# Patient Record
Sex: Female | Born: 1960
Health system: Southern US, Community
[De-identification: ages and names within clinical notes are randomized; demographics above are authoritative.]

## PROBLEM LIST (undated history)

## (undated) DIAGNOSIS — R008 Other abnormalities of heart beat: Secondary | ICD-10-CM

## (undated) DIAGNOSIS — J302 Other seasonal allergic rhinitis: Secondary | ICD-10-CM

## (undated) DIAGNOSIS — R232 Flushing: Secondary | ICD-10-CM

## (undated) DIAGNOSIS — L900 Lichen sclerosus et atrophicus: Secondary | ICD-10-CM

## (undated) DIAGNOSIS — Z8489 Family history of other specified conditions: Secondary | ICD-10-CM

## (undated) DIAGNOSIS — Z9109 Other allergy status, other than to drugs and biological substances: Secondary | ICD-10-CM

## (undated) DIAGNOSIS — R112 Nausea with vomiting, unspecified: Secondary | ICD-10-CM

## (undated) DIAGNOSIS — R7303 Prediabetes: Secondary | ICD-10-CM

## (undated) DIAGNOSIS — E663 Overweight: Secondary | ICD-10-CM

## (undated) DIAGNOSIS — Z8719 Personal history of other diseases of the digestive system: Secondary | ICD-10-CM

## (undated) DIAGNOSIS — D179 Benign lipomatous neoplasm, unspecified: Secondary | ICD-10-CM

## (undated) DIAGNOSIS — G43909 Migraine, unspecified, not intractable, without status migrainosus: Secondary | ICD-10-CM

## (undated) DIAGNOSIS — J349 Unspecified disorder of nose and nasal sinuses: Secondary | ICD-10-CM

## (undated) DIAGNOSIS — R51 Headache: Secondary | ICD-10-CM

## (undated) DIAGNOSIS — L921 Necrobiosis lipoidica, not elsewhere classified: Secondary | ICD-10-CM

## (undated) DIAGNOSIS — E559 Vitamin D deficiency, unspecified: Secondary | ICD-10-CM

## (undated) DIAGNOSIS — T148XXA Other injury of unspecified body region, initial encounter: Secondary | ICD-10-CM

## (undated) DIAGNOSIS — I509 Heart failure, unspecified: Secondary | ICD-10-CM

## (undated) DIAGNOSIS — R102 Pelvic and perineal pain: Secondary | ICD-10-CM

## (undated) DIAGNOSIS — I499 Cardiac arrhythmia, unspecified: Secondary | ICD-10-CM

## (undated) DIAGNOSIS — M2550 Pain in unspecified joint: Secondary | ICD-10-CM

## (undated) DIAGNOSIS — H9190 Unspecified hearing loss, unspecified ear: Secondary | ICD-10-CM

## (undated) DIAGNOSIS — Z9889 Other specified postprocedural states: Secondary | ICD-10-CM

## (undated) DIAGNOSIS — M6530 Trigger finger, unspecified finger: Secondary | ICD-10-CM

## (undated) DIAGNOSIS — N920 Excessive and frequent menstruation with regular cycle: Secondary | ICD-10-CM

## (undated) DIAGNOSIS — K760 Fatty (change of) liver, not elsewhere classified: Secondary | ICD-10-CM

## (undated) DIAGNOSIS — M1990 Unspecified osteoarthritis, unspecified site: Secondary | ICD-10-CM

## (undated) HISTORY — PX: HYSTEROSCOPY: SHX211

## (undated) HISTORY — DX: Fatty (change of) liver, not elsewhere classified: K76.0

## (undated) HISTORY — PX: FUNCTIONAL ENDOSCOPIC SINUS SURGERY: SUR616

## (undated) HISTORY — DX: Heart failure, unspecified: I50.9

## (undated) HISTORY — DX: Flushing: R23.2

## (undated) HISTORY — DX: Lichen sclerosus et atrophicus: L90.0

## (undated) HISTORY — DX: Unspecified disorder of nose and nasal sinuses: J34.9

## (undated) HISTORY — DX: Other abnormalities of heart beat: R00.8

## (undated) HISTORY — DX: Pain in unspecified joint: M25.50

## (undated) HISTORY — DX: Vitamin D deficiency, unspecified: E55.9

## (undated) HISTORY — DX: Benign lipomatous neoplasm, unspecified: D17.9

## (undated) HISTORY — PX: TRIGGER FINGER RELEASE: SHX641

## (undated) HISTORY — PX: OTHER SURGICAL HISTORY: SHX169

## (undated) HISTORY — DX: Prediabetes: R73.03

## (undated) HISTORY — DX: Necrobiosis lipoidica, not elsewhere classified: L92.1

## (undated) HISTORY — PX: LASIK: SHX215

## (undated) HISTORY — DX: Trigger finger, unspecified finger: M65.30

## (undated) HISTORY — DX: Unspecified hearing loss, unspecified ear: H91.90

## (undated) HISTORY — DX: Overweight: E66.3

## (undated) HISTORY — DX: Migraine, unspecified, not intractable, without status migrainosus: G43.909

## (undated) HISTORY — PX: CHOLECYSTECTOMY: SHX55

## (undated) HISTORY — PX: APPENDECTOMY: SHX54

## (undated) HISTORY — DX: Unspecified osteoarthritis, unspecified site: M19.90

---

## 1998-01-11 ENCOUNTER — Other Ambulatory Visit: Admission: RE | Admit: 1998-01-11 | Discharge: 1998-01-11 | Payer: Self-pay | Admitting: Obstetrics and Gynecology

## 1999-08-10 ENCOUNTER — Encounter: Payer: Self-pay | Admitting: Obstetrics and Gynecology

## 1999-08-10 ENCOUNTER — Ambulatory Visit (HOSPITAL_COMMUNITY): Admission: RE | Admit: 1999-08-10 | Discharge: 1999-08-10 | Payer: Self-pay | Admitting: Obstetrics and Gynecology

## 1999-12-28 ENCOUNTER — Other Ambulatory Visit: Admission: RE | Admit: 1999-12-28 | Discharge: 1999-12-28 | Payer: Self-pay | Admitting: Obstetrics and Gynecology

## 2000-12-05 ENCOUNTER — Encounter: Payer: Self-pay | Admitting: Obstetrics and Gynecology

## 2000-12-05 ENCOUNTER — Ambulatory Visit (HOSPITAL_COMMUNITY): Admission: RE | Admit: 2000-12-05 | Discharge: 2000-12-05 | Payer: Self-pay | Admitting: Obstetrics and Gynecology

## 2000-12-30 ENCOUNTER — Other Ambulatory Visit: Admission: RE | Admit: 2000-12-30 | Discharge: 2000-12-30 | Payer: Self-pay | Admitting: Obstetrics and Gynecology

## 2001-12-16 ENCOUNTER — Encounter: Payer: Self-pay | Admitting: Obstetrics and Gynecology

## 2001-12-16 ENCOUNTER — Ambulatory Visit (HOSPITAL_COMMUNITY): Admission: RE | Admit: 2001-12-16 | Discharge: 2001-12-16 | Payer: Self-pay | Admitting: Obstetrics and Gynecology

## 2001-12-30 ENCOUNTER — Other Ambulatory Visit: Admission: RE | Admit: 2001-12-30 | Discharge: 2001-12-30 | Payer: Self-pay | Admitting: Obstetrics and Gynecology

## 2002-12-21 ENCOUNTER — Ambulatory Visit (HOSPITAL_COMMUNITY): Admission: RE | Admit: 2002-12-21 | Discharge: 2002-12-21 | Payer: Self-pay | Admitting: Obstetrics and Gynecology

## 2003-01-01 ENCOUNTER — Other Ambulatory Visit: Admission: RE | Admit: 2003-01-01 | Discharge: 2003-01-01 | Payer: Self-pay | Admitting: Obstetrics and Gynecology

## 2003-12-22 ENCOUNTER — Ambulatory Visit (HOSPITAL_COMMUNITY): Admission: RE | Admit: 2003-12-22 | Discharge: 2003-12-22 | Payer: Self-pay | Admitting: Obstetrics and Gynecology

## 2004-01-03 ENCOUNTER — Other Ambulatory Visit: Admission: RE | Admit: 2004-01-03 | Discharge: 2004-01-03 | Payer: Self-pay | Admitting: Obstetrics and Gynecology

## 2004-12-25 ENCOUNTER — Ambulatory Visit (HOSPITAL_COMMUNITY): Admission: RE | Admit: 2004-12-25 | Discharge: 2004-12-25 | Payer: Self-pay | Admitting: Obstetrics and Gynecology

## 2005-01-03 ENCOUNTER — Other Ambulatory Visit: Admission: RE | Admit: 2005-01-03 | Discharge: 2005-01-03 | Payer: Self-pay | Admitting: Obstetrics and Gynecology

## 2005-12-31 ENCOUNTER — Ambulatory Visit (HOSPITAL_COMMUNITY): Admission: RE | Admit: 2005-12-31 | Discharge: 2005-12-31 | Payer: Self-pay | Admitting: Obstetrics and Gynecology

## 2006-01-16 ENCOUNTER — Other Ambulatory Visit: Admission: RE | Admit: 2006-01-16 | Discharge: 2006-01-16 | Payer: Self-pay | Admitting: Obstetrics & Gynecology

## 2006-02-07 ENCOUNTER — Encounter: Admission: RE | Admit: 2006-02-07 | Discharge: 2006-02-07 | Payer: Self-pay | Admitting: Orthopedic Surgery

## 2006-02-07 IMAGING — RF DG FLUORO GUIDE NDL PLC/BX
2 series · 2 of 2 positions shown · IV contrast (gadolinium)
Comparison: none

CLINICAL DATA: Right hip pain.
 FLUOROSCOPICALLY GUIDED NEEDLE PLACEMENT FOR MR ARTHROGRAPHY, RIGHT HIP:
 Following informed consent, sterile preparation of the groin area, and adequate local anesthesia using 1% Lidocaine, a 22 gauge spinal needle was placed into the right femoral neck under direct fluoroscopic guidance.  A [DATE] mixture of gadolinium (Magnevist) along with ADAMSE and saline were injected into the right hip joint.  Post procedure, the patient was comfortable.  
 The patient was escorted to the MR suite in a wheelchair for further imaging.

[Series 1: (hospital) · 1 of 1 slices shown]
[im 1/1]
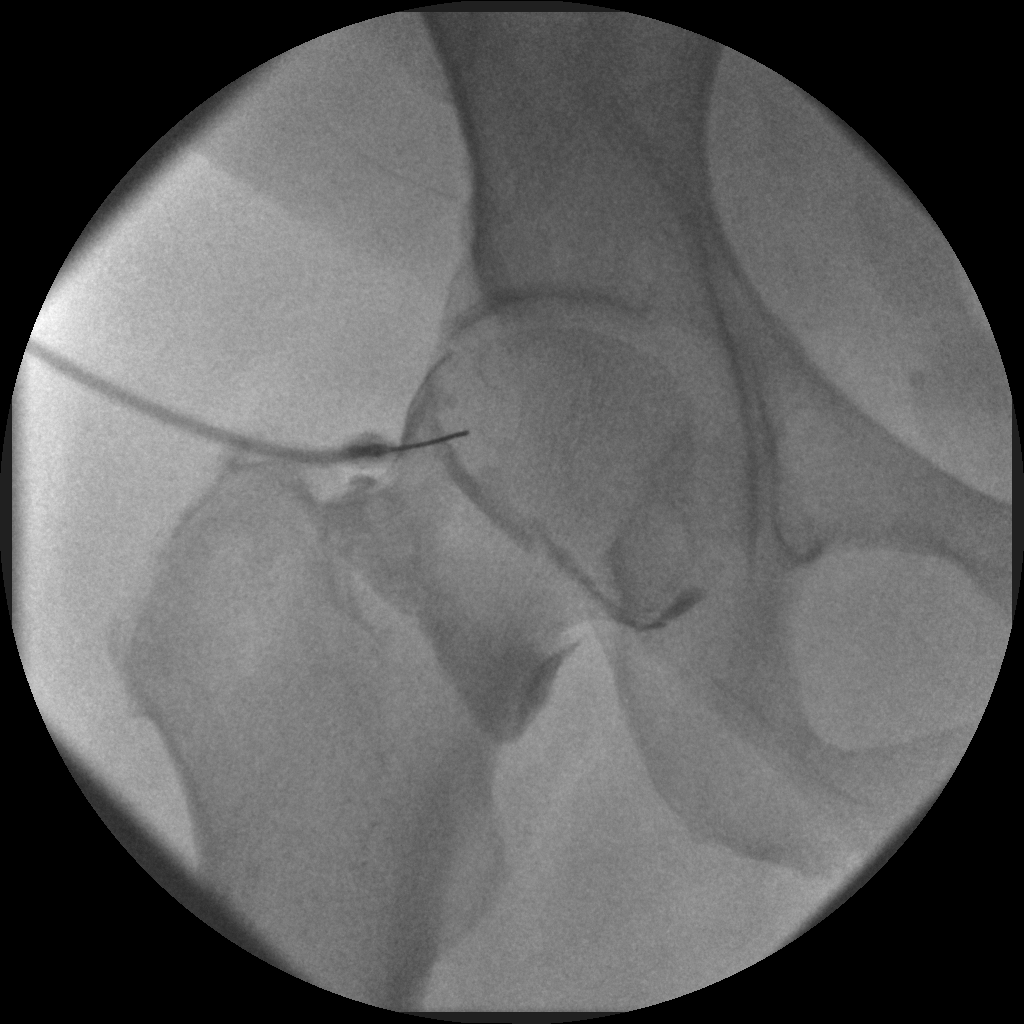

[Series 2: arthrogram · 1 of 1 slices shown]
[im 1/1]
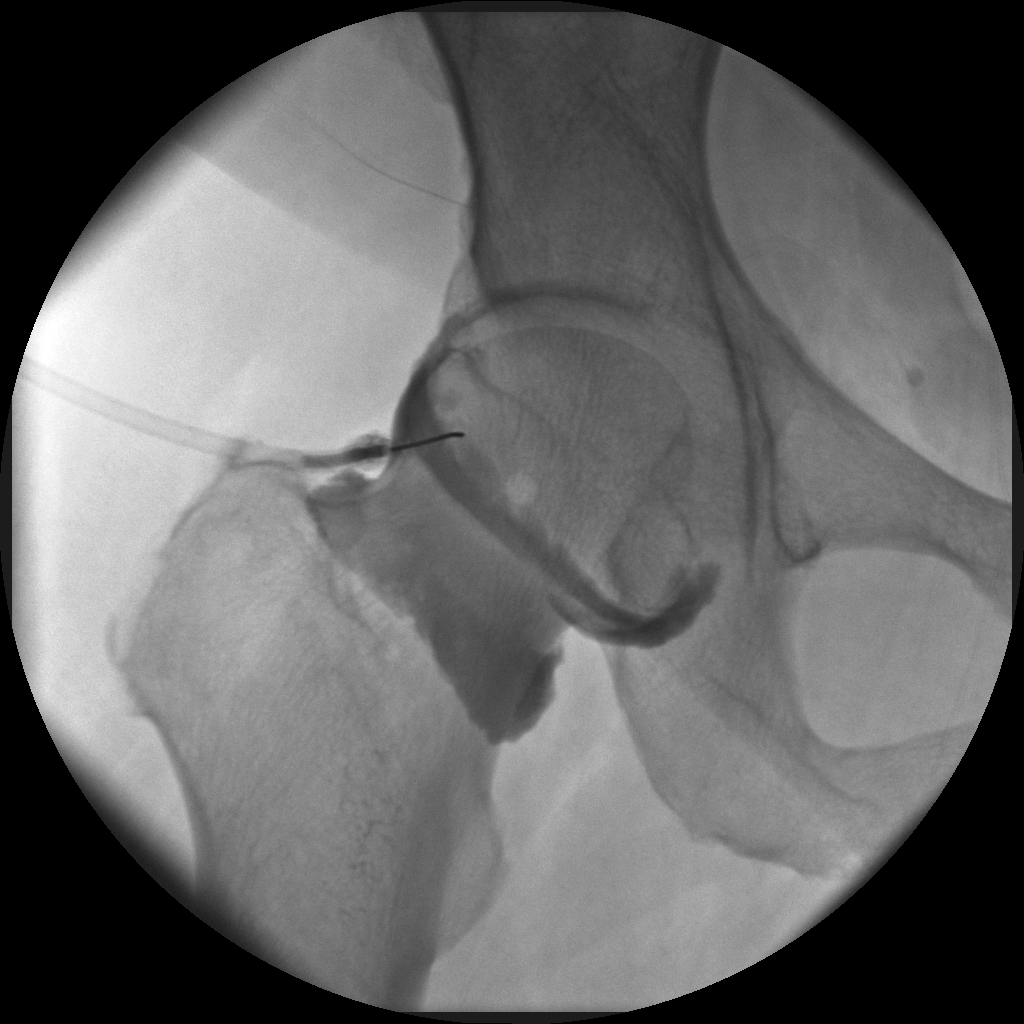

[2 of 2 positions shown; findings below may reference images not displayed]

IMPRESSION: Technically successful fluoroscopically guided needle placement for MR arthrography of the hip  with instillation of a [DATE] dilution of gadolinium.

## 2006-08-13 HISTORY — PX: DIAGNOSTIC LAPAROSCOPY: SUR761

## 2006-08-20 ENCOUNTER — Ambulatory Visit (HOSPITAL_BASED_OUTPATIENT_CLINIC_OR_DEPARTMENT_OTHER): Admission: RE | Admit: 2006-08-20 | Discharge: 2006-08-20 | Payer: Self-pay | Admitting: Obstetrics & Gynecology

## 2007-01-06 ENCOUNTER — Ambulatory Visit (HOSPITAL_COMMUNITY): Admission: RE | Admit: 2007-01-06 | Discharge: 2007-01-06 | Payer: Self-pay | Admitting: Obstetrics and Gynecology

## 2007-01-22 ENCOUNTER — Encounter: Admission: RE | Admit: 2007-01-22 | Discharge: 2007-01-22 | Payer: Self-pay | Admitting: Obstetrics and Gynecology

## 2007-05-08 ENCOUNTER — Other Ambulatory Visit: Admission: RE | Admit: 2007-05-08 | Discharge: 2007-05-08 | Payer: Self-pay | Admitting: Obstetrics and Gynecology

## 2007-07-14 ENCOUNTER — Encounter: Admission: RE | Admit: 2007-07-14 | Discharge: 2007-07-14 | Payer: Self-pay | Admitting: Obstetrics & Gynecology

## 2008-01-30 ENCOUNTER — Encounter: Admission: RE | Admit: 2008-01-30 | Discharge: 2008-01-30 | Payer: Self-pay | Admitting: Obstetrics & Gynecology

## 2008-05-13 ENCOUNTER — Other Ambulatory Visit: Admission: RE | Admit: 2008-05-13 | Discharge: 2008-05-13 | Payer: Self-pay | Admitting: Obstetrics & Gynecology

## 2009-01-31 ENCOUNTER — Encounter: Admission: RE | Admit: 2009-01-31 | Discharge: 2009-01-31 | Payer: Self-pay | Admitting: Obstetrics & Gynecology

## 2009-02-12 HISTORY — PX: DILATION AND CURETTAGE OF UTERUS: SHX78

## 2009-08-05 ENCOUNTER — Ambulatory Visit (HOSPITAL_COMMUNITY): Admission: RE | Admit: 2009-08-05 | Discharge: 2009-08-05 | Payer: Self-pay | Admitting: Obstetrics & Gynecology

## 2010-02-01 ENCOUNTER — Encounter
Admission: RE | Admit: 2010-02-01 | Discharge: 2010-02-01 | Payer: Self-pay | Source: Home / Self Care | Attending: Obstetrics & Gynecology | Admitting: Obstetrics & Gynecology

## 2010-02-15 ENCOUNTER — Encounter
Admission: RE | Admit: 2010-02-15 | Discharge: 2010-02-15 | Payer: Self-pay | Source: Home / Self Care | Attending: Obstetrics & Gynecology | Admitting: Obstetrics & Gynecology

## 2010-03-05 ENCOUNTER — Encounter: Payer: Self-pay | Admitting: Obstetrics and Gynecology

## 2010-03-15 ENCOUNTER — Encounter: Payer: Self-pay | Admitting: Obstetrics & Gynecology

## 2010-04-30 LAB — DIFFERENTIAL
Eosinophils Absolute: 0.2 10*3/uL (ref 0.0–0.7)
Eosinophils Relative: 2 % (ref 0–5)
Lymphocytes Relative: 23 % (ref 12–46)
Monocytes Absolute: 0.6 10*3/uL (ref 0.1–1.0)
Neutro Abs: 6.8 10*3/uL (ref 1.7–7.7)

## 2010-04-30 LAB — CBC
MCHC: 33.7 g/dL (ref 30.0–36.0)
MCV: 91.3 fL (ref 78.0–100.0)
Platelets: 259 10*3/uL (ref 150–400)
RDW: 13.8 % (ref 11.5–15.5)
WBC: 9.9 10*3/uL (ref 4.0–10.5)

## 2010-06-27 NOTE — Op Note (Signed)
NAME:  Savannah [name], Savannah [name]               ACCOUNT NO.:  [id number]   MEDICAL RECORD NO.:  [id number]          PATIENT TYPE:  AMB   LOCATION:  [facility]                         FACILITY:  [name]   PHYSICIAN:  M. Leda Quail, MD  DATE OF BIRTH:  [birth year]   DATE OF PROCEDURE:  08/20/2006  DATE OF DISCHARGE:                               OPERATIVE REPORT   PREOPERATIVE DIAGNOSIS:  60. 50 year old G-0 married white female with chronic right lower      quadrant pain/hip pain with negative orthopedic evaluation.  2. History of migraine with aura.  3. History of appendectomy.   POSTOPERATIVE DIAGNOSIS:  71. 50 year old G0 married white female with chronic right lower      quadrant pain/hip pain with negative orthopedic evaluation.  2. History of migraine with aura.  3. History of appendectomy.   PROCEDURE:  Diagnostic laparoscopy.   SURGEON:  M. Leda Quail, M.D.   ASSISTANT:  OR staff.   ANESTHESIA:  General endotracheal.   SPECIMENS:  None.   ESTIMATED BLOOD LOSS:  Minimal.   URINE OUTPUT:  50 mL drained with in and out catheterization at the  beginning of the procedure.   FLUIDS:  1000 mL of LR.   COMPLICATIONS:  None.   INDICATIONS:  Savannah [name] is a 50 year old G-0 married white female who  has had right lower quadrant and questionable hip pain for over a year.  She has undergone evaluation with Dr. Madelon Lips at [facility].  She has had x-rays which were negative and has been on anti-  inflammatories without improvement.  She then underwent a magnetic  resonance arthrogram of the right hip which was negative, as well.  She  and I initially talked about doing an ultrasound for further evaluation  and after the orthopedic evaluation did not really improve her pain, she  underwent ultrasound which showed an 8 x 4 x 5 cm uterus with a small  cervical fibroid and normal looking ovaries.  She came back in about  three months and had repeat sonogram with more difficulty  seeing the  cervical fibroid this time.  The endometrium was normal. Ovaries  appeared unchanged.  At that time, we talked about whether to proceed  with diagnostic laparoscopy.  As she really has no other avenues at this  point for evaluation, she has decided to proceed with this.   DESCRIPTION OF PROCEDURE:  The patient was taken to the operating room.  She was placed in supine position.  General endotracheal anesthesia was  administered by the anesthesia staff without difficulty.  SCDs were  placed on the lower extremities bilaterally.  Her legs were positioned  in the low lithotomy position in the [place] stirrups.  The abdomen,  perineum, inner thighs, and vagina were prepped in a normal sterile  fashion.  A red rubber Foley catheter was used to drain the bladder of  all urine.  A bivalve speculum was placed in the vagina and the anterior  lip of the cervix was grasped with a single tooth tenaculum.  A Hulka  clamp was passed through the cervical os and  attached to the cervix as a  means of manipulating the uterus during the procedure.  The tenaculum  was removed and the speculum was removed.  The patient is then draped in  a normal sterile fashion.  5 mL of 0.25% Marcaine without epinephrine  are instilled at the umbilicus.   A 10 mm skin incision is made with the knife and the subcutaneous fat  and tissue were dissected with a hemostat.  A long Veress needle was  obtained.  The abdomen is elevated and the Veress needle was aimed  toward the pelvis.  The patient does have quite a bit of subcutaneous  fatty tissue.  When what felt like was the peritoneum was popped  through, fluid was attached to the Veress needle, it was injected and  aspirated without any findings and then fluid did drip slowly into the  Veress needle.  The gas was attached to the Veress needle on low flow  but there were pressures of 12 to 15 mmHg.  The flow went in very  slowly.  This did not seem like I was in  the right place, therefore, the  Veress needle was removed.  An attempt was made to pass the Veress  needle again.  When I was at about the same location as before with the  Veress needle, which again felt like I had popped through the  peritoneum, the syringe was obtained.  Again, it was aspirated and, at  this point there was some fluid that was in there.  I felt it was  probably fluid that was in the subcu tissue, but I felt uncomfortable  proceeding with the Veress needle, at this point.   A 5 mm laparoscope with an OptiVu port was obtained.  The abdominal wall  was elevated by the operator's assistant and the OptiVu port, just like  the Veress needle, was aimed toward the pelvis below the level of the  sacral promontory.  With a twisting motion this passed through the  abdominal wall layer.  Once the preperitoneal fat and peritoneum were  passed through, intra-abdominal placement was confirmed.  The CO2 gas  was attached and initially on low flow to ensure that the insufflation  was occurring correctly.  Pressures were good and the CO2 gas flowed in  appropriately.  Therefore, it was turned to high flow.  Once about 2.5  liters of gas were in the abdomen, the patient was placed in some  Trendelenburg.  The pelvis was surveyed.   The uterus was elevated.  There was no endometriosis behind the uterus  or around the bladder.  The fallopian tubes could be well visualized.  The ovaries could not be visualized very well because they are sitting  behind the fallopian tubes.  What appeared to clearly be an appendix was  noted; however, the patient has had an appendectomy in the past.  The  liver edge using and stomach edge appeared normal.  There was no  evidence of endometriosis.  There was no bleeding anywhere in the  pelvis.  There was no fluid in the pelvis.  Because the ovaries could  not be well visualized, a decision was made to put a left lower quadrant  port.  The abdominal wall  layer was transilluminated to ensure there was  no blood vessels present.  3 mL of 0.25% Marcaine were instilled and a 5  mm skin incision was made.  Using a non-bladed trocar, again aiming  toward the pelvis, a  left lower quadrant port was placed with direct  visualization of the laparoscope.  The trocar was removed and a blunt  probe was obtained.  The ovaries were easily lifted up and the tissue  behind them could be well seen.  The ovaries were freely mobile and  there were no adhesions.  Photo documentation of both ovaries was  obtained.   At this point, there was no other aspect of the laparoscopy that needed  to be performed.  The ovaries, fallopian tubes, uterus, uterosacral  ligaments, bladder, liver edge, and stomach all appeared normal.  Photos  were obtained of all this.  At this point, the patient was placed back  in the supine position and relieved from the Trendelenburg positioning.  The left lower quadrant port was removed.  The laparoscope was used to  visualize this incision.  No bleeding was noted.  At this point, the  laparoscope was removed and the pneumoperitoneum was released.  Then,  the midline port was removed after the anesthesiologist gave several  breaths and the abdomen was pressed to try and get all the gas to escape  from it.   The incisions were cleansed.  The midline incision was closed at the  skin layer with a subcuticular stitch of 4-0 Vicryl and the incision in  the left lower quadrant was closed with Dermabond.  Dermabond was placed  on the midline incision to keep it air tight.  The Hulka clamp was  removed from the vagina.  No active bleeding was noted.  The skin was  cleansed with Betadine and she was positioned back in the supine  position and awakened from anesthesia out difficulty.  Sponge, lap,  needle, and instrument counts were correct x2.      Lum Keas, MD  Electronically Signed     MSM/MEDQ  D:  08/20/2006  T:   08/20/2006  Job:  [id number]

## 2010-10-11 ENCOUNTER — Encounter (HOSPITAL_COMMUNITY): Payer: Self-pay | Admitting: Obstetrics & Gynecology

## 2010-10-11 DIAGNOSIS — R102 Pelvic and perineal pain: Secondary | ICD-10-CM | POA: Insufficient documentation

## 2010-10-11 DIAGNOSIS — N920 Excessive and frequent menstruation with regular cycle: Secondary | ICD-10-CM | POA: Insufficient documentation

## 2010-10-25 DIAGNOSIS — T148XXA Other injury of unspecified body region, initial encounter: Secondary | ICD-10-CM

## 2010-10-25 HISTORY — DX: Other injury of unspecified body region, initial encounter: T14.8XXA

## 2010-10-31 NOTE — Patient Instructions (Addendum)
   Your procedure is scheduled on: Monday  Enter through the Main Entrance of [facility] at: 6am Pick up the phone at the desk and dial [phone]  Please call this number if you have any problems the morning of surgery: [phone]  Remember: Do not eat food after midnight  Do not drink clear liquids after:midnight Take these medicines the morning of surgery with a SIP OF WATER:pain meds if needed  Do not wear jewelry, make-up, or FINGER nail polish Do not wear lotions, powders, or perfumes.no deodorants Do not shave 48 hours prior to surgery. Do not bring valuables to the hospital. Leave suitcase in the car. After Surgery it may be brought to your room. For patients being admitted to the hospital, checkout time is 11:00am the day of discharge.  Patients discharged on the day of surgery will not be allowed to drive home.   Name and phone number of your driver:husband-Paul [phone]   Remember to use your hibiclens as instructed.

## 2010-11-02 ENCOUNTER — Encounter (HOSPITAL_COMMUNITY)
Admission: RE | Admit: 2010-11-02 | Discharge: 2010-11-02 | Disposition: A | Discharge status: Home / Self Care | Payer: BC Managed Care – PPO | Source: Ambulatory Visit | Attending: Obstetrics & Gynecology | Admitting: Obstetrics & Gynecology

## 2010-11-02 ENCOUNTER — Encounter (HOSPITAL_COMMUNITY): Payer: Self-pay

## 2010-11-02 HISTORY — DX: Other seasonal allergic rhinitis: J30.2

## 2010-11-02 HISTORY — DX: Headache: R51

## 2010-11-02 HISTORY — DX: Other allergy status, other than to drugs and biological substances: Z91.09

## 2010-11-02 HISTORY — DX: Unspecified osteoarthritis, unspecified site: M19.90

## 2010-11-02 HISTORY — DX: Other specified postprocedural states: Z98.890

## 2010-11-02 HISTORY — DX: Nausea with vomiting, unspecified: R11.2

## 2010-11-02 LAB — CBC
HCT: 41.1 % (ref 36.0–46.0)
Hemoglobin: 13.5 g/dL (ref 12.0–15.0)
MCH: 29.6 pg (ref 26.0–34.0)
RBC: 4.56 MIL/uL (ref 3.87–5.11)

## 2010-11-02 LAB — SURGICAL PCR SCREEN: MRSA, PCR: NEGATIVE

## 2010-11-05 ENCOUNTER — Encounter (HOSPITAL_COMMUNITY): Payer: Self-pay | Admitting: Anesthesiology

## 2010-11-05 MED ORDER — CEFAZOLIN SODIUM-DEXTROSE 2-3 GM-% IV SOLR
2.0000 g | INTRAVENOUS | Status: AC
  Administered 2010-11-06: 2 g via INTRAVENOUS
  Filled 2010-11-05: qty 50

## 2010-11-06 ENCOUNTER — Ambulatory Visit (HOSPITAL_COMMUNITY): Payer: BC Managed Care – PPO | Admitting: Anesthesiology

## 2010-11-06 ENCOUNTER — Encounter (HOSPITAL_COMMUNITY)
Admission: RE | Disposition: A | Discharge status: Home / Self Care | Payer: Self-pay | Source: Ambulatory Visit | Attending: Obstetrics & Gynecology

## 2010-11-06 ENCOUNTER — Ambulatory Visit (HOSPITAL_COMMUNITY)
Admission: RE | Admit: 2010-11-06 | Discharge: 2010-11-06 | Disposition: A | Discharge status: Home / Self Care | Payer: BC Managed Care – PPO | Source: Ambulatory Visit | Attending: Obstetrics & Gynecology | Admitting: Obstetrics & Gynecology

## 2010-11-06 ENCOUNTER — Encounter (HOSPITAL_COMMUNITY): Payer: Self-pay

## 2010-11-06 ENCOUNTER — Encounter (HOSPITAL_COMMUNITY): Payer: Self-pay | Admitting: Anesthesiology

## 2010-11-06 ENCOUNTER — Other Ambulatory Visit: Payer: Self-pay | Admitting: Obstetrics & Gynecology

## 2010-11-06 ENCOUNTER — Other Ambulatory Visit: Payer: Self-pay

## 2010-11-06 DIAGNOSIS — N8000 Endometriosis of the uterus, unspecified: Secondary | ICD-10-CM | POA: Insufficient documentation

## 2010-11-06 DIAGNOSIS — N8300 Follicular cyst of ovary, unspecified side: Secondary | ICD-10-CM | POA: Insufficient documentation

## 2010-11-06 DIAGNOSIS — N920 Excessive and frequent menstruation with regular cycle: Secondary | ICD-10-CM | POA: Insufficient documentation

## 2010-11-06 DIAGNOSIS — D252 Subserosal leiomyoma of uterus: Secondary | ICD-10-CM | POA: Insufficient documentation

## 2010-11-06 DIAGNOSIS — N946 Dysmenorrhea, unspecified: Secondary | ICD-10-CM | POA: Insufficient documentation

## 2010-11-06 DIAGNOSIS — Z01818 Encounter for other preprocedural examination: Secondary | ICD-10-CM | POA: Insufficient documentation

## 2010-11-06 DIAGNOSIS — Z01812 Encounter for preprocedural laboratory examination: Secondary | ICD-10-CM | POA: Insufficient documentation

## 2010-11-06 HISTORY — DX: Other injury of unspecified body region, initial encounter: T14.8XXA

## 2010-11-06 HISTORY — PX: SALPINGOOPHORECTOMY: SHX82

## 2010-11-06 HISTORY — DX: Pelvic and perineal pain: R10.2

## 2010-11-06 HISTORY — PX: ROBOTIC ASSISTED TOTAL HYSTERECTOMY: SHX6085

## 2010-11-06 HISTORY — DX: Excessive and frequent menstruation with regular cycle: N92.0

## 2010-11-06 LAB — COMPREHENSIVE METABOLIC PANEL
ALT: 18 U/L (ref 0–35)
AST: 20 U/L (ref 0–37)
CO2: 22 mEq/L (ref 19–32)
Chloride: 106 mEq/L (ref 96–112)
Creatinine, Ser: 0.77 mg/dL (ref 0.50–1.10)
GFR calc non Af Amer: >60 mL/min (ref >60)
Sodium: 136 mEq/L (ref 135–145)
Total Bilirubin: 0.5 mg/dL (ref 0.3–1.2)

## 2010-11-06 LAB — PREGNANCY, URINE: Preg Test, Ur: NEGATIVE

## 2010-11-06 LAB — HEMOGLOBIN AND HEMATOCRIT, BLOOD
HCT: 43.5 % (ref 36.0–46.0)
Hemoglobin: 14.6 g/dL (ref 12.0–15.0)

## 2010-11-06 SURGERY — ROBOTIC ASSISTED TOTAL HYSTERECTOMY
Anesthesia: General | Site: Uterus | Laterality: Right | Wound class: Clean Contaminated

## 2010-11-06 MED ORDER — GLYCOPYRROLATE 0.2 MG/ML IJ SOLN
INTRAMUSCULAR | Status: AC
  Filled 2010-11-06: qty 1

## 2010-11-06 MED ORDER — MENTHOL 3 MG MT LOZG: 1.0000 | LOZENGE | OROMUCOSAL | Status: DC | PRN

## 2010-11-06 MED ORDER — METOCLOPRAMIDE HCL 5 MG/ML IJ SOLN
INTRAMUSCULAR | Status: AC
  Administered 2010-11-06: 10 mg
  Filled 2010-11-06: qty 2

## 2010-11-06 MED ORDER — PROPOFOL 10 MG/ML IV EMUL
INTRAVENOUS | Status: AC
  Filled 2010-11-06: qty 20

## 2010-11-06 MED ORDER — ROPIVACAINE HCL 5 MG/ML IJ SOLN
INTRAMUSCULAR | Status: DC | PRN
  Administered 2010-11-06: 85 mL

## 2010-11-06 MED ORDER — LIDOCAINE HCL (CARDIAC) 20 MG/ML IV SOLN
INTRAVENOUS | Status: AC
  Filled 2010-11-06: qty 5

## 2010-11-06 MED ORDER — MIDAZOLAM HCL 5 MG/5ML IJ SOLN
INTRAMUSCULAR | Status: DC | PRN
  Administered 2010-11-06: 2 mg via INTRAVENOUS

## 2010-11-06 MED ORDER — PROMETHAZINE HCL 25 MG/ML IJ SOLN: 12.5000 mg | INTRAMUSCULAR | Status: DC | PRN

## 2010-11-06 MED ORDER — ROCURONIUM BROMIDE 50 MG/5ML IV SOLN
INTRAVENOUS | Status: AC
  Filled 2010-11-06: qty 1

## 2010-11-06 MED ORDER — OXYCODONE-ACETAMINOPHEN 5-325 MG PO TABS: 1.0000 | ORAL_TABLET | ORAL | Status: AC | PRN

## 2010-11-06 MED ORDER — EPHEDRINE SULFATE 50 MG/ML IJ SOLN
INTRAMUSCULAR | Status: DC | PRN
  Administered 2010-11-06: 10 mg via INTRAVENOUS
  Administered 2010-11-06 (×2): 5 mg via INTRAVENOUS

## 2010-11-06 MED ORDER — GLYCOPYRROLATE 0.2 MG/ML IJ SOLN
INTRAMUSCULAR | Status: DC | PRN
  Administered 2010-11-06: 1 mg via INTRAVENOUS

## 2010-11-06 MED ORDER — KETOROLAC TROMETHAMINE 30 MG/ML IJ SOLN
15.0000 mg | Freq: Once | INTRAMUSCULAR | Status: AC | PRN
  Administered 2010-11-06: 30 mg via INTRAVENOUS

## 2010-11-06 MED ORDER — LIDOCAINE HCL (CARDIAC) 20 MG/ML IV SOLN
INTRAVENOUS | Status: DC | PRN
  Administered 2010-11-06: 100 mg via INTRAVENOUS

## 2010-11-06 MED ORDER — FENTANYL CITRATE 0.05 MG/ML IJ SOLN: 25.0000 ug | INTRAMUSCULAR | Status: DC | PRN

## 2010-11-06 MED ORDER — KETOROLAC TROMETHAMINE 60 MG/2ML IM SOLN: INTRAMUSCULAR | Status: DC | PRN

## 2010-11-06 MED ORDER — MORPHINE SULFATE 4 MG/ML IJ SOLN: 1.0000 mg | INTRAMUSCULAR | Status: DC | PRN

## 2010-11-06 MED ORDER — ROCURONIUM BROMIDE 100 MG/10ML IV SOLN
INTRAVENOUS | Status: DC | PRN
  Administered 2010-11-06: 50 mg via INTRAVENOUS
  Administered 2010-11-06: 20 mg via INTRAVENOUS
  Administered 2010-11-06: 10 mg via INTRAVENOUS

## 2010-11-06 MED ORDER — ONDANSETRON HCL 4 MG/2ML IJ SOLN
INTRAMUSCULAR | Status: AC
  Filled 2010-11-06: qty 2

## 2010-11-06 MED ORDER — SCOPOLAMINE 1 MG/3DAYS TD PT72
1.0000 | MEDICATED_PATCH | Freq: Once | TRANSDERMAL | Status: DC
  Administered 2010-11-06: 1.5 mg via TRANSDERMAL

## 2010-11-06 MED ORDER — KETOROLAC TROMETHAMINE 30 MG/ML IJ SOLN
30.0000 mg | Freq: Four times a day (QID) | INTRAMUSCULAR | Status: DC
  Administered 2010-11-06: 30 mg via INTRAVENOUS
  Filled 2010-11-06: qty 1

## 2010-11-06 MED ORDER — DEXAMETHASONE SODIUM PHOSPHATE 4 MG/ML IJ SOLN
INTRAMUSCULAR | Status: DC | PRN
  Administered 2010-11-06: 10 mg via INTRAVENOUS

## 2010-11-06 MED ORDER — PANTOPRAZOLE SODIUM 40 MG IV SOLR
40.0000 mg | Freq: Every day | INTRAVENOUS | Status: DC
  Filled 2010-11-06: qty 40

## 2010-11-06 MED ORDER — NEOSTIGMINE METHYLSULFATE 1 MG/ML IJ SOLN
INTRAMUSCULAR | Status: AC
  Filled 2010-11-06: qty 10

## 2010-11-06 MED ORDER — ELETRIPTAN HYDROBROMIDE 40 MG PO TABS
40.0000 mg | ORAL_TABLET | Freq: Every day | ORAL | Status: DC | PRN
  Filled 2010-11-06: qty 1

## 2010-11-06 MED ORDER — LACTATED RINGERS IV SOLN: INTRAVENOUS | Status: DC | PRN

## 2010-11-06 MED ORDER — SIMETHICONE 80 MG PO CHEW: 80.0000 mg | CHEWABLE_TABLET | Freq: Four times a day (QID) | ORAL | Status: DC | PRN

## 2010-11-06 MED ORDER — DEXTROSE-NACL 5-0.45 % IV SOLN: INTRAVENOUS | Status: DC

## 2010-11-06 MED ORDER — FENTANYL CITRATE 0.05 MG/ML IJ SOLN
INTRAMUSCULAR | Status: AC
  Filled 2010-11-06: qty 5

## 2010-11-06 MED ORDER — TEMAZEPAM 15 MG PO CAPS: 15.0000 mg | ORAL_CAPSULE | Freq: Every evening | ORAL | Status: DC | PRN

## 2010-11-06 MED ORDER — LACTATED RINGERS IR SOLN
Status: DC | PRN
  Administered 2010-11-06: 3000 mL

## 2010-11-06 MED ORDER — KETOROLAC TROMETHAMINE 30 MG/ML IJ SOLN
INTRAMUSCULAR | Status: AC
  Filled 2010-11-06: qty 1

## 2010-11-06 MED ORDER — KETOROLAC TROMETHAMINE 30 MG/ML IJ SOLN: INTRAMUSCULAR | Status: DC | PRN

## 2010-11-06 MED ORDER — ACETAMINOPHEN 325 MG PO TABS: 650.0000 mg | ORAL_TABLET | ORAL | Status: DC | PRN

## 2010-11-06 MED ORDER — NEOSTIGMINE METHYLSULFATE 1 MG/ML IJ SOLN
INTRAMUSCULAR | Status: DC | PRN
  Administered 2010-11-06: 5 mg via INTRAMUSCULAR

## 2010-11-06 MED ORDER — FENTANYL CITRATE 0.05 MG/ML IJ SOLN
INTRAMUSCULAR | Status: DC | PRN
  Administered 2010-11-06: 150 ug via INTRAVENOUS
  Administered 2010-11-06 (×2): 50 ug via INTRAVENOUS
  Administered 2010-11-06: 100 ug via INTRAVENOUS
  Administered 2010-11-06: 50 ug via INTRAVENOUS
  Administered 2010-11-06: 100 ug via INTRAVENOUS

## 2010-11-06 MED ORDER — MIDAZOLAM HCL 2 MG/2ML IJ SOLN
INTRAMUSCULAR | Status: AC
  Filled 2010-11-06: qty 2

## 2010-11-06 MED ORDER — ACETAMINOPHEN 10 MG/ML IV SOLN
1000.0000 mg | Freq: Once | INTRAVENOUS | Status: DC | PRN
  Filled 2010-11-06: qty 100

## 2010-11-06 MED ORDER — CLOBETASOL PROPIONATE 0.05 % EX CREA: 1.0000 "application " | TOPICAL_CREAM | Freq: Two times a day (BID) | CUTANEOUS | Status: DC | PRN

## 2010-11-06 MED ORDER — LACTATED RINGERS IV SOLN
INTRAVENOUS | Status: DC
  Administered 2010-11-06 (×3): via INTRAVENOUS

## 2010-11-06 MED ORDER — PROPOFOL 10 MG/ML IV EMUL
INTRAVENOUS | Status: DC | PRN
  Administered 2010-11-06: 200 mg via INTRAVENOUS

## 2010-11-06 MED ORDER — OXYCODONE-ACETAMINOPHEN 5-325 MG PO TABS: 1.0000 | ORAL_TABLET | ORAL | Status: DC | PRN

## 2010-11-06 MED ORDER — ONDANSETRON HCL 4 MG/2ML IJ SOLN
INTRAMUSCULAR | Status: DC | PRN
  Administered 2010-11-06: 4 mg via INTRAVENOUS

## 2010-11-06 MED ORDER — SCOPOLAMINE 1 MG/3DAYS TD PT72
MEDICATED_PATCH | TRANSDERMAL | Status: AC
  Administered 2010-11-06: 1.5 mg via TRANSDERMAL
  Filled 2010-11-06: qty 1

## 2010-11-06 SURGICAL SUPPLY — 55 items
ADH SKN CLS APL DERMABOND .7 (GAUZE/BANDAGES/DRESSINGS) ×2
BARRIER ADHS 3X4 INTERCEED (GAUZE/BANDAGES/DRESSINGS) ×3 IMPLANT
BRR ADH 4X3 ABS CNTRL BYND (GAUZE/BANDAGES/DRESSINGS) ×2
CABLE HIGH FREQUENCY MONO STRZ (ELECTRODE) ×3 IMPLANT
CATH FOLEY 3WAY  5CC 16FR (CATHETERS) ×1
CATH FOLEY 3WAY 5CC 16FR (CATHETERS) ×2 IMPLANT
CLOTH BEACON ORANGE TIMEOUT ST (SAFETY) ×3 IMPLANT
CONT PATH 16OZ SNAP LID 3702 (MISCELLANEOUS) ×3 IMPLANT
COVER MAYO STAND STRL (DRAPES) ×3 IMPLANT
COVER TABLE BACK 60X90 (DRAPES) ×6 IMPLANT
COVER TIP SHEARS 8 DVNC (MISCELLANEOUS) ×2 IMPLANT
COVER TIP SHEARS 8MM DA VINCI (MISCELLANEOUS) ×1
DECANTER SPIKE VIAL GLASS SM (MISCELLANEOUS) ×3 IMPLANT
DERMABOND ADVANCED (GAUZE/BANDAGES/DRESSINGS) ×1
DERMABOND ADVANCED .7 DNX12 (GAUZE/BANDAGES/DRESSINGS) IMPLANT
DRAPE HUG U DISPOSABLE (DRAPE) ×3 IMPLANT
DRAPE LG THREE QUARTER DISP (DRAPES) ×6 IMPLANT
DRAPE MONITOR DA VINCI (DRAPE) ×3 IMPLANT
DRAPE WARM FLUID 44X44 (DRAPE) ×3 IMPLANT
ELECT REM PT RETURN 9FT ADLT (ELECTROSURGICAL) ×3
ELECTRODE REM PT RTRN 9FT ADLT (ELECTROSURGICAL) ×2 IMPLANT
EVACUATOR SMOKE 8.L (FILTER) ×3 IMPLANT
GLOVE BIOGEL PI IND STRL 7.0 (GLOVE) ×4 IMPLANT
GLOVE BIOGEL PI INDICATOR 7.0 (GLOVE) ×5
GLOVE ECLIPSE 6.5 STRL STRAW (GLOVE) ×11 IMPLANT
GOWN PREVENTION PLUS LG XLONG (DISPOSABLE) ×9 IMPLANT
GOWN STRL REIN XL XLG (GOWN DISPOSABLE) ×5 IMPLANT
KIT DISP ACCESSORY 4 ARM (KITS) ×3 IMPLANT
NDL INSUFFLATION 14GA 120MM (NEEDLE) ×2 IMPLANT
NEEDLE INSUFFLATION 14GA 120MM (NEEDLE) ×3 IMPLANT
NS IRRIG 1000ML POUR BTL (IV SOLUTION) ×9 IMPLANT
OCCLUDER COLPOPNEUMO (BALLOONS) IMPLANT
PACK LAVH (CUSTOM PROCEDURE TRAY) ×3 IMPLANT
PAD PREP 24X48 CUFFED NSTRL (MISCELLANEOUS) ×6 IMPLANT
PLUG CATH AND CAP STER (CATHETERS) ×3 IMPLANT
SET IRRIG TUBING LAPAROSCOPIC (IRRIGATION / IRRIGATOR) ×3 IMPLANT
SOLUTION ELECTROLUBE (MISCELLANEOUS) ×3 IMPLANT
SUT VIC AB 0 CT1 27 (SUTURE) ×15
SUT VIC AB 0 CT1 27XBRD ANBCTR (SUTURE) ×10 IMPLANT
SUT VICRYL 0 UR6 27IN ABS (SUTURE) ×4 IMPLANT
SUT VICRYL RAPIDE 4/0 PS 2 (SUTURE) ×6 IMPLANT
SYR 50ML LL SCALE MARK (SYRINGE) ×3 IMPLANT
TIP UTERINE 5.1X6CM LAV DISP (MISCELLANEOUS) IMPLANT
TIP UTERINE 6.7X10CM GRN DISP (MISCELLANEOUS) IMPLANT
TIP UTERINE 6.7X6CM WHT DISP (MISCELLANEOUS) IMPLANT
TIP UTERINE 6.7X8CM BLUE DISP (MISCELLANEOUS) IMPLANT
TOWEL OR 17X24 6PK STRL BLUE (TOWEL DISPOSABLE) ×6 IMPLANT
TRAY FOLEY BAG SILVER LF 14FR (CATHETERS) ×3 IMPLANT
TROCAR 12MM/512SD (ENDOMECHANICALS) IMPLANT
TROCAR DISP BLADELESS 8 DVNC (TROCAR) ×2 IMPLANT
TROCAR DISP BLADELESS 8MM (TROCAR) ×1
TROCAR XCEL 12X100 BLDLESS (ENDOMECHANICALS) ×3 IMPLANT
TROCAR Z-THREAD 12X150 (TROCAR) ×3 IMPLANT
TUBING FILTER THERMOFLATOR (ELECTROSURGICAL) ×3 IMPLANT
WARMER LAPAROSCOPE (MISCELLANEOUS) ×3 IMPLANT

## 2010-11-06 NOTE — Anesthesia Procedure Notes (Addendum)
Procedure Name: LMA Insertion Date/Time: 11/06/2010 7:44 AM Performed by: Karleen Dolphin Pre-anesthesia Checklist: Patient identified, Timeout performed, Emergency Drugs available, Suction available and Patient being monitored Patient Re-evaluated:Patient Re-evaluated prior to inductionOxygen Delivery Method: Circle System Utilized Preoxygenation: Pre-oxygenation with 100% oxygen Intubation Type: IV induction Ventilation: Mask ventilation without difficulty LMA: LMA inserted LMA Size: 4.0 Number of attempts: 1 Tube secured with: Tape Dental Injury: Teeth and Oropharynx as per pre-operative assessment    Procedure Name: Intubation Date/Time: 11/06/2010 7:47 AM Performed by: Isabella Bowens Pre-anesthesia Checklist: Patient identified, Patient being monitored, Emergency Drugs available, Timeout performed and Suction available Patient Re-evaluated:Patient Re-evaluated prior to inductionOxygen Delivery Method: Circle System Utilized Preoxygenation: Pre-oxygenation with 100% oxygen Intubation Type: IV induction Grade View: Grade III Tube type: Oral Tube size: 7.0 mm Number of attempts: 1 Airway Equipment and Method: stylet Placement Confirmation: positive ETCO2,  CO2 detector and breath sounds checked- equal and bilateral Secured at: 21 cm Tube secured with: Tape Dental Injury: Teeth and Oropharynx as per pre-operative assessment

## 2010-11-06 NOTE — Transfer of Care (Signed)
Immediate Anesthesia Transfer of Care Note  Patient: Savannah [name]  Procedure(s) Performed:  ROBOTIC ASSISTED TOTAL HYSTERECTOMY; SALPINGO OOPHERECTOMY  Patient Location: PACU  Anesthesia Type: General  Level of Consciousness: patient cooperative  Airway & Oxygen Therapy: Patient Spontanous Breathing and Patient connected to face mask oxygen  Post-op Assessment: Report given to PACU RN and Post -op Vital signs reviewed and stable  Post vital signs: Reviewed and stable  Complications: No apparent anesthesia complications

## 2010-11-06 NOTE — H&P (Signed)
Savannah [name] is an 50 y.o. female G0 with history of menorrhagia and dysmenorrhea.  She has been contemplating a hysterectomy for several years.  This year when seeing her at her annual exam, she reported being ready to proceed. She cannot take estrogen containing pills due to her migraines with aura and she has been unable to tolerate micronor due to intolerable side effects.  I attempted to perform and endometrial ablation a little over a year ago but could not dilate her cervix.  At that time, I felt I made a false track and could not proceed.  Due to scheduling, she could not proceed with hysterectomy but this year is ready.  PUS on 07/20/09 showed uterus 10.5 x 4.7 x 5.4 and she had a normal endometrial biopsy.  Risks and benefits have been discussed.  This is all documented in her office chart.    Pertinent Gynecological History: Menses: flow is excessive with use of several pads or tampons on heaviest days Bleeding: menorrhagi Contraception: condoms DES exposure: denies Blood transfusions: none Sexually transmitted diseases: no past history Previous GYN Procedures: attempted endometrial ablation  Last mammogram: normal Date: Jan 2012 Last pap: normal Date: July 2012 OB History: G0, P0   Menstrual History: Menarche age: 3 No LMP recorded.    Past Medical History  Diagnosis Date  . Menorrhagia   . Pelvic pain in female   . PONV (postoperative nausea and vomiting)     ponv, slow to awake  . Environmental allergies   . Seasonal allergies   . Headache     migraines-on meds for control, relpax, ketoprophen, vicoden, muscle relaxer  . Arthritis     bilateral knees, recent LCL sprain-on left knee  . Sprain 10/25/2010    left knee    Past Surgical History  Procedure Date  . Ganglion cyst removal   . Functional endoscopic sinus surgery   . Appendectomy   . Diagnostic laparoscopy   . Dilation and curettage of uterus 2011    attempted ablation  . Lasik     History reviewed.  No pertinent family history.  Social History:  reports that she has never smoked. She does not have any smokeless tobacco history on file. She reports that she does not drink alcohol or use illicit drugs.  Allergies:  Allergies  Allergen Reactions  . Dust Mite Extract Other (See Comments)    Environmental allergies.Marland KitchenMarland KitchenTakes allergy shots...Marland KitchenNKDA    Prescriptions prior to admission  Medication Sig Dispense Refill  . cetirizine (ZYRTEC) 10 MG tablet Take 10 mg by mouth daily as needed. For allergies       . clobetasol (TEMOVATE) 0.05 % cream Apply 1 application topically 2 (two) times daily as needed. For lichen planus       . eletriptan (RELPAX) 40 MG tablet One tablet by mouth as needed for migraine headache.  If the headache improves and then returns, dose may be repeated after 2 hours have elapsed since first dose (do not exceed 80 mg per day). may repeat in 2 hours if necessary       . HYDROcodone-acetaminophen (VICODIN) 5-500 MG per tablet Take 1 tablet by mouth every 4 (four) hours as needed. For pain       . ketoprofen (ORUDIS) 50 MG capsule Take 50 mg by mouth daily as needed. For migraines        . misoprostol (CYTOTEC) 100 MCG tablet Place 100 mcg vaginally daily. As directed by MD      .  traMADol (ULTRAM) 50 MG tablet Take 50 mg by mouth every 8 (eight) hours as needed. For pain       . chlorzoxazone (PARAFON) 500 MG tablet Take 500 mg by mouth daily as needed. For migraines       . meloxicam (MOBIC) 7.5 MG tablet Take 7.5 mg by mouth daily as needed. For Arthritis pain        Review of Systems  Constitutional: Positive for fever. Negative for chills.  Eyes: Negative for blurred vision.  Respiratory: Negative for cough and hemoptysis.   Cardiovascular: Negative for chest pain and palpitations.  Gastrointestinal: Negative for heartburn, nausea and vomiting.  Genitourinary: Negative for dysuria and urgency.  Musculoskeletal: Positive for myalgias.  Skin: Negative for rash.    Neurological: Negative for dizziness and headaches.  Endo/Heme/Allergies: Bruises/bleeds easily.  Psychiatric/Behavioral: Negative for depression.    Blood pressure 128/79, pulse 45, temperature 97.5 F (36.4 C), temperature source Oral, resp. rate 16, SpO2 96.00%. Physical Exam  Vitals reviewed. Constitutional: She is oriented to person, place, and time. She appears well-developed and well-nourished.  HENT:  Head: Normocephalic and atraumatic.  Eyes: Pupils are equal, round, and reactive to light.  Neck: Normal range of motion. Neck supple. No thyromegaly present.  Cardiovascular: Normal rate and regular rhythm.   Respiratory: Effort normal and breath sounds normal.  GI: Soft. Bowel sounds are normal. She exhibits no distension. There is no tenderness.  Genitourinary: Vagina normal and uterus normal.  Musculoskeletal: Normal range of motion.  Neurological: She is alert and oriented to person, place, and time.  Skin: Skin is warm and dry.    Results for orders placed during the hospital encounter of 11/06/10 (from the past 24 hour(s))  PREGNANCY, URINE     Status: Normal   Collection Time   11/06/10  6:18 AM      Component Value Range   Preg Test, Ur NEGATIVE      No results found.  Assessment/Plan: 50 yo G0 MWF with history of menorrhagia, failed endometrial ablation, inability to tolerate estrogen containing OCPs, progesterone only pills, and endometrial ablation here for definitive management with TLH/possible BSO.  Savannah [name],M SUZANNE 11/06/2010, 6:50 AM

## 2010-11-06 NOTE — Anesthesia Preprocedure Evaluation (Signed)
Anesthesia Evaluation  Name, MR# and DOB Patient awake  General Assessment Comment  Reviewed: Allergy & Precautions, H&P , NPO status , Patient's Chart, lab work & pertinent test results, reviewed documented beta blocker date and time   History of Anesthesia Complications (+) PONV  Airway Mallampati: II TM Distance: >3 FB Neck ROM: full  Mouth opening: Limited Mouth Opening  Dental  (+) Teeth Intact   Pulmonary  clear to auscultation  breath sounds clear to auscultation none    Cardiovascular Exercise Tolerance: Good regular Normal    Neuro/Psych   Headaches (migraines with menstrual cycle)   Negative Psych ROS  GI/Hepatic/Renal negative GI ROS  negative Liver ROS  negative Renal ROS        Endo/Other  Negative Endocrine ROS (+)      Abdominal   Musculoskeletal  (+) Arthritis - (bilateral knees),   Hematology negative hematology ROS (+)   Peds  Reproductive/Obstetrics (+) Pregnancy    Anesthesia Other Findings H/o PONV - usually responds to phenergan in PACU H/o prolonged sedation and resp depression - sensitive to pain meds - has never required reintubation            Anesthesia Physical Anesthesia Plan  ASA: II  Anesthesia Plan: Epidural   Post-op Pain Management:    Induction:   Airway Management Planned:   Additional Equipment:   Intra-op Plan:   Post-operative Plan:   Informed Consent: I have reviewed the patients History and Physical, chart, labs and discussed the procedure including the risks, benefits and alternatives for the proposed anesthesia with the patient or authorized representative who has indicated his/her understanding and acceptance.     Plan Discussed with: Surgeon and CRNA  Anesthesia Plan Comments:         Anesthesia Quick Evaluation

## 2010-11-06 NOTE — Progress Notes (Signed)
Day of Surgery Procedure(s): ROBOTIC ASSISTED TOTAL HYSTERECTOMY RIGHT SALPINGO OOPHERECTOMY  Subjective: Patient reports tolerating PO. Patient very comfortable.  No nausea.  Ambulating well.   Objective: I have reviewed patient's vital signs, intake and output and medications.  General: alert and cooperative Resp: clear to auscultation bilaterally Cardio: regular rate and rhythm, S1, S2 normal, no murmur, click, rub or gallop GI: soft, non-tender; bowel sounds normal; no masses,  no organomegaly and incision: clean, dry and intact Extremities: extremities normal, atraumatic, no cyanosis or edema Vaginal Bleeding: minimal  Assessment: s/p Procedure(s): ROBOTIC ASSISTED TOTAL HYSTERECTOMY SALPINGO OOPHERECTOMY: progressing well  Plan: Stat hemoglobin.  If stable, will plan D/C home.  LOS: 0 days    [name] Counterman,M SUZANNE 11/06/2010, 5:53 PM

## 2010-11-06 NOTE — Anesthesia Postprocedure Evaluation (Signed)
Anesthesia Post Note  Patient: Savannah [name]  Procedure(s) Performed:  ROBOTIC ASSISTED TOTAL HYSTERECTOMY; SALPINGO OOPHERECTOMY  Anesthesia type: General  Patient location: PACU  Post pain: Pain level controlled  Post assessment: Post-op Vital signs reviewed  Last Vitals:  Filed Vitals:   11/06/10 1115  BP: 111/58  Pulse: 89  Temp:   Resp: 18    Post vital signs: Reviewed  Level of consciousness: sedated  Complications: No apparent anesthesia complicationsfj

## 2010-11-06 NOTE — Op Note (Signed)
11/06/2010  10:08 AM  PATIENT:  Savannah [name]  50 y.o. female G0 with history of dysmenorrhea, and menorrhagia.  Inability to tolerate progesterone only pills due to side effects.  Failed endometrial ablation from 6/12.  Cannot take estrogen containing OCP's due to migraines with auras.  PRE-OPERATIVE DIAGNOSIS:  Dysmenorrhea & Menorrhagia  POST-OPERATIVE DIAGNOSIS:  Dysmenorrhea & Menorrhagia  PROCEDURE:  Procedure(s): ROBOTIC ASSISTED TOTAL HYSTERECTOMY RIGHT SALPINGO OOPHERECTOMY  SURGEON:  Mikyla Schachter,M SUZANNE  ASSISTANTS: ROMINE, CYNTHIA   ANESTHESIA:   general  ESTIMATED BLOOD LOSS: * No blood loss amount entered *  BLOOD ADMINISTERED:none   FLUIDS: 1400cc LR  UOP: 200cc clear urine  SPECIMEN:  Uterus, cervix, right tube and ovary  DISPOSITION OF SPECIMEN:  PATHOLOGY  FINDINGS: Normal pelvis, normal upper abdomen with normal liver edge and stomach edge  DESCRIPTION OF OPERATION: Patient taken to the operating room.  She was placed in the supine position.  Arms were tucked to the side while awake.  No discomfort noted.  General endotracheal anesthesia placed without difficulty.  Dr. Malen Gauze present for this portion.  Bean bag positioned with good fit around the patient's neck and shoulders.  Patient placed in Trendelenburg at this point and there was no movement of her on the table.  Trendelenburg relieved.  Time out performed.  Abdomen prepped with Clora-prep.  Inner thighs, perineum, and vagina prepped with Betadine x 3.    Legs lifted to the high lithotomy position.  Heavy weighted speculum placed in the vagina.  Deaver retractor used to visualize the cervix.  Anterior lip of cervix grasped with single toothed tenaculum.  Uterus sounded to 8cm.  RUMI uterine manipulator obtained.  A #8 disposable tip with obtained and attached to the RUMI uterine manipulator.  A vaginal occlusive device with placed and then a medium KOH ring.  The tip was passed through th endocervix into the  endometrial cavity.  4cc normal saline were used to inflate the bulb in the uterine cavity.  Tenaculum was removed.  Deaver and heavy weighted speculum was removed.  A foley catheter was placed to straight drain.  Legs lowered to low lithotomy position.  Attention was turned to the abdomen.  0.5% Ropivacaine (mixed 1:1 with normal saline) was used to anesthetize above the umbilicus.  Then with a #11 blade, a 10mm skin incision was made.  The subcutaneous fat and tissue was dissected.  A Veress needle was obtained.  The valve was open.  With elevation of the abdomen, the needle was passed into the abdomen.  Normal saline in a syringe was attached to the needle.  An aspiration was performed.  No blood or fluid was noted.  Saline was injected easily and another aspiration was performed.  No blood, fluid, or saline was noted.  Fluid dripped easily into the needle.  Then under low flows of C02 gas, pneumoperitoneum was achieved.  Once 3 L of gas was in the abdomen, the needle was removed.  Again with the abdomen elevated, a #12 disposable bladed trochar and port were placed directly into the abdomen.  The trochar was removed.  Correct placement was confirmed with the laparoscope.  Gas was attached and placed on high flow.  At this point 60cc of the Ropivicain mixture were placed in teh pelvis.  Then incision locations were marked on the skin--#1 was to be placed 10cm from the umbilicus to the right, #2 was 10cm to the left of the umbilicus, and #3 was marked for the RLQ (assistant's  port).  Skin was transilluminated to ensure no large vessels present.  Skin was anesthetized with the 0.5% Ropivacaine mixture and then incisions made with the #11 blade.  In the #1, and #2 ports, an 8mm non-disposable trochar and port were placed.  Then a #12 non-bladed disposable trochar and port was placed in the assistant's port.  All trochars were removed.  Patient was placed in Trendelenburg position with the legs at a 180 degree  angle to the body.  The Robot was side docked in standard fashion.  In #1, an endoscopic scissor with monopolar cautery attached was placed.  In #2, a PK [place] with bipolar cautery attached was placed.  Pelvis surveyed.  No abnormal findings noted except there was a 2-3 cm cyst on the right ovary.  The patient wanted on ovary removed so it was decided to take this one.  The ureter on the right and left were noted.  Using bipolar cauter, the right IP ligament was serially clamped, cauterized, and incised.  The sound indicator was used to ensure complete cautery of the pedicle.  Then the right round ligament was serially clamped, cauterized, and incised.  The anterior and posterior peritoneum was incised--posteriorly towards the uterosacral ligament and the anterior towards the midline just below the lower uterine segment.  The bladder flap was created without difficulty.  The right uterine artery was skeletonized.  At the level of the KOH ring, the right uterine artery was serially clamped, cauterized, and incised.    Attention was turned to the left and the same procedure was repeated except that the left ovary and tube were left and therefore the left utero-ovarian pedicle was serially clamped, cauterized, and incised.  Once the uterine artery on the left was completely incised (also at the level of the KOH ring), the uterus was devascularized.  The colpotomy was made with monopolar cautery, starting in the midline and working clock-wise around the cervix.  Once the uterus was free from the vaginal mucosa, it was delivered (with the right tube and ovary) through the vaginal opening.  The uterus was then used to maintain the pneumoperitoneum.  Next the vaginal mucosa was closed with figure of eight sutures of #0 Vicryl.  The anterior and posterior mucosa was incorporated into each stitch.  Once the cuff was closed, the pelvis was suctioned of all blood.  Then the pelvis was copiously irrigated with normal  saline.  No bleeding was noted.  All pedicles were hemostatic.  The ureters were again noted on each side, peristalsing.  Interceed was placed across the vaginal cuff.   The instruments were removed under direct visualization.  Then the robot was undocked and the patient taken out of Trendelenburg position.  All ports, except the umbilical port, were removed under direct visualization.  She was given several deep breaths to try and removed as much C02 before removing the umbilical port.    The umbilicus and right lower quadrant were closed at the fascial level with a figure of eight suture of #0 Vicryl.  Then a subcuticular stitch of #3.0 vicryl was used to close the skin.  The skin incisions were cleansed and Dermabond applied.  The specimen was removed from the vagina and a sponge stick was used to sweep the vagina.  Minimal blood was noted.  The foley catheter was removed.  Clear urine present.  Patient was placed back in supine position and she as awakened from anesthesia.  Sponge, lap, needle, and instrument counts were correct  x 2.  Patient tolerated the procedure well.  No complications present.  Patient taken to the recovery room in stable condition.  COUNTS:  YES  PLAN OF CARE: Transfer to PACU

## 2010-11-07 NOTE — Discharge Summary (Signed)
Physician Discharge Summary  Patient ID: Savannah [name] MRN: [id number] DOB/AGE: Aug 19, 1960 50 y.o.  Admit date: 11/06/2010 Discharge date: 11/06/2010  Admission Diagnoses:  Menorrhagia, Dysmenorrhea   Discharge Diagnoses:  Active Problems:  * No active hospital problems. *    Discharged Condition: good  Hospital Course: TLH/RSO, robotic assisted, performed.  Minimal blood loss.  Foley removed before patient left OR.  After appropriate time in PACU, patient transferred to the third floor.  Patient able to void without difficulty, advanced to regular diet, and able to ambulate without difficulty.  VSS/AF.  6hr post op Hb was 14.6.  Patient had essentially no post-op pain.  She was seen about 5:30pm day of surgery.  Exam was normal.  She had good bowel sounds.  She had minimal bleeding.  At this point, she was meeting all discharge criteria and she requested discharge.  I felt this was appropriate given her exam findings, vitals, post-op hemoglobin  Consults: none  Significant Diagnostic Studies: labs: post-op hb 14.6  Treatments: surgery: TLH/RSO, robotic assited  Discharge Exam: Blood pressure 129/62, pulse 91, temperature 97.7 F (36.5 C), temperature source Oral, resp. rate 16, SpO2 98.00%. see post op note  Disposition: Home or Self Care   Discharge Medication List as of 11/06/2010  7:27 PM    START taking these medications   Details  oxyCODONE-acetaminophen (PERCOCET) 5-325 MG per tablet Take 1-2 tablets by mouth every 4 (four) hours as needed for pain., Starting 11/06/2010, Until Thu 11/16/10, No Print      CONTINUE these medications which have NOT CHANGED   Details  cetirizine (ZYRTEC) 10 MG tablet Take 10 mg by mouth daily as needed. For allergies , Until Discontinued, Historical Med    clobetasol (TEMOVATE) 0.05 % cream Apply 1 application topically 2 (two) times daily as needed. For lichen planus , Until Discontinued, Historical Med    eletriptan (RELPAX) 40 MG tablet  One tablet by mouth as needed for migraine headache.  If the headache improves and then returns, dose may be repeated after 2 hours have elapsed since first dose (do not exceed 80 mg per day). may repeat in 2 hours if necessary , Until Discontinued, Hist orical Med    ketoprofen (ORUDIS) 50 MG capsule Take 50 mg by mouth daily as needed. For migraines  , Until Discontinued, Historical Med    traMADol (ULTRAM) 50 MG tablet Take 50 mg by mouth every 8 (eight) hours as needed. For pain , Until Discontinued, Historical Med    chlorzoxazone (PARAFON) 500 MG tablet Take 500 mg by mouth daily as needed. For migraines , Until Discontinued, Historical Med    meloxicam (MOBIC) 7.5 MG tablet Take 7.5 mg by mouth daily as needed. For Arthritis pain, Until Discontinued, Historical Med      STOP taking these medications     HYDROcodone-acetaminophen (VICODIN) 5-500 MG per tablet      misoprostol (CYTOTEC) 100 MCG tablet        Follow-up Information    Follow up with Ariq Khamis,M SUZANNE in 10 days. (My office will call [name] with her appointment)    Contact information:   [address], [address] [name] [address] [phone]          Signed: Lum Keas 11/07/2010, 11:39 AM

## 2010-11-13 ENCOUNTER — Encounter (HOSPITAL_COMMUNITY): Payer: Self-pay | Admitting: Obstetrics & Gynecology

## 2010-11-28 LAB — URINALYSIS, ROUTINE W REFLEX MICROSCOPIC
Glucose, UA: NEGATIVE
Specific Gravity, Urine: 1.014

## 2010-11-28 LAB — CBC
HCT: 38.7
Hemoglobin: 13.2
MCV: 87.8
WBC: 9.9

## 2010-11-28 LAB — BASIC METABOLIC PANEL
Chloride: 108
GFR calc non Af Amer: >60
Glucose, Bld: 104 — ABNORMAL HIGH
Potassium: 3.8
Sodium: 137

## 2010-11-28 LAB — URINE MICROSCOPIC-ADD ON: RBC / HPF: NONE SEEN

## 2011-01-18 ENCOUNTER — Other Ambulatory Visit: Payer: Self-pay | Admitting: Obstetrics & Gynecology

## 2011-01-18 DIAGNOSIS — Z1231 Encounter for screening mammogram for malignant neoplasm of breast: Secondary | ICD-10-CM

## 2011-02-19 ENCOUNTER — Ambulatory Visit
Admission: RE | Admit: 2011-02-19 | Discharge: 2011-02-19 | Disposition: A | Discharge status: Home / Self Care | Payer: BC Managed Care – PPO | Source: Ambulatory Visit | Attending: Obstetrics & Gynecology | Admitting: Obstetrics & Gynecology

## 2011-02-19 DIAGNOSIS — Z1231 Encounter for screening mammogram for malignant neoplasm of breast: Secondary | ICD-10-CM

## 2012-01-24 ENCOUNTER — Other Ambulatory Visit: Payer: Self-pay | Admitting: Obstetrics & Gynecology

## 2012-01-24 DIAGNOSIS — Z1231 Encounter for screening mammogram for malignant neoplasm of breast: Secondary | ICD-10-CM

## 2012-02-28 ENCOUNTER — Ambulatory Visit
Admission: RE | Admit: 2012-02-28 | Discharge: 2012-02-28 | Disposition: A | Discharge status: Home / Self Care | Payer: BC Managed Care – PPO | Source: Ambulatory Visit | Attending: Obstetrics & Gynecology | Admitting: Obstetrics & Gynecology

## 2012-02-28 DIAGNOSIS — Z1231 Encounter for screening mammogram for malignant neoplasm of breast: Secondary | ICD-10-CM

## 2012-09-23 ENCOUNTER — Other Ambulatory Visit (INDEPENDENT_AMBULATORY_CARE_PROVIDER_SITE_OTHER): Payer: BC Managed Care – PPO

## 2012-09-23 ENCOUNTER — Other Ambulatory Visit: Payer: Self-pay | Admitting: Obstetrics & Gynecology

## 2012-09-23 DIAGNOSIS — Z0000 Encounter for general adult medical examination without abnormal findings: Secondary | ICD-10-CM

## 2012-09-23 LAB — CBC
HCT: 42.4 % (ref 36.0–46.0)
Hemoglobin: 14.3 g/dL (ref 12.0–15.0)
RBC: 4.79 MIL/uL (ref 3.87–5.11)
WBC: 9.1 10*3/uL (ref 4.0–10.5)

## 2012-09-24 LAB — COMPREHENSIVE METABOLIC PANEL
AST: 24 U/L (ref 0–37)
Albumin: 4 g/dL (ref 3.5–5.2)
BUN: 14 mg/dL (ref 6–23)
CO2: 22 mEq/L (ref 19–32)
Calcium: 9.7 mg/dL (ref 8.4–10.5)
Chloride: 102 mEq/L (ref 96–112)
Glucose, Bld: 96 mg/dL (ref 70–99)
Potassium: 4.2 mEq/L (ref 3.5–5.3)

## 2012-09-24 LAB — LIPID PANEL
Cholesterol: 130 mg/dL (ref 0–200)
HDL: 47 mg/dL (ref >39)
Triglycerides: 91 mg/dL (ref <150)

## 2012-09-24 LAB — TSH: TSH: 4.538 u[IU]/mL — ABNORMAL HIGH (ref 0.350–4.500)

## 2012-09-25 ENCOUNTER — Ambulatory Visit (INDEPENDENT_AMBULATORY_CARE_PROVIDER_SITE_OTHER): Payer: BC Managed Care – PPO | Admitting: Obstetrics & Gynecology

## 2012-09-25 ENCOUNTER — Encounter: Payer: Self-pay | Admitting: Obstetrics & Gynecology

## 2012-09-25 VITALS — BP 150/82 | HR 60 | Resp 16 | Ht 65.0 in | Wt 263.4 lb

## 2012-09-25 DIAGNOSIS — E559 Vitamin D deficiency, unspecified: Secondary | ICD-10-CM

## 2012-09-25 DIAGNOSIS — Z01419 Encounter for gynecological examination (general) (routine) without abnormal findings: Secondary | ICD-10-CM

## 2012-09-25 DIAGNOSIS — Z0000 Encounter for general adult medical examination without abnormal findings: Secondary | ICD-10-CM

## 2012-09-25 DIAGNOSIS — E039 Hypothyroidism, unspecified: Secondary | ICD-10-CM

## 2012-09-25 MED ORDER — SERTRALINE HCL 50 MG PO TABS: 50.0000 mg | ORAL_TABLET | Freq: Every day | ORAL | Status: DC

## 2012-09-25 MED ORDER — VITAMIN D (ERGOCALCIFEROL) 1.25 MG (50000 UNIT) PO CAPS: 50000.0000 [IU] | ORAL_CAPSULE | ORAL | Status: DC

## 2012-09-25 NOTE — Patient Instructions (Signed)

## 2012-09-25 NOTE — Addendum Note (Signed)
Addended by: Clide Dales R on: 09/25/2012 02:42 PM   Modules accepted: Orders

## 2012-09-25 NOTE — Progress Notes (Signed)
52 y.o. G0P0000 MarriedCaucasianF here for annual exam.    Patient's last menstrual period was 10/12/2010.          Sexually active: yes  The current method of family planning is status post hysterectomy.    Exercising: no  not regularly Smoker:  no  Health Maintenance: Pap:  09/01/10 WNL History of abnormal Pap:  no MMG:  02/28/12 normal, 3D Colonoscopy:  6/11 repeat in 10 years BMD:   none TDaP:  2007  Screening Labs: here, Hb today: here this week, Urine today: negative   reports that she has never smoked. She has never used smokeless tobacco. She reports that she does not drink alcohol or use illicit drugs.  Past Medical History  Diagnosis Date  . Menorrhagia   . Pelvic pain in female   . PONV (postoperative nausea and vomiting)     ponv, slow to awake  . Environmental allergies   . Seasonal allergies   . Headache(784.0)     migraines-on meds for control, relpax, ketoprophen, vicoden, muscle relaxer  . Arthritis     bilateral knees, recent LCL sprain-on left knee  . Sprain 10/25/2010    left knee  . Lichen sclerosus     Past Surgical History  Procedure Laterality Date  . Ganglion cyst removal    . Functional endoscopic sinus surgery    . Appendectomy    . Diagnostic laparoscopy    . Dilation and curettage of uterus  2011    attempted ablation  . Lasik    . Salpingoophorectomy  11/06/2010    Procedure: SALPINGO OOPHERECTOMY;  Surgeon: Lum Keas;  Location: [facility];  Service: Gynecology;  Laterality: Right;  . Hysteroscopy      failed  . Robotic assisted total hysterectomy      TLH/RSO    Current Outpatient Prescriptions  Medication Sig Dispense Refill  . CALCIUM PO Take by mouth.      . cetirizine (ZYRTEC) 10 MG tablet Take 10 mg by mouth daily as needed. For allergies       . clobetasol (TEMOVATE) 0.05 % cream Apply 1 application topically 2 (two) times daily as needed. For lichen planus       . diphenhydrAMINE (BENADRYL) 25 MG tablet Take 25 mg by  mouth every 6 (six) hours as needed for itching.      . eletriptan (RELPAX) 40 MG tablet One tablet by mouth as needed for migraine headache.  If the headache improves and then returns, dose may be repeated after 2 hours have elapsed since first dose (do not exceed 80 mg per day). may repeat in 2 hours if necessary       . Hydrocodone-Acetaminophen (NORCO PO) Take by mouth as needed.      [name] ketoprofen (ORUDIS) 50 MG capsule Take 50 mg by mouth daily as needed. For migraines        . methocarbamol (ROBAXIN) 750 MG tablet Take 750 mg by mouth 3 (three) times daily. As needed      . naproxen sodium (ANAPROX) 220 MG tablet Take 220 mg by mouth daily.      . NON FORMULARY Allergy injections      . Omega-3 Fatty Acids (FISH OIL PO) Take by mouth.       No current facility-administered medications for this visit.    Family History  Problem Relation Age of Onset  . Breast cancer Maternal Aunt 70  . Breast cancer Paternal Aunt 88  . Heart disease Father   .  Skin cancer Mother     ROS:  Pertinent items are noted in HPI.  Otherwise, a comprehensive ROS was negative.  Exam:   BP 150/82  Pulse 60  Resp 16  Ht 5\' 5"  (1.651 m)  Wt 263 lb 6.4 oz (119.477 kg)  BMI 43.83 kg/m2  LMP 10/12/2010  Weight change +6 lb Height: 5\' 5"  (165.1 cm)  Ht Readings from Last 3 Encounters:  09/25/12 5\' 5"  (1.651 m)  11/02/10 5\' 5"  (1.651 m)    General appearance: alert, cooperative and appears stated age Head: Normocephalic, without obvious abnormality, atraumatic Neck: no adenopathy, supple, symmetrical, trachea midline and thyroid normal to inspection and palpation Lungs: clear to auscultation bilaterally Breasts: normal appearance, no masses or tenderness Heart: regular rate and rhythm Abdomen: soft, non-tender; bowel sounds normal; no masses,  no organomegaly Extremities: extremities normal, atraumatic, no cyanosis or edema Skin: Skin color, texture, turgor normal. No rashes or lesions Lymph nodes:  Cervical, supraclavicular, and axillary nodes normal. No abnormal inguinal nodes palpated Neurologic: Grossly normal   Pelvic: External genitalia:  no lesions              Urethra:  normal appearing urethra with no masses, tenderness or lesions              Bartholins and Skenes: normal                 Vagina: normal appearing vagina with normal color and discharge, no lesions              Cervix: absent              Pap taken: no Bimanual Exam:  Uterus:  uterus absent              Adnexa: no mass, fullness, tenderness               Rectovaginal: Confirms               Anus:  normal sphincter tone, no lesions  A:  Well Woman with normal exam S/p Robotic TLH/RSO LS&A Generalized heat intolerance.  P:   Mammogram yearly pap smear not indicated.  H/O TLH/RSO. Repeat TSH and Vit D level 8 weeks. Starting 50,000IU weekly until next follow-up Trial of Zoloft for increased "hot sensations".  Nausea discussed.  Serotonin syndrome discussed.  Pt will give me update in one month.  Rx to pharmacy. return annually or prn  An After Visit Summary was printed and given to the patient.

## 2012-11-20 ENCOUNTER — Other Ambulatory Visit: Payer: BC Managed Care – PPO

## 2012-11-20 DIAGNOSIS — E559 Vitamin D deficiency, unspecified: Secondary | ICD-10-CM

## 2012-11-20 DIAGNOSIS — E039 Hypothyroidism, unspecified: Secondary | ICD-10-CM

## 2012-11-21 LAB — THYROID PANEL WITH TSH
T3 Uptake: 41.8 % — ABNORMAL HIGH (ref 22.5–37.0)
T4, Total: 9.2 ug/dL (ref 5.0–12.5)

## 2012-11-21 LAB — VITAMIN D 25 HYDROXY (VIT D DEFICIENCY, FRACTURES): Vit D, 25-Hydroxy: 24 ng/mL — ABNORMAL LOW (ref 30–89)

## 2012-11-22 ENCOUNTER — Encounter: Payer: Self-pay | Admitting: Obstetrics & Gynecology

## 2012-11-22 ENCOUNTER — Other Ambulatory Visit: Payer: Self-pay | Admitting: Obstetrics & Gynecology

## 2012-11-22 DIAGNOSIS — E559 Vitamin D deficiency, unspecified: Secondary | ICD-10-CM

## 2012-11-22 MED ORDER — VITAMIN D (ERGOCALCIFEROL) 1.25 MG (50000 UNIT) PO CAPS: 50000.0000 [IU] | ORAL_CAPSULE | ORAL | Status: DC

## 2012-11-22 NOTE — Progress Notes (Signed)
rx to pharmacy for yr.

## 2012-12-18 ENCOUNTER — Other Ambulatory Visit: Payer: Self-pay

## 2013-01-05 ENCOUNTER — Telehealth: Payer: Self-pay | Admitting: *Deleted

## 2013-01-05 DIAGNOSIS — E559 Vitamin D deficiency, unspecified: Secondary | ICD-10-CM

## 2013-01-05 MED ORDER — VITAMIN D (ERGOCALCIFEROL) 1.25 MG (50000 UNIT) PO CAPS: 50000.0000 [IU] | ORAL_CAPSULE | ORAL | Status: DC

## 2013-01-05 NOTE — Telephone Encounter (Signed)
Fax From: [facility] for Vitamin D 50,000 (Pharmacy requesting 90 day supply)  Last Refilled: 11/22/12 #4/12 refills  Aex: 09/25/12  Vitamin D 50,000 #12/4 [facility]

## 2013-02-10 ENCOUNTER — Other Ambulatory Visit: Payer: Self-pay

## 2013-02-10 DIAGNOSIS — Z1231 Encounter for screening mammogram for malignant neoplasm of breast: Secondary | ICD-10-CM

## 2013-03-04 ENCOUNTER — Ambulatory Visit
Admission: RE | Admit: 2013-03-04 | Discharge: 2013-03-04 | Disposition: A | Discharge status: Home / Self Care | Payer: BC Managed Care – PPO | Source: Ambulatory Visit

## 2013-03-04 DIAGNOSIS — Z1231 Encounter for screening mammogram for malignant neoplasm of breast: Secondary | ICD-10-CM

## 2013-06-04 ENCOUNTER — Other Ambulatory Visit: Payer: Self-pay | Admitting: Dermatology

## 2013-07-17 ENCOUNTER — Ambulatory Visit (INDEPENDENT_AMBULATORY_CARE_PROVIDER_SITE_OTHER): Payer: BC Managed Care – PPO | Admitting: Obstetrics & Gynecology

## 2013-07-17 VITALS — BP 118/82 | HR 64 | Resp 16 | Ht 65.25 in | Wt 263.0 lb

## 2013-07-17 DIAGNOSIS — N951 Menopausal and female climacteric states: Secondary | ICD-10-CM

## 2013-07-17 MED ORDER — CLOBETASOL PROPIONATE 0.05 % EX CREA: 1.0000 "application " | TOPICAL_CREAM | Freq: Two times a day (BID) | CUTANEOUS | Status: DC | PRN

## 2013-07-17 MED ORDER — PAROXETINE HCL 10 MG PO TABS: 10.0000 mg | ORAL_TABLET | ORAL | Status: DC

## 2013-07-17 NOTE — Progress Notes (Signed)
Patient ID: Savannah [name], female   DOB: 1960/10/08, 53 y.o.   MRN: [id number]  53 y.o. Married Caucasian female G0P0000 here to discuss menopausal symptoms.  Having a lot of hot flashes.  Tried sertraline last year without success.  The higher the dose, the more depressed mood she had.  She felt like she would try being off of this.  In the winter, she states this was okay but now, with the hotter weather, she is having a lot more hot flashes and is much "sweatier".  She would really like to do something about this is possible.  Long hx of migraines.  Has been told by neurologist that he has concerns regarding HRT.  Will need to discuss with neurologist if we decide to proceed with that avenue.  I would clear this with him first.  Pt has been some ads for [place] and would like to know if this is an option.  D/w pt this is and SSRI just like Sertraline.  Rx is for 7.5mg  dosing where standard paxil dosing is 10mg .  Nausea the first seven days of use and seratonin syndrome discussed.  Pt will start slowly and work up.  She will call with any mood changes and knows to stop immediatly if that does occur.  Pt requests rx refill for Clobetasol as well today.  O: Healthy WD,WN female Affect:  normal  A:Vasomotor symptoms  P: Paxil 10mg  daily.  Pt can start with 1/2 tab if desires.  Know this dosage can be increased if helping. Clobetasol 0.05% ointment BID up to 7 days prn vulvar itching.  Rx to pharmacy.

## 2013-07-24 ENCOUNTER — Encounter: Payer: Self-pay | Admitting: Obstetrics & Gynecology

## 2013-09-17 ENCOUNTER — Other Ambulatory Visit: Payer: Self-pay | Admitting: Obstetrics & Gynecology

## 2013-09-17 MED ORDER — PAROXETINE HCL 10 MG PO TABS: 10.0000 mg | ORAL_TABLET | ORAL | Status: DC

## 2013-09-17 NOTE — Telephone Encounter (Signed)
Routed to [facility]

## 2013-09-17 NOTE — Telephone Encounter (Signed)
Last AEX: 09/25/12 Last refill:07/17/13 #30, 1 ref Current AEX:12/22/13  Please advise

## 2013-09-17 NOTE — Telephone Encounter (Signed)
[facility], [employer]  Patient request a refill of generic paxil. Patient is completely out of this prescription.

## 2013-12-22 ENCOUNTER — Ambulatory Visit (INDEPENDENT_AMBULATORY_CARE_PROVIDER_SITE_OTHER): Payer: BC Managed Care – PPO | Admitting: Obstetrics & Gynecology

## 2013-12-22 ENCOUNTER — Encounter: Payer: Self-pay | Admitting: Obstetrics & Gynecology

## 2013-12-22 VITALS — BP 128/64 | HR 84 | Resp 18 | Ht 65.0 in | Wt 260.0 lb

## 2013-12-22 DIAGNOSIS — E559 Vitamin D deficiency, unspecified: Secondary | ICD-10-CM

## 2013-12-22 DIAGNOSIS — L900 Lichen sclerosus et atrophicus: Secondary | ICD-10-CM

## 2013-12-22 DIAGNOSIS — Z0000 Encounter for general adult medical examination without abnormal findings: Secondary | ICD-10-CM

## 2013-12-22 DIAGNOSIS — Z01419 Encounter for gynecological examination (general) (routine) without abnormal findings: Secondary | ICD-10-CM

## 2013-12-22 LAB — POCT URINALYSIS DIPSTICK
PH UA: 5
Urobilinogen, UA: NEGATIVE

## 2013-12-22 LAB — HEMOGLOBIN, FINGERSTICK: Hemoglobin, fingerstick: 14.1 g/dL (ref 12.0–16.0)

## 2013-12-22 MED ORDER — PAROXETINE HCL 10 MG PO TABS: 10.0000 mg | ORAL_TABLET | ORAL | Status: DC

## 2013-12-22 MED ORDER — VITAMIN D (ERGOCALCIFEROL) 1.25 MG (50000 UNIT) PO CAPS: 50000.0000 [IU] | ORAL_CAPSULE | ORAL | Status: DC

## 2013-12-22 MED ORDER — CLOBETASOL PROPIONATE 0.05 % EX CREA: 1.0000 "application " | TOPICAL_CREAM | Freq: Two times a day (BID) | CUTANEOUS | Status: DC | PRN

## 2013-12-22 NOTE — Progress Notes (Signed)
53 y.o. G0P0000 MarriedCaucasianF here for annual exam.  No vaginal bleeding.  Doing well.  Headaches have been under good control.    Patient's last menstrual period was 10/14/2010.          Sexually active: Yes.    The current method of family planning is status post hysterectomy.    Exercising: No.  The patient does not participate in regular exercise at present. Smoker:  no  Health Maintenance: Pap:  09/01/10 NEG History of abnormal Pap:  no MMG:  03/05/13 Bi-rads 1, 3D Colonoscopy:  6/11- Normal  f/u in 10 years  BMD:   Never have done TDaP:  01/13/2006  Screening Labs: 2014, Hb today: 14.1, Urine today: [place] 1   reports that she has never smoked. She has never used smokeless tobacco. She reports that she does not drink alcohol or use illicit drugs.  Past Medical History  Diagnosis Date  . Menorrhagia   . Pelvic pain in female   . PONV (postoperative nausea and vomiting)     ponv, slow to awake  . Environmental allergies   . Seasonal allergies   . Headache(784.0)     migraines-on meds for control, relpax, ketoprophen, vicoden, muscle relaxer  . Arthritis     bilateral knees, recent LCL sprain-on left knee  . Sprain 10/25/2010    left knee  . Lichen sclerosus     Past Surgical History  Procedure Laterality Date  . Ganglion cyst removal    . Functional endoscopic sinus surgery    . Appendectomy    . Diagnostic laparoscopy    . Dilation and curettage of uterus  2011    attempted ablation  . Lasik    . Salpingoophorectomy  11/06/2010    Procedure: SALPINGO OOPHERECTOMY;  Surgeon: Felipa Emory;  Location: [facility];  Service: Gynecology;  Laterality: Right;  . Hysteroscopy      failed  . Robotic assisted total hysterectomy      TLH/RSO    Current Outpatient Prescriptions  Medication Sig Dispense Refill  . CALCIUM PO Take by mouth.    . cetirizine (ZYRTEC) 10 MG tablet Take 10 mg by mouth daily as needed. For allergies     . clobetasol cream (TEMOVATE) 4.17 %  Apply 1 application topically 2 (two) times daily as needed. For lichen planus 60 g 1  . diphenhydrAMINE (BENADRYL) 25 MG tablet Take 25 mg by mouth every 6 (six) hours as needed for itching.    . eletriptan (RELPAX) 40 MG tablet One tablet by mouth as needed for migraine headache.  If the headache improves and then returns, dose may be repeated after 2 hours have elapsed since first dose (do not exceed 80 mg per day). may repeat in 2 hours if necessary     . Hydrocodone-Acetaminophen (NORCO PO) Take by mouth as needed.    [name] ketoprofen (ORUDIS) 50 MG capsule Take 50 mg by mouth daily as needed. For migraines      . methocarbamol (ROBAXIN) 750 MG tablet Take 750 mg by mouth 3 (three) times daily. As needed    . NON FORMULARY Allergy injections    . Omega-3 Fatty Acids (FISH OIL PO) Take by mouth.    [name] PARoxetine (PAXIL) 10 MG tablet Take 1 tablet (10 mg total) by mouth every morning. 30 tablet 3  . Vitamin D, Ergocalciferol, (DRISDOL) 50000 UNITS CAPS capsule Take 1 capsule (50,000 Units total) by mouth every 7 (seven) days. 12 capsule 4   No  current facility-administered medications for this visit.    Family History  Problem Relation Age of Onset  . Breast cancer Maternal Aunt 70  . Breast cancer Paternal Aunt 23  . Heart disease Father   . Skin cancer Mother     ROS:  Pertinent items are noted in HPI.  Otherwise, a comprehensive ROS was negative.  Exam:   BP 128/64 mmHg  Pulse 84  Resp 18  Ht 5\' 5"  (1.651 m)  Wt 260 lb (117.935 kg)  BMI 43.27 kg/m2  LMP 10/14/2010  Weight change: -3#  Height: 5\' 5"  (165.1 cm)  Ht Readings from Last 3 Encounters:  12/22/13 5\' 5"  (1.651 m)  07/17/13 5' 5.25" (1.657 m)  09/25/12 5\' 5"  (1.651 m)    General appearance: alert, cooperative and appears stated age Head: Normocephalic, without obvious abnormality, atraumatic Neck: no adenopathy, supple, symmetrical, trachea midline and thyroid normal to inspection and palpation Lungs: clear to  auscultation bilaterally Breasts: normal appearance, no masses or tenderness Heart: regular rate and rhythm Abdomen: soft, non-tender; bowel sounds normal; no masses,  no organomegaly Extremities: extremities normal, atraumatic, no cyanosis or edema Skin: Skin color, texture, turgor normal. No rashes or lesions Lymph nodes: Cervical, supraclavicular, and axillary nodes normal. No abnormal inguinal nodes palpated Neurologic: Grossly normal   Pelvic: External genitalia:  no lesions              Urethra:  normal appearing urethra with no masses, tenderness or lesions              Bartholins and Skenes: normal                 Vagina: normal appearing vagina with normal color and discharge, no lesions              Cervix: absent              Pap taken: No. Bimanual Exam:  Uterus:  uterus absent              Adnexa: no mass, fullness, tenderness               Rectovaginal: Confirms               Anus:  normal sphincter tone, no lesions      A:  Well Woman with normal exam H/O Robotic TLH/RSO LS&A, much worse today on exam  P: Mammogram yearly pap smear not indicated. H/O TLH/RSO. Vit D level today and cont 50,000IU weekly.  Rx to pharmacy. Paxil 10mg  daily.  #90/4RF Restart clobetasol 0.05% ointment BID x 4 weeks then nightly until follow up.  Recheck 8 weeks   return annually or prn  An After Visit Summary was printed and given to the patient.

## 2013-12-23 DIAGNOSIS — L900 Lichen sclerosus et atrophicus: Secondary | ICD-10-CM | POA: Insufficient documentation

## 2013-12-23 LAB — VITAMIN D 25 HYDROXY (VIT D DEFICIENCY, FRACTURES): Vit D, 25-Hydroxy: 35 ng/mL (ref 30–89)

## 2014-01-29 ENCOUNTER — Other Ambulatory Visit: Payer: Self-pay

## 2014-01-29 DIAGNOSIS — Z1231 Encounter for screening mammogram for malignant neoplasm of breast: Secondary | ICD-10-CM

## 2014-02-12 DIAGNOSIS — H9190 Unspecified hearing loss, unspecified ear: Secondary | ICD-10-CM

## 2014-02-12 HISTORY — DX: Unspecified hearing loss, unspecified ear: H91.90

## 2014-02-17 ENCOUNTER — Encounter: Payer: Self-pay | Admitting: Obstetrics & Gynecology

## 2014-02-18 ENCOUNTER — Encounter: Payer: Self-pay | Admitting: Obstetrics & Gynecology

## 2014-02-18 ENCOUNTER — Ambulatory Visit (INDEPENDENT_AMBULATORY_CARE_PROVIDER_SITE_OTHER): Payer: BC Managed Care – PPO | Admitting: Obstetrics & Gynecology

## 2014-02-18 VITALS — BP 126/78 | HR 64 | Resp 12 | Wt 257.4 lb

## 2014-02-18 DIAGNOSIS — L900 Lichen sclerosus et atrophicus: Secondary | ICD-10-CM

## 2014-02-18 NOTE — Progress Notes (Signed)
Subjective:     Patient ID: Savannah [name], female   DOB: 1960-09-14, 55 y.o.   MRN: [id number]  HPI 54 yo G0 with LS&A here for follow up.  Pt reports itching is gone and went away so quickly with the steroid.  Using just nightly.  Has been using about 7 weeks.  No vaginal bleeding.  Review of Systems  All other systems reviewed and are negative.      Objective:   Physical Exam  Constitutional: She appears well-developed and well-nourished.  Genitourinary:     Skin: Skin is warm and dry.  Psychiatric: She has a normal mood and affect.       Assessment:     LS&A     Plan:     Continue to Clobetasol 0.05% ointment nightly for another 2-3 weeks, then decrease to 2-3 times weekly and continue for a full 12 weeks.  Pt will then use it as needed, up to BID x 7 days, with "flares".

## 2014-03-08 ENCOUNTER — Ambulatory Visit
Admission: RE | Admit: 2014-03-08 | Discharge: 2014-03-08 | Disposition: A | Discharge status: Home / Self Care | Payer: BC Managed Care – PPO | Source: Ambulatory Visit

## 2014-03-08 DIAGNOSIS — Z1231 Encounter for screening mammogram for malignant neoplasm of breast: Secondary | ICD-10-CM

## 2014-06-25 ENCOUNTER — Ambulatory Visit: Payer: Self-pay | Admitting: Surgery

## 2014-06-25 NOTE — H&P (Signed)
Savannah [name] 06/25/2014 9:26 AM Location: [facility] Patient #: [id number] DOB: [birth year] Married / Language: English / Race: White Female History of Present Illness Savannah Moores A. Denean Pavon Savannah [name]; 06/25/2014 9:51 AM) Patient words: lipoma on leg   Pt sent at the request of Dr Savannah [name] for mass on medial right thigh. It has been there many years. It is getting larger and putting pressure on her right knee. It causes mild discomfort. It burns . No redness or drainage.  The patient is a 54 year old female   Other Problems Savannah [name], Savannah [name]; 06/25/2014 9:26 AM) Back Pain General anesthesia - complications Migraine Headache Oophorectomy  Past Surgical History Savannah [name], Savannah [name]; 06/25/2014 9:26 AM) Appendectomy Hysterectomy (not due to cancer) - Partial Oral Surgery  Diagnostic Studies History Savannah [name], Savannah [name]; 06/25/2014 9:26 AM) Colonoscopy 1-5 years ago Mammogram within last year Pap Smear 1-5 years ago  Allergies Savannah [name], Savannah [name]; 06/25/2014 9:27 AM) No Known Drug Allergies 06/25/2014  Medication History Savannah [name], Savannah [name]; 06/25/2014 9:31 AM) Calcium (1500MG  Tablet, Oral) Active. Clobetasol Propionate (0.05% Cream, External) Active. Benadryl Allergy (25MG  Capsule, Oral) Active. Relpax (40MG  Tablet, Oral) Active. Flurbiprofen Active. Omega-3 Fish Oil (500MG  Capsule, Oral) Active. Vitamin D (50000UNIT Capsule, Oral) Active. Paxil (10MG  Tablet, Oral) Active. Medications Reconciled  Social History Savannah [name], Savannah [name]; 06/25/2014 9:26 AM) Alcohol use Remotely quit alcohol use. Caffeine use Carbonated beverages, Tea. No drug use Tobacco use Never smoker.  Family History Savannah [name], Savannah [name]; 06/25/2014 9:26 AM) Arthritis Mother. Heart Disease Father. Hypertension Father. Melanoma Mother.  Pregnancy / Birth History Savannah [name], Savannah [name]; 06/25/2014 9:26 AM) Age at menarche 82 years. Gravida 0     Review of Systems Savannah [name] Savannah [name]; 06/25/2014  9:26 AM) General Not Present- Appetite Loss, Chills, Fatigue, Fever, Night Sweats, Weight Gain and Weight Loss. Skin Not Present- Change in Wart/Mole, Dryness, Hives, Jaundice, New Lesions, Non-Healing Wounds, Rash and Ulcer. HEENT Present- Hearing Loss, Nose Bleed, Seasonal Allergies and Wears glasses/contact lenses. Not Present- Earache, Hoarseness, Oral Ulcers, Ringing in the Ears, Sinus Pain, Sore Throat, Visual Disturbances and Yellow Eyes. Respiratory Not Present- Bloody sputum, Chronic Cough, Difficulty Breathing, Snoring and Wheezing. Cardiovascular Present- Leg Cramps. Not Present- Chest Pain, Difficulty Breathing Lying Down, Palpitations, Rapid Heart Rate, Shortness of Breath and Swelling of Extremities. Gastrointestinal Not Present- Abdominal Pain, Bloating, Bloody Stool, Change in Bowel Habits, Chronic diarrhea, Constipation, Difficulty Swallowing, Excessive gas, Gets full quickly at meals, Hemorrhoids, Indigestion, Nausea, Rectal Pain and Vomiting. Female Genitourinary Not Present- Frequency, Nocturia, Painful Urination, Pelvic Pain and Urgency. Musculoskeletal Present- Back Pain, Joint Pain and Joint Stiffness. Not Present- Muscle Pain, Muscle Weakness and Swelling of Extremities. Neurological Present- Headaches, Numbness and Trouble walking. Not Present- Decreased Memory, Fainting, Seizures, Tingling, Tremor and Weakness. Psychiatric Not Present- Anxiety, Bipolar, Change in Sleep Pattern, Depression, Fearful and Frequent crying. Endocrine Present- Hot flashes. Not Present- Cold Intolerance, Excessive Hunger, Hair Changes, Heat Intolerance and New Diabetes. Hematology Present- Easy Bruising. Not Present- Excessive bleeding, Gland problems, HIV and Persistent Infections.  Vitals Savannah [name] Savannah [name]; 06/25/2014 9:31 AM) 06/25/2014 9:31 AM Weight: 258 lb Height: 65in Body Surface Area: 2.32 m Body Mass Index: 42.93 kg/m Temp.: 98.76F(Oral)  Pulse: 111 (Regular)  Resp.: 18  (Unlabored)  BP: 132/78 (Sitting, Left Arm, Standard)     Physical Exam (Savannah Blatz A. Abad Manard Savannah [name]; 06/25/2014 9:52 AM)  General Mental Status-Alert. General Appearance-Consistent with stated age. Hydration-Well hydrated. Voice-Normal.  Integumentary Note: 10 cm x 10 cm fatty mass mobile medial right  thigh soft    Head and Neck Head-normocephalic, atraumatic with no lesions or palpable masses. Trachea-midline. Thyroid Gland Characteristics - normal size and consistency.  Eye Eyeball - Bilateral-Extraocular movements intact. Sclera/Conjunctiva - Bilateral-No scleral icterus.  Chest and Lung Exam Chest and lung exam reveals -quiet, even and easy respiratory effort with no use of accessory muscles and on auscultation, normal breath sounds, no adventitious sounds and normal vocal resonance. Inspection Chest Wall - Normal. Back - normal.  Cardiovascular Cardiovascular examination reveals -normal heart sounds, regular rate and rhythm with no murmurs and normal pedal pulses bilaterally.  Neurologic Neurologic evaluation reveals -alert and oriented x 3 with no impairment of recent or remote memory. Mental Status-Normal.  Musculoskeletal Normal Exam - Left-Upper Extremity Strength Normal and Lower Extremity Strength Normal. Normal Exam - Right-Upper Extremity Strength Normal and Lower Extremity Strength Normal.    Assessment & Plan (Savannah Parzych A. 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 PT DESIRES EXCISION. Risk of surgery bleeding, infection, nerve injury with numbness/loss of function to area, vein injury or the need to remove theses structures. Wound issues seroma, loss of function and expected recovery course and care discussed. She understands this can be watched as well. She wishes to proceed.  Current Plans Pt Education - INSTRUCTIONS: discussed with patient and provided  information. The anatomy and the physiology was discussed. The pathophysiology and natural history of the disease was discussed. Options were discussed and recommendations were made. Technique, risks, benefits, & alternatives were discussed. Risks such as stroke, heart attack, bleeding, indection, death, and other risks discussed. Questions answered. The patient agrees to proceed.

## 2014-07-20 ENCOUNTER — Encounter (HOSPITAL_BASED_OUTPATIENT_CLINIC_OR_DEPARTMENT_OTHER): Payer: Self-pay | Admitting: *Deleted

## 2014-07-22 ENCOUNTER — Ambulatory Visit (HOSPITAL_BASED_OUTPATIENT_CLINIC_OR_DEPARTMENT_OTHER)
Admission: RE | Admit: 2014-07-22 | Discharge: 2014-07-22 | Disposition: A | Discharge status: Home / Self Care | Payer: BC Managed Care – PPO | Source: Ambulatory Visit | Attending: Surgery | Admitting: Surgery

## 2014-07-22 ENCOUNTER — Encounter (HOSPITAL_BASED_OUTPATIENT_CLINIC_OR_DEPARTMENT_OTHER): Payer: Self-pay | Admitting: Anesthesiology

## 2014-07-22 ENCOUNTER — Ambulatory Visit (HOSPITAL_BASED_OUTPATIENT_CLINIC_OR_DEPARTMENT_OTHER): Payer: BC Managed Care – PPO | Admitting: Anesthesiology

## 2014-07-22 ENCOUNTER — Encounter (HOSPITAL_BASED_OUTPATIENT_CLINIC_OR_DEPARTMENT_OTHER)
Admission: RE | Disposition: A | Discharge status: Home / Self Care | Payer: Self-pay | Source: Ambulatory Visit | Attending: Surgery

## 2014-07-22 DIAGNOSIS — D1723 Benign lipomatous neoplasm of skin and subcutaneous tissue of right leg: Secondary | ICD-10-CM | POA: Diagnosis not present

## 2014-07-22 DIAGNOSIS — G43909 Migraine, unspecified, not intractable, without status migrainosus: Secondary | ICD-10-CM | POA: Diagnosis not present

## 2014-07-22 DIAGNOSIS — Z6841 Body Mass Index (BMI) 40.0 and over, adult: Secondary | ICD-10-CM | POA: Diagnosis not present

## 2014-07-22 DIAGNOSIS — M1990 Unspecified osteoarthritis, unspecified site: Secondary | ICD-10-CM | POA: Insufficient documentation

## 2014-07-22 DIAGNOSIS — Z79899 Other long term (current) drug therapy: Secondary | ICD-10-CM | POA: Insufficient documentation

## 2014-07-22 DIAGNOSIS — Z791 Long term (current) use of non-steroidal anti-inflammatories (NSAID): Secondary | ICD-10-CM | POA: Diagnosis not present

## 2014-07-22 HISTORY — PX: LIPOMA EXCISION: SHX5283

## 2014-07-22 LAB — POCT HEMOGLOBIN-HEMACUE: Hemoglobin: 14.5 g/dL (ref 12.0–15.0)

## 2014-07-22 SURGERY — EXCISION LIPOMA
Anesthesia: General | Site: Leg Upper | Laterality: Right

## 2014-07-22 MED ORDER — OXYCODONE-ACETAMINOPHEN 5-325 MG PO TABS: 1.0000 | ORAL_TABLET | ORAL | Status: DC | PRN

## 2014-07-22 MED ORDER — OXYCODONE HCL 5 MG/5ML PO SOLN: 5.0000 mg | Freq: Once | ORAL | Status: DC | PRN

## 2014-07-22 MED ORDER — GLYCOPYRROLATE 0.2 MG/ML IJ SOLN: 0.2000 mg | Freq: Once | INTRAMUSCULAR | Status: DC | PRN

## 2014-07-22 MED ORDER — BUPIVACAINE-EPINEPHRINE 0.25% -1:200000 IJ SOLN
INTRAMUSCULAR | Status: DC | PRN
  Administered 2014-07-22: 16 mL

## 2014-07-22 MED ORDER — PROPOFOL 10 MG/ML IV BOLUS
INTRAVENOUS | Status: DC | PRN
Start: 2014-07-22 — End: 2014-07-23
  Administered 2014-07-22: 200 mg via INTRAVENOUS

## 2014-07-22 MED ORDER — LACTATED RINGERS IV SOLN
INTRAVENOUS | Status: DC
  Administered 2014-07-22: 15:00:00 via INTRAVENOUS

## 2014-07-22 MED ORDER — FENTANYL CITRATE (PF) 100 MCG/2ML IJ SOLN: 25.0000 ug | INTRAMUSCULAR | Status: DC | PRN

## 2014-07-22 MED ORDER — FENTANYL CITRATE (PF) 100 MCG/2ML IJ SOLN
INTRAMUSCULAR | Status: DC | PRN
  Administered 2014-07-22 (×2): 50 ug via INTRAVENOUS

## 2014-07-22 MED ORDER — 0.9 % SODIUM CHLORIDE (POUR BTL) OPTIME
TOPICAL | Status: DC | PRN
  Administered 2014-07-22: 120 mL

## 2014-07-22 MED ORDER — DEXAMETHASONE SODIUM PHOSPHATE 10 MG/ML IJ SOLN
INTRAMUSCULAR | Status: DC | PRN
Start: 2014-07-22 — End: 2014-07-23
  Administered 2014-07-22: 10 mg via INTRAVENOUS

## 2014-07-22 MED ORDER — SCOPOLAMINE 1 MG/3DAYS TD PT72
MEDICATED_PATCH | TRANSDERMAL | Status: DC | PRN
  Administered 2014-07-22: 1 via TRANSDERMAL

## 2014-07-22 MED ORDER — OXYCODONE HCL 5 MG PO TABS: 5.0000 mg | ORAL_TABLET | Freq: Once | ORAL | Status: DC | PRN

## 2014-07-22 MED ORDER — MIDAZOLAM HCL 5 MG/5ML IJ SOLN
INTRAMUSCULAR | Status: DC | PRN
  Administered 2014-07-22: 2 mg via INTRAVENOUS

## 2014-07-22 MED ORDER — ONDANSETRON HCL 4 MG/2ML IJ SOLN: 4.0000 mg | Freq: Four times a day (QID) | INTRAMUSCULAR | Status: DC | PRN

## 2014-07-22 MED ORDER — BUPIVACAINE-EPINEPHRINE (PF) 0.25% -1:200000 IJ SOLN
INTRAMUSCULAR | Status: AC
  Filled 2014-07-22: qty 30

## 2014-07-22 MED ORDER — MIDAZOLAM HCL 2 MG/2ML IJ SOLN: 1.0000 mg | INTRAMUSCULAR | Status: DC | PRN

## 2014-07-22 MED ORDER — PROPOFOL INFUSION 10 MG/ML OPTIME
INTRAVENOUS | Status: DC | PRN
  Administered 2014-07-22: 100 ug/kg/min via INTRAVENOUS

## 2014-07-22 MED ORDER — SCOPOLAMINE 1 MG/3DAYS TD PT72
MEDICATED_PATCH | TRANSDERMAL | Status: AC
  Filled 2014-07-22: qty 1

## 2014-07-22 MED ORDER — LIDOCAINE HCL (CARDIAC) 20 MG/ML IV SOLN
INTRAVENOUS | Status: DC | PRN
  Administered 2014-07-22: 50 mg via INTRAVENOUS

## 2014-07-22 MED ORDER — FENTANYL CITRATE (PF) 100 MCG/2ML IJ SOLN: 50.0000 ug | INTRAMUSCULAR | Status: DC | PRN

## 2014-07-22 MED ORDER — CEFAZOLIN SODIUM 10 G IJ SOLR
3.0000 g | INTRAMUSCULAR | Status: DC | PRN
  Administered 2014-07-22: 3 g via INTRAVENOUS

## 2014-07-22 MED ORDER — MIDAZOLAM HCL 2 MG/2ML IJ SOLN
INTRAMUSCULAR | Status: AC
  Filled 2014-07-22: qty 2

## 2014-07-22 MED ORDER — FENTANYL CITRATE (PF) 100 MCG/2ML IJ SOLN
INTRAMUSCULAR | Status: AC
  Filled 2014-07-22: qty 6

## 2014-07-22 SURGICAL SUPPLY — 46 items
APL SKNCLS STERI-STRIP NONHPOA (GAUZE/BANDAGES/DRESSINGS)
BANDAGE ELASTIC 4 VELCRO ST LF (GAUZE/BANDAGES/DRESSINGS) ×2 IMPLANT
BENZOIN TINCTURE PRP APPL 2/3 (GAUZE/BANDAGES/DRESSINGS) IMPLANT
BLADE SURG 10 STRL SS (BLADE) IMPLANT
BLADE SURG 15 STRL LF DISP TIS (BLADE) ×1 IMPLANT
BLADE SURG 15 STRL SS (BLADE) ×3
BNDG GAUZE ELAST 4 BULKY (GAUZE/BANDAGES/DRESSINGS) ×2 IMPLANT
CANISTER SUCT 1200ML W/VALVE (MISCELLANEOUS) IMPLANT
CHLORAPREP W/TINT 26ML (MISCELLANEOUS) ×3 IMPLANT
CLOSURE WOUND 1/2 X4 (GAUZE/BANDAGES/DRESSINGS)
COVER BACK TABLE 60X90IN (DRAPES) ×3 IMPLANT
COVER MAYO STAND STRL (DRAPES) ×3 IMPLANT
DECANTER SPIKE VIAL GLASS SM (MISCELLANEOUS) IMPLANT
DRAPE LAPAROTOMY 100X72 PEDS (DRAPES) ×3 IMPLANT
DRAPE UTILITY XL STRL (DRAPES) ×3 IMPLANT
ELECT COATED BLADE 2.86 ST (ELECTRODE) ×3 IMPLANT
ELECT REM PT RETURN 9FT ADLT (ELECTROSURGICAL) ×3
ELECTRODE REM PT RTRN 9FT ADLT (ELECTROSURGICAL) ×1 IMPLANT
GAUZE SPONGE 4X4 12PLY STRL (GAUZE/BANDAGES/DRESSINGS) ×2 IMPLANT
GLOVE BIO SURGEON STRL SZ7 (GLOVE) ×2 IMPLANT
GLOVE BIOGEL PI IND STRL 7.5 (GLOVE) IMPLANT
GLOVE BIOGEL PI IND STRL 8 (GLOVE) ×1 IMPLANT
GLOVE BIOGEL PI INDICATOR 7.5 (GLOVE) ×2
GLOVE BIOGEL PI INDICATOR 8 (GLOVE) ×2
GLOVE ECLIPSE 8.0 STRL XLNG CF (GLOVE) ×3 IMPLANT
GOWN STRL REUS W/ TWL LRG LVL3 (GOWN DISPOSABLE) ×2 IMPLANT
GOWN STRL REUS W/TWL LRG LVL3 (GOWN DISPOSABLE) ×6
LIQUID BAND (GAUZE/BANDAGES/DRESSINGS) ×2 IMPLANT
NDL HYPO 25X1 1.5 SAFETY (NEEDLE) ×1 IMPLANT
NEEDLE HYPO 25X1 1.5 SAFETY (NEEDLE) ×3 IMPLANT
NS IRRIG 1000ML POUR BTL (IV SOLUTION) ×2 IMPLANT
PACK BASIN DAY SURGERY FS (CUSTOM PROCEDURE TRAY) ×3 IMPLANT
PENCIL BUTTON HOLSTER BLD 10FT (ELECTRODE) ×3 IMPLANT
SLEEVE SCD COMPRESS KNEE MED (MISCELLANEOUS) ×3 IMPLANT
SPONGE LAP 4X18 X RAY DECT (DISPOSABLE) ×2 IMPLANT
STAPLER VISISTAT 35W (STAPLE) IMPLANT
STRIP CLOSURE SKIN 1/2X4 (GAUZE/BANDAGES/DRESSINGS) IMPLANT
SUT MON AB 4-0 PC3 18 (SUTURE) ×3 IMPLANT
SUT VICRYL 3-0 CR8 SH (SUTURE) ×2 IMPLANT
SUT VICRYL AB 3 0 TIES (SUTURE) IMPLANT
SYR CONTROL 10ML LL (SYRINGE) ×3 IMPLANT
TOWEL OR 17X24 6PK STRL BLUE (TOWEL DISPOSABLE) ×6 IMPLANT
TOWEL OR NON WOVEN STRL DISP B (DISPOSABLE) ×1 IMPLANT
TUBE CONNECTING 20'X1/4 (TUBING)
TUBE CONNECTING 20X1/4 (TUBING) IMPLANT
YANKAUER SUCT BULB TIP NO VENT (SUCTIONS) IMPLANT

## 2014-07-22 NOTE — H&P (View-Only) (Signed)
Savannah [name] 06/25/2014 9:26 AM Location: [facility] Patient #: [id number] DOB: [birth year] Married / Language: English / Race: White Female History of Present Illness [name] A. Aren Cherne MD; 06/25/2014 9:51 AM) Patient words: lipoma on leg   Pt sent at the request of Dr Delman Cheadle for mass on medial right thigh. It has been there many years. It is getting larger and putting pressure on her right knee. It causes mild discomfort. It burns . No redness or drainage.  The patient is a 54 year old female   Other Problems Elbert Ewings, [place]; 06/25/2014 9:26 AM) Back Pain General anesthesia - complications Migraine Headache Oophorectomy  Past Surgical History Elbert Ewings, CMA; 06/25/2014 9:26 AM) Appendectomy Hysterectomy (not due to cancer) - Partial Oral Surgery  Diagnostic Studies History Elbert Ewings, CMA; 06/25/2014 9:26 AM) Colonoscopy 1-5 years ago Mammogram within last year Pap Smear 1-5 years ago  Allergies Elbert Ewings, CMA; 06/25/2014 9:27 AM) No Known Drug Allergies 06/25/2014  Medication History Elbert Ewings, CMA; 06/25/2014 9:31 AM) Calcium (1500MG  Tablet, Oral) Active. Clobetasol Propionate (0.05% Cream, External) Active. Benadryl Allergy (25MG  Capsule, Oral) Active. Relpax (40MG  Tablet, Oral) Active. Flurbiprofen Active. Omega-3 Fish Oil (500MG  Capsule, Oral) Active. Vitamin D (50000UNIT Capsule, Oral) Active. Paxil (10MG  Tablet, Oral) Active. Medications Reconciled  Social History Elbert Ewings, [place]; 06/25/2014 9:26 AM) Alcohol use Remotely quit alcohol use. Caffeine use Carbonated beverages, Tea. No drug use Tobacco use Never smoker.  Family History Elbert Ewings, [place]; 06/25/2014 9:26 AM) Arthritis Mother. Heart Disease Father. Hypertension Father. Melanoma Mother.  Pregnancy / Birth History Elbert Ewings, CMA; 06/25/2014 9:26 AM) Age at menarche 60 years. Gravida 0     Review of Systems Elbert Ewings CMA; 06/25/2014  9:26 AM) General Not Present- Appetite Loss, Chills, Fatigue, Fever, Night Sweats, Weight Gain and Weight Loss. Skin Not Present- Change in Wart/Mole, Dryness, Hives, Jaundice, New Lesions, Non-Healing Wounds, Rash and Ulcer. HEENT Present- Hearing Loss, Nose Bleed, Seasonal Allergies and Wears glasses/contact lenses. Not Present- Earache, Hoarseness, Oral Ulcers, Ringing in the Ears, Sinus Pain, Sore Throat, Visual Disturbances and Yellow Eyes. Respiratory Not Present- Bloody sputum, Chronic Cough, Difficulty Breathing, Snoring and Wheezing. Cardiovascular Present- Leg Cramps. Not Present- Chest Pain, Difficulty Breathing Lying Down, Palpitations, Rapid Heart Rate, Shortness of Breath and Swelling of Extremities. Gastrointestinal Not Present- Abdominal Pain, Bloating, Bloody Stool, Change in Bowel Habits, Chronic diarrhea, Constipation, Difficulty Swallowing, Excessive gas, Gets full quickly at meals, Hemorrhoids, Indigestion, Nausea, Rectal Pain and Vomiting. Female Genitourinary Not Present- Frequency, Nocturia, Painful Urination, Pelvic Pain and Urgency. Musculoskeletal Present- Back Pain, Joint Pain and Joint Stiffness. Not Present- Muscle Pain, Muscle Weakness and Swelling of Extremities. Neurological Present- Headaches, Numbness and Trouble walking. Not Present- Decreased Memory, Fainting, Seizures, Tingling, Tremor and Weakness. Psychiatric Not Present- Anxiety, Bipolar, Change in Sleep Pattern, Depression, Fearful and Frequent crying. Endocrine Present- Hot flashes. Not Present- Cold Intolerance, Excessive Hunger, Hair Changes, Heat Intolerance and New Diabetes. Hematology Present- Easy Bruising. Not Present- Excessive bleeding, Gland problems, HIV and Persistent Infections.  Vitals Elbert Ewings CMA; 06/25/2014 9:31 AM) 06/25/2014 9:31 AM Weight: 258 lb Height: 65in Body Surface Area: 2.32 m Body Mass Index: 42.93 kg/m Temp.: 98.77F(Oral)  Pulse: 111 (Regular)  Resp.: 18  (Unlabored)  BP: 132/78 (Sitting, Left Arm, Standard)     Physical Exam (Kobee Medlen A. Kacee Sukhu MD; 06/25/2014 9:52 AM)  General Mental Status-Alert. General Appearance-Consistent with stated age. Hydration-Well hydrated. Voice-Normal.  Integumentary Note: 10 cm x 10 cm fatty mass mobile medial right  thigh soft    Head and Neck Head-normocephalic, atraumatic with no lesions or palpable masses. Trachea-midline. Thyroid Gland Characteristics - normal size and consistency.  Eye Eyeball - Bilateral-Extraocular movements intact. Sclera/Conjunctiva - Bilateral-No scleral icterus.  Chest and Lung Exam Chest and lung exam reveals -quiet, even and easy respiratory effort with no use of accessory muscles and on auscultation, normal breath sounds, no adventitious sounds and normal vocal resonance. Inspection Chest Wall - Normal. Back - normal.  Cardiovascular Cardiovascular examination reveals -normal heart sounds, regular rate and rhythm with no murmurs and normal pedal pulses bilaterally.  Neurologic Neurologic evaluation reveals -alert and oriented x 3 with no impairment of recent or remote memory. Mental Status-Normal.  Musculoskeletal Normal Exam - Left-Upper Extremity Strength Normal and Lower Extremity Strength Normal. Normal Exam - Right-Upper Extremity Strength Normal and Lower Extremity Strength Normal.    Assessment & Plan (Marcella Charlson A. 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 PT DESIRES EXCISION. Risk of surgery bleeding, infection, nerve injury with numbness/loss of function to area, vein injury or the need to remove theses structures. Wound issues seroma, loss of function and expected recovery course and care discussed. She understands this can be watched as well. She wishes to proceed.  Current Plans Pt Education - INSTRUCTIONS: discussed with patient and provided  information. The anatomy and the physiology was discussed. The pathophysiology and natural history of the disease was discussed. Options were discussed and recommendations were made. Technique, risks, benefits, & alternatives were discussed. Risks such as stroke, heart attack, bleeding, indection, death, and other risks discussed. Questions answered. The patient agrees to proceed.

## 2014-07-22 NOTE — Interval H&P Note (Signed)
History and Physical Interval Note:  07/22/2014 2:48 PM  Savannah [name]  has presented today for surgery, with the diagnosis of Right Thigh Lipoma  The various methods of treatment have been discussed with the patient and family. After consideration of risks, benefits and other options for treatment, the patient has consented to  Procedure(s): EXCISION RIGHT THIGH LIPOMA (Right) as a surgical intervention .  The patient's history has been reviewed, patient examined, no change in status, stable for surgery.  I have reviewed the patient's chart and labs.  Questions were answered to the patient's satisfaction.     Lama Narayanan A.

## 2014-07-22 NOTE — Anesthesia Preprocedure Evaluation (Signed)
Anesthesia Evaluation  Patient identified by MRN, date of birth, ID band Patient awake    Reviewed: Allergy & Precautions, NPO status , Patient's Chart, lab work & pertinent test results  History of Anesthesia Complications (+) PONV  Airway Mallampati: II   Neck ROM: full    Dental   Pulmonary neg pulmonary ROS,  breath sounds clear to auscultation        Cardiovascular negative cardio ROS  Rhythm:regular Rate:Normal     Neuro/Psych  Headaches,    GI/Hepatic   Endo/Other  Morbid obesity  Renal/GU      Musculoskeletal  (+) Arthritis -,   Abdominal   Peds  Hematology   Anesthesia Other Findings   Reproductive/Obstetrics                             Anesthesia Physical Anesthesia Plan  ASA: II  Anesthesia Plan: General   Post-op Pain Management:    Induction: Intravenous  Airway Management Planned: LMA  Additional Equipment:   Intra-op Plan:   Post-operative Plan:   Informed Consent: I have reviewed the patients History and Physical, chart, labs and discussed the procedure including the risks, benefits and alternatives for the proposed anesthesia with the patient or authorized representative who has indicated his/her understanding and acceptance.     Plan Discussed with: CRNA, Anesthesiologist and Surgeon  Anesthesia Plan Comments:         Anesthesia Quick Evaluation

## 2014-07-22 NOTE — Anesthesia Procedure Notes (Signed)
Procedure Name: LMA Insertion Date/Time: 07/22/2014 3:26 PM Performed by: Toula Moos L Pre-anesthesia Checklist: Patient identified, Emergency Drugs available, Suction available, Patient being monitored and Timeout performed Patient Re-evaluated:Patient Re-evaluated prior to inductionOxygen Delivery Method: Circle System Utilized Preoxygenation: Pre-oxygenation with 100% oxygen Intubation Type: IV induction Ventilation: Mask ventilation without difficulty LMA: LMA inserted LMA Size: 4.0 Number of attempts: 1 Airway Equipment and Method: Bite block Placement Confirmation: positive ETCO2 Tube secured with: Tape Dental Injury: Teeth and Oropharynx as per pre-operative assessment

## 2014-07-22 NOTE — Op Note (Signed)
Preoperative diagnosis: Right thigh lipoma subcutaneous 10 cm x 10 cm  Postoperative diagnosis: Same  Procedure: Excision of 10 cm subcutaneous right medial thigh lipoma  Surgeon: Erroll Luna MD   Anesthesia: LMA with 0.25% Sensorcaine with epinephrine  EBL: Minimal  Specimen: Right medial thigh lipoma  Indications for procedure:      the patient is a 54 year old female with a lipoma involving her medial right thigh just the level the. He desires excision due to increasing size discomfort.The procedure has been discussed with the patient.  Alternative therapies have been discussed with the patient.  Operative risks include bleeding,  Infection,  Organ injury,  Nerve injury,  Blood vessel injury,  DVT,  Pulmonary embolism,  Death,  And possible reoperation.  Medical management risks include worsening of present situation.  The success of the procedure is 50 -90 % at treating patients symptoms.  The patient understands and agrees to proceed.  Description of procedure: Patient is seen in the holding area. Right thigh is Elta [place] with the help of the patient. Patient taken back to the operating room placed upon the OR table. After sterile prep and drape and initiation of general anesthesia, a timeout was done. Incision made of the right medial thigh with a mass was located. The mass was fatty and loculated. It was removed in its entirety. Passed off the field. This is all the subcutaneous fat. Irrigation was used and wound closed with 3-0 Vicryl and 4-0 Monocryl. Liquid was applied. Dry dressing applied. Leg wrapped with Ace wrap. All final counts sponge, needle instruments found to be correct. Patient awoke, extubated taken recovery in satisfactory condition.

## 2014-07-22 NOTE — Discharge Instructions (Signed)
GENERAL SURGERY: POST OP INSTRUCTIONS ° °1. DIET: Follow a light bland diet the first 24 hours after arrival home, such as soup, liquids, crackers, etc.  Be sure to include lots of fluids daily.  Avoid fast food or heavy meals as your are more likely to get nauseated.   °2. Take your usually prescribed home medications unless otherwise directed. °3. PAIN CONTROL: °a. Pain is best controlled by a usual combination of three different methods TOGETHER: °i. Ice/Heat °ii. Over the counter pain medication °iii. Prescription pain medication °b. Most patients will experience some swelling and bruising around the incisions.  Ice packs or heating pads (30-60 minutes up to 6 times a day) will help. Use ice for the first few days to help decrease swelling and bruising, then switch to heat to help relax tight/sore spots and speed recovery.  Some people prefer to use ice alone, heat alone, alternating between ice & heat.  Experiment to what works for you.  Swelling and bruising can take several weeks to resolve.   °c. It is helpful to take an over-the-counter pain medication regularly for the first few weeks.  Choose one of the following that works best for you: °i. Naproxen (Aleve, etc)  Two 220mg tabs twice a day °ii. Ibuprofen (Advil, etc) Three 200mg tabs four times a day (every meal & bedtime) °iii. Acetaminophen (Tylenol, etc) 500-650mg four times a day (every meal & bedtime) °d. A  prescription for pain medication (such as oxycodone, hydrocodone, etc) should be given to you upon discharge.  Take your pain medication as prescribed.  °i. If you are having problems/concerns with the prescription medicine (does not control pain, nausea, vomiting, rash, itching, etc), please call us [phone] to see if we need to switch you to a different pain medicine that will work better for you and/or control your side effect better. °ii. If you need a refill on your pain medication, please contact your pharmacy.  They will contact our  office to request authorization. Prescriptions will not be filled after 5 pm or on week-ends. °4. Avoid getting constipated.  Between the surgery and the pain medications, it is common to experience some constipation.  Increasing fluid intake and taking a fiber supplement (such as Metamucil, Citrucel, FiberCon, MiraLax, etc) 1-2 times a day regularly will usually help prevent this problem from occurring.  A mild laxative (prune juice, Milk of Magnesia, MiraLax, etc) should be taken according to package directions if there are no bowel movements after 48 hours.   °5. Wash / shower every day.  You may shower over the dressings as they are waterproof.  Continue to shower over incision(s) after the dressing is off. °6. Remove your waterproof bandages 5 days after surgery.  You may leave the incision open to air.  You may have skin tapes (Steri Strips) covering the incision(s).  Leave them on until one week, then remove.  You may replace a dressing/Band-Aid to cover the incision for comfort if you wish.  ° ° ° ° °7. ACTIVITIES as tolerated:   °a. You may resume regular (light) daily activities beginning the next day--such as daily self-care, walking, climbing stairs--gradually increasing activities as tolerated.  If you can walk 30 minutes without difficulty, it is safe to try more intense activity such as jogging, treadmill, bicycling, low-impact aerobics, swimming, etc. °b. Save the most intensive and strenuous activity for last such as sit-ups, heavy lifting, contact sports, etc  Refrain from any heavy lifting or straining until you   are off narcotics for pain control.   °c. DO NOT PUSH THROUGH PAIN.  Let pain be your guide: If it hurts to do something, don't do it.  Pain is your body warning you to avoid that activity for another week until the pain goes down. °d. You may drive when you are no longer taking prescription pain medication, you can comfortably wear a seatbelt, and you can safely maneuver your car and  apply brakes. °e. You may have sexual intercourse when it is comfortable.  °8. FOLLOW UP in our office °a. Please call [facility] at [phone] to set up an appointment to see your surgeon in the office for a follow-up appointment approximately 2-3 weeks after your surgery. °b. Make sure that you call for this appointment the day you arrive home to insure a convenient appointment time. °9. IF YOU HAVE DISABILITY OR FAMILY LEAVE FORMS, BRING THEM TO THE OFFICE FOR PROCESSING.  DO NOT GIVE THEM TO YOUR DOCTOR. ° ° °WHEN TO CALL US [phone]: °1. Poor pain control °2. Reactions / problems with new medications (rash/itching, nausea, etc)  °3. Fever over 101.5 F (38.5 C) °4. Worsening swelling or bruising °5. Continued bleeding from incision. °6. Increased pain, redness, or drainage from the incision °7. Difficulty breathing / swallowing ° ° The clinic staff is available to answer your questions during regular business hours (8:30am-5pm).  Please don’t hesitate to call and ask to speak to one of our nurses for clinical concerns.  ° If you have a medical emergency, go to the nearest emergency room or call 911. ° A surgeon from [facility] is always on call at the hospitals ° ° °[facility] °[address], [address], [place], [place]  [address] ? °MAIN: [phone] ? TOLL FREE: [phone] ?  °FAX [phone] °[email or web address] ° °Post Anesthesia Home Care Instructions ° °Activity: °Get plenty of rest for the remainder of the day. A responsible adult should stay with you for 24 hours following the procedure.  °For the next 24 hours, DO NOT: °-Drive a car °-Operate machinery °-Drink alcoholic beverages °-Take any medication unless instructed by your physician °-Make any legal decisions or sign important papers. ° °Meals: °Start with liquid foods such as gelatin or soup. Progress to regular foods as tolerated. Avoid greasy, spicy, heavy foods. If nausea and/or  vomiting occur, drink only clear liquids until the nausea and/or vomiting subsides. Call your physician if vomiting continues. ° °Special Instructions/Symptoms: °Your throat may feel dry or sore from the anesthesia or the breathing tube placed in your throat during surgery. If this causes discomfort, gargle with warm salt water. The discomfort should disappear within 24 hours. ° °If you had a scopolamine patch placed behind your ear for the management of post- operative nausea and/or vomiting: ° °1. The medication in the patch is effective for 72 hours, after which it should be removed.  Wrap patch in a tissue and discard in the trash. Wash hands thoroughly with soap and water. °2. You may remove the patch earlier than 72 hours if you experience unpleasant side effects which may include dry mouth, dizziness or visual disturbances. °3. Avoid touching the patch. Wash your hands with soap and water after contact with the patch. °  ° °

## 2014-07-22 NOTE — Anesthesia Postprocedure Evaluation (Signed)
Anesthesia Post Note  Patient: Savannah [name]  Procedure(s) Performed: Procedure(s) (LRB): EXCISION RIGHT THIGH LIPOMA (Right)  Anesthesia type: General  Patient location: PACU  Post pain: Pain level controlled and Adequate analgesia  Post assessment: Post-op Vital signs reviewed, Patient's Cardiovascular Status Stable, Respiratory Function Stable, Patent Airway and Pain level controlled  Last Vitals:  Filed Vitals:   07/22/14 1700  BP:   Pulse: 87  Temp:   Resp: 14    Post vital signs: Reviewed and stable  Level of consciousness: awake, alert  and oriented  Complications: No apparent anesthesia complications

## 2014-07-23 ENCOUNTER — Encounter (HOSPITAL_BASED_OUTPATIENT_CLINIC_OR_DEPARTMENT_OTHER): Payer: Self-pay | Admitting: Surgery

## 2014-07-23 NOTE — Transfer of Care (Signed)
Immediate Anesthesia Transfer of Care Note  Patient: Savannah [name]  Procedure(s) Performed: Procedure(s): EXCISION RIGHT THIGH LIPOMA (Right)  Patient Location: PACU  Anesthesia Type:General  Level of Consciousness: awake  Airway & Oxygen Therapy: Patient Spontanous Breathing and Patient connected to face mask oxygen  Post-op Assessment: Report given to RN and Post -op Vital signs reviewed and stable  Post vital signs: Reviewed and stable  Last Vitals:  Filed Vitals:   07/22/14 1745  BP: 162/75  Pulse: 83  Temp: 36.2 C  Resp: 16    Complications: No apparent anesthesia complications

## 2015-03-04 ENCOUNTER — Other Ambulatory Visit: Payer: Self-pay | Admitting: Obstetrics & Gynecology

## 2015-03-04 NOTE — Telephone Encounter (Signed)
Medication refill request: Vitamin D3 Last AEX:  12-22-13 Next AEX: 04-15-15 Last MMG (if hormonal medication request): 03-09-14 WNL Refill authorized: please advise

## 2015-03-08 ENCOUNTER — Other Ambulatory Visit: Payer: Self-pay

## 2015-03-08 MED ORDER — PAROXETINE HCL 10 MG PO TABS: 10.0000 mg | ORAL_TABLET | ORAL | Status: DC

## 2015-03-08 NOTE — Telephone Encounter (Signed)
Medication refill request: Paxil  Last AEX: 12/22/2013 MSM Next AEX: 04/15/2015 MSM Last MMG (if hormonal medication request): 03/08/2014 BIRADS Category 1 Negative  Refill authorized: 12/22/13 #90 tabs 4 Refills  Today:  #90 tabs 4 Refills ? Please advise

## 2015-03-09 ENCOUNTER — Other Ambulatory Visit: Payer: Self-pay

## 2015-03-09 DIAGNOSIS — Z1231 Encounter for screening mammogram for malignant neoplasm of breast: Secondary | ICD-10-CM

## 2015-03-19 ENCOUNTER — Other Ambulatory Visit: Payer: Self-pay | Admitting: Obstetrics & Gynecology

## 2015-03-21 NOTE — Telephone Encounter (Signed)
RX for [facility] -sent in- patients mail in pharmacy changed   Was sent into [facility] 03-08-15

## 2015-03-28 ENCOUNTER — Ambulatory Visit
Admission: RE | Admit: 2015-03-28 | Discharge: 2015-03-28 | Disposition: A | Discharge status: Home / Self Care | Payer: BC Managed Care – PPO | Source: Ambulatory Visit

## 2015-03-28 DIAGNOSIS — Z1231 Encounter for screening mammogram for malignant neoplasm of breast: Secondary | ICD-10-CM

## 2015-04-15 ENCOUNTER — Ambulatory Visit (INDEPENDENT_AMBULATORY_CARE_PROVIDER_SITE_OTHER): Payer: BC Managed Care – PPO | Admitting: Obstetrics & Gynecology

## 2015-04-15 ENCOUNTER — Encounter: Payer: Self-pay | Admitting: Obstetrics & Gynecology

## 2015-04-15 VITALS — BP 120/84 | HR 56 | Resp 16 | Ht 64.75 in | Wt 274.0 lb

## 2015-04-15 DIAGNOSIS — R008 Other abnormalities of heart beat: Secondary | ICD-10-CM

## 2015-04-15 DIAGNOSIS — Z01419 Encounter for gynecological examination (general) (routine) without abnormal findings: Secondary | ICD-10-CM | POA: Diagnosis not present

## 2015-04-15 DIAGNOSIS — Z0000 Encounter for general adult medical examination without abnormal findings: Secondary | ICD-10-CM | POA: Diagnosis not present

## 2015-04-15 DIAGNOSIS — I498 Other specified cardiac arrhythmias: Secondary | ICD-10-CM

## 2015-04-15 DIAGNOSIS — Z205 Contact with and (suspected) exposure to viral hepatitis: Secondary | ICD-10-CM

## 2015-04-15 LAB — COMPREHENSIVE METABOLIC PANEL
ALK PHOS: 134 U/L — AB (ref 33–130)
ALT: 26 U/L (ref 6–29)
AST: 22 U/L (ref 10–35)
Albumin: 4 g/dL (ref 3.6–5.1)
BILIRUBIN TOTAL: 0.3 mg/dL (ref 0.2–1.2)
BUN: 15 mg/dL (ref 7–25)
CALCIUM: 9.4 mg/dL (ref 8.6–10.4)
CO2: 31 mmol/L (ref 20–31)
Chloride: 102 mmol/L (ref 98–110)
Creat: 0.85 mg/dL (ref 0.50–1.05)
Glucose, Bld: 89 mg/dL (ref 65–99)
Potassium: 4.5 mmol/L (ref 3.5–5.3)
Sodium: 140 mmol/L (ref 135–146)
TOTAL PROTEIN: 6.8 g/dL (ref 6.1–8.1)

## 2015-04-15 LAB — LIPID PANEL
CHOLESTEROL: 159 mg/dL (ref 125–200)
HDL: 39 mg/dL — ABNORMAL LOW (ref >=46)
LDL Cholesterol: 84 mg/dL (ref <130)
Total CHOL/HDL Ratio: 4.1 Ratio (ref <=5.0)
Triglycerides: 179 mg/dL — ABNORMAL HIGH (ref <150)
VLDL: 36 mg/dL — AB (ref <30)

## 2015-04-15 LAB — CBC
HCT: 41.8 % (ref 36.0–46.0)
Hemoglobin: 13.6 g/dL (ref 12.0–15.0)
MCH: 29.2 pg (ref 26.0–34.0)
MCHC: 32.5 g/dL (ref 30.0–36.0)
MCV: 89.7 fL (ref 78.0–100.0)
MPV: 11.9 fL (ref 8.6–12.4)
PLATELETS: 319 10*3/uL (ref 150–400)
RBC: 4.66 MIL/uL (ref 3.87–5.11)
RDW: 14 % (ref 11.5–15.5)
WBC: 9.6 10*3/uL (ref 4.0–10.5)

## 2015-04-15 LAB — POCT URINALYSIS DIPSTICK
BILIRUBIN UA: NEGATIVE
Blood, UA: NEGATIVE
Glucose, UA: NEGATIVE
Ketones, UA: NEGATIVE
Leukocytes, UA: NEGATIVE
NITRITE UA: NEGATIVE
PH UA: 5
PROTEIN UA: NEGATIVE
UROBILINOGEN UA: NEGATIVE

## 2015-04-15 MED ORDER — VITAMIN D (ERGOCALCIFEROL) 1.25 MG (50000 UNIT) PO CAPS: 50000.0000 [IU] | ORAL_CAPSULE | ORAL | Status: DC

## 2015-04-15 MED ORDER — PAROXETINE HCL 10 MG PO TABS: 10.0000 mg | ORAL_TABLET | Freq: Every morning | ORAL | Status: DC

## 2015-04-15 MED ORDER — CLOBETASOL PROPIONATE 0.05 % EX CREA: 1.0000 "application " | TOPICAL_CREAM | Freq: Two times a day (BID) | CUTANEOUS | Status: DC | PRN

## 2015-04-15 NOTE — Progress Notes (Signed)
55 y.o. G0P0000 MarriedCaucasianF here for annual exam.  In June, pt had lipoma removed.  In recover, trigeminy was seen.  Pt was told to mention this to her provider.  She's telling me this for the first time.  Denies palpitations or SOB.  Neurologist is also concerned about Relpax and trigeminy.  Will refer to cardiology.    Denies vaginal bleeding.  Having a lichen sclerosus flare.  Pt does not have a PCP.   Patient's last menstrual period was 10/14/2010.          Sexually active: Yes.    The current method of family planning is status post hysterectomy.    Exercising: No.  The patient does not participate in regular exercise at present. Smoker:  no  Health Maintenance: Pap: 09/01/10 Neg History of abnormal Pap:  no MMG:  03/29/15 BIRADS1:neg Colonoscopy:  07/2009 Normal - repeat 10 years  BMD:   Never TDaP:  01/2006  Screening Labs: today , Urine today: Negative   reports that she has never smoked. She has never used smokeless tobacco. She reports that she does not drink alcohol or use illicit drugs.  Past Medical History  Diagnosis Date  . Menorrhagia   . Pelvic pain in female   . PONV (postoperative nausea and vomiting)     ponv, slow to awake  . Environmental allergies   . Seasonal allergies   . Headache(784.0)     migraines-on meds for control, relpax, ketoprophen, vicoden, muscle relaxer  . Arthritis     bilateral knees, recent LCL sprain-on left knee  . Sprain 10/25/2010    left knee  . Lichen sclerosus   . Lipoma     Right Knee  . Hearing loss 2016    High frequency hearing - Bilateral   . Trigger finger, right     Middle finger and right wrist   . Trigeminal pulse     Past Surgical History  Procedure Laterality Date  . Ganglion cyst removal    . Functional endoscopic sinus surgery    . Appendectomy    . Diagnostic laparoscopy  08/2006  . Dilation and curettage of uterus  2011    attempted ablation  . Lasik    . Salpingoophorectomy  11/06/2010   Procedure: SALPINGO OOPHERECTOMY;  Surgeon: Felipa Emory;  Location: [facility];  Service: Gynecology;  Laterality: Right;  . Hysteroscopy      failed  . Robotic assisted total hysterectomy  11/06/10    TLH/RSO  . Lipoma excision Right 07/22/2014    Procedure: EXCISION RIGHT THIGH LIPOMA;  Surgeon: Erroll Luna, MD;  Location: [facility];  Service: General;  Laterality: Right;    Current Outpatient Prescriptions  Medication Sig Dispense Refill  . CALCIUM PO Take by mouth.    . clobetasol cream (TEMOVATE) AB-123456789 % Apply 1 application topically 2 (two) times daily as needed. 60 g 1  . diphenhydrAMINE (BENADRYL) 25 MG tablet Take 25 mg by mouth every 6 (six) hours as needed for itching.    . eletriptan (RELPAX) 40 MG tablet One tablet by mouth as needed for migraine headache.  If the headache improves and then returns, dose may be repeated after 2 hours have elapsed since first dose (do not exceed 80 mg per day). may repeat in 2 hours if necessary     . flurbiprofen (ANSAID) 100 MG tablet Take 100 mg by mouth as needed.    [name] glucosamine-chondroitin 500-400 MG tablet Take 1 tablet by mouth  3 (three) times daily.    . Hydrocodone-Acetaminophen (NORCO PO) Take by mouth as needed.    . methocarbamol (ROBAXIN) 750 MG tablet Take 750 mg by mouth 3 (three) times daily. As needed    . multivitamin-lutein (OCUVITE-LUTEIN) CAPS capsule Take 1 capsule by mouth daily.    . Omega-3 Fatty Acids (FISH OIL PO) Take by mouth.    [name] PARoxetine (PAXIL) 10 MG tablet TAKE 1 TABLET EVERY MORNING 90 tablet 3  . Vitamin D, Ergocalciferol, (DRISDOL) 50000 units CAPS capsule TAKE 1 CAPSULE EVERY 7 DAYS 12 capsule 1   No current facility-administered medications for this visit.    Family History  Problem Relation Age of Onset  . Breast cancer Maternal Aunt 70  . Breast cancer Paternal Aunt 54  . Heart disease Father   . Skin cancer Mother     ROS:  Pertinent items are noted in HPI.  Otherwise, a  comprehensive ROS was negative.  Exam:   BP 120/84 mmHg  Pulse 56  Resp 16  Ht 5' 4.75" (1.645 m)  Wt 274 lb (124.286 kg)  BMI 45.93 kg/m2  LMP 10/14/2010  Weight change: +14#  Height: 5' 4.75" (164.5 cm)  Ht Readings from Last 3 Encounters:  04/15/15 5' 4.75" (1.645 m)  07/22/14 5\' 5"  (1.651 m)  12/22/13 5\' 5"  (1.651 m)    General appearance: alert, cooperative and appears stated age Head: Normocephalic, without obvious abnormality, atraumatic Neck: no adenopathy, supple, symmetrical, trachea midline and thyroid normal to inspection and palpation Lungs: clear to auscultation bilaterally Breasts: normal appearance, no masses or tenderness Heart: regular rate and rhythm Abdomen: soft, non-tender; bowel sounds normal; no masses,  no organomegaly Extremities: extremities normal, atraumatic, no cyanosis or edema Skin: Skin color, texture, turgor normal. No rashes or lesions Lymph nodes: Cervical, supraclavicular, and axillary nodes normal. No abnormal inguinal nodes palpated Neurologic: Grossly normal   Pelvic: External genitalia:  Hypopigmentation on left side of perineal body              Urethra:  normal appearing urethra with no masses, tenderness or lesions              Bartholins and Skenes: normal                 Vagina: normal appearing vagina with normal color and discharge, no lesions              Cervix: absent              Pap taken: No. Bimanual Exam:  Uterus:  uterus absent              Adnexa: no mass, fullness, tenderness               Rectovaginal: Confirms               Anus:  normal sphincter tone, no lesions  Chaperone was present for exam.  A:  Well Woman with normal exam H/O Robotic TLH/RSO LS&A, mild on exam today Hot flashes, on paxil with improvement Trigeminy noted on PACU after lipoma remove 6/16  P: Mammogram yearly pap smear not indicated. H/O TLH/RSO. Vit D level today and cont 50,000IU weekly. Rx to pharmacy. Restart clobetasol  0.05% ointment BID x 4 weeks then nightly until follow up. Recheck 8 weeks  CMP, CBC, TSH, Vit D, Lipids Hep C antibody today Referral to cardiology Pt will use clobetasol 0.05 % ointment BID x 14 days.  She needs  to call if symptoms still persist after that time return annually or prn

## 2015-04-16 LAB — VITAMIN D 25 HYDROXY (VIT D DEFICIENCY, FRACTURES): VIT D 25 HYDROXY: 42 ng/mL (ref 30–100)

## 2015-04-16 LAB — HEPATITIS C ANTIBODY: HCV Ab: NEGATIVE

## 2015-04-16 LAB — TSH: TSH: 3.41 m[IU]/L

## 2015-04-19 ENCOUNTER — Telehealth: Payer: Self-pay | Admitting: *Deleted

## 2015-04-19 NOTE — Telephone Encounter (Signed)
Notes Recorded by Elroy Channel, CMA on 04/19/2015 at 9:21 AM LM for pt to call back. Notes Recorded by Megan Salon, MD on 04/18/2015 at 8:39 AM Please inform pt her Vit D is normal. Continue prescription Vit D. CBC, TSH, and Hep C testing was negative.   Her cholesterol is ok but triglycerides are mildly elevated. Watching sugar intake will help this. No treatment needed at this time.  Lastly, her CMP was normal except her Alk phos is mildly elevated. I would like her to follow up with GI. Who did she see for colonoscopy? Thanks.

## 2015-04-21 NOTE — Telephone Encounter (Addendum)
Called patient and was given Dr. Lorie Apley information to call and make her appt. Pt verbalized understanding.   Labs faxed to Dr. Lorie Apley office.

## 2015-04-21 NOTE — Telephone Encounter (Signed)
Patient returning your call.

## 2015-04-21 NOTE — Telephone Encounter (Signed)
Pt can just call for appt as she is already established.  If she had any issues with appt, please ask her to call us.

## 2015-04-21 NOTE — Telephone Encounter (Signed)
Called patient. Notified of results. Per paper chart she had her colonoscopy done with Dr. Collene Mares

## 2015-05-10 ENCOUNTER — Encounter: Payer: Self-pay | Admitting: Obstetrics & Gynecology

## 2015-05-10 MED ORDER — CLOBETASOL PROPIONATE 0.05 % EX CREA: 1.0000 "application " | TOPICAL_CREAM | Freq: Two times a day (BID) | CUTANEOUS | Status: DC | PRN

## 2015-05-10 MED ORDER — PAROXETINE HCL 10 MG PO TABS: 10.0000 mg | ORAL_TABLET | Freq: Every morning | ORAL | Status: DC

## 2015-05-10 MED ORDER — VITAMIN D (ERGOCALCIFEROL) 1.25 MG (50000 UNIT) PO CAPS: 50000.0000 [IU] | ORAL_CAPSULE | ORAL | Status: DC

## 2015-06-06 NOTE — Progress Notes (Signed)
Patient ID: Savannah [name], female   DOB: [birth year], 55 y.o.   MRN: [id number]     Cardiology Office Note   Date:  06/07/2015   ID:  Savannah [name], DOB 03/25/1960, MRN [id number]  PCP:   Melinda Crutch, MD  Cardiologist:   Jenkins Rouge, MD   Chief Complaint  Patient presents with  . Establish Care      History of Present Illness: Savannah [name] is a 55 y.o. female who presents for evaluation of Trigeminy.  Had a lipoma removed in June 16  and noted in recovery to have asymptomatic arrhythmia  On Relpax for migraines  Does use it about twice / month No history of CAD, chest pain, vascular disease or PVD.  She is overweight  Activity currently limited by right Knee injury. I reviewed her recent MRI with her and she has a medial meniscus tear and will likely need arthroscopic surgery. Denies palpitations syncope no high risk Family history of syncope. Dad did have CAD with CABG in his 8's      Past Medical History  Diagnosis Date  . Menorrhagia   . Pelvic pain in female   . PONV (postoperative nausea and vomiting)     ponv, slow to awake  . Environmental allergies   . Seasonal allergies   . Headache(784.0)     migraines-on meds for control, relpax, ketoprophen, vicoden, muscle relaxer  . Arthritis     bilateral knees, recent LCL sprain-on left knee  . Sprain 10/25/2010    left knee  . Lichen sclerosus   . Lipoma     Right Knee  . Hearing loss 2016    High frequency hearing - Bilateral   . Trigger finger, right     Middle finger and right wrist   . Trigeminal pulse     Past Surgical History  Procedure Laterality Date  . Ganglion cyst removal    . Functional endoscopic sinus surgery    . Appendectomy    . Diagnostic laparoscopy  08/2006  . Dilation and curettage of uterus  2011    attempted ablation  . Lasik    . Salpingoophorectomy  11/06/2010    Procedure: SALPINGO OOPHERECTOMY;  Surgeon: Felipa Emory;  Location: [facility];  Service: Gynecology;  Laterality: Right;    . Hysteroscopy      failed  . Robotic assisted total hysterectomy  11/06/10    TLH/RSO  . Lipoma excision Right 07/22/2014    Procedure: EXCISION RIGHT THIGH LIPOMA;  Surgeon: Erroll Luna, MD;  Location: [facility];  Service: General;  Laterality: Right;     Current Outpatient Prescriptions  Medication Sig Dispense Refill  . CALCIUM PO Take 1 tablet by mouth daily.     . clobetasol cream (TEMOVATE) [id number] % Apply 1 application topically as directed.    . diphenhydrAMINE (BENADRYL) 25 MG tablet Take 25 mg by mouth every 6 (six) hours as needed for itching.    . eletriptan (RELPAX) 40 MG tablet Take 40 mg by mouth every 2 (two) hours as needed for migraine or headache.     . flurbiprofen (ANSAID) 100 MG tablet Take 100 mg by mouth as directed.     [name] glucosamine-chondroitin 500-400 MG tablet Take 1 tablet by mouth 3 (three) times daily.    [name] HYDROcodone-acetaminophen (NORCO/VICODIN) 5-325 MG tablet Take 1 tablet by mouth as directed.  0  . methocarbamol (ROBAXIN) 750 MG tablet Take 750 mg by mouth as directed.     [name]  multivitamin-lutein (OCUVITE-LUTEIN) CAPS capsule Take 1 capsule by mouth daily.    . Omega-3 Fatty Acids (FISH OIL PO) Take 1 tablet by mouth daily.     [name] PARoxetine (PAXIL) 10 MG tablet Take 1 tablet (10 mg total) by mouth every morning. 90 tablet 4  . Vitamin D, Ergocalciferol, (DRISDOL) 50000 units CAPS capsule Take 1 capsule (50,000 Units total) by mouth every 7 (seven) days. 12 capsule 4   No current facility-administered medications for this visit.    Allergies:   Dust mite extract    Social History:  The patient  reports that she has never smoked. She has never used smokeless tobacco. She reports that she does not drink alcohol or use illicit drugs.   Family History:  The patient's family history includes Breast cancer (age of onset: 53) in her maternal aunt and paternal aunt; Heart disease in her father; Skin cancer in her mother.    ROS:  Please  see the history of present illness.   Otherwise, review of systems are positive for none.   All other systems are reviewed and negative.    PHYSICAL EXAM: VS:  BP 132/78 mmHg  Pulse 103  Ht 5\' 5"  (1.651 m)  Wt 124.013 kg (273 lb 6.4 oz)  BMI 45.50 kg/m2  SpO2 97%  LMP 10/14/2010 , BMI Body mass index is 45.5 kg/(m^2). Affect appropriate Overweight white female  HEENT: normal Neck supple with no adenopathy JVP normal no bruits no thyromegaly Lungs clear with no wheezing and good diaphragmatic motion Heart:  S1/S2 no murmur, no rub, gallop or click PMI normal Abdomen: benighn, BS positve, no tenderness, no AAA no bruit.  No HSM or HJR Distal pulses intact with no bruits No edema Neuro non-focal Skin warm and dry RLE knee swelling with limited mobility     EKG:  11/06/10  SR rate 88 bigeminy QT 402/486   06/07/15 NSR rate 103 LAE QT 338     Recent Labs: 04/15/2015: ALT 26; BUN 15; Creat 0.85; Hemoglobin 13.6; Platelets 319; Potassium 4.5; Sodium 140; TSH 3.41    Lipid Panel    Component Value Date/Time   CHOL 159 04/15/2015 1512   TRIG 179* 04/15/2015 1512   HDL 39* 04/15/2015 1512   CHOLHDL 4.1 04/15/2015 1512   VLDL 36* 04/15/2015 1512   LDLCALC 84 04/15/2015 1512      Wt Readings from Last 3 Encounters:  06/07/15 124.013 kg (273 lb 6.4 oz)  04/15/15 124.286 kg (274 lb)  07/22/14 117.028 kg (258 lb)      Other studies Reviewed: Additional studies/ records that were reviewed today include: MRI, OR notes lipoma surgery and telemetry strips .    ASSESSMENT AND PLAN:  1.  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  Asymptomatic Evaluate EF with myovue 2) Migraines with frequent relpax use.  Will order calcium score and lexiscan myovue to r/o CAD No carotid bruit or exam signs of PVD.  Not concerned about PVCls as risk of reelpax as a vasoconstrictor is vascular disease not arrhythmia 3. Knee:  F/u ortho risk for DVT with obesity will likely need arthroscopic surgery  had f/u with [place]   Current medicines are reviewed at length with the patient today.  The patient does not have concerns regarding medicines.  The following changes have been made:  no change  Labs/ tests ordered today include: Calcium Score and Lexiscan myovue   No orders of the defined types were placed in this encounter.  Disposition:   FU with me PRN     Signed, Jenkins Rouge, MD  06/07/2015 4:34 PM    [facility] [place], [place], [place]  [address] Phone: [phone]; Fax: [phone]

## 2015-06-07 ENCOUNTER — Encounter: Payer: Self-pay | Admitting: Cardiovascular Disease

## 2015-06-07 ENCOUNTER — Ambulatory Visit (INDEPENDENT_AMBULATORY_CARE_PROVIDER_SITE_OTHER): Payer: BC Managed Care – PPO | Admitting: Cardiovascular Disease

## 2015-06-07 VITALS — BP 132/78 | HR 103 | Ht 65.0 in | Wt 273.4 lb

## 2015-06-07 DIAGNOSIS — Z7189 Other specified counseling: Secondary | ICD-10-CM

## 2015-06-07 DIAGNOSIS — R9431 Abnormal electrocardiogram [ECG] [EKG]: Secondary | ICD-10-CM

## 2015-06-07 DIAGNOSIS — I493 Ventricular premature depolarization: Secondary | ICD-10-CM | POA: Diagnosis not present

## 2015-06-07 DIAGNOSIS — R0600 Dyspnea, unspecified: Secondary | ICD-10-CM

## 2015-06-07 DIAGNOSIS — Z7689 Persons encountering health services in other specified circumstances: Secondary | ICD-10-CM

## 2015-06-07 NOTE — Patient Instructions (Addendum)
Medication Instructions:  Your physician recommends that you continue on your current medications as directed. Please refer to the Current Medication list given to you today.  Labwork: NONE  Testing/Procedures: Your physician has requested that you have a lexiscan myoview. For further information please visit [email or web address]. Please follow instruction sheet, as given.  Cardiac CT scanning for Calcium Score, (CAT scanning), is a noninvasive, special x-ray that produces cross-sectional images of the body using x-rays and a computer. CT scans help physicians diagnose and treat medical conditions. For some CT exams, a contrast material is used to enhance visibility in the area of the body being studied. CT scans provide greater clarity and reveal more details than regular x-ray exams.  Follow-Up: Your physician wants you to follow-up as needed with Dr. Johnsie Cancel.  If you need a refill on your cardiac medications before your next appointment, please call your pharmacy.

## 2015-06-16 ENCOUNTER — Telehealth (HOSPITAL_COMMUNITY): Payer: Self-pay | Admitting: *Deleted

## 2015-06-16 NOTE — Telephone Encounter (Signed)
Left message on voicemail in reference to upcoming appointment scheduled for 06/22/15. Phone number given for a call back so details instructions can be given. Hubbard Robinson, RN

## 2015-06-17 ENCOUNTER — Telehealth (HOSPITAL_COMMUNITY): Payer: Self-pay | Admitting: Radiology

## 2015-06-17 NOTE — Telephone Encounter (Signed)
Patient given detailed instructions per Myocardial Perfusion Study Information Sheet for the test on 06/22/2015 at 12:30. Patient notified to arrive 15 minutes early and that it is imperative to arrive on time for appointment to keep from having the test rescheduled.  If you need to cancel or reschedule your appointment, please call the office within 24 hours of your appointment. Failure to do so may result in a cancellation of your appointment, and a $50 no show fee. Patient verbalized understanding.EHK

## 2015-06-20 ENCOUNTER — Telehealth (HOSPITAL_COMMUNITY): Payer: Self-pay | Admitting: *Deleted

## 2015-06-20 NOTE — Telephone Encounter (Signed)
Left message on voicemail in reference to upcoming appointment scheduled for 06/22/15. Phone number given for a call back so details instructions can be given. Janille Draughon, Ranae Palms

## 2015-06-21 ENCOUNTER — Telehealth (HOSPITAL_COMMUNITY): Payer: Self-pay | Admitting: Radiology

## 2015-06-22 ENCOUNTER — Ambulatory Visit (INDEPENDENT_AMBULATORY_CARE_PROVIDER_SITE_OTHER)
Admission: RE | Admit: 2015-06-22 | Discharge: 2015-06-22 | Disposition: A | Discharge status: Home / Self Care | Payer: Self-pay | Source: Ambulatory Visit | Attending: Cardiovascular Disease | Admitting: Cardiovascular Disease

## 2015-06-22 ENCOUNTER — Ambulatory Visit (HOSPITAL_COMMUNITY): Payer: BC Managed Care – PPO | Attending: Internal Medicine

## 2015-06-22 DIAGNOSIS — R9431 Abnormal electrocardiogram [ECG] [EKG]: Secondary | ICD-10-CM | POA: Diagnosis not present

## 2015-06-22 DIAGNOSIS — Z7689 Persons encountering health services in other specified circumstances: Secondary | ICD-10-CM

## 2015-06-22 DIAGNOSIS — I493 Ventricular premature depolarization: Secondary | ICD-10-CM | POA: Diagnosis not present

## 2015-06-22 DIAGNOSIS — R0600 Dyspnea, unspecified: Secondary | ICD-10-CM

## 2015-06-22 DIAGNOSIS — Z8249 Family history of ischemic heart disease and other diseases of the circulatory system: Secondary | ICD-10-CM | POA: Insufficient documentation

## 2015-06-22 DIAGNOSIS — Z7189 Other specified counseling: Secondary | ICD-10-CM | POA: Diagnosis not present

## 2015-06-22 DIAGNOSIS — R9439 Abnormal result of other cardiovascular function study: Secondary | ICD-10-CM | POA: Insufficient documentation

## 2015-06-22 MED ORDER — TECHNETIUM TC 99M SESTAMIBI GENERIC - CARDIOLITE
31.6000 | Freq: Once | INTRAVENOUS | Status: AC | PRN
  Administered 2015-06-22: 32 via INTRAVENOUS

## 2015-06-22 MED ORDER — REGADENOSON 0.4 MG/5ML IV SOLN
0.4000 mg | Freq: Once | INTRAVENOUS | Status: AC
  Administered 2015-06-22: 0.4 mg via INTRAVENOUS

## 2015-06-23 ENCOUNTER — Ambulatory Visit (HOSPITAL_COMMUNITY): Payer: BC Managed Care – PPO | Attending: Internal Medicine

## 2015-06-23 LAB — MYOCARDIAL PERFUSION IMAGING
CHL CUP NUCLEAR SDS: 6
LV sys vol: 56 mL
LVDIAVOL: 98 mL (ref 46–106)
NUC STRESS TID: 0.82
Peak HR: 109 {beats}/min
RATE: 0.35
Rest HR: 78 {beats}/min
SRS: 5
SSS: 11

## 2015-06-23 MED ORDER — TECHNETIUM TC 99M SESTAMIBI GENERIC - CARDIOLITE
32.6000 | Freq: Once | INTRAVENOUS | Status: AC | PRN
  Administered 2015-06-23: 33 via INTRAVENOUS

## 2015-06-27 ENCOUNTER — Telehealth: Payer: Self-pay | Admitting: Cardiovascular Disease

## 2015-06-27 DIAGNOSIS — R9439 Abnormal result of other cardiovascular function study: Secondary | ICD-10-CM

## 2015-06-27 NOTE — Telephone Encounter (Signed)
Pt returned call to [facility]

## 2015-06-27 NOTE — Telephone Encounter (Signed)
Notes Recorded by Josue Hector, MD on 06/24/2015 at 10:39 AM EF is down needs f/u echo to assess and f/u with me next available       Called patient about her stress test results. Made appointment with Dr. Johnsie Cancel on 07/15/15. Will put in order for echocardiogram before office visit or morning of visit.

## 2015-07-12 ENCOUNTER — Encounter: Payer: Self-pay | Admitting: Cardiovascular Disease

## 2015-07-13 ENCOUNTER — Other Ambulatory Visit: Payer: Self-pay

## 2015-07-13 ENCOUNTER — Ambulatory Visit (HOSPITAL_COMMUNITY): Payer: BC Managed Care – PPO | Attending: Cardiovascular Disease

## 2015-07-13 ENCOUNTER — Telehealth: Payer: Self-pay

## 2015-07-13 DIAGNOSIS — E669 Obesity, unspecified: Secondary | ICD-10-CM | POA: Insufficient documentation

## 2015-07-13 DIAGNOSIS — Z6841 Body Mass Index (BMI) 40.0 and over, adult: Secondary | ICD-10-CM | POA: Diagnosis not present

## 2015-07-13 DIAGNOSIS — R9439 Abnormal result of other cardiovascular function study: Secondary | ICD-10-CM | POA: Diagnosis not present

## 2015-07-13 MED ORDER — CARVEDILOL 3.125 MG PO TABS: 3.1250 mg | ORAL_TABLET | Freq: Two times a day (BID) | ORAL | Status: DC

## 2015-07-13 MED ORDER — LOSARTAN POTASSIUM 25 MG PO TABS: 25.0000 mg | ORAL_TABLET | Freq: Every day | ORAL | Status: DC

## 2015-07-13 NOTE — Telephone Encounter (Signed)
Called patient with echo results. Sent prescriptions to local pharmacy. Patient verbalized understanding and will see Dr. Johnsie Cancel on Friday.

## 2015-07-13 NOTE — Progress Notes (Signed)
Patient ID: Savannah [name], female   DOB: 11/07/1960, 55 y.o.   MRN: [id number]     Cardiology Office Note   Date:  07/15/2015   ID:  Savannah [name], DOB Sep 11, 1960, MRN [id number]  PCP:   Melinda Crutch, MD  Cardiologist:   Jenkins Rouge, MD   Chief Complaint  Patient presents with  . PVC    no sx      History of Present Illness: Savannah [name] is a 55 y.o. female who presents for evaluation of Trigeminy.  Had a lipoma removed in June 16  and noted in recovery to have asymptomatic arrhythmia  On Relpax for migraines  Does use it about twice / month No history of CAD, chest pain, vascular disease or PVD.  She is overweight  Activity currently limited by right Knee injury. I reviewed her recent MRI with her and she has a medial meniscus tear and will likely need arthroscopic surgery. Denies palpitations syncope no high risk Family history of syncope. Dad did have CAD with CABG in his 85's    Myovue done 06/23/15   The left ventricular ejection fraction is moderately decreased (30-44%).  Nuclear stress EF: 43%.  There was no ST segment deviation noted during stress.  This is an intermediate risk study.  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 Menorrhagia   . Pelvic pain in female   . PONV (postoperative nausea and vomiting)     ponv, slow to awake  . Environmental allergies   . Seasonal allergies   . Headache(784.0)     migraines-on meds for control, relpax, ketoprophen, vicoden, muscle relaxer  . Arthritis     bilateral knees, recent LCL sprain-on left knee  . Sprain 10/25/2010    left knee  . Lichen sclerosus   . Lipoma     Right Knee  . Hearing loss 2016    High frequency hearing - Bilateral   . Trigger finger, right     Middle finger and right wrist     . Trigeminal pulse     Past Surgical History  Procedure Laterality Date  . Ganglion cyst removal    . Functional endoscopic sinus surgery    . Appendectomy    . Diagnostic laparoscopy  08/2006  . Dilation and curettage of uterus  2011    attempted ablation  . Lasik    . Salpingoophorectomy  11/06/2010    Procedure: SALPINGO OOPHERECTOMY;  Surgeon: Felipa Emory;  Location: [facility];  Service: Gynecology;  Laterality: Right;  . Hysteroscopy      failed  . Robotic assisted total hysterectomy  11/06/10    TLH/RSO  . Lipoma excision Right 07/22/2014    Procedure: EXCISION RIGHT THIGH LIPOMA;  Surgeon: Erroll Luna, MD;  Location: [facility];  Service: General;  Laterality: Right;     Current Outpatient Prescriptions  Medication Sig Dispense Refill  . CALCIUM PO Take 1 tablet by mouth daily.     . carvedilol (COREG) 3.125 MG tablet Take 1 tablet (3.125 mg total) by mouth 2 (two) times daily. 180 tablet 3  . clobetasol cream (TEMOVATE) AB-123456789 % Apply 1 application topically as directed.    . diphenhydrAMINE (BENADRYL) 25 MG tablet Take 25 mg by  mouth every 6 (six) hours as needed for itching.    . eletriptan (RELPAX) 40 MG tablet Take 40 mg by mouth every 2 (two) hours as needed for migraine or headache.     . flurbiprofen (ANSAID) 100 MG tablet Take 100 mg by mouth as directed.     [name] glucosamine-chondroitin 500-400 MG tablet Take 1 tablet by mouth 3 (three) times daily.    [name] HYDROcodone-acetaminophen (NORCO/VICODIN) 5-325 MG tablet Take 1 tablet by mouth as directed.  0  . losartan (COZAAR) 25 MG tablet Take 1 tablet (25 mg total) by mouth daily. 60 tablet 11  . methocarbamol (ROBAXIN) 750 MG tablet Take 750 mg by mouth as directed.     . multivitamin-lutein (OCUVITE-LUTEIN) CAPS capsule Take 1 capsule by mouth daily.    . Omega-3 Fatty Acids (FISH OIL PO) Take 1 tablet by mouth daily.     [name] PARoxetine (PAXIL) 10 MG tablet Take 1 tablet (10 mg total) by mouth every  morning. 90 tablet 4  . Vitamin D, Ergocalciferol, (DRISDOL) 50000 units CAPS capsule Take 1 capsule (50,000 Units total) by mouth every 7 (seven) days. 12 capsule 4   No current facility-administered medications for this visit.    Allergies:   Dust mite extract    Social History:  The patient  reports that she has never smoked. She has never used smokeless tobacco. She reports that she does not drink alcohol or use illicit drugs.   Family History:  The patient's family history includes Breast cancer (age of onset: 23) in her maternal aunt and paternal aunt; Heart disease in her father; Skin cancer in her mother.    ROS:  Please see the history of present illness.   Otherwise, review of systems are positive for none.   All other systems are reviewed and negative.    PHYSICAL EXAM: VS:  BP 110/76 mmHg  Pulse 90  Ht 5\' 5"  (1.651 m)  Wt 123.56 kg (272 lb 6.4 oz)  BMI 45.33 kg/m2  SpO2 95%  LMP 10/14/2010 , BMI Body mass index is 45.33 kg/(m^2). Affect appropriate Overweight white female  HEENT: normal Neck supple with no adenopathy JVP normal no bruits no thyromegaly Lungs clear with no wheezing and good diaphragmatic motion Heart:  S1/S2 no murmur, no rub, gallop or click PMI normal Abdomen: benighn, BS positve, no tenderness, no AAA no bruit.  No HSM or HJR Distal pulses intact with no bruits No edema Neuro non-focal Skin warm and dry RLE knee swelling with limited mobility     EKG:  11/06/10  SR rate 88 bigeminy QT 402/486   06/07/15 NSR rate 103 LAE QT 338     Recent Labs: 04/15/2015: ALT 26; BUN 15; Creat 0.85; Hemoglobin 13.6; Platelets 319; Potassium 4.5; Sodium 140; TSH 3.41    Lipid Panel    Component Value Date/Time   CHOL 159 04/15/2015 1512   TRIG 179* 04/15/2015 1512   HDL 39* 04/15/2015 1512   CHOLHDL 4.1 04/15/2015 1512   VLDL 36* 04/15/2015 1512   LDLCALC 84 04/15/2015 1512      Wt Readings from Last 3 Encounters:  07/15/15 123.56 kg (272 lb 6.4  oz)  06/22/15 123.832 kg (273 lb)  06/07/15 124.013 kg (273 lb 6.4 oz)      Other studies Reviewed: Additional studies/ records that were reviewed today include: MRI, OR notes lipoma surgery and telemetry strips .    ASSESSMENT AND PLAN:  1.  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  DCM related titrate coreg next visit to 6.25 bid hopefully Low EF:  Started ARB and beta blocker  F/u MRI in 3 months on medical RX Myovue suggests non ischemic DCM 2) Migraines with frequent relpax use.  Will order calcium score and lexiscan myovue to r/o CAD No carotid bruit or exam signs of PVD.  Not concerned about PVCls as risk of reelpax as a vasoconstrictor is vascular disease not arrhythmia 3. Knee:  F/u ortho risk for DVT with obesity ok to proceed with arthroscopic surgery next week    Current medicines are reviewed at length with the patient today.  The patient does not have concerns regarding medicines.  The following changes have been made:  no change  Labs/ tests ordered today include: none  No orders of the defined types were placed in this encounter.     Disposition:   FU with me next available to titrate beta blocker     Signed, Jenkins Rouge, MD  07/15/2015 9:39 AM    [facility] [place], [place], [place]  [address] Phone: [phone]; Fax: [phone]

## 2015-07-13 NOTE — Telephone Encounter (Signed)
-----   Message from Josue Hector, MD sent at 07/13/2015  2:16 PM EDT ----- EF mildly decreased start cozaar 25 and coreg 3.125 bid f/u with me next available

## 2015-07-15 ENCOUNTER — Encounter: Payer: Self-pay | Admitting: Cardiovascular Disease

## 2015-07-15 ENCOUNTER — Ambulatory Visit (INDEPENDENT_AMBULATORY_CARE_PROVIDER_SITE_OTHER): Payer: BC Managed Care – PPO | Admitting: Cardiovascular Disease

## 2015-07-15 VITALS — BP 110/76 | HR 90 | Ht 65.0 in | Wt 272.4 lb

## 2015-07-15 DIAGNOSIS — I493 Ventricular premature depolarization: Secondary | ICD-10-CM | POA: Diagnosis not present

## 2015-07-15 NOTE — Patient Instructions (Addendum)
Medication Instructions:  Your physician recommends that you continue on your current medications as directed. Please refer to the Current Medication list given to you today.  Labwork: NONE  Testing/Procedures: NONE  Follow-Up: Your physician wants you to follow-up in: next available with Dr. Nishan.    If you need a refill on your cardiac medications before your next appointment, please call your pharmacy.    

## 2015-08-12 NOTE — Progress Notes (Signed)
Patient ID: Savannah [name], female   DOB: [birth year], 55 y.o.   MRN: [id number]     Cardiology Office Note   Date:  08/15/2015   ID:  Savannah [name], DOB 1960-12-03, MRN [id number]  PCP:   Melinda Crutch, MD  Cardiologist:   Jenkins Rouge, MD   Chief Complaint  Patient presents with  . PVC    having diahrrea, wondering if new meds caused this, per pt      History of Present Illness: Savannah [name] is a 55 y.o. female who presents for evaluation of Trigeminy.  Had a lipoma removed in June 16  and noted in recovery to have asymptomatic arrhythmia  On Relpax for migraines  Does use it about twice / month No history of CAD, chest pain, vascular disease or PVD.  She is overweight  Activity currently limited by right Knee injury.  MRI - medial meniscus tear cleared for arthroscopic surgery last visit This was done with good result no longer needing to walk with cane   Denies palpitations syncope no high risk Family history of syncope. Dad did have CAD with CABG in his 41's    Myovue done 06/23/15   The left ventricular ejection fraction is moderately decreased (30-44%).  Nuclear stress EF: 43%.  There was no ST segment deviation noted during stress.  This is an intermediate risk study.  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 Menorrhagia   . Pelvic pain in female   . PONV (postoperative nausea and vomiting)     ponv, slow to awake  . Environmental allergies   . Seasonal allergies   . Headache(784.0)     migraines-on meds for control, relpax, ketoprophen, vicoden, muscle relaxer  . Arthritis     bilateral knees, recent LCL sprain-on left knee  . Sprain 10/25/2010    left knee  . Lichen sclerosus   . Lipoma     Right Knee  .  Hearing loss 2016    High frequency hearing - Bilateral   . Trigger finger, right     Middle finger and right wrist   . Trigeminal pulse     Past Surgical History  Procedure Laterality Date  . Ganglion cyst removal    . Functional endoscopic sinus surgery    . Appendectomy    . Diagnostic laparoscopy  08/2006  . Dilation and curettage of uterus  2011    attempted ablation  . Lasik    . Salpingoophorectomy  11/06/2010    Procedure: SALPINGO OOPHERECTOMY;  Surgeon: Felipa Emory;  Location: [facility];  Service: Gynecology;  Laterality: Right;  . Hysteroscopy      failed  . Robotic assisted total hysterectomy  11/06/10    TLH/RSO  . Lipoma excision Right 07/22/2014    Procedure: EXCISION RIGHT THIGH LIPOMA;  Surgeon: Erroll Luna, MD;  Location: [facility];  Service: General;  Laterality: Right;     Current Outpatient Prescriptions  Medication Sig Dispense Refill  . CALCIUM PO Take 1 tablet by mouth daily.     . carvedilol (COREG) 3.125 MG tablet Take 1 tablet (3.125 mg total) by mouth 2 (two) times daily. 180 tablet 3  .  clobetasol cream (TEMOVATE) AB-123456789 % Apply 1 application topically as directed.    . diphenhydrAMINE (BENADRYL) 25 MG tablet Take 25 mg by mouth every 6 (six) hours as needed for itching.    . eletriptan (RELPAX) 40 MG tablet Take 40 mg by mouth every 2 (two) hours as needed for migraine or headache.     . flurbiprofen (ANSAID) 100 MG tablet Take 100 mg by mouth as directed.     [name] glucosamine-chondroitin 500-400 MG tablet Take 1 tablet by mouth 3 (three) times daily.    [name] HYDROcodone-acetaminophen (NORCO/VICODIN) 5-325 MG tablet Take 1 tablet by mouth as directed.  0  . methocarbamol (ROBAXIN) 750 MG tablet Take 750 mg by mouth as directed.     . multivitamin-lutein (OCUVITE-LUTEIN) CAPS capsule Take 1 capsule by mouth daily.    . Omega-3 Fatty Acids (FISH OIL PO) Take 1 tablet by mouth daily.     [name] PARoxetine (PAXIL) 10 MG tablet Take 1 tablet (10 mg  total) by mouth every morning. 90 tablet 4  . Vitamin D, Ergocalciferol, (DRISDOL) 50000 units CAPS capsule Take 1 capsule (50,000 Units total) by mouth every 7 (seven) days. 12 capsule 4  . lisinopril (PRINIVIL,ZESTRIL) 10 MG tablet Take 1 tablet (10 mg total) by mouth daily. 90 tablet 3   No current facility-administered medications for this visit.    Allergies:   Dust mite extract    Social History:  The patient  reports that she has never smoked. She has never used smokeless tobacco. She reports that she does not drink alcohol or use illicit drugs.   Family History:  The patient's family history includes Breast cancer (age of onset: 52) in her maternal aunt and paternal aunt; Heart disease in her father; Skin cancer in her mother.    ROS:  Please see the history of present illness.   Otherwise, review of systems are positive for none.   All other systems are reviewed and negative.    PHYSICAL EXAM: VS:  BP 118/90 mmHg  Pulse 96  Ht 5\' 5"  (1.651 m)  Wt 272 lb (123.378 kg)  BMI 45.26 kg/m2  SpO2 95%  LMP 10/14/2010 , BMI Body mass index is 45.26 kg/(m^2). Affect appropriate Overweight white female  HEENT: normal Neck supple with no adenopathy JVP normal no bruits no thyromegaly Lungs clear with no wheezing and good diaphragmatic motion Heart:  S1/S2 no murmur, no rub, gallop or click PMI normal Abdomen: benighn, BS positve, no tenderness, no AAA no bruit.  No HSM or HJR Distal pulses intact with no bruits No edema Neuro non-focal Skin warm and dry RLE knee swelling with limited mobility     EKG:  11/06/10  SR rate 88 bigeminy QT 402/486   06/07/15 NSR rate 103 LAE QT 338     Recent Labs: 04/15/2015: ALT 26; BUN 15; Creat 0.85; Hemoglobin 13.6; Platelets 319; Potassium 4.5; Sodium 140; TSH 3.41    Lipid Panel    Component Value Date/Time   CHOL 159 04/15/2015 1512   TRIG 179* 04/15/2015 1512   HDL 39* 04/15/2015 1512   CHOLHDL 4.1 04/15/2015 1512   VLDL 36*  04/15/2015 1512   LDLCALC 84 04/15/2015 1512      Wt Readings from Last 3 Encounters:  08/15/15 272 lb (123.378 kg)  07/15/15 272 lb 6.4 oz (123.56 kg)  06/22/15 273 lb (123.832 kg)      Other studies Reviewed: Additional studies/ records that were reviewed today include: MRI, OR notes  lipoma surgery and telemetry strips .    ASSESSMENT AND PLAN:  1.  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  DCM   Myovue suggests non ischemic DCM Continue beta blocker and ARB f/U MRI in May 2018 Change cozaar to lisinopril See if insomnia gets better if not will need to change coreg  2) Migraines relpax use probably ok with non ischemic myovue primarily a vasoconstrictor   3. Knee:  Good result with surgery f/u ortho    Current medicines are reviewed at length with the patient today.  The patient does not have concerns regarding medicines.  The following changes have been made: d/c cozaar start lisinopril   Labs/ tests ordered today include: none  No orders of the defined types were placed in this encounter.     Disposition:   FU with me next available     Signed, Jenkins Rouge, MD  08/15/2015 10:18 AM    [facility], [place], [place]  [address] Phone: [phone]; Fax: [phone]

## 2015-08-15 ENCOUNTER — Ambulatory Visit (INDEPENDENT_AMBULATORY_CARE_PROVIDER_SITE_OTHER): Payer: BC Managed Care – PPO | Admitting: Cardiovascular Disease

## 2015-08-15 ENCOUNTER — Encounter: Payer: Self-pay | Admitting: Cardiovascular Disease

## 2015-08-15 VITALS — BP 118/90 | HR 96 | Ht 65.0 in | Wt 272.0 lb

## 2015-08-15 DIAGNOSIS — I493 Ventricular premature depolarization: Secondary | ICD-10-CM | POA: Diagnosis not present

## 2015-08-15 MED ORDER — LISINOPRIL 10 MG PO TABS: 10.0000 mg | ORAL_TABLET | Freq: Every day | ORAL | Status: DC

## 2015-08-15 NOTE — Patient Instructions (Addendum)
Medication Instructions:  Your physician has recommended you make the following change in your medication:  1-STOP Cozaar  2-START lisinopril 10 mg by mouth daily  Labwork: NONE  Testing/Procedures: NONE  Follow-Up: Your physician wants you to follow-up next available with Dr. Johnsie Cancel.     If you need a refill on your cardiac medications before your next appointment, please call your pharmacy.

## 2015-08-22 ENCOUNTER — Other Ambulatory Visit: Payer: Self-pay | Admitting: Cardiovascular Disease

## 2015-08-24 ENCOUNTER — Other Ambulatory Visit: Payer: Self-pay

## 2015-08-24 MED ORDER — LISINOPRIL 10 MG PO TABS: 10.0000 mg | ORAL_TABLET | Freq: Every day | ORAL | Status: DC

## 2015-09-16 NOTE — Progress Notes (Signed)
Patient ID: Savannah [name], female   DOB: 1961/01/05, 55 y.o.   MRN: [id number]     Cardiology Office Note   Date:  09/19/2015   ID:  Savannah [name], DOB Mar 29, 1960, MRN [id number]  PCP:   Melinda Crutch, MD  Cardiologist:   Jenkins Rouge, MD   Chief Complaint  Patient presents with  . pvc    some tiredness & soreness, has had shingles since last ov, per pt  . Shortness of Breath      History of Present Illness: Savannah [name] is a 55 y.o. female who presents for evaluation of Trigeminy.  Had a lipoma removed in June 16  and noted in recovery to have asymptomatic arrhythmia  On Relpax for migraines  Does use it about twice / month No history of CAD, chest pain, vascular disease or PVD.  She is overweight  Activity currently limited by right Knee injury.  MRI - medial meniscus tear cleared for arthroscopic surgery last visit This was done with good result no longer needing to walk with cane   Denies palpitations syncope no high risk Family history of syncope. Dad did have CAD with CABG in his 21's    Myovue done 06/23/15   The left ventricular ejection fraction is moderately decreased (30-44%).  Nuclear stress EF: 43%.  There was no ST segment deviation noted during stress.  This is an intermediate risk study.  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 Arthritis    bilateral knees, recent LCL sprain-on left knee  . Environmental allergies   . Headache(784.0)    migraines-on meds for control, relpax, ketoprophen, vicoden, muscle relaxer  . Hearing loss 2016   High frequency  hearing - Bilateral   . Lichen sclerosus   . Lipoma    Right Knee  . Menorrhagia   . Pelvic pain in female   . PONV (postoperative nausea and vomiting)    ponv, slow to awake  . Seasonal allergies   . Sprain 10/25/2010   left knee  . Trigeminal pulse   . Trigger finger, right    Middle finger and right wrist     Past Surgical History:  Procedure Laterality Date  . APPENDECTOMY    . DIAGNOSTIC LAPAROSCOPY  08/2006  . DILATION AND CURETTAGE OF UTERUS  2011   attempted ablation  . FUNCTIONAL ENDOSCOPIC SINUS SURGERY    . ganglion cyst removal    . HYSTEROSCOPY     failed  . LASIK    . LIPOMA EXCISION Right 07/22/2014   Procedure: EXCISION RIGHT THIGH LIPOMA;  Surgeon: Erroll Luna, MD;  Location: [facility];  Service: General;  Laterality: Right;  . ROBOTIC ASSISTED TOTAL HYSTERECTOMY  11/06/10   TLH/RSO  . SALPINGOOPHORECTOMY  11/06/2010   Procedure: SALPINGO OOPHERECTOMY;  Surgeon: Felipa Emory;  Location: [facility];  Service: Gynecology;  Laterality: Right;     Current Outpatient Prescriptions  Medication Sig Dispense Refill  . CALCIUM PO Take 1 tablet by mouth daily.     [name]  carvedilol (COREG) 3.125 MG tablet Take 1 tablet (3.125 mg total) by mouth 2 (two) times daily. 180 tablet 3  . clobetasol cream (TEMOVATE) AB-123456789 % Apply 1 application topically as directed.    . diphenhydrAMINE (BENADRYL) 25 MG tablet Take 25 mg by mouth every 6 (six) hours as needed for itching.    . eletriptan (RELPAX) 40 MG tablet Take 40 mg by mouth every 2 (two) hours as needed for migraine or headache.     . flurbiprofen (ANSAID) 100 MG tablet Take 100 mg by mouth as directed.     [name] glucosamine-chondroitin 500-400 MG tablet Take 1 tablet by mouth 3 (three) times daily.    [name] HYDROcodone-acetaminophen (NORCO/VICODIN) 5-325 MG tablet Take 1 tablet by mouth as directed.  0  . lisinopril (PRINIVIL,ZESTRIL) 10 MG tablet Take 1 tablet (10 mg total) by mouth daily. 30 tablet 0  .  methocarbamol (ROBAXIN) 750 MG tablet Take 750 mg by mouth as directed.     . multivitamin-lutein (OCUVITE-LUTEIN) CAPS capsule Take 1 capsule by mouth daily.    . Omega-3 Fatty Acids (FISH OIL PO) Take 1 tablet by mouth daily.     [name] PARoxetine (PAXIL) 10 MG tablet Take 1 tablet (10 mg total) by mouth every morning. 90 tablet 4  . Vitamin D, Ergocalciferol, (DRISDOL) 50000 units CAPS capsule Take 1 capsule (50,000 Units total) by mouth every 7 (seven) days. 12 capsule 4   No current facility-administered medications for this visit.     Allergies:   Dust mite extract    Social History:  The patient  reports that she has never smoked. She has never used smokeless tobacco. She reports that she does not drink alcohol or use drugs.   Family History:  The patient's family history includes Breast cancer (age of onset: 39) in her maternal aunt and paternal aunt; Heart disease in her father; Skin cancer in her mother.    ROS:  Please see the history of present illness.   Otherwise, review of systems are positive for none.   All other systems are reviewed and negative.    PHYSICAL EXAM: VS:  BP 128/73 (BP Location: Right Arm, Patient Position: Sitting, Cuff Size: Large)   Pulse 71   Ht 5\' 5"  (1.651 m)   Wt 274 lb 12.8 oz (124.6 kg)   LMP 10/14/2010   SpO2 95%   BMI 45.73 kg/m  , BMI Body mass index is 45.73 kg/m. Affect appropriate Overweight white female  HEENT: normal Neck supple with no adenopathy JVP normal no bruits no thyromegaly Lungs clear with no wheezing and good diaphragmatic motion Heart:  S1/S2 no murmur, no rub, gallop or click PMI normal Abdomen: benighn, BS positve, no tenderness, no AAA no bruit.  No HSM or HJR Distal pulses intact with no bruits No edema Neuro non-focal Skin warm and dry RLE knee swelling with limited mobility     EKG:  11/06/10  SR rate 88 bigeminy QT 402/486   06/07/15 NSR rate 103 LAE QT 338     Recent Labs: 04/15/2015: ALT 26; BUN 15; Creat  0.85; Hemoglobin 13.6; Platelets 319; Potassium 4.5; Sodium 140; TSH 3.41    Lipid Panel    Component Value Date/Time   CHOL 159 04/15/2015 1512   TRIG 179 (H) 04/15/2015 1512   HDL 39 (L) 04/15/2015 1512   CHOLHDL 4.1 04/15/2015 1512   VLDL 36 (H) 04/15/2015 1512   LDLCALC 84 04/15/2015 1512      Wt Readings  from Last 3 Encounters:  09/19/15 274 lb 12.8 oz (124.6 kg)  08/15/15 272 lb (123.4 kg)  07/15/15 272 lb 6.4 oz (123.6 kg)      Other studies Reviewed: Additional studies/ records that were reviewed today include: MRI, OR notes lipoma surgery and telemetry strips .    ASSESSMENT AND PLAN:  1.  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  DCM   Myovue suggests non ischemic DCM Continue beta blocker and ARB f/U echo 6 months  Continue ACE/Beta Blocker   2) Migraines relpax use probably ok with non ischemic myovue primarily a vasoconstrictor   3. Knee:  Good result with surgery f/u ortho   4. Shingles resolved consider vaccine in future    Current medicines are reviewed at length with the patient today.  The patient does not have concerns regarding medicines.  The following changes have been made: d/c cozaar start lisinopril   Labs/ tests ordered today include: none  No orders of the defined types were placed in this encounter.    Disposition:   FU with me 6 months with echo     Signed, Jenkins Rouge, MD  09/19/2015 8:59 AM    [facility] [place], [place], [place]  [address] Phone: [phone]; Fax: [phone]

## 2015-09-19 ENCOUNTER — Encounter: Payer: Self-pay | Admitting: Cardiovascular Disease

## 2015-09-19 ENCOUNTER — Ambulatory Visit (INDEPENDENT_AMBULATORY_CARE_PROVIDER_SITE_OTHER): Payer: BC Managed Care – PPO | Admitting: Cardiovascular Disease

## 2015-09-19 VITALS — BP 128/73 | HR 71 | Ht 65.0 in | Wt 274.8 lb

## 2015-09-19 DIAGNOSIS — I493 Ventricular premature depolarization: Secondary | ICD-10-CM

## 2015-09-19 DIAGNOSIS — R0600 Dyspnea, unspecified: Secondary | ICD-10-CM | POA: Diagnosis not present

## 2015-09-19 DIAGNOSIS — I429 Cardiomyopathy, unspecified: Secondary | ICD-10-CM

## 2015-09-19 MED ORDER — LISINOPRIL 10 MG PO TABS: 10.0000 mg | ORAL_TABLET | Freq: Every day | ORAL | 3 refills | Status: DC

## 2015-09-19 NOTE — Patient Instructions (Signed)
Medication Instructions:  Your physician recommends that you continue on your current medications as directed. Please refer to the Current Medication list given to you today.   Labwork: None ordered  Testing/Procedures: Your physician has requested that you have an echocardiogram. Echocardiography is a painless test that uses sound waves to create images of your heart. It provides your doctor with information about the size and shape of your heart and how well your heart's chambers and valves are working. This procedure takes approximately one hour. There are no restrictions for this procedure.    Follow-Up: Your physician wants you to follow-up in: 6 months with Dr.Smith You will receive a reminder letter in the mail two months in advance. If you don't receive a letter, please call our office to schedule the follow-up appointment.   Any Other Special Instructions Will Be Listed Below (If Applicable).     If you need a refill on your cardiac medications before your next appointment, please call your pharmacy.   

## 2015-11-17 ENCOUNTER — Ambulatory Visit (INDEPENDENT_AMBULATORY_CARE_PROVIDER_SITE_OTHER): Payer: BC Managed Care – PPO | Admitting: Orthopaedic Surgery

## 2015-11-17 DIAGNOSIS — M1711 Unilateral primary osteoarthritis, right knee: Secondary | ICD-10-CM | POA: Diagnosis not present

## 2015-12-26 ENCOUNTER — Encounter (INDEPENDENT_AMBULATORY_CARE_PROVIDER_SITE_OTHER): Payer: Self-pay | Admitting: Orthopaedic Surgery

## 2015-12-26 ENCOUNTER — Ambulatory Visit (INDEPENDENT_AMBULATORY_CARE_PROVIDER_SITE_OTHER): Payer: BC Managed Care – PPO | Admitting: Orthopaedic Surgery

## 2015-12-26 DIAGNOSIS — M25561 Pain in right knee: Secondary | ICD-10-CM

## 2015-12-26 MED ORDER — LIDOCAINE HCL 1 % IJ SOLN
2.0000 mL | INTRAMUSCULAR | Status: AC | PRN
  Administered 2015-12-26: 2 mL

## 2015-12-26 MED ORDER — BUPIVACAINE HCL 0.5 % IJ SOLN
2.0000 mL | INTRAMUSCULAR | Status: AC | PRN
  Administered 2015-12-26: 2 mL via INTRA_ARTICULAR

## 2015-12-26 MED ORDER — METHYLPREDNISOLONE ACETATE 40 MG/ML IJ SUSP
40.0000 mg | INTRAMUSCULAR | Status: AC | PRN
  Administered 2015-12-26: 40 mg via INTRA_ARTICULAR

## 2015-12-26 NOTE — Progress Notes (Signed)
Office Visit Note   Patient: Savannah [name]           Date of Birth: 19-Jan-1961           MRN: [id number] Visit Date: 12/26/2015              Requested by: Lawerance Cruel, MD [place], [place] [address] PCP: Melinda Crutch, MD   Assessment & Plan: Visit Diagnoses: No diagnosis found.  Plan: Injection into the right has tendon and bursa was performed today under sterile conditions patient tolerated this well. Follow-up as needed.  Follow-Up Instructions: Return if symptoms worsen or fail to improve.   Orders:  No orders of the defined types were placed in this encounter.  No orders of the defined types were placed in this encounter.     Procedures: Large Joint Inj Date/Time: 12/26/2015 7:57 PM Performed by: Leandrew Koyanagi Authorized by: Leandrew Koyanagi   Consent Given by:  Patient Timeout: prior to procedure the correct patient, procedure, and site was verified   Indications:  Pain Location:  Knee Site:  R knee Prep: patient was prepped and draped in usual sterile fashion   Needle Size:  22 G Approach:  Medial Ultrasound Guidance: No   Fluoroscopic Guidance: No   Arthrogram: No   Medications:  2 mL lidocaine 1 %; 2 mL bupivacaine 0.5 %; 40 mg methylPREDNISolone acetate 40 MG/ML     Clinical Data: No additional findings.   Subjective: Chief Complaint  Patient presents with  . Right Knee - Pain    SYNVISC INJ GIVEN 11/17/15    Patient for right knee pain she states that the Synvisc injection has helped but now she still has some pain over the medial aspect of the knee that is tender to palpation feels like there is a lump in the knee. She can bend it just fine without any pain. The pain is mainly from palpation.    Review of Systems  Constitutional: Negative.   HENT: Negative.   Eyes: Negative.   Respiratory: Negative.   Cardiovascular: Negative.   Endocrine: Negative.   Musculoskeletal: Negative.   Neurological: Negative.     Hematological: Negative.   Psychiatric/Behavioral: Negative.   All other systems reviewed and are negative.    Objective: Vital Signs: LMP 10/14/2010   Physical Exam  Right Knee Exam   Comments:  Range of motion is without pain. She is very tender over the has bursa and tendons.  Seals intact. Medial joint line is nontender.      Specialty Comments:  No specialty comments available.  Imaging: No results found.   PMFS History: Patient Active Problem List   Diagnosis Date Noted  . Lichen sclerosus A999333   Past Medical History:  Diagnosis Date  . Arthritis    bilateral knees, recent LCL sprain-on left knee  . Environmental allergies   . Headache(784.0)    migraines-on meds for control, relpax, ketoprophen, vicoden, muscle relaxer  . Hearing loss 2016   High frequency hearing - Bilateral   . Lichen sclerosus   . Lipoma    Right Knee  . Menorrhagia   . Pelvic pain in female   . PONV (postoperative nausea and vomiting)    ponv, slow to awake  . Seasonal allergies   . Sprain 10/25/2010   left knee  . Trigeminal pulse   . Trigger finger, right    Middle finger and right wrist     Family History  Problem Relation Age of Onset  . Skin cancer Mother   . Heart disease Father   . Breast cancer Maternal Aunt 70  . Breast cancer Paternal Aunt 24    Past Surgical History:  Procedure Laterality Date  . APPENDECTOMY    . DIAGNOSTIC LAPAROSCOPY  08/2006  . DILATION AND CURETTAGE OF UTERUS  2011   attempted ablation  . FUNCTIONAL ENDOSCOPIC SINUS SURGERY    . ganglion cyst removal    . HYSTEROSCOPY     failed  . LASIK    . LIPOMA EXCISION Right 07/22/2014   Procedure: EXCISION RIGHT THIGH LIPOMA;  Surgeon: Erroll Luna, MD;  Location: [facility];  Service: General;  Laterality: Right;  . ROBOTIC ASSISTED TOTAL HYSTERECTOMY  11/06/10   TLH/RSO  . SALPINGOOPHORECTOMY  11/06/2010   Procedure: SALPINGO OOPHERECTOMY;  Surgeon: Felipa Emory;   Location: [facility];  Service: Gynecology;  Laterality: Right;   Social History   Occupational History  . Not on file.   Social History Main Topics  . Smoking status: Never Smoker  . Smokeless tobacco: Never Used  . Alcohol use No  . Drug use: No  . Sexual activity: Yes    Birth control/ protection: Surgical     Comment: TLH/RSO

## 2016-01-18 NOTE — Telephone Encounter (Signed)
Encounter opened in error

## 2016-01-30 ENCOUNTER — Encounter (INDEPENDENT_AMBULATORY_CARE_PROVIDER_SITE_OTHER): Payer: Self-pay | Admitting: Orthopaedic Surgery

## 2016-01-30 ENCOUNTER — Ambulatory Visit (INDEPENDENT_AMBULATORY_CARE_PROVIDER_SITE_OTHER): Payer: BC Managed Care – PPO | Admitting: Orthopaedic Surgery

## 2016-01-30 DIAGNOSIS — M76821 Posterior tibial tendinitis, right leg: Secondary | ICD-10-CM

## 2016-01-30 MED ORDER — DICLOFENAC SODIUM 1 % TD GEL: 2.0000 g | Freq: Four times a day (QID) | TRANSDERMAL | 5 refills | Status: DC

## 2016-01-30 NOTE — Progress Notes (Signed)
Office Visit Note   Patient: Savannah [name]           Date of Birth: [birth year]           MRN: [id number] Visit Date: 01/30/2016              Requested by: Lawerance Cruel, MD [place], [place] [address] PCP: Melinda Crutch, MD   Assessment & Plan: Visit Diagnoses:  1. Posterior tibial tendinitis, right leg     Plan: Impression is posterior tibial tendinitis. Voltaren gel prescribed recommend over-the-counter Advil. Patient is not symptomatic enough to warrant a Cam Walker and this may make her knee feel worse also. Ice also. Follow-up as needed.  Follow-Up Instructions: No Follow-up on file.   Orders:  No orders of the defined types were placed in this encounter.  Meds ordered this encounter  Medications  . diclofenac sodium (VOLTAREN) 1 % GEL    Sig: Apply 2 g topically 4 (four) times daily.    Dispense:  1 Tube    Refill:  5      Procedures: No procedures performed   Clinical Data: No additional findings.   Subjective: Chief Complaint  Patient presents with  . Right Knee - Pain  . Right Leg - Pain    Patient is a 55 year old female who comes in for follow-up of right knee pain and new problem of right heel pain. The injection in her pes tendon helped tremendously she states she is about 98% better. She mainly complains of a new complaint of right posterior medial ankle pain up the medial aspect of the calf. This is worse with weightbearing. Feels stiff. Denies any constitutional symptoms.    Review of Systems  Constitutional: Negative.   HENT: Negative.   Eyes: Negative.   Respiratory: Negative.   Cardiovascular: Negative.   Endocrine: Negative.   Musculoskeletal: Negative.   Neurological: Negative.   Hematological: Negative.   Psychiatric/Behavioral: Negative.   All other systems reviewed and are negative.    Objective: Vital Signs: LMP 10/14/2010   Physical Exam  Constitutional: She is oriented to person, place, and time. She  appears well-developed and well-nourished.  Pulmonary/Chest: Effort normal.  Neurological: She is alert and oriented to person, place, and time.  Skin: Skin is warm. Capillary refill takes less than 2 seconds.  Psychiatric: She has a normal mood and affect. Her behavior is normal. Judgment and thought content normal.  Nursing note and vitals reviewed.   Ortho Exam Exam of the right ankle shows tenderness along the posterior tibial tendon proximally up into the calf region. The calf and Achilles are nontender. There is no crepitus or swelling. No signs of infection. She has normal strength and sensation. Specialty Comments:  No specialty comments available.  Imaging: No results found.   PMFS History: Patient Active Problem List   Diagnosis Date Noted  . Posterior tibial tendinitis, right leg 01/30/2016  . Lichen sclerosus [id number]   Past Medical History:  Diagnosis Date  . Arthritis    bilateral knees, recent LCL sprain-on left knee  . Environmental allergies   . Headache(784.0)    migraines-on meds for control, relpax, ketoprophen, vicoden, muscle relaxer  . Hearing loss 2016   High frequency hearing - Bilateral   . Lichen sclerosus   . Lipoma    Right Knee  . Menorrhagia   . Pelvic pain in female   . PONV (postoperative nausea and vomiting)    ponv, slow to awake  .  Seasonal allergies   . Sprain 10/25/2010   left knee  . Trigeminal pulse   . Trigger finger, right    Middle finger and right wrist     Family History  Problem Relation Age of Onset  . Skin cancer Mother   . Heart disease Father   . Breast cancer Maternal Aunt 70  . Breast cancer Paternal Aunt 76    Past Surgical History:  Procedure Laterality Date  . APPENDECTOMY    . DIAGNOSTIC LAPAROSCOPY  08/2006  . DILATION AND CURETTAGE OF UTERUS  2011   attempted ablation  . FUNCTIONAL ENDOSCOPIC SINUS SURGERY    . ganglion cyst removal    . HYSTEROSCOPY     failed  . LASIK    . LIPOMA EXCISION Right  07/22/2014   Procedure: EXCISION RIGHT THIGH LIPOMA;  Surgeon: Erroll Luna, MD;  Location: [facility];  Service: General;  Laterality: Right;  . ROBOTIC ASSISTED TOTAL HYSTERECTOMY  11/06/10   TLH/RSO  . SALPINGOOPHORECTOMY  11/06/2010   Procedure: SALPINGO OOPHERECTOMY;  Surgeon: Felipa Emory;  Location: [facility];  Service: Gynecology;  Laterality: Right;   Social History   Occupational History  . Not on file.   Social History Main Topics  . Smoking status: Never Smoker  . Smokeless tobacco: Never Used  . Alcohol use No  . Drug use: No  . Sexual activity: Yes    Birth control/ protection: Surgical     Comment: TLH/RSO

## 2016-02-13 HISTORY — PX: KNEE ARTHROPLASTY: SHX992

## 2016-02-22 ENCOUNTER — Other Ambulatory Visit: Payer: Self-pay | Admitting: Obstetrics & Gynecology

## 2016-02-22 DIAGNOSIS — Z1231 Encounter for screening mammogram for malignant neoplasm of breast: Secondary | ICD-10-CM

## 2016-03-20 ENCOUNTER — Other Ambulatory Visit (HOSPITAL_COMMUNITY): Payer: BC Managed Care – PPO

## 2016-03-29 ENCOUNTER — Ambulatory Visit
Admission: RE | Admit: 2016-03-29 | Discharge: 2016-03-29 | Disposition: A | Discharge status: Home / Self Care | Payer: BC Managed Care – PPO | Source: Ambulatory Visit | Attending: Obstetrics & Gynecology | Admitting: Obstetrics & Gynecology

## 2016-03-29 DIAGNOSIS — Z1231 Encounter for screening mammogram for malignant neoplasm of breast: Secondary | ICD-10-CM

## 2016-04-22 ENCOUNTER — Other Ambulatory Visit: Payer: Self-pay | Admitting: Cardiovascular Disease

## 2016-04-22 ENCOUNTER — Other Ambulatory Visit: Payer: Self-pay | Admitting: Obstetrics & Gynecology

## 2016-04-23 NOTE — Telephone Encounter (Signed)
Medication refill request: Vitamin D Last AEX:  04/15/15 SM Next AEX: 08/03/16 SM Last MMG (if hormonal medication request): 03/29/16 BIRADS1, Density B, TBC Refill authorized: 05/10/15 #12 4R. Please advise. Thank you.

## 2016-04-23 NOTE — Progress Notes (Signed)
Patient ID: Savannah [name], female   DOB: [birth year], 56 y.o.   MRN: [id number]     Cardiology Office Note   Date:  05/01/2016   ID:  Savannah [name], DOB [birth year], MRN [id number]  PCP:  Melinda Crutch, MD  Cardiologist:   Jenkins Rouge, MD   No chief complaint on file.     History of Present Illness: Savannah [name] is a 56 y.o. female who presents for evaluation of Trigeminy.  Had a lipoma removed in June 16  and noted in recovery to have asymptomatic arrhythmia  On Relpax for migraines  Does use it about twice / month No history of CAD, chest pain, vascular disease or PVD.  She is overweight  Activity currently limited by right Knee injury.  MRI - medial meniscus tear cleared for arthroscopic surgery last visit This was done with good result no longer needing to walk with cane   Denies palpitations syncope no high risk Family history of syncope. Dad did have CAD with CABG in his 64's    Myovue done 06/23/15   The left ventricular ejection fraction is moderately decreased (30-44%).  Nuclear stress EF: 43%.  There was no ST segment deviation noted during stress.  This is an intermediate risk study.  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 Arthritis    bilateral knees, recent LCL sprain-on left knee  . Environmental allergies   . Headache(784.0)    migraines-on meds for control, relpax, ketoprophen, vicoden, muscle relaxer  . Hearing loss 2016   High frequency hearing - Bilateral   . Lichen sclerosus   .  Lipoma    Right Knee  . Menorrhagia   . Pelvic pain in female   . PONV (postoperative nausea and vomiting)    ponv, slow to awake  . Seasonal allergies   . Sprain 10/25/2010   left knee  . Trigeminal pulse   . Trigger finger, right    Middle finger and right wrist     Past Surgical History:  Procedure Laterality Date  . APPENDECTOMY    . DIAGNOSTIC LAPAROSCOPY  08/2006  . DILATION AND CURETTAGE OF UTERUS  2011   attempted ablation  . FUNCTIONAL ENDOSCOPIC SINUS SURGERY    . ganglion cyst removal    . HYSTEROSCOPY     failed  . LASIK    . LIPOMA EXCISION Right 07/22/2014   Procedure: EXCISION RIGHT THIGH LIPOMA;  Surgeon: Erroll Luna, MD;  Location: [facility];  Service: General;  Laterality: Right;  . ROBOTIC ASSISTED TOTAL HYSTERECTOMY  11/06/10   TLH/RSO  . SALPINGOOPHORECTOMY  11/06/2010   Procedure: SALPINGO OOPHERECTOMY;  Surgeon: Felipa Emory;  Location: [facility];  Service: Gynecology;  Laterality: Right;     Current Outpatient Prescriptions  Medication Sig Dispense Refill  . CALCIUM PO Take 1 tablet by mouth daily.     . carvedilol (COREG) 3.125 MG tablet Take 1 tablet (3.125 mg total)  by mouth 2 (two) times daily. 180 tablet 3  . clobetasol cream (TEMOVATE) 2.54 % Apply 1 application topically as directed.    . diclofenac sodium (VOLTAREN) 1 % GEL Apply 2 g topically 4 (four) times daily. 1 Tube 5  . diphenhydrAMINE (BENADRYL) 25 MG tablet Take 25 mg by mouth every 6 (six) hours as needed for itching.    . eletriptan (RELPAX) 40 MG tablet Take 40 mg by mouth every 2 (two) hours as needed for migraine or headache.     . flurbiprofen (ANSAID) 100 MG tablet Take 100 mg by mouth as directed.     [name] glucosamine-chondroitin 500-400 MG tablet Take 1 tablet by mouth 3 (three) times daily.    [name] HYDROcodone-acetaminophen (NORCO/VICODIN) 5-325 MG tablet Take 1 tablet by mouth as directed.  0  . lisinopril (PRINIVIL,ZESTRIL) 10 MG tablet Take 1 tablet (10 mg  total) by mouth daily. 90 tablet 0  . methocarbamol (ROBAXIN) 750 MG tablet Take 750 mg by mouth as directed.     . multivitamin-lutein (OCUVITE-LUTEIN) CAPS capsule Take 1 capsule by mouth daily.    . Omega-3 Fatty Acids (FISH OIL PO) Take 1 tablet by mouth daily.     [name] PARoxetine (PAXIL) 10 MG tablet Take 1 tablet (10 mg total) by mouth every morning. 90 tablet 4  . Vitamin D, Ergocalciferol, (DRISDOL) 50000 units CAPS capsule TAKE 1 CAPSULE EVERY 7 DAYS 12 capsule 4   No current facility-administered medications for this visit.     Allergies:   Dust mite extract    Social History:  The patient  reports that she has never smoked. She has never used smokeless tobacco. She reports that she does not drink alcohol or use drugs.   Family History:  The patient's family history includes Breast cancer (age of onset: 42) in her maternal aunt and paternal aunt; Heart disease in her father; Skin cancer in her mother.    ROS:  Please see the history of present illness.   Otherwise, review of systems are positive for none.   All other systems are reviewed and negative.    PHYSICAL EXAM: VS:  BP 132/72   Pulse 78   Ht 5\' 5"  (1.651 m)   Wt 270 lb (122.5 kg)   LMP 10/14/2010   SpO2 97%   BMI 44.93 kg/m  , BMI Body mass index is 44.93 kg/m. Affect appropriate Overweight white female  HEENT: normal Neck supple with no adenopathy JVP normal no bruits no thyromegaly Lungs clear with no wheezing and good diaphragmatic motion Heart:  S1/S2 no murmur, no rub, gallop or click PMI normal Abdomen: benighn, BS positve, no tenderness, no AAA no bruit.  No HSM or HJR Distal pulses intact with no bruits No edema Neuro non-focal Skin warm and dry RLE knee swelling with limited mobility     EKG:  11/06/10  SR rate 88 bigeminy QT 402/486   06/07/15 NSR rate 103 LAE QT 338     Recent Labs: No results found for requested labs within last 8760 hours.    Lipid Panel    Component Value Date/Time     CHOL 159 04/15/2015 1512   TRIG 179 (H) 04/15/2015 1512   HDL 39 (L) 04/15/2015 1512   CHOLHDL 4.1 04/15/2015 1512   VLDL 36 (H) 04/15/2015 1512   LDLCALC 84 04/15/2015 1512      Wt Readings from Last 3 Encounters:  05/01/16 270 lb (122.5 kg)  09/19/15 274 lb 12.8 oz (  124.6 kg)  08/15/15 272 lb (123.4 kg)      Other studies Reviewed: Additional studies/ records that were reviewed today include: MRI, OR notes lipoma surgery and telemetry strips .    ASSESSMENT AND PLAN:  1.  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  DCM   Myovue suggests non ischemic DCM Continue beta blocker and ACE ? Cozaar caused insomnia Echo today EF stable 40-45%   2) Migraines relpax use probably ok with non ischemic myovue primarily a vasoconstrictor   3. Knee:  Good result with surgery f/u ortho   4. Shingles resolved consider vaccine in future       Signed, Jenkins Rouge, MD  05/01/2016 2:23 PM    [facility] [address], [place], [place]  [address] Phone: [phone]; Fax: [phone]

## 2016-04-24 ENCOUNTER — Encounter: Payer: Self-pay | Admitting: Cardiovascular Disease

## 2016-05-01 ENCOUNTER — Ambulatory Visit (INDEPENDENT_AMBULATORY_CARE_PROVIDER_SITE_OTHER): Payer: BC Managed Care – PPO | Admitting: Cardiovascular Disease

## 2016-05-01 ENCOUNTER — Other Ambulatory Visit: Payer: Self-pay

## 2016-05-01 ENCOUNTER — Ambulatory Visit (HOSPITAL_COMMUNITY): Payer: BC Managed Care – PPO | Attending: Cardiology

## 2016-05-01 VITALS — BP 132/72 | HR 78 | Ht 65.0 in | Wt 270.0 lb

## 2016-05-01 DIAGNOSIS — I5022 Chronic systolic (congestive) heart failure: Secondary | ICD-10-CM | POA: Diagnosis not present

## 2016-05-01 DIAGNOSIS — I081 Rheumatic disorders of both mitral and tricuspid valves: Secondary | ICD-10-CM | POA: Diagnosis not present

## 2016-05-01 DIAGNOSIS — I429 Cardiomyopathy, unspecified: Secondary | ICD-10-CM

## 2016-05-01 LAB — ECHOCARDIOGRAM COMPLETE
AOASC: 36 cm
CHL CUP DOP CALC LVOT VTI: 15.8 cm
E decel time: 225 msec
EERAT: 9.15
FS: 6 % — AB (ref 28–44)
IVS/LV PW RATIO, ED: 0.91
LA vol A4C: 31 ml
LA vol index: 14.6 mL/m2
LA vol: 33 mL
LADIAMINDEX: 1.55 cm/m2
LASIZE: 35 mm
LEFT ATRIUM END SYS DIAM: 35 mm
LV E/e' medial: 9.15
LVEEAVG: 9.15
LVELAT: 6.58 cm/s
LVOT area: 3.46 cm2
LVOT diameter: 21 mm
LVOT peak grad rest: 3 mmHg
LVOTPV: 83.5 cm/s
LVOTSV: 55 mL
MV Dec: 225
MVPKAVEL: 105 m/s
MVPKEVEL: 60.2 m/s
PW: 10.4 mm — AB (ref 0.6–1.1)
TDI e' lateral: 6.58
TDI e' medial: 5.26

## 2016-05-01 NOTE — Patient Instructions (Signed)

## 2016-05-24 ENCOUNTER — Encounter (INDEPENDENT_AMBULATORY_CARE_PROVIDER_SITE_OTHER): Payer: Self-pay | Admitting: Orthopaedic Surgery

## 2016-05-24 ENCOUNTER — Ambulatory Visit (INDEPENDENT_AMBULATORY_CARE_PROVIDER_SITE_OTHER): Payer: BC Managed Care – PPO | Admitting: Orthopaedic Surgery

## 2016-05-24 DIAGNOSIS — G8929 Other chronic pain: Secondary | ICD-10-CM | POA: Insufficient documentation

## 2016-05-24 DIAGNOSIS — M25561 Pain in right knee: Secondary | ICD-10-CM

## 2016-05-24 DIAGNOSIS — M1711 Unilateral primary osteoarthritis, right knee: Secondary | ICD-10-CM | POA: Diagnosis not present

## 2016-05-24 DIAGNOSIS — M7051 Other bursitis of knee, right knee: Secondary | ICD-10-CM

## 2016-05-24 MED ORDER — METHYLPREDNISOLONE ACETATE 40 MG/ML IJ SUSP
40.0000 mg | INTRAMUSCULAR | Status: AC | PRN
  Administered 2016-05-24: 40 mg via INTRA_ARTICULAR

## 2016-05-24 MED ORDER — LIDOCAINE HCL 1 % IJ SOLN
2.0000 mL | INTRAMUSCULAR | Status: AC | PRN
  Administered 2016-05-24: 2 mL

## 2016-05-24 MED ORDER — DICLOFENAC SODIUM 1 % TD GEL: 2.0000 g | Freq: Four times a day (QID) | TRANSDERMAL | 5 refills | Status: DC

## 2016-05-24 MED ORDER — BUPIVACAINE HCL 0.5 % IJ SOLN
2.0000 mL | INTRAMUSCULAR | Status: AC | PRN
  Administered 2016-05-24: 2 mL via INTRA_ARTICULAR

## 2016-05-24 NOTE — Progress Notes (Signed)
Office Visit Note   Patient: Savannah [name]           Date of Birth: 1960/03/29           MRN: [id number] Visit Date: 05/24/2016              Requested by: Lawerance Cruel, MD [place],  [id number] PCP: Melinda Crutch, MD   Assessment & Plan: Visit Diagnoses:  1. Unilateral primary osteoarthritis, right knee   2. Pes anserinus bursitis of right knee     Plan: pes anserine bursa injection was provided today. Patient tolerated this well. Voltaren gel prescribed for her right knee osteoarthritis. Follow-up with me as needed.  Follow-Up Instructions: Return if symptoms worsen or fail to improve.   Orders:  No orders of the defined types were placed in this encounter.  Meds ordered this encounter  Medications  . diclofenac sodium (VOLTAREN) 1 % GEL    Sig: Apply 2 g topically 4 (four) times daily.    Dispense:  1 Tube    Refill:  5      Procedures: Large Joint Inj Date/Time: 05/24/2016 6:05 PM Performed by: Leandrew Koyanagi Authorized by: Leandrew Koyanagi   Consent Given by:  Patient Timeout: prior to procedure the correct patient, procedure, and site was verified   Indications:  Pain Location:  Knee Site:  R knee Prep: patient was prepped and draped in usual sterile fashion   Needle Size:  22 G Approach:  Medial Ultrasound Guidance: No   Fluoroscopic Guidance: No   Arthrogram: No   Medications:  2 mL lidocaine 1 %; 2 mL bupivacaine 0.5 %; 40 mg methylPREDNISolone acetate 40 MG/ML     Clinical Data: No additional findings.   Subjective: Chief Complaint  Patient presents with  . Right Knee - Pain    Savannah [name] comes back today for another injection of her has bursitis. She had 5 months of relief from the previous injection and is requesting another one. She is also having issues with her chronic knee osteoarthritis which she has tried multiple oral NSAIDs for. She is requesting Voltaren gel for the knee osteoarthritis.    Review of  Systems   Objective: Vital Signs: LMP 10/14/2010   Physical Exam  Ortho Exam Right knee exam shows tenderness at the pes anserine bursa. Specialty Comments:  No specialty comments available.  Imaging: No results found.   PMFS History: Patient Active Problem List   Diagnosis Date Noted  . Chronic pain of right knee 05/24/2016  . Unilateral primary osteoarthritis, right knee 05/24/2016  . Posterior tibial tendinitis, right leg 01/30/2016  . Lichen sclerosus 19/37/9024   Past Medical History:  Diagnosis Date  . Arthritis    bilateral knees, recent LCL sprain-on left knee  . Environmental allergies   . Headache(784.0)    migraines-on meds for control, relpax, ketoprophen, vicoden, muscle relaxer  . Hearing loss 2016   High frequency hearing - Bilateral   . Lichen sclerosus   . Lipoma    Right Knee  . Menorrhagia   . Pelvic pain in female   . PONV (postoperative nausea and vomiting)    ponv, slow to awake  . Seasonal allergies   . Sprain 10/25/2010   left knee  . Trigeminal [name]   . Trigger finger, right    Middle finger and right wrist     Family History  Problem Relation Age of Onset  . Skin cancer Mother   .  Heart disease Father   . Breast cancer Maternal Aunt 70  . Breast cancer Paternal Aunt 96    Past Surgical History:  Procedure Laterality Date  . APPENDECTOMY    . DIAGNOSTIC LAPAROSCOPY  08/2006  . DILATION AND CURETTAGE OF UTERUS  2011   attempted ablation  . FUNCTIONAL ENDOSCOPIC SINUS SURGERY    . ganglion cyst removal    . HYSTEROSCOPY     failed  . LASIK    . LIPOMA EXCISION Right 07/22/2014   Procedure: EXCISION RIGHT THIGH LIPOMA;  Surgeon: Erroll Luna, MD;  Location: [facility];  Service: General;  Laterality: Right;  . ROBOTIC ASSISTED TOTAL HYSTERECTOMY  11/06/10   TLH/RSO  . SALPINGOOPHORECTOMY  11/06/2010   Procedure: SALPINGO OOPHERECTOMY;  Surgeon: Felipa Emory;  Location: [facility];  Service: Gynecology;   Laterality: Right;   Social History   Occupational History  . Not on file.   Social History Main Topics  . Smoking status: Never Smoker  . Smokeless tobacco: Never Used  . Alcohol use No  . Drug use: No  . Sexual activity: Yes    Birth control/ protection: Surgical     Comment: TLH/RSO

## 2016-06-06 ENCOUNTER — Telehealth (INDEPENDENT_AMBULATORY_CARE_PROVIDER_SITE_OTHER): Payer: Self-pay | Admitting: Orthopaedic Surgery

## 2016-06-06 NOTE — Telephone Encounter (Signed)
Called pt back to let her know we have received Fax today regarding Voltaren gel being denied. She states ins needs more info and something needs to be filled out. I am not sure what is needed. I advised her to give me a number so I can call to see what is needed.  [facility]  [phone]  "Voltaren Gel "  I will call them to see what is needed.

## 2016-06-06 NOTE — Telephone Encounter (Signed)
Pt called about her RX for the Voltaren Gel, she stated her insurance wouldn't approve and that they sent [place] over something for Dr. Erlinda Hong to fill out about this...  Please advise pt.  [phone]

## 2016-06-06 NOTE — Telephone Encounter (Signed)
Rx number [id number]

## 2016-06-07 NOTE — Telephone Encounter (Signed)
April 26. 2021 WT#[id number]

## 2016-06-07 NOTE — Telephone Encounter (Signed)
Re did PA over the phone.  It was approved.

## 2016-07-18 ENCOUNTER — Other Ambulatory Visit: Payer: Self-pay | Admitting: Cardiovascular Disease

## 2016-07-18 ENCOUNTER — Other Ambulatory Visit: Payer: Self-pay | Admitting: Obstetrics & Gynecology

## 2016-07-18 NOTE — Telephone Encounter (Signed)
Medication refill request: Paxil & clobetasol cream  Last AEX:  04-15-15  Next AEX: 08-03-16  Last MMG (if hormonal medication request): 03-30-16 WNL  Refill authorized: please advise  Sending to JJ since SM is out of the office

## 2016-07-22 ENCOUNTER — Other Ambulatory Visit: Payer: Self-pay | Admitting: Cardiovascular Disease

## 2016-08-03 ENCOUNTER — Encounter: Payer: Self-pay | Admitting: Obstetrics & Gynecology

## 2016-08-03 ENCOUNTER — Ambulatory Visit (INDEPENDENT_AMBULATORY_CARE_PROVIDER_SITE_OTHER): Payer: BC Managed Care – PPO | Admitting: Obstetrics & Gynecology

## 2016-08-03 VITALS — BP 122/90 | HR 72 | Resp 16 | Ht 64.75 in | Wt 269.0 lb

## 2016-08-03 DIAGNOSIS — Z23 Encounter for immunization: Secondary | ICD-10-CM

## 2016-08-03 DIAGNOSIS — Z0000 Encounter for general adult medical examination without abnormal findings: Secondary | ICD-10-CM | POA: Diagnosis not present

## 2016-08-03 DIAGNOSIS — B029 Zoster without complications: Secondary | ICD-10-CM

## 2016-08-03 DIAGNOSIS — Z01419 Encounter for gynecological examination (general) (routine) without abnormal findings: Secondary | ICD-10-CM

## 2016-08-03 DIAGNOSIS — K580 Irritable bowel syndrome with diarrhea: Secondary | ICD-10-CM | POA: Diagnosis not present

## 2016-08-03 MED ORDER — HYOSCYAMINE SULFATE 0.125 MG PO TABS: 0.1250 mg | ORAL_TABLET | ORAL | 1 refills | Status: DC | PRN

## 2016-08-03 MED ORDER — GABAPENTIN 600 MG PO TABS: ORAL_TABLET | ORAL | 0 refills | Status: DC

## 2016-08-03 MED ORDER — PAROXETINE HCL 10 MG PO TABS: 10.0000 mg | ORAL_TABLET | Freq: Every morning | ORAL | 4 refills | Status: DC

## 2016-08-03 MED ORDER — CYCLOBENZAPRINE HCL 10 MG PO TABS
10.0000 mg | ORAL_TABLET | Freq: Three times a day (TID) | ORAL | 0 refills | Status: DC | PRN
Start: 2016-08-03 — End: 2016-09-25

## 2016-08-03 NOTE — Progress Notes (Signed)
56 y.o. G0P0000 MarriedCaucasianF here for annual exam.  Saw Dr. Johnsie Cancel last year and again this year.  EF is reduced so has been diagnosed with chronic systolic congestive heart failure.    Patient's last menstrual period was 10/14/2010.          Sexually active: Yes.    The current method of family planning is status post hysterectomy.    Exercising: No.  The patient does not participate in regular exercise at present. Smoker:  no  Health Maintenance: Pap:  09/01/10 Neg  History of abnormal Pap:  no MMG:  03/30/16 BIRADS1:neg  Colonoscopy:  07/2009 Normal. F/u 10 years  BMD:   never TDaP:  01/2006 Pneumonia vaccine(s):  No Zostavax:   No Hep C testing: 04/15/15 Neg  Screening Labs: Will discuss    reports that she has never smoked. She has never used smokeless tobacco. She reports that she does not drink alcohol or use drugs.  Past Medical History:  Diagnosis Date  . Arthritis    bilateral knees, recent LCL sprain-on left knee  . Environmental allergies   . Headache(784.0)    migraines-on meds for control, relpax, ketoprophen, vicoden, muscle relaxer  . Hearing loss 2016   High frequency hearing - Bilateral   . Lichen sclerosus   . Lipoma    Right Knee  . Menorrhagia   . Pelvic pain in female   . PONV (postoperative nausea and vomiting)    ponv, slow to awake  . Seasonal allergies   . Sprain 10/25/2010   left knee  . Trigeminal pulse   . Trigger finger, right    Middle finger and right wrist     Past Surgical History:  Procedure Laterality Date  . APPENDECTOMY    . DIAGNOSTIC LAPAROSCOPY  08/2006  . DILATION AND CURETTAGE OF UTERUS  2011   attempted ablation  . FUNCTIONAL ENDOSCOPIC SINUS SURGERY    . ganglion cyst removal    . HYSTEROSCOPY     failed  . LASIK    . LIPOMA EXCISION Right 07/22/2014   Procedure: EXCISION RIGHT THIGH LIPOMA;  Surgeon: Erroll Luna, MD;  Location: [facility];  Service: General;  Laterality: Right;  . ROBOTIC ASSISTED  TOTAL HYSTERECTOMY  11/06/10   TLH/RSO  . SALPINGOOPHORECTOMY  11/06/2010   Procedure: SALPINGO OOPHERECTOMY;  Surgeon: Felipa Emory;  Location: [facility];  Service: Gynecology;  Laterality: Right;    Current Outpatient Prescriptions  Medication Sig Dispense Refill  . CALCIUM PO Take 1 tablet by mouth daily.     . carvedilol (COREG) 3.125 MG tablet take 1 tablet by mouth twice a day 180 tablet 2  . clobetasol cream (TEMOVATE) 0.05 % Apply a pea sized amount topically BID for 1-2 weeks as needed, not for daily long term use. 60 g 0  . diclofenac sodium (VOLTAREN) 1 % GEL Apply 2 g topically 4 (four) times daily. 1 Tube 5  . eletriptan (RELPAX) 40 MG tablet Take 40 mg by mouth every 2 (two) hours as needed for migraine or headache.     . flurbiprofen (ANSAID) 100 MG tablet Take 100 mg by mouth as directed.     [name] glucosamine-chondroitin 500-400 MG tablet Take 1 tablet by mouth 3 (three) times daily.    [name] HYDROcodone-acetaminophen (NORCO/VICODIN) 5-325 MG tablet Take 1 tablet by mouth as directed.  0  . lisinopril (PRINIVIL,ZESTRIL) 10 MG tablet Take 1 tablet (10 mg total) by mouth daily. 90 tablet 2  .  methocarbamol (ROBAXIN) 750 MG tablet Take 750 mg by mouth as directed.     . multivitamin-lutein (OCUVITE-LUTEIN) CAPS capsule Take 1 capsule by mouth daily.    . Omega-3 Fatty Acids (FISH OIL PO) Take 1 tablet by mouth daily.     [name] PARoxetine (PAXIL) 10 MG tablet Take 1 tablet (10 mg total) by mouth every morning. 90 tablet 4  . Vitamin D, Ergocalciferol, (DRISDOL) 50000 units CAPS capsule TAKE 1 CAPSULE EVERY 7 DAYS 12 capsule 4   No current facility-administered medications for this visit.     Family History  Problem Relation Age of Onset  . Skin cancer Mother   . Heart disease Father   . Breast cancer Maternal Aunt 70  . Breast cancer Paternal Aunt 54    ROS:  Pertinent items are noted in HPI.  Otherwise, a comprehensive ROS was negative.  Exam:   BP 122/90 (BP Location: Left Arm,  Patient Position: Sitting, Cuff Size: Large)   Pulse 72   Resp 16   Ht 5' 4.75" (1.645 m)   Wt 269 lb (122 kg)   LMP 10/14/2010   BMI 45.11 kg/m   Weight change: -6# Height: 5' 4.75" (164.5 cm)  Ht Readings from Last 3 Encounters:  08/03/16 5' 4.75" (1.645 m)  05/01/16 5\' 5"  (1.651 m)  09/19/15 5\' 5"  (1.651 m)    General appearance: alert, cooperative and appears stated age Head: Normocephalic, without obvious abnormality, atraumatic Neck: no adenopathy, supple, symmetrical, trachea midline and thyroid normal to inspection and palpation Lungs: clear to auscultation bilaterally Breasts: normal appearance, no masses or tenderness Heart: regular rate and rhythm Abdomen: soft, non-tender; bowel sounds normal; no masses,  no organomegaly Extremities: extremities normal, atraumatic, no cyanosis or edema Skin: Skin color, texture, turgor normal. No rashes or lesions Lymph nodes: Cervical, supraclavicular, and axillary nodes normal. No abnormal inguinal nodes palpated Neurologic: Grossly normal   Pelvic: External genitalia:  no lesions              Urethra:  normal appearing urethra with no masses, tenderness or lesions              Bartholins and Skenes: normal                 Vagina: normal appearing vagina with normal color and discharge, no lesions              Cervix: absent              Pap taken: No. Bimanual Exam:  Uterus:  uterus absent              Adnexa: no mass, fullness, tenderness               Rectovaginal: Confirms               Anus:  normal sphincter tone, no lesions  Chaperone was present for exam.  A:  Well Woman with normal exam H/O robotic TLH/RSO New diagnosis of chronic systolic congestive heart failure--followed by Dr. Johnsie Cancel yearly LS&A, minimal changes today H/O trigeminy noted in PACU after lipoma removal 6/16 Shingles, 2017, still with some neuropathic pain/sensations IBS  P:   Mammogram guidelines reviewed Pap not indicated Gabapentin 300mg  on  day 1, then bid on day 2, then TID on day 3.  Rx to pharmacy.  Pt will give update on Monday. Clobetasol rx not needed.  Pt will call if needs refill. CMP, Vit D obtained today Vit D  50K weekly.  #12/4RF  Levsin 0.125mg  every 4-6 hours prn.  #30/1RF Stop Robaxin and trial of flexeril 10mg  tid prn tiven.  #30/0RF Paxil 10mg  daily.  #90/4RF. AEX 1 year or follow up prn  ~10 minutes spent with patient spent in face to face discussion of shingles neuropathy treatment and shingles vaccination.

## 2016-08-04 LAB — COMPREHENSIVE METABOLIC PANEL
ALK PHOS: 137 IU/L — AB (ref 39–117)
ALT: 25 IU/L (ref 0–32)
AST: 25 IU/L (ref 0–40)
Albumin/Globulin Ratio: 1.4 (ref 1.2–2.2)
Albumin: 4 g/dL (ref 3.5–5.5)
BUN / CREAT RATIO: 15 (ref 9–23)
BUN: 15 mg/dL (ref 6–24)
Bilirubin Total: 0.3 mg/dL (ref 0.0–1.2)
CHLORIDE: 102 mmol/L (ref 96–106)
CO2: 23 mmol/L (ref 20–29)
Calcium: 9.7 mg/dL (ref 8.7–10.2)
Creatinine, Ser: 1 mg/dL (ref 0.57–1.00)
GFR calc Af Amer: 72 mL/min/{1.73_m2} (ref >59)
GFR calc non Af Amer: 63 mL/min/{1.73_m2} (ref >59)
GLUCOSE: 96 mg/dL (ref 65–99)
Globulin, Total: 2.8 g/dL (ref 1.5–4.5)
Potassium: 4.3 mmol/L (ref 3.5–5.2)
Sodium: 139 mmol/L (ref 134–144)
Total Protein: 6.8 g/dL (ref 6.0–8.5)

## 2016-08-04 LAB — VITAMIN D 25 HYDROXY (VIT D DEFICIENCY, FRACTURES): VIT D 25 HYDROXY: 35.9 ng/mL (ref 30.0–100.0)

## 2016-08-06 ENCOUNTER — Telehealth: Payer: Self-pay | Admitting: Obstetrics & Gynecology

## 2016-08-06 NOTE — Telephone Encounter (Signed)
1. Patient called to let Dr. Sabra Heck know the Gabapentin is too much at three times a day. "It's giving me a headache," she said. 2. Remembered she needs to add fluorouracil to her current medications.

## 2016-08-06 NOTE — Telephone Encounter (Signed)
I saw her on 08/03/16.  The instructions for using gabapentin were to start with 100mg  nightly x 7 nights, then 200mg  nightly x 7 nights, then 300mg  nightly (if needed).  She needs to stop taking them and make sure all symptoms resolve.  Then she should start with the 100mg  dosage and work up slowly.

## 2016-08-06 NOTE — Telephone Encounter (Signed)
Medication list updated.  Spoke with patient, advised to stop gabapentin, make sure symptoms resolve. Advised patient RN would f/u with additional instructions. Patient verbalizes understanding and is agreeable.   Dr. Sabra Heck, can you clarify the dosing for gabapentin? OV notes and RX state gabapentin 1/2 (300mg ) tab on day 1. 1/2 tab BID on day 2. 1/2 tab TID on day 3.

## 2016-08-06 NOTE — Telephone Encounter (Signed)
Spoke with patient. Patient states she is calling with update for Dr. Sabra Heck from starting gabapentin. Patient reports day 3 of taking 300 mg tid caused headache, feeling tired and sleepy. Patient denies aura or visula changes with headache. Patient states she has only taken twice today, headache not resolved. Advised patient would update Dr. Sabra Heck and return call with recommendations.  Patient states she needs to update medication list to include Fluorouracil BID to face for 3 weeks, started 07/31/16.   Dr. Sabra Heck -please advise on gabapentin?

## 2016-08-06 NOTE — Telephone Encounter (Signed)
When I originally did the order for her it looked like the 100mg  dosage wasn't covered for her so I did the 300mg .  So, she should take 1/2 tab nightly for 7 nights.  If she doesn't have any side effects she can increase to a whole tablet nightly.  Thank you for the reminder about the different dosage for her.  Thanks.

## 2016-08-07 NOTE — Telephone Encounter (Signed)
Left message to call Bianca Vester at [phone].  

## 2016-08-07 NOTE — Telephone Encounter (Signed)
Spoke with patient, advised of recommendations as seen below per Dr. Sabra Heck once symptoms have resolved. Reports HA still lingering, but not as bad.  Patient verbalizes understanding and is agreeable.   Routing to provider for final review. Patient is agreeable to disposition. Will close encounter.

## 2016-08-21 ENCOUNTER — Other Ambulatory Visit: Payer: Self-pay | Admitting: Obstetrics & Gynecology

## 2016-08-21 DIAGNOSIS — R748 Abnormal levels of other serum enzymes: Secondary | ICD-10-CM

## 2016-08-28 ENCOUNTER — Other Ambulatory Visit: Payer: Self-pay | Admitting: Gastroenterology

## 2016-08-28 DIAGNOSIS — R7989 Other specified abnormal findings of blood chemistry: Secondary | ICD-10-CM

## 2016-08-28 DIAGNOSIS — R945 Abnormal results of liver function studies: Principal | ICD-10-CM

## 2016-08-28 NOTE — Progress Notes (Signed)
Savannah Matte MD 

## 2016-09-05 ENCOUNTER — Ambulatory Visit
Admission: RE | Admit: 2016-09-05 | Discharge: 2016-09-05 | Disposition: A | Discharge status: Home / Self Care | Payer: BC Managed Care – PPO | Source: Ambulatory Visit | Attending: Gastroenterology | Admitting: Gastroenterology

## 2016-09-05 DIAGNOSIS — R945 Abnormal results of liver function studies: Principal | ICD-10-CM

## 2016-09-05 DIAGNOSIS — R7989 Other specified abnormal findings of blood chemistry: Secondary | ICD-10-CM

## 2016-09-07 ENCOUNTER — Telehealth: Payer: Self-pay | Admitting: Obstetrics & Gynecology

## 2016-09-07 NOTE — Telephone Encounter (Signed)
Spoke with patient. Reports was prescribed phentermine several years ago, requesting RX. Patient states she recently f/u with Dr. Collene Mares for elevated liver enzymes, was advised r/t fatty liver, weight loss recommended. Asking if medication will be an option.   Recommended OV for further evaluation and discussion with Dr. Sabra Heck, patient scheduled for 09/25/16 at 3pm. Patient verbalizes understanding and is agreeable.  Routing to provider for final review. Patient is agreeable to disposition. Will close encounter.

## 2016-09-07 NOTE — Telephone Encounter (Signed)
Patient called requesting a prescription for "a weight loss medication that Dr. Sabra Heck prescribed" for her before. She did not remember the name of the medication. Pharmacy on file confirmed, if needed.

## 2016-09-25 ENCOUNTER — Encounter: Payer: Self-pay | Admitting: Obstetrics & Gynecology

## 2016-09-25 ENCOUNTER — Ambulatory Visit (INDEPENDENT_AMBULATORY_CARE_PROVIDER_SITE_OTHER): Payer: BC Managed Care – PPO | Admitting: Obstetrics & Gynecology

## 2016-09-25 VITALS — BP 134/78 | HR 86 | Resp 18 | Ht 64.25 in | Wt 268.5 lb

## 2016-09-25 DIAGNOSIS — R931 Abnormal findings on diagnostic imaging of heart and coronary circulation: Secondary | ICD-10-CM | POA: Diagnosis not present

## 2016-09-25 DIAGNOSIS — Z6841 Body Mass Index (BMI) 40.0 and over, adult: Secondary | ICD-10-CM

## 2016-09-25 DIAGNOSIS — G43909 Migraine, unspecified, not intractable, without status migrainosus: Secondary | ICD-10-CM | POA: Diagnosis not present

## 2016-09-25 MED ORDER — CYCLOBENZAPRINE HCL 10 MG PO TABS: 10.0000 mg | ORAL_TABLET | Freq: Three times a day (TID) | ORAL | 1 refills | Status: DC | PRN

## 2016-09-25 NOTE — Progress Notes (Signed)
GYNECOLOGY  VISIT   HPI: 56 y.o. G46P0000 Married Caucasian female here for discussion of possible treatment with medication for weight loss.  BMI is 45 and weight is 268.  She is not interested in surgical weight loss.  Pt has started having increased issues with knee and hip pain.  She does have hx of chronic headaches/migraines as well as recent issues with decreased LV function and EF of 43%.    Reviewed medication options that I have used with pt's--phentermine, qsymia and contrave.  Feel all of these have unacceptable side effects given her medical history.  Feel she may benefit from bariatric referral as long as does not involve discussion of surgical weight loss.    GYNECOLOGIC HISTORY: Patient's last menstrual period was 10/14/2010. Contraception: hysterectomy Menopausal hormone therapy: none  Patient Active Problem List   Diagnosis Date Noted  . Shingles 08/03/2016  . Chronic pain of right knee 05/24/2016  . Unilateral primary osteoarthritis, right knee 05/24/2016  . Posterior tibial tendinitis, right leg 01/30/2016  . Lichen sclerosus 20/94/7096    Past Medical History:  Diagnosis Date  . Arthritis    bilateral knees, recent LCL sprain-on left knee  . Environmental allergies   . Headache(784.0)    migraines-on meds for control, relpax, ketoprophen, vicoden, muscle relaxer  . Hearing loss 2016   High frequency hearing - Bilateral   . Lichen sclerosus   . Lipoma    Right Knee  . Menorrhagia   . Pelvic pain in female   . PONV (postoperative nausea and vomiting)    ponv, slow to awake  . Seasonal allergies   . Sprain 10/25/2010   left knee  . Trigeminal pulse   . Trigger finger, right    Middle finger and right wrist     Past Surgical History:  Procedure Laterality Date  . APPENDECTOMY    . DIAGNOSTIC LAPAROSCOPY  08/2006  . DILATION AND CURETTAGE OF UTERUS  2011   attempted ablation  . FUNCTIONAL ENDOSCOPIC SINUS SURGERY    . ganglion cyst removal    .  HYSTEROSCOPY     failed  . LASIK    . LIPOMA EXCISION Right 07/22/2014   Procedure: EXCISION RIGHT THIGH LIPOMA;  Surgeon: Erroll Luna, MD;  Location: [facility];  Service: General;  Laterality: Right;  . ROBOTIC ASSISTED TOTAL HYSTERECTOMY  11/06/10   TLH/RSO  . SALPINGOOPHORECTOMY  11/06/2010   Procedure: SALPINGO OOPHERECTOMY;  Surgeon: Felipa Emory;  Location: [facility];  Service: Gynecology;  Laterality: Right;    MEDS:   Current Outpatient Prescriptions on File Prior to Visit  Medication Sig Dispense Refill  . CALCIUM PO Take 1 tablet by mouth daily.     . carvedilol (COREG) 3.125 MG tablet take 1 tablet by mouth twice a day 180 tablet 2  . clobetasol cream (TEMOVATE) 0.05 % Apply a pea sized amount topically BID for 1-2 weeks as needed, not for daily long term use. 60 g 0  . diclofenac sodium (VOLTAREN) 1 % GEL Apply 2 g topically 4 (four) times daily. 1 Tube 5  . eletriptan (RELPAX) 40 MG tablet Take 40 mg by mouth every 2 (two) hours as needed for migraine or headache.     . flurbiprofen (ANSAID) 100 MG tablet Take 100 mg by mouth as directed.     . gabapentin (NEURONTIN) 600 MG tablet 1/2 (300mg ) tab on day 1.  1/2 tab BID on day 2.  1/2 tab TID on day 3.  60 tablet 0  . glucosamine-chondroitin 500-400 MG tablet Take 1 tablet by mouth 3 (three) times daily.    [name] HYDROcodone-acetaminophen (NORCO/VICODIN) 5-325 MG tablet Take 1 tablet by mouth as directed.  0  . hyoscyamine (LEVSIN, ANASPAZ) 0.125 MG tablet Take 1 tablet (0.125 mg total) by mouth every 4 (four) hours as needed. 30 tablet 1  . lisinopril (PRINIVIL,ZESTRIL) 10 MG tablet Take 1 tablet (10 mg total) by mouth daily. 90 tablet 2  . methocarbamol (ROBAXIN) 750 MG tablet Take 750 mg by mouth as directed.     . multivitamin-lutein (OCUVITE-LUTEIN) CAPS capsule Take 1 capsule by mouth daily.    . Omega-3 Fatty Acids (FISH OIL PO) Take 1 tablet by mouth daily.     [name] PARoxetine (PAXIL) 10 MG tablet Take 1 tablet  (10 mg total) by mouth every morning. 90 tablet 4  . Vitamin D, Ergocalciferol, (DRISDOL) 50000 units CAPS capsule TAKE 1 CAPSULE EVERY 7 DAYS 12 capsule 4   No current facility-administered medications on file prior to visit.      ALLERGIES: Dust mite extract  Family History  Problem Relation Age of Onset  . Skin cancer Mother   . Heart disease Father   . Breast cancer Maternal Aunt 70  . Breast cancer Paternal Aunt 73    SH:  Married, non smoker  Review of Systems  Constitutional: Negative.   Respiratory: Negative.   Cardiovascular: Negative.   Psychiatric/Behavioral: Negative.     PHYSICAL EXAMINATION:    BP 134/78 (BP Location: Right Arm, Patient Position: Sitting, Cuff Size: Large)   Pulse 86   Resp 18   Ht 5' 4.25" (1.632 m)   Wt 268 lb 8 oz (121.8 kg)   LMP 10/14/2010   BMI 45.73 kg/m     General appearance: alert, cooperative and appears stated age No additional physical exam performed  Assessment: BMI 45, need for weight loss  Plan: Will consider referral to bariatrics for options that are not surgical in nature.

## 2016-10-01 DIAGNOSIS — R931 Abnormal findings on diagnostic imaging of heart and coronary circulation: Secondary | ICD-10-CM | POA: Insufficient documentation

## 2016-10-01 DIAGNOSIS — G43909 Migraine, unspecified, not intractable, without status migrainosus: Secondary | ICD-10-CM | POA: Insufficient documentation

## 2016-10-01 DIAGNOSIS — Z6841 Body Mass Index (BMI) 40.0 and over, adult: Secondary | ICD-10-CM | POA: Insufficient documentation

## 2016-10-08 ENCOUNTER — Other Ambulatory Visit: Payer: Self-pay | Admitting: Obstetrics and Gynecology

## 2016-10-08 NOTE — Telephone Encounter (Signed)
Patient needing #90 for mail in pharmacy -sent in remaining refills to [facility] -eh

## 2016-10-19 ENCOUNTER — Telehealth: Payer: Self-pay | Admitting: Obstetrics & Gynecology

## 2016-10-19 MED ORDER — PAROXETINE HCL 10 MG PO TABS: 10.0000 mg | ORAL_TABLET | Freq: Every morning | ORAL | 2 refills | Status: DC

## 2016-10-19 NOTE — Telephone Encounter (Signed)
[facility] calling to check the status of request for peroxetine tabs. Reference number for call back is [id number]. [phone]

## 2016-10-19 NOTE — Telephone Encounter (Signed)
Refill request was received on 10/08/2016. Rx was sent as Paroxetine 10 mg take 1 tablet every morning #90 2RF, but transmission to [facility] failed. Rx resent at this time and confirmed received by pharmacy.  Medication Detail    Disp Refills Start End   PARoxetine (PAXIL) 10 MG tablet 90 tablet 2 10/08/2016    Sig: TAKE 1 TABLET EVERY MORNING   Sent to pharmacy as: PARoxetine (PAXIL) 10 MG tablet   E-Prescribing Status: Transmission to pharmacy failed (10/08/2016 4:49 PM EDT)    Routing to provider for final review. Patient agreeable to disposition. Will close encounter.

## 2017-01-15 ENCOUNTER — Other Ambulatory Visit: Payer: Self-pay | Admitting: Obstetrics and Gynecology

## 2017-01-15 NOTE — Telephone Encounter (Signed)
Medication refill request: Clobetasol Cream  Last AEX:  08-03-16  Next AEX: 10-18-17  Last MMG (if hormonal medication request): 03-30-16 WNL  Refill authorized: please advise

## 2017-04-01 ENCOUNTER — Telehealth: Payer: Self-pay | Admitting: Obstetrics & Gynecology

## 2017-04-01 ENCOUNTER — Other Ambulatory Visit: Payer: Self-pay | Admitting: Obstetrics & Gynecology

## 2017-04-01 ENCOUNTER — Encounter: Payer: Self-pay | Admitting: Obstetrics & Gynecology

## 2017-04-01 DIAGNOSIS — Z1231 Encounter for screening mammogram for malignant neoplasm of breast: Secondary | ICD-10-CM

## 2017-04-01 NOTE — Telephone Encounter (Signed)
Dr.Miller, okay to place referral?

## 2017-04-01 NOTE — Telephone Encounter (Signed)
Patient sent the following correspondence through [facility]. Routing to triage to assist patient with request.  ----- Message from [place], Generic sent at 04/01/2017 8:54 AM EST -----    Dr. Sabra Heck,   I need a referral to a neurologist for my migraine treatment.  I was a patient of Dr. Michel Santee but he retired.  I was thinking about [facility] but they require a referral.  Can you refer me somewhere? It doesn't have to be at [facility]... just somewhere you think would be good for me?    Thanks,   Savannah [name]

## 2017-04-02 ENCOUNTER — Other Ambulatory Visit: Payer: Self-pay | Admitting: Obstetrics & Gynecology

## 2017-04-02 DIAGNOSIS — G43909 Migraine, unspecified, not intractable, without status migrainosus: Secondary | ICD-10-CM

## 2017-04-02 NOTE — Telephone Encounter (Signed)
Referral for [facility] placed for pt.

## 2017-04-02 NOTE — Progress Notes (Signed)
Referral placed to [facility] for referral for migraines.

## 2017-04-17 ENCOUNTER — Ambulatory Visit
Admission: RE | Admit: 2017-04-17 | Discharge: 2017-04-17 | Disposition: A | Discharge status: Home / Self Care | Payer: BC Managed Care – PPO | Source: Ambulatory Visit | Attending: Obstetrics & Gynecology | Admitting: Obstetrics & Gynecology

## 2017-04-17 DIAGNOSIS — Z1231 Encounter for screening mammogram for malignant neoplasm of breast: Secondary | ICD-10-CM

## 2017-05-05 ENCOUNTER — Other Ambulatory Visit: Payer: Self-pay | Admitting: Cardiovascular Disease

## 2017-06-04 ENCOUNTER — Other Ambulatory Visit: Payer: Self-pay

## 2017-06-04 ENCOUNTER — Ambulatory Visit: Payer: BC Managed Care – PPO | Admitting: Neurology

## 2017-06-04 ENCOUNTER — Encounter: Payer: Self-pay | Admitting: Neurology

## 2017-06-04 VITALS — BP 100/78 | HR 105 | Ht 65.0 in | Wt 268.0 lb

## 2017-06-04 DIAGNOSIS — G43909 Migraine, unspecified, not intractable, without status migrainosus: Secondary | ICD-10-CM | POA: Diagnosis not present

## 2017-06-04 MED ORDER — TOPIRAMATE 25 MG PO TABS: ORAL_TABLET | ORAL | 3 refills | Status: DC

## 2017-06-04 NOTE — Patient Instructions (Signed)
We will start Topamax for the headache.   Topamax (topiramate) is a seizure medication that has an FDA approval for seizures and for migraine headache. Potential side effects of this medication include weight loss, cognitive slowing, tingling in the fingers and toes, and carbonated drinks will taste bad. If any significant side effects are noted on this drug, please contact our office.  

## 2017-06-04 NOTE — Progress Notes (Signed)
Reason for visit: Migraine headache  Referring physician: Dr. Charise Killian [name] is a 57 y.o. female  History of present illness:  Savannah [name] is a 57 year old right-handed white female with a history of migraine headaches since age 55 or 56.  The patient has generally been fairly well controlled with her headaches having 2 or 3 headaches a month.  The patient was followed by Dr. Melton Alar prior to his retirement.  The patient indicates that over the last 2 months or so the headaches have significantly increased in frequency and severity.  The patient has gone to have about 16 headache days a month, 6-8 of these headaches are severe.  The patient indicates that most of her headaches are on the left side of the head, but they may be generalized in nature associated with some photophobia without much phonophobia.  She may have occasional nausea.  Flashing lights and increased stress may bring on headaches.  The patient has been taking Ansaid and Relpax for the headache.  The headaches may last up to 3 days of the time.  She is not on a daily medication for headache currently.  In the past, she was on beta-blockers for migraine which did help, but she currently is on Coreg.  The patient does report chronic neck stiffness.  The headaches themselves are associated with a pressure sensation.  She indicates that her mother and 1 brother also have migraine headaches.  The patient is sent to this office for an evaluation.  The patient drinks anywhere from 3-5 diet Cokes daily.  She does not drink other caffeinated drinks.  Past Medical History:  Diagnosis Date  . Arthritis    bilateral knees, recent LCL sprain-on left knee  . Environmental allergies   . Headache(784.0)    migraines-on meds for control, relpax, ketoprophen, vicoden, muscle relaxer  . Hearing loss 2016   High frequency hearing - Bilateral   . Lichen sclerosus   . Lipoma    Right Knee  . Menorrhagia   . Pelvic pain in female   .  PONV (postoperative nausea and vomiting)    ponv, slow to awake  . Seasonal allergies   . Sprain 10/25/2010   left knee  . Trigeminal pulse   . Trigger finger, right    Middle finger and right wrist     Past Surgical History:  Procedure Laterality Date  . APPENDECTOMY    . DIAGNOSTIC LAPAROSCOPY  08/2006  . DILATION AND CURETTAGE OF UTERUS  2011   attempted ablation  . FUNCTIONAL ENDOSCOPIC SINUS SURGERY    . ganglion cyst removal    . HYSTEROSCOPY     failed  . LASIK    . LIPOMA EXCISION Right 07/22/2014   Procedure: EXCISION RIGHT THIGH LIPOMA;  Surgeon: Erroll Luna, MD;  Location: [facility];  Service: General;  Laterality: Right;  . ROBOTIC ASSISTED TOTAL HYSTERECTOMY  11/06/10   TLH/RSO  . SALPINGOOPHORECTOMY  11/06/2010   Procedure: SALPINGO OOPHERECTOMY;  Surgeon: Felipa Emory;  Location: [facility];  Service: Gynecology;  Laterality: Right;    Family History  Problem Relation Age of Onset  . Skin cancer Mother   . Migraines Mother   . Heart disease Father   . Breast cancer Maternal Aunt 70  . Breast cancer Paternal Aunt 84  . Migraines Brother     Social history:  reports that she has never smoked. She has never used smokeless tobacco. She reports that she does  not drink alcohol or use drugs.  Medications:  Prior to Admission medications   Medication Sig Start Date End Date Taking? Authorizing Provider  CALCIUM PO Take 1 tablet by mouth daily.    Yes [provider]  carvedilol (COREG) 3.125 MG tablet Take 1 tablet (3.125 mg total) by mouth 2 (two) times daily. Please make overdue yearly appt with Dr. Johnsie Cancel. 1st attempt 05/07/17  Yes Josue Hector, MD  clobetasol cream (TEMOVATE) 0.05 % APPLY A PEA SIZED AMOUNT   TOPICALLY TWO TIMES A DAY  FOR 1-2 WEEKS -NOT FOR    DAILY LONG TERM USE 01/16/17  Yes Megan Salon, MD  cyclobenzaprine (FLEXERIL) 10 MG tablet Take 1 tablet (10 mg total) by mouth 3 (three) times daily as needed for muscle  spasms. 09/25/16  Yes Megan Salon, MD  diclofenac sodium (VOLTAREN) 1 % GEL Apply 2 g topically 4 (four) times daily. 05/24/16  Yes Leandrew Koyanagi, MD  eletriptan (RELPAX) 40 MG tablet Take 40 mg by mouth every 2 (two) hours as needed for migraine or headache.    Yes [provider]  flurbiprofen (ANSAID) 100 MG tablet Take 100 mg by mouth as directed.    Yes [provider]  gabapentin (NEURONTIN) 600 MG tablet 1/2 (300mg ) tab on day 1.  1/2 tab BID on day 2.  1/2 tab TID on day 3. 08/03/16  Yes Megan Salon, MD  glucosamine-chondroitin 500-400 MG tablet Take 1 tablet by mouth 3 (three) times daily.   Yes [provider]  HYDROcodone-acetaminophen (NORCO/VICODIN) 5-325 MG tablet Take 1 tablet by mouth as directed. 05/31/15  Yes [provider]  hyoscyamine (LEVSIN, ANASPAZ) 0.125 MG tablet Take 1 tablet (0.125 mg total) by mouth every 4 (four) hours as needed. 08/03/16  Yes Megan Salon, MD  lisinopril (PRINIVIL,ZESTRIL) 10 MG tablet Take 1 tablet (10 mg total) by mouth daily. 07/18/16  Yes Josue Hector, MD  methocarbamol (ROBAXIN) 750 MG tablet Take 750 mg by mouth as directed.    Yes [provider]  multivitamin-lutein (OCUVITE-LUTEIN) CAPS capsule Take 1 capsule by mouth daily.   Yes [provider]  Omega-3 Fatty Acids (FISH OIL PO) Take 1 tablet by mouth daily.    Yes [provider]  PARoxetine (PAXIL) 10 MG tablet Take 1 tablet (10 mg total) by mouth every morning. 10/19/16  Yes Megan Salon, MD  Vitamin D, Ergocalciferol, (DRISDOL) 50000 units CAPS capsule TAKE 1 CAPSULE EVERY 7 DAYS 04/25/16  Yes Megan Salon, MD  topiramate (TOPAMAX) 25 MG tablet Take one tablet at night for one week, then take 2 tablets at night for one week, then take 3 tablets at night. 06/04/17   Kathrynn Ducking, MD      Allergies  Allergen Reactions  . Dust Mite Extract Other (See Comments)    Environmental allergies    ROS:  Out of a  complete 14 system review of symptoms, the patient complains only of the following symptoms, and all other reviewed systems are negative.  Joint pain Allergies Headache  Blood pressure 100/78, pulse (!) 105, height 5\' 5"  (1.651 m), weight 268 lb (121.6 kg), last menstrual period 10/14/2010.  Physical Exam  General: The patient is alert and cooperative at the time of the examination.  The patient is markedly obese.  Eyes: Pupils are equal, round, and reactive to light. Discs are flat bilaterally.  Neck: The neck is supple, no carotid bruits are noted.  Respiratory: The respiratory examination is clear.  Cardiovascular: The cardiovascular examination reveals a regular rate and rhythm, no obvious murmurs or rubs are noted.   Neuromuscular: Range move the cervical spine is full.  Crepitus is seen in the temporomandibular joints bilaterally.  Skin: Extremities are without significant edema.  Neurologic Exam  Mental status: The patient is alert and oriented x 3 at the time of the examination. The patient has apparent normal recent and remote memory, with an apparently normal attention span and concentration ability.  Cranial nerves: Facial symmetry is present. There is good sensation of the face to pinprick and soft touch bilaterally. The strength of the facial muscles and the muscles to head turning and shoulder shrug are normal bilaterally. Speech is well enunciated, no aphasia or dysarthria is noted. Extraocular movements are full. Visual fields are full. The tongue is midline, and the patient has symmetric elevation of the soft palate. No obvious hearing deficits are noted.  Motor: The motor testing reveals 5 over 5 strength of all 4 extremities. Good symmetric motor tone is noted throughout.  Sensory: Sensory testing is intact to pinprick, soft touch, vibration sensation, and position sense on all 4 extremities. No evidence of extinction is noted.  Coordination: Cerebellar testing  reveals good finger-nose-finger and heel-to-shin bilaterally.  Gait and station: Gait is normal. Tandem gait is normal. Romberg is negative. No drift is seen.  Reflexes: Deep tendon reflexes are symmetric and normal bilaterally, with exception that the left ankle jerk reflex is depressed as compared to the right.  Toes are downgoing bilaterally.   Assessment/Plan:  1.  Migraine headache  The patient is having a significant increase in her headache frequency.  She is now having 16 headache days a month.  At times she may have visual aura with the headache.  The patient is taking Ansaid and Relpax for the headache.  Topamax will be added to the regimen.  She will follow-up in 3 months.  She will call for any dose adjustments of the medication.  She will need to cut back on the number of caffeinated drinks that she takes in daily.  Savannah Alexanders MD 06/04/2017 2:13 PM  [facility] [address] [place], [place] [address]  Phone [phone] Fax [phone]

## 2017-06-12 ENCOUNTER — Encounter: Payer: Self-pay | Admitting: Neurology

## 2017-06-13 ENCOUNTER — Other Ambulatory Visit: Payer: Self-pay | Admitting: Neurology

## 2017-06-13 MED ORDER — ELETRIPTAN HYDROBROMIDE 40 MG PO TABS: 40.0000 mg | ORAL_TABLET | Freq: Two times a day (BID) | ORAL | 5 refills | Status: DC | PRN

## 2017-06-23 ENCOUNTER — Other Ambulatory Visit: Payer: Self-pay | Admitting: Cardiovascular Disease

## 2017-06-24 ENCOUNTER — Encounter: Payer: Self-pay | Admitting: Neurology

## 2017-06-25 ENCOUNTER — Other Ambulatory Visit: Payer: Self-pay | Admitting: Neurology

## 2017-06-25 MED ORDER — VENLAFAXINE HCL ER 37.5 MG PO CP24: ORAL_CAPSULE | ORAL | 3 refills | Status: DC

## 2017-06-30 ENCOUNTER — Encounter: Payer: Self-pay | Admitting: Obstetrics & Gynecology

## 2017-06-30 MED ORDER — PAROXETINE HCL 10 MG PO TABS: 10.0000 mg | ORAL_TABLET | Freq: Every morning | ORAL | 1 refills | Status: DC

## 2017-08-30 NOTE — Progress Notes (Signed)
Patient ID: Savannah [name], female   DOB: 10-15-1960, 57 y.o.   MRN: [id number]     Cardiology Office Note   Date:  09/06/2017   ID:  Savannah [name], DOB Mar 30, 1960, MRN [id number]  PCP:  Lawerance Cruel, MD  Cardiologist:   Jenkins Rouge, MD   No chief complaint on file.     History of Present Illness:  57 y.o. history of PVC's. Asymptomatic. No history of CAD.  Uses Relpax for migraines. Had arthroscopuc knee surgery in 2694 with no complications Also had shingles along her ribs.   Myovue done 06/23/15   The left ventricular ejection fraction is moderately decreased (30-44%).  Nuclear stress EF: 43%.  There was no ST segment deviation noted during stress.  This is an intermediate risk study.  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 and is very stressed Husband sick with appendectomy and sepsis Mother passed last year   Past Medical History:  Diagnosis Date  . Arthritis    bilateral knees, recent LCL sprain-on left knee  . Environmental allergies   . Headache(784.0)    migraines-on meds for control, relpax, ketoprophen, vicoden, muscle relaxer  . Hearing loss 2016   High frequency hearing - Bilateral   . Lichen sclerosus   . Lipoma    Right Knee  . Menorrhagia   . Pelvic pain in female   . PONV (postoperative nausea and vomiting)    ponv, slow to awake  . Seasonal allergies   . Sprain 10/25/2010   left knee  . Trigeminal pulse   . Trigger finger, right    Middle finger and right wrist     Past Surgical History:  Procedure Laterality Date  . APPENDECTOMY    . DIAGNOSTIC LAPAROSCOPY   08/2006  . DILATION AND CURETTAGE OF UTERUS  2011   attempted ablation  . FUNCTIONAL ENDOSCOPIC SINUS SURGERY    . ganglion cyst removal    . HYSTEROSCOPY     failed  . LASIK    . LIPOMA EXCISION Right 07/22/2014   Procedure: EXCISION RIGHT THIGH LIPOMA;  Surgeon: Erroll Luna, MD;  Location: [facility];  Service: General;  Laterality: Right;  . ROBOTIC ASSISTED TOTAL HYSTERECTOMY  11/06/10   TLH/RSO  . SALPINGOOPHORECTOMY  11/06/2010   Procedure: SALPINGO OOPHERECTOMY;  Surgeon: Felipa Emory;  Location: [facility];  Service: Gynecology;  Laterality: Right;     Current Outpatient Medications  Medication Sig Dispense Refill  . acetaminophen (TYLENOL) 500 MG tablet Take 500 mg by mouth as directed.    [name] CALCIUM PO Take 1 tablet by mouth daily.     . carvedilol (COREG) 3.125 MG tablet Take 1 tablet (3.125 mg total) by mouth 2 (two) times daily. Patient needs to keep 08/2017 appointment for further refills 180 tablet 0  . clobetasol cream (TEMOVATE) 0.05 % APPLY A PEA SIZED AMOUNT   TOPICALLY TWO TIMES A DAY  FOR 1-2 WEEKS -NOT FOR    DAILY LONG TERM USE 60 g 0  . cyclobenzaprine (FLEXERIL) 10 MG tablet Take  1 tablet (10 mg total) by mouth 3 (three) times daily as needed for muscle spasms. 30 tablet 1  . eletriptan (RELPAX) 40 MG tablet Take 1 tablet (40 mg total) by mouth 2 (two) times daily as needed for migraine or headache. 10 tablet 5  . glucosamine-chondroitin 500-400 MG tablet Take 1 tablet by mouth 3 (three) times daily.    [name] HYDROcodone-acetaminophen (NORCO/VICODIN) 5-325 MG tablet Take 1 tablet by mouth as directed.  0  . hyoscyamine (LEVSIN, ANASPAZ) 0.125 MG tablet Take 1 tablet (0.125 mg total) by mouth every 4 (four) hours as needed. 30 tablet 1  . lisinopril (PRINIVIL,ZESTRIL) 10 MG tablet Take 1 tablet (10 mg total) by mouth daily. 90 tablet 2  . methocarbamol (ROBAXIN) 750 MG tablet Take 750 mg by mouth as directed.     . multivitamin-lutein (OCUVITE-LUTEIN) CAPS  capsule Take 1 capsule by mouth daily.    . Omega-3 Fatty Acids (FISH OIL PO) Take 1 tablet by mouth daily.     [name] PARoxetine (PAXIL) 10 MG tablet Take 1 tablet (10 mg total) by mouth every morning. 90 tablet 1  . venlafaxine XR (EFFEXOR XR) 37.5 MG 24 hr capsule 1 tablet daily for 2 weeks then take 2 tablets daily 60 capsule 3  . Vitamin D, Ergocalciferol, (DRISDOL) 50000 units CAPS capsule TAKE 1 CAPSULE EVERY 7 DAYS 12 capsule 4   No current facility-administered medications for this visit.     Allergies:   Dust mite extract    Social History:  The patient  reports that she has never smoked. She has never used smokeless tobacco. She reports that she does not drink alcohol or use drugs.   Family History:  The patient's family history includes Breast cancer (age of onset: 76) in her maternal aunt and paternal aunt; Heart disease in her father; Migraines in her brother and mother; Skin cancer in her mother.    ROS:  Please see the history of present illness.   Otherwise, review of systems are positive for none.   All other systems are reviewed and negative.    PHYSICAL EXAM: VS:  BP 112/78   Pulse (!) 58   Ht 5\' 5"  (1.651 m)   Wt 274 lb (124.3 kg)   LMP 10/14/2010   SpO2 96%   BMI 45.60 kg/m  , BMI Body mass index is 45.6 kg/m. Affect appropriate Overweight white female  HEENT: normal Neck supple with no adenopathy JVP normal no bruits no thyromegaly Lungs clear with no wheezing and good diaphragmatic motion Heart:  S1/S2 no murmur, no rub, gallop or click PMI normal Abdomen: benighn, BS positve, no tenderness, no AAA no bruit.  No HSM or HJR Distal pulses intact with no bruits Previous arthroscopic right knee surgery  Neuro non-focal Skin warm and dry No muscular weakness   EKG:  11/06/10  SR rate 88 bigeminy QT 402/486   06/07/15 NSR rate 103 LAE QT 338   09/06/17 SR rate 83 PVC LVH no acute changes   Recent Labs: No results found for requested labs within last 8760  hours.    Lipid Panel    Component Value Date/Time   CHOL 159 04/15/2015 1512   TRIG 179 (H) 04/15/2015 1512   HDL 39 (L) 04/15/2015 1512   CHOLHDL 4.1 04/15/2015 1512   VLDL 36 (H) 04/15/2015 1512   LDLCALC 84 04/15/2015 1512      Wt Readings from Last 3 Encounters:  09/06/17 274 lb (124.3 kg)  06/04/17  268 lb (121.6 kg)  09/25/16 268 lb 8 oz (121.8 kg)      Other studies Reviewed: Additional studies/ records that were reviewed today include: MRI, OR notes lipoma surgery and telemetry strips .    ASSESSMENT AND PLAN:  1.  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  DCM   Myovue suggests non ischemic DCM Continue beta blocker and ACE ? Cozaar caused insomnia Echo 05/01/16  EF stable 40-45% will update TTE Did discuss changing to Entresto if EF worse   2) Migraines relpax use probably ok with non ischemic myovue primarily a vasoconstrictor   3. Knee:  Good result with surgery f/u ortho   4. Shingles resolved consider vaccine in future    Signed, Jenkins Rouge, MD  09/06/2017 10:35 AM    [address], [place], [place]  [address] Phone: [phone]; Fax: [phone]

## 2017-09-06 ENCOUNTER — Ambulatory Visit: Payer: BC Managed Care – PPO | Admitting: Cardiovascular Disease

## 2017-09-06 ENCOUNTER — Encounter: Payer: Self-pay | Admitting: Cardiovascular Disease

## 2017-09-06 VITALS — BP 112/78 | HR 58 | Ht 65.0 in | Wt 274.0 lb

## 2017-09-06 DIAGNOSIS — I420 Dilated cardiomyopathy: Secondary | ICD-10-CM

## 2017-09-06 DIAGNOSIS — I5022 Chronic systolic (congestive) heart failure: Secondary | ICD-10-CM

## 2017-09-06 DIAGNOSIS — I493 Ventricular premature depolarization: Secondary | ICD-10-CM

## 2017-09-06 NOTE — Patient Instructions (Addendum)

## 2017-09-09 NOTE — Progress Notes (Signed)
[facility]  PATIENT: Savannah [name] DOB: Jun 29, 1960   REASON FOR VISIT: Follow-up for migraines HISTORY FROM: Patient    HISTORY OF PRESENT ILLNESS:UPDATE 7/30/2019CM Savannah [name] returns for follow-up with history of migraine headaches for years.  When last seen she was placed on Topamax but had side effects to the medication and then placed on Effexor which has been helpful but she never increased to the 75 mg dose.  She claims her  her migraines are due to stress.  She has not kept a record of her headaches and was encouraged to do so.  She takes Relpax acutely with relief .  She has not cut back on her caffeine.  She returns for reevaluation 06/04/17 KWMs. Savannah [name] with a history of migraine headaches since age 72 or 49.  The patient has generally been fairly well controlled with her headaches having 2 or 3 headaches a month.  The patient was followed by Dr. Melton Alar prior to his retirement.  The patient indicates that over the last 2 months or so the headaches have significantly increased in frequency and severity.  The patient has gone to have about 16 headache days a month, 6-8 of these headaches are severe.  The patient indicates that most of her headaches are on the left side of the head, but they may be generalized in nature associated with some photophobia without much phonophobia.  She may have occasional nausea.  Flashing lights and increased stress may bring on headaches.  The patient has been taking Ansaid and Relpax for the headache.  The headaches may last up to 3 days of the time.  She is not on a daily medication for headache currently.  In the past, she was on beta-blockers for migraine which did help, but she currently is on Coreg.  The patient does report chronic neck stiffness.  The headaches themselves are associated with a pressure sensation.  She indicates that her mother and 1 brother also have  migraine headaches.  The patient is sent to this office for an evaluation.  The patient drinks anywhere from 3-5 diet Cokes daily.  She does not drink other caffeinated drinks   REVIEW OF SYSTEMS: Full 14 system review of systems performed and notable only for those listed, all others are neg:  Constitutional: neg  Cardiovascular: neg Ear/Nose/Throat: neg  Skin: neg Eyes: neg Respiratory: neg Gastroitestinal: neg  Hematology/Lymphatic: neg  Endocrine: neg Musculoskeletal:neg Allergy/Immunology: neg Neurological: Headache  Psychiatric: neg Sleep : neg   ALLERGIES: Allergies  Allergen Reactions  . Dust Mite Extract Other (See Comments)    Environmental allergies    HOME MEDICATIONS: Outpatient Medications Prior to Visit  Medication Sig Dispense Refill  . acetaminophen (TYLENOL) 500 MG tablet Take 500 mg by mouth as directed.    [name] CALCIUM PO Take 1 tablet by mouth daily.     . carvedilol (COREG) 3.125 MG tablet Take 1 tablet (3.125 mg total) by mouth 2 (two) times daily. Patient needs to keep 08/2017 appointment for further refills 180 tablet 0  . clobetasol cream (TEMOVATE) 0.05 % APPLY A PEA SIZED AMOUNT   TOPICALLY TWO TIMES A DAY  FOR 1-2 WEEKS -NOT FOR    DAILY LONG TERM USE 60 g 0  . cyclobenzaprine (FLEXERIL) 10 MG tablet Take 1 tablet (10 mg total) by mouth 3 (three) times daily as needed for muscle spasms. 30 tablet 1  . eletriptan (RELPAX) 40 MG tablet  Take 1 tablet (40 mg total) by mouth 2 (two) times daily as needed for migraine or headache. 10 tablet 5  . glucosamine-chondroitin 500-400 MG tablet Take 1 tablet by mouth 3 (three) times daily.    [name] HYDROcodone-acetaminophen (NORCO/VICODIN) 5-325 MG tablet Take 1 tablet by mouth as directed.  0  . hyoscyamine (LEVSIN, ANASPAZ) 0.125 MG tablet Take 1 tablet (0.125 mg total) by mouth every 4 (four) hours as needed. 30 tablet 1  . lisinopril (PRINIVIL,ZESTRIL) 10 MG tablet Take 1 tablet (10 mg total) by mouth daily. 90  tablet 2  . multivitamin-lutein (OCUVITE-LUTEIN) CAPS capsule Take 1 capsule by mouth daily.    . Omega-3 Fatty Acids (FISH OIL PO) Take 1 tablet by mouth daily.     [name] PARoxetine (PAXIL) 10 MG tablet Take 1 tablet (10 mg total) by mouth every morning. 90 tablet 1  . venlafaxine XR (EFFEXOR XR) 37.5 MG 24 hr capsule 1 tablet daily for 2 weeks then take 2 tablets daily 60 capsule 3  . Vitamin D, Ergocalciferol, (DRISDOL) 50000 units CAPS capsule TAKE 1 CAPSULE EVERY 7 DAYS 12 capsule 4  . methocarbamol (ROBAXIN) 750 MG tablet Take 750 mg by mouth as directed.      No facility-administered medications prior to visit.     PAST MEDICAL HISTORY: Past Medical History:  Diagnosis Date  . Arthritis    bilateral knees, recent LCL sprain-on left knee  . Environmental allergies   . Headache(784.0)    migraines-on meds for control, relpax, ketoprophen, vicoden, muscle relaxer  . Hearing loss 2016   High frequency hearing - Bilateral   . Lichen sclerosus   . Lipoma    Right Knee  . Menorrhagia   . Pelvic pain in [name]   . PONV (postoperative nausea and vomiting)    ponv, slow to awake  . Seasonal allergies   . Sprain 10/25/2010   left knee  . Trigeminal pulse   . Trigger finger, right    Middle finger and right wrist     PAST SURGICAL HISTORY: Past Surgical History:  Procedure Laterality Date  . APPENDECTOMY    . DIAGNOSTIC LAPAROSCOPY  08/2006  . DILATION AND CURETTAGE OF UTERUS  2011   attempted ablation  . FUNCTIONAL ENDOSCOPIC SINUS SURGERY    . ganglion cyst removal    . HYSTEROSCOPY     failed  . LASIK    . LIPOMA EXCISION Right 07/22/2014   Procedure: EXCISION RIGHT THIGH LIPOMA;  Surgeon: Erroll Luna, MD;  Location: [place];  Service: General;  Laterality: Right;  . ROBOTIC ASSISTED TOTAL HYSTERECTOMY  11/06/10   TLH/RSO  . SALPINGOOPHORECTOMY  11/06/2010   Procedure: SALPINGO OOPHERECTOMY;  Surgeon: Felipa Emory;  Location: [facility];  Service:  Gynecology;  Laterality: Right;    FAMILY HISTORY: Family History  Problem Relation Age of Onset  . Skin cancer Mother   . Migraines Mother   . Heart disease Father   . Breast cancer Maternal Aunt 70  . Breast cancer Paternal Aunt 41  . Migraines Brother     SOCIAL HISTORY: Social History   Socioeconomic History  . Marital status: Married    Spouse name: Not on file  . Number of children: 0  . Years of education: 68  . Highest education level: Not on file  Occupational History  . Occupation: [employer]  Social Needs  . Financial resource strain: Not on file  . Food insecurity:    Worry: Not  on file    Inability: Not on file  . Transportation needs:    Medical: Not on file    Non-medical: Not on file  Tobacco Use  . Smoking status: Never Smoker  . Smokeless tobacco: Never Used  Substance and Sexual Activity  . Alcohol use: No    Alcohol/week: 0.0 oz  . Drug use: No  . Sexual activity: Yes    Birth control/protection: Surgical    Comment: TLH/RSO  Lifestyle  . Physical activity:    Days per week: Not on file    Minutes per session: Not on file  . Stress: Not on file  Relationships  . Social connections:    Talks on phone: Not on file    Gets together: Not on file    Attends religious service: Not on file    Active member of club or organization: Not on file    Attends meetings of clubs or organizations: Not on file    Relationship status: Not on file  . Intimate partner violence:    Fear of current or ex partner: Not on file    Emotionally abused: Not on file    Physically abused: Not on file    Forced sexual activity: Not on file  Other Topics Concern  . Not on file  Social History Narrative   Lives w/ husband, Eddie Dibbles   Caffeine use: Diet coke girl   Right handed      PHYSICAL EXAM  Vitals:   09/10/17 1400  BP: 134/81  Pulse: (!) 44  Weight: 273 lb 12.8 oz (124.2 kg)  Height: 5\' 5"  (1.651 m)   Body mass index is 45.56 kg/m.  Generalized: Well  developed, obese [name] in no acute distress  Head: normocephalic and atraumatic,. Oropharynx benign  Neck: Supple,  Musculoskeletal: No deformity   Neurological examination   Mentation: Alert oriented to time, place, history taking. Attention span and concentration appropriate. Recent and remote memory intact.  Follows all commands speech and language fluent.   Cranial nerve II-XII: Pupils were equal round reactive to light extraocular movements were full, visual field were full on confrontational test. Facial sensation and strength were normal. hearing was intact to finger rubbing bilaterally. Uvula tongue midline. head turning and shoulder shrug were normal and symmetric.Tongue protrusion into cheek strength was normal. Motor: normal bulk and tone, full strength in the BUE, BLE, fine finger movements normal, no pronator drift. No focal weakness Sensory: normal and symmetric to light touch,  Coordination: finger-nose-finger, heel-to-shin bilaterally, no dysmetria Reflexes: Symmetric upper and lower plantar responses were flexor bilaterally. Gait and Station: Rising up from seated position without assistance, normal stance,  moderate stride, good arm swing, smooth turning, able to perform tiptoe, and heel walking without difficulty. Tandem gait is steady  DIAGNOSTIC DATA (LABS, IMAGING, TESTING) - I reviewed patient records, labs, notes, testing and imaging myself where available.  Lab Results  Component Value Date   WBC 9.6 04/15/2015   HGB 13.6 04/15/2015   HCT 41.8 04/15/2015   MCV 89.7 04/15/2015   PLT 319 04/15/2015      Component Value Date/Time   NA 139 08/03/2016 1509   K 4.3 08/03/2016 1509   CL 102 08/03/2016 1509   CO2 23 08/03/2016 1509   GLUCOSE 96 08/03/2016 1509   GLUCOSE 89 04/15/2015 1512   BUN 15 08/03/2016 1509   CREATININE 1.00 08/03/2016 1509   CREATININE 0.85 04/15/2015 1512   CALCIUM 9.7 08/03/2016 1509   PROT 6.8 08/03/2016  1509   ALBUMIN 4.0  08/03/2016 1509   AST 25 08/03/2016 1509   ALT 25 08/03/2016 1509   ALKPHOS 137 (H) 08/03/2016 1509   BILITOT 0.3 08/03/2016 1509   GFRNONAA 63 08/03/2016 1509   GFRAA 72 08/03/2016 1509   Lab Results  Component Value Date   CHOL 159 04/15/2015   HDL 39 (L) 04/15/2015   LDLCALC 84 04/15/2015   TRIG 179 (H) 04/15/2015   CHOLHDL 4.1 04/15/2015    Lab Results  Component Value Date   TSH 3.41 04/15/2015      ASSESSMENT AND PLAN  57 y.o. year old [name] here to follow-up for migraine headaches.  She was placed on Topamax when last seen by Dr. Jannifer Franklin and then switched to Effexor due to side effects.  Her headaches had improved on Effexor however she has not increased to 75 mg dose     PLAN: Increase Effexor 75 mg daily Continue Relpax acutely Migraine tracker app to record headaches Given information on migraine triggers Given information on stress release techniques Cut back on caffeinated products Follow-up in 3 months Dennie Bible, [facility], [facility], [facility] [address], [place], [place] [address] [phone]

## 2017-09-10 ENCOUNTER — Encounter: Payer: Self-pay | Admitting: Cardiovascular Disease

## 2017-09-10 ENCOUNTER — Encounter: Payer: Self-pay | Admitting: Nurse Practitioner

## 2017-09-10 ENCOUNTER — Ambulatory Visit (HOSPITAL_COMMUNITY): Payer: BC Managed Care – PPO | Attending: Cardiology

## 2017-09-10 ENCOUNTER — Ambulatory Visit: Payer: BC Managed Care – PPO | Admitting: Nurse Practitioner

## 2017-09-10 ENCOUNTER — Other Ambulatory Visit: Payer: Self-pay

## 2017-09-10 VITALS — BP 134/81 | HR 44 | Ht 65.0 in | Wt 273.8 lb

## 2017-09-10 DIAGNOSIS — G43909 Migraine, unspecified, not intractable, without status migrainosus: Secondary | ICD-10-CM

## 2017-09-10 DIAGNOSIS — I420 Dilated cardiomyopathy: Secondary | ICD-10-CM | POA: Diagnosis not present

## 2017-09-10 MED ORDER — VENLAFAXINE HCL ER 75 MG PO CP24: 75.0000 mg | ORAL_CAPSULE | Freq: Every day | ORAL | 1 refills | Status: DC

## 2017-09-10 NOTE — Patient Instructions (Signed)
Increase Effexor 75 mg daily Continue Relpax acutely Migraine tracker app to record headaches Given information on migraine triggers Given information on stress release techniques Cut back on caffeinated products Follow-up in 3 months

## 2017-09-11 ENCOUNTER — Encounter: Payer: Self-pay | Admitting: Cardiovascular Disease

## 2017-09-11 ENCOUNTER — Other Ambulatory Visit: Payer: Self-pay | Admitting: Cardiovascular Disease

## 2017-09-11 MED ORDER — LISINOPRIL 10 MG PO TABS: 10.0000 mg | ORAL_TABLET | Freq: Every day | ORAL | 3 refills | Status: DC

## 2017-09-11 MED ORDER — CARVEDILOL 3.125 MG PO TABS: 3.1250 mg | ORAL_TABLET | Freq: Two times a day (BID) | ORAL | 3 refills | Status: DC

## 2017-09-11 NOTE — Telephone Encounter (Signed)
Pt's medications were sent to pt's pharmacy as requested. Confirmation received.  

## 2017-09-19 ENCOUNTER — Other Ambulatory Visit: Payer: Self-pay | Admitting: Neurology

## 2017-09-19 ENCOUNTER — Other Ambulatory Visit: Payer: Self-pay | Admitting: Cardiovascular Disease

## 2017-09-19 NOTE — Telephone Encounter (Signed)
Order Providers   Prescribing Provider Encounter Provider  Josue Hector, MD Josue Hector, MD  Outpatient Medication Detail    Disp Refills Start End   carvedilol (COREG) 3.125 MG tablet 180 tablet 3 09/11/2017    Sig - Route: Take 1 tablet (3.125 mg total) by mouth 2 (two) times daily. - Oral   Sent to pharmacy as: carvedilol (COREG) 3.125 MG tablet   Notes to Pharmacy: **Patient requests 90 days supply**   E-Prescribing Status: Receipt confirmed by pharmacy (09/11/2017 1:37 PM EDT)   Pharmacy   [facility] [place], [place]

## 2017-10-18 ENCOUNTER — Encounter: Payer: Self-pay | Admitting: Obstetrics & Gynecology

## 2017-10-18 ENCOUNTER — Ambulatory Visit: Payer: BC Managed Care – PPO | Admitting: Obstetrics & Gynecology

## 2017-10-18 VITALS — BP 118/66 | HR 72 | Resp 16 | Ht 64.5 in | Wt 275.0 lb

## 2017-10-18 DIAGNOSIS — Z01419 Encounter for gynecological examination (general) (routine) without abnormal findings: Secondary | ICD-10-CM | POA: Diagnosis not present

## 2017-10-18 DIAGNOSIS — Z0000 Encounter for general adult medical examination without abnormal findings: Secondary | ICD-10-CM | POA: Diagnosis not present

## 2017-10-18 MED ORDER — CYCLOBENZAPRINE HCL 10 MG PO TABS: 10.0000 mg | ORAL_TABLET | Freq: Three times a day (TID) | ORAL | 1 refills | Status: DC | PRN

## 2017-10-18 MED ORDER — FLUCONAZOLE 150 MG PO TABS: 150.0000 mg | ORAL_TABLET | Freq: Once | ORAL | 0 refills | Status: AC

## 2017-10-18 MED ORDER — PAROXETINE HCL 10 MG PO TABS: 10.0000 mg | ORAL_TABLET | Freq: Every morning | ORAL | 1 refills | Status: DC

## 2017-10-18 NOTE — Progress Notes (Signed)
57 y.o. G0P0000 MarriedCaucasianF here for annual exam.  Has been on antibiotics for two weeks (amoxicillin) due to sinusitis.  Reports she is better just not well, yet.  Waiting to see if she ends up with a yeast infections.  Denies vaginal bleeding.  Off and on for about 3 months, she will have a sharp stabbing pain that goes into the center of her breast.  Pain lasts about 20 minutes and then is gone.  This sharp, shooting pain is maybe every week or so.  Hasn't been able to figure out any causes--occurs with sitting or driving or activity.  Has not woken her from her sleep.  Does drink caffeine but this hasn't changed.  Drinks 4-5, 16oz caffeinated beverages a day.  PCP:  Dr. Harrington Challenger. Patient's last menstrual period was 10/14/2010.          Sexually active: No.  The current method of family planning is status post hysterectomy.    Exercising: No.  none Smoker:  no  Health Maintenance: Pap:  09/01/10 Neg  History of abnormal Pap:  no MMG:  04/17/17 BIRADS1:Neg  Colonoscopy:  2011 normal. F/u 10 years  BMD:   never TDaP:  2018 Pneumonia vaccine(s):  none Shingrix:   Had shingles in the past.  Declines having the vaccine.   Hep C testing: 04/15/15 Neg  Screening Labs: , Hb today: obtained today   reports that she has never smoked. She has never used smokeless tobacco. She reports that she does not drink alcohol or use drugs.  Past Medical History:  Diagnosis Date  . Arthritis    bilateral knees, recent LCL sprain-on left knee  . Environmental allergies   . Headache(784.0)    migraines-on meds for control, relpax, ketoprophen, vicoden, muscle relaxer  . Hearing loss 2016   High frequency hearing - Bilateral   . Lichen sclerosus   . Lipoma    Right Knee  . Menorrhagia   . Pelvic pain in female   . PONV (postoperative nausea and vomiting)    ponv, slow to awake  . Seasonal allergies   . Sprain 10/25/2010   left knee  . Trigeminal pulse   . Trigger finger, right    Middle finger  and right wrist     Past Surgical History:  Procedure Laterality Date  . APPENDECTOMY    . DIAGNOSTIC LAPAROSCOPY  08/2006  . DILATION AND CURETTAGE OF UTERUS  2011   attempted ablation  . FUNCTIONAL ENDOSCOPIC SINUS SURGERY    . ganglion cyst removal    . HYSTEROSCOPY     failed  . LASIK    . LIPOMA EXCISION Right 07/22/2014   Procedure: EXCISION RIGHT THIGH LIPOMA;  Surgeon: Erroll Luna, MD;  Location: [place];  Service: General;  Laterality: Right;  . ROBOTIC ASSISTED TOTAL HYSTERECTOMY  11/06/10   TLH/RSO  . SALPINGOOPHORECTOMY  11/06/2010   Procedure: SALPINGO OOPHERECTOMY;  Surgeon: Felipa Emory;  Location: [facility];  Service: Gynecology;  Laterality: Right;    Current Outpatient Medications  Medication Sig Dispense Refill  . acetaminophen (TYLENOL) 500 MG tablet Take 500 mg by mouth as directed.    [name] amoxicillin (AMOXIL) 875 MG tablet TK 1 T PO Q 12 H FOR 5 DAYS  0  . CALCIUM PO Take 1 tablet by mouth daily.     . carvedilol (COREG) 3.125 MG tablet Take 1 tablet (3.125 mg total) by mouth 2 (two) times daily. 180 tablet 3  . clobetasol cream (  TEMOVATE) 0.05 % APPLY A PEA SIZED AMOUNT   TOPICALLY TWO TIMES A DAY  FOR 1-2 WEEKS -NOT FOR    DAILY LONG TERM USE 60 g 0  . cyclobenzaprine (FLEXERIL) 10 MG tablet Take 1 tablet (10 mg total) by mouth 3 (three) times daily as needed for muscle spasms. 30 tablet 1  . eletriptan (RELPAX) 40 MG tablet Take 1 tablet (40 mg total) by mouth 2 (two) times daily as needed for migraine or headache. 10 tablet 5  . glucosamine-chondroitin 500-400 MG tablet Take 1 tablet by mouth 3 (three) times daily.    [name] HYDROcodone-acetaminophen (NORCO/VICODIN) 5-325 MG tablet Take 1 tablet by mouth as directed.  0  . hyoscyamine (LEVSIN, ANASPAZ) 0.125 MG tablet Take 1 tablet (0.125 mg total) by mouth every 4 (four) hours as needed. 30 tablet 1  . lisinopril (PRINIVIL,ZESTRIL) 10 MG tablet Take 1 tablet (10 mg total) by mouth daily. 90  tablet 3  . methocarbamol (ROBAXIN) 750 MG tablet Take 750 mg by mouth as directed.     . multivitamin-lutein (OCUVITE-LUTEIN) CAPS capsule Take 1 capsule by mouth daily.    . Omega-3 Fatty Acids (FISH OIL PO) Take 1 tablet by mouth daily.     [name] PARoxetine (PAXIL) 10 MG tablet Take 1 tablet (10 mg total) by mouth every morning. 90 tablet 1  . venlafaxine XR (EFFEXOR-XR) 75 MG 24 hr capsule Take 1 capsule (75 mg total) by mouth daily with breakfast. 90 capsule 1   No current facility-administered medications for this visit.     Family History  Problem Relation Age of Onset  . Skin cancer Mother   . Migraines Mother   . Heart disease Father   . Breast cancer Maternal Aunt 70  . Breast cancer Paternal Aunt 27  . Migraines Brother     Review of Systems  HENT: Positive for hearing loss.   Genitourinary:       Loss of urine when sneezing   All other systems reviewed and are negative.   Exam:   Vitals:   10/18/17 1349  BP: 118/66  Pulse: 72  Resp: 16    General appearance: alert, cooperative and appears stated age Head: Normocephalic, without obvious abnormality, atraumatic Neck: no adenopathy, supple, symmetrical, trachea midline and thyroid normal to inspection and palpation Lungs: clear to auscultation bilaterally Breasts: normal appearance, no masses or tenderness Heart: regular rate and rhythm Abdomen: soft, non-tender; bowel sounds normal; no masses,  no organomegaly Extremities: extremities normal, atraumatic, no cyanosis or edema Skin: Skin color, texture, turgor normal. No rashes or lesions Lymph nodes: Cervical, supraclavicular, and axillary nodes normal. No abnormal inguinal nodes palpated Neurologic: Grossly normal   Pelvic: External genitalia:  no lesions              Urethra:  normal appearing urethra with no masses, tenderness or lesions              Bartholins and Skenes: normal                 Vagina: normal appearing vagina with normal color and  discharge, no lesions              Cervix: absent              Pap taken: No. Bimanual Exam:  Uterus:  uterus absent              Adnexa: no mass, fullness, tenderness  Rectovaginal: Confirms               Anus:  normal sphincter tone, no lesions  Chaperone was present for exam.  A:  Well Woman with normal exam H/O robotic TLH/RSO H/o chronic systolic congestive heart failure--followed by Dr. Johnsie Cancel H/o LS&A H/O trigeminy noted in PACU after lipoma removal 6/16 Obesity Singles 2017.  Declines Shingrix vaccination Right breast pain, normal exam.  Offered diagnostic imaging.  Pt desires to wait. Recent antibiotic use, risks for yeast vaginitis  P:   Mammogram guidelines reviewed.  Doing 3D. pap smear not indicated Rx for Paxil 10mg  daily.  #90/4RF. Flexeril 10mg  tid prn.  #30/1RF TSH, Vit D, CBC, CMP, Lipids Return annually or prn

## 2017-10-19 LAB — COMPREHENSIVE METABOLIC PANEL
A/G RATIO: 1.4 (ref 1.2–2.2)
ALT: 30 IU/L (ref 0–32)
AST: 29 IU/L (ref 0–40)
Albumin: 4.2 g/dL (ref 3.5–5.5)
Alkaline Phosphatase: 147 IU/L — ABNORMAL HIGH (ref 39–117)
BUN/Creatinine Ratio: 14 (ref 9–23)
BUN: 14 mg/dL (ref 6–24)
Bilirubin Total: 0.3 mg/dL (ref 0.0–1.2)
CHLORIDE: 101 mmol/L (ref 96–106)
CO2: 20 mmol/L (ref 20–29)
Calcium: 10.5 mg/dL — ABNORMAL HIGH (ref 8.7–10.2)
Creatinine, Ser: 0.98 mg/dL (ref 0.57–1.00)
GFR calc non Af Amer: 64 mL/min/{1.73_m2} (ref >59)
GFR, EST AFRICAN AMERICAN: 74 mL/min/{1.73_m2} (ref >59)
Globulin, Total: 2.9 g/dL (ref 1.5–4.5)
Glucose: 93 mg/dL (ref 65–99)
Potassium: 4.5 mmol/L (ref 3.5–5.2)
SODIUM: 137 mmol/L (ref 134–144)
TOTAL PROTEIN: 7.1 g/dL (ref 6.0–8.5)

## 2017-10-19 LAB — CBC

## 2017-10-19 LAB — LIPID PANEL
Chol/HDL Ratio: 4.2 ratio (ref 0.0–4.4)
Cholesterol, Total: 190 mg/dL (ref 100–199)
HDL: 45 mg/dL (ref >39)
LDL Calculated: 115 mg/dL — ABNORMAL HIGH (ref 0–99)
Triglycerides: 148 mg/dL (ref 0–149)
VLDL Cholesterol Cal: 30 mg/dL (ref 5–40)

## 2017-10-19 LAB — VITAMIN D 25 HYDROXY (VIT D DEFICIENCY, FRACTURES): Vit D, 25-Hydroxy: 32.2 ng/mL (ref 30.0–100.0)

## 2017-10-19 LAB — TSH: TSH: 3.67 u[IU]/mL (ref 0.450–4.500)

## 2017-12-16 NOTE — Progress Notes (Signed)
[facility]  PATIENT: Savannah [name] DOB: 11-06-1960   REASON FOR VISIT: Follow-up for migraines HISTORY FROM: Patient    HISTORY OF PRESENT ILLNESS:UPDATE 11/5/2019CM Savannah [name], 57 year old female returns for follow-up with a history of migraines for many years.  She has failed Topamax in the past.  When last seen her Effexor was increased to 75 mg daily.  This is been beneficial for her since her migraines are due to stress.  Relpax works acutely.  She returns for reevaluation    UPDATE 7/30/2019CM Savannah [name], 57 year old female returns for follow-up with history of migraine headaches for years.  When last seen she was placed on Topamax but had side effects to the medication and then placed on Effexor which has been helpful but she never increased to the 75 mg dose.  She claims her  her migraines are due to stress.  She has not kept a record of her headaches and was encouraged to do so.  She takes Relpax acutely with relief .  She has not cut back on her caffeine.  She returns for reevaluation 06/04/17 Savannah [name] is a 57 year old right-handed white female with a history of migraine headaches since age 22 or 35.  The patient has generally been fairly well controlled with her headaches having 2 or 3 headaches a month.  The patient was followed by Dr. Melton Alar prior to his retirement.  The patient indicates that over the last 2 months or so the headaches have significantly increased in frequency and severity.  The patient has gone to have about 16 headache days a month, 6-8 of these headaches are severe.  The patient indicates that most of her headaches are on the left side of the head, but they may be generalized in nature associated with some photophobia without much phonophobia.  She may have occasional nausea.  Flashing lights and increased stress may bring on headaches.  The patient has been taking Ansaid and Relpax for the headache.  The headaches may last up to 3 days of the  time.  She is not on a daily medication for headache currently.  In the past, she was on beta-blockers for migraine which did help, but she currently is on Coreg.  The patient does report chronic neck stiffness.  The headaches themselves are associated with a pressure sensation.  She indicates that her mother and 1 brother also have migraine headaches.  The patient is sent to this office for an evaluation.  The patient drinks anywhere from 3-5 diet Cokes daily.  She does not drink other caffeinated drinks   REVIEW OF SYSTEMS: Full 14 system review of systems performed and notable only for those listed, all others are neg:  Constitutional: neg  Cardiovascular: neg Ear/Nose/Throat: Hearing loss Skin: neg Eyes: neg Respiratory: neg Gastroitestinal: neg  Hematology/Lymphatic: Easy bruising Endocrine: neg Musculoskeletal:neg Allergy/Immunology: neg Neurological: Headache  Psychiatric: neg Sleep : neg   ALLERGIES: Allergies  Allergen Reactions  . Dust Mite Extract Other (See Comments)    Environmental allergies    HOME MEDICATIONS: Outpatient Medications Prior to Visit  Medication Sig Dispense Refill  . acetaminophen (TYLENOL) 500 MG tablet Take 500 mg by mouth as directed.    [name] CALCIUM PO Take 1 tablet by mouth daily.     . carvedilol (COREG) 3.125 MG tablet Take 1 tablet (3.125 mg total) by mouth 2 (two) times daily. 180 tablet 3  . clobetasol cream (TEMOVATE) 0.05 % APPLY A PEA SIZED AMOUNT   TOPICALLY TWO  TIMES A DAY  FOR 1-2 WEEKS -NOT FOR    DAILY LONG TERM USE 60 g 0  . cyclobenzaprine (FLEXERIL) 10 MG tablet Take 1 tablet (10 mg total) by mouth 3 (three) times daily as needed for muscle spasms. 30 tablet 1  . eletriptan (RELPAX) 40 MG tablet Take 1 tablet (40 mg total) by mouth 2 (two) times daily as needed for migraine or headache. 10 tablet 5  . glucosamine-chondroitin 500-400 MG tablet Take 1 tablet by mouth 3 (three) times daily.    [name] HYDROcodone-acetaminophen  (NORCO/VICODIN) 5-325 MG tablet Take 1 tablet by mouth as directed.  0  . hyoscyamine (LEVSIN, ANASPAZ) 0.125 MG tablet Take 1 tablet (0.125 mg total) by mouth every 4 (four) hours as needed. 30 tablet 1  . lisinopril (PRINIVIL,ZESTRIL) 10 MG tablet Take 1 tablet (10 mg total) by mouth daily. 90 tablet 3  . multivitamin-lutein (OCUVITE-LUTEIN) CAPS capsule Take 1 capsule by mouth daily.    . Omega-3 Fatty Acids (FISH OIL PO) Take 1 tablet by mouth daily.     [name] PARoxetine (PAXIL) 10 MG tablet Take 1 tablet (10 mg total) by mouth every morning. 90 tablet 1  . venlafaxine XR (EFFEXOR-XR) 75 MG 24 hr capsule Take 1 capsule (75 mg total) by mouth daily with breakfast. 90 capsule 1  . methocarbamol (ROBAXIN) 750 MG tablet Take 750 mg by mouth as directed.     [name] amoxicillin (AMOXIL) 875 MG tablet TK 1 T PO Q 12 H FOR 5 DAYS  0   No facility-administered medications prior to visit.     PAST MEDICAL HISTORY: Past Medical History:  Diagnosis Date  . Arthritis    bilateral knees, recent LCL sprain-on left knee  . Environmental allergies   . Headache(784.0)    migraines-on meds for control, relpax, ketoprophen, vicoden, muscle relaxer  . Hearing loss 2016   High frequency hearing - Bilateral   . Lichen sclerosus   . Lipoma    Right Knee  . Menorrhagia   . Pelvic pain in female   . PONV (postoperative nausea and vomiting)    ponv, slow to awake  . Seasonal allergies   . Sprain 10/25/2010   left knee  . Trigeminal pulse   . Trigger finger, right    Middle finger and right wrist     PAST SURGICAL HISTORY: Past Surgical History:  Procedure Laterality Date  . APPENDECTOMY    . DIAGNOSTIC LAPAROSCOPY  08/2006  . DILATION AND CURETTAGE OF UTERUS  2011   attempted ablation  . FUNCTIONAL ENDOSCOPIC SINUS SURGERY    . ganglion cyst removal    . HYSTEROSCOPY     failed  . LASIK    . LIPOMA EXCISION Right 07/22/2014   Procedure: EXCISION RIGHT THIGH LIPOMA;  Surgeon: Erroll Luna, MD;   Location: [facility];  Service: General;  Laterality: Right;  . ROBOTIC ASSISTED TOTAL HYSTERECTOMY  11/06/10   TLH/RSO  . SALPINGOOPHORECTOMY  11/06/2010   Procedure: SALPINGO OOPHERECTOMY;  Surgeon: Felipa Emory;  Location: [facility];  Service: Gynecology;  Laterality: Right;    FAMILY HISTORY: Family History  Problem Relation Age of Onset  . Skin cancer Mother   . Migraines Mother   . Heart disease Father   . Breast cancer Maternal Aunt 70  . Breast cancer Paternal Aunt 101  . Migraines Brother     SOCIAL HISTORY: Social History   Socioeconomic History  . Marital status: Married  Spouse name: Not on file  . Number of children: 0  . Years of education: 55  . Highest education level: Not on file  Occupational History  . Occupation: [employer]  Social Needs  . Financial resource strain: Not on file  . Food insecurity:    Worry: Not on file    Inability: Not on file  . Transportation needs:    Medical: Not on file    Non-medical: Not on file  Tobacco Use  . Smoking status: Never Smoker  . Smokeless tobacco: Never Used  Substance and Sexual Activity  . Alcohol use: No    Alcohol/week: 0.0 standard drinks  . Drug use: No  . Sexual activity: Yes    Birth control/protection: Surgical    Comment: TLH/RSO  Lifestyle  . Physical activity:    Days per week: Not on file    Minutes per session: Not on file  . Stress: Not on file  Relationships  . Social connections:    Talks on phone: Not on file    Gets together: Not on file    Attends religious service: Not on file    Active member of club or organization: Not on file    Attends meetings of clubs or organizations: Not on file    Relationship status: Not on file  . Intimate partner violence:    Fear of current or ex partner: Not on file    Emotionally abused: Not on file    Physically abused: Not on file    Forced sexual activity: Not on file  Other Topics Concern  . Not on file  Social History  Narrative   Lives w/ husband, Eddie Dibbles   Caffeine use: Diet coke girl   Right handed      PHYSICAL EXAM  Vitals:   12/17/17 1457  BP: (!) 145/86  Pulse: (!) 48  Height: 5' 4.5" (1.638 m)   Body mass index is 46.47 kg/m.  Generalized: Well developed, obese female in no acute distress  Head: normocephalic and atraumatic,. Oropharynx benign  Neck: Supple,  Musculoskeletal: No deformity   Neurological examination   Mentation: Alert oriented to time, place, history taking. Attention span and concentration appropriate. Recent and remote memory intact.  Follows all commands speech and language fluent.   Cranial nerve II-XII: Pupils were equal round reactive to light extraocular movements were full, visual field were full on confrontational test. Facial sensation and strength were normal. hearing was intact to finger rubbing bilaterally. Uvula tongue midline. head turning and shoulder shrug were normal and symmetric.Tongue protrusion into cheek strength was normal. Motor: normal bulk and tone, full strength in the BUE, BLE, fine finger movements normal, no pronator drift. No focal weakness Sensory: normal and symmetric to light touch,  Coordination: finger-nose-finger, heel-to-shin bilaterally, no dysmetria Reflexes: Symmetric upper and lower plantar responses were flexor bilaterally. Gait and Station: Rising up from seated position without assistance, normal stance,  moderate stride, good arm swing, smooth turning, able to perform tiptoe, and heel walking without difficulty. Tandem gait is steady  DIAGNOSTIC DATA (LABS, IMAGING, TESTING) - I reviewed patient records, labs, notes, testing and imaging myself where available.  Lab Results  Component Value Date   WBC CANCELED 10/18/2017   HGB 13.6 04/15/2015   HCT 41.8 04/15/2015   MCV 89.7 04/15/2015   PLT 319 04/15/2015      Component Value Date/Time   NA 137 10/18/2017 1449   K 4.5 10/18/2017 1449   CL 101 10/18/2017 1449  CO2  20 10/18/2017 1449   GLUCOSE 93 10/18/2017 1449   GLUCOSE 89 04/15/2015 1512   BUN 14 10/18/2017 1449   CREATININE 0.98 10/18/2017 1449   CREATININE 0.85 04/15/2015 1512   CALCIUM 10.5 (H) 10/18/2017 1449   PROT 7.1 10/18/2017 1449   ALBUMIN 4.2 10/18/2017 1449   AST 29 10/18/2017 1449   ALT 30 10/18/2017 1449   ALKPHOS 147 (H) 10/18/2017 1449   BILITOT 0.3 10/18/2017 1449   GFRNONAA 64 10/18/2017 1449   GFRAA 74 10/18/2017 1449   Lab Results  Component Value Date   CHOL 190 10/18/2017   HDL 45 10/18/2017   LDLCALC 115 (H) 10/18/2017   TRIG 148 10/18/2017   CHOLHDL 4.2 10/18/2017    Lab Results  Component Value Date   TSH 3.670 10/18/2017      ASSESSMENT AND PLAN  57 y.o. year old female here to follow-up for migraine headaches.  She was placed on Topamax and had side effects then switched to Effexor. Her headaches had improved on Effexor when increased at last visit to 75 mg dose.  She uses Relpax acutely with relief.     PLAN:Continue  Effexor 75 mg daily Continue Relpax acutely Follow-up in 8 months Dennie Bible, [facility], [facility], APRN  [facility] [address], [place], [place] [address] [phone]

## 2017-12-17 ENCOUNTER — Encounter: Payer: Self-pay | Admitting: Nurse Practitioner

## 2017-12-17 ENCOUNTER — Ambulatory Visit: Payer: BC Managed Care – PPO | Admitting: Nurse Practitioner

## 2017-12-17 VITALS — BP 145/86 | HR 48 | Ht 64.5 in

## 2017-12-17 DIAGNOSIS — G43909 Migraine, unspecified, not intractable, without status migrainosus: Secondary | ICD-10-CM | POA: Diagnosis not present

## 2017-12-17 MED ORDER — ELETRIPTAN HYDROBROMIDE 40 MG PO TABS: 40.0000 mg | ORAL_TABLET | Freq: Two times a day (BID) | ORAL | 5 refills | Status: DC | PRN

## 2017-12-17 NOTE — Patient Instructions (Signed)
Continue  Effexor 75 mg daily Continue Relpax acutely Follow-up in 8 months

## 2017-12-18 ENCOUNTER — Ambulatory Visit (INDEPENDENT_AMBULATORY_CARE_PROVIDER_SITE_OTHER): Payer: Self-pay

## 2017-12-18 ENCOUNTER — Ambulatory Visit (INDEPENDENT_AMBULATORY_CARE_PROVIDER_SITE_OTHER): Payer: BC Managed Care – PPO | Admitting: Orthopaedic Surgery

## 2017-12-18 DIAGNOSIS — Z6841 Body Mass Index (BMI) 40.0 and over, adult: Secondary | ICD-10-CM

## 2017-12-18 DIAGNOSIS — M1712 Unilateral primary osteoarthritis, left knee: Secondary | ICD-10-CM

## 2017-12-18 DIAGNOSIS — M65331 Trigger finger, right middle finger: Secondary | ICD-10-CM

## 2017-12-18 MED ORDER — BUPIVACAINE HCL 0.5 % IJ SOLN
0.3300 mL | INTRAMUSCULAR | Status: AC | PRN
  Administered 2017-12-18: .33 mL

## 2017-12-18 MED ORDER — LIDOCAINE HCL 1 % IJ SOLN
0.3000 mL | INTRAMUSCULAR | Status: AC | PRN
  Administered 2017-12-18: .3 mL

## 2017-12-18 MED ORDER — BUPIVACAINE HCL 0.5 % IJ SOLN
2.0000 mL | INTRAMUSCULAR | Status: AC | PRN
  Administered 2017-12-18: 2 mL via INTRA_ARTICULAR

## 2017-12-18 MED ORDER — METHYLPREDNISOLONE ACETATE 40 MG/ML IJ SUSP
13.3300 mg | INTRAMUSCULAR | Status: AC | PRN
  Administered 2017-12-18: 13.33 mg

## 2017-12-18 MED ORDER — METHYLPREDNISOLONE ACETATE 40 MG/ML IJ SUSP
40.0000 mg | INTRAMUSCULAR | Status: AC | PRN
  Administered 2017-12-18: 40 mg via INTRA_ARTICULAR

## 2017-12-18 MED ORDER — LIDOCAINE HCL 1 % IJ SOLN
2.0000 mL | INTRAMUSCULAR | Status: AC | PRN
  Administered 2017-12-18: 2 mL

## 2017-12-18 NOTE — Progress Notes (Signed)
Office Visit Note   Patient: Savannah [name]           Date of Birth: 02-06-1961           MRN: [id number] Visit Date: 12/18/2017              Requested by: Lawerance Cruel, MD [place], [place] [address] PCP: Lawerance Cruel, MD   Assessment & Plan: Visit Diagnoses:  1. Primary osteoarthritis of left knee   2. Trigger finger, right middle finger   3. Body mass index 45.0-49.9, adult ([place])   4. Morbid obesity ([place])     Plan: Impression is end-stage left knee degenerative joint disease and early right middle trigger finger.  After full discussion of treatment options patient elected to have a cortisone injection for the left knee and right middle finger.  This was performed today without complication.  Patient will follow-up as needed.  Follow-Up Instructions: Return if symptoms worsen or fail to improve.   Orders:  Orders Placed This Encounter  Procedures  . XR KNEE 3 VIEW LEFT   No orders of the defined types were placed in this encounter.     Procedures: Large Joint Inj: L knee on 12/18/2017 6:08 PM Details: 22 G needle Medications: 2 mL bupivacaine 0.5 %; 2 mL lidocaine 1 %; 40 mg methylPREDNISolone acetate 40 MG/ML Outcome: tolerated well, no immediate complications Patient was prepped and draped in the usual sterile fashion.   Hand/UE Inj: R long A1 for trigger finger on 12/18/2017 6:08 PM Indications: pain Details: 25 G needle Medications: 0.3 mL lidocaine 1 %; 0.33 mL bupivacaine 0.5 %; 13.33 mg methylPREDNISolone acetate 40 MG/ML Outcome: tolerated well, no immediate complications Consent was given by the patient. Patient was prepped and draped in the usual sterile fashion.       Clinical Data: No additional findings.   Subjective: Chief Complaint  Patient presents with  . Left Knee - Pain    Savannah [name] is a 57 year old female who comes in today for follow-up of her left knee pain and right middle trigger finger.  I have seen her in  the past for right knee pain but her left knee has progressively gotten much worse over the last month.  She denies any injuries.  She does endorse constant cracking and popping and pain with prolonged sitting and activity.  The pain is mainly on the medial side.  Does not endorse any obvious mechanical symptoms.  She is also complaining of occasional triggering to her right middle finger.  Denies any injuries.  She works as an Optometrist for [employer].  She endorses worsening pain in the middle finger with increased use of the hand.  Denies any numbness and tingling.   Review of Systems  Constitutional: Negative.   HENT: Negative.   Eyes: Negative.   Respiratory: Negative.   Cardiovascular: Negative.   Endocrine: Negative.   Musculoskeletal: Negative.   Neurological: Negative.   Hematological: Negative.   Psychiatric/Behavioral: Negative.   All other systems reviewed and are negative.    Objective: Vital Signs: LMP 10/14/2010   Physical Exam  Constitutional: She is oriented to person, place, and time. She appears well-developed and well-nourished.  Pulmonary/Chest: Effort normal.  Neurological: She is alert and oriented to person, place, and time.  Skin: Skin is warm. Capillary refill takes less than 2 seconds.  Psychiatric: She has a normal mood and affect. Her behavior is normal. Judgment and thought content normal.  Nursing note  and vitals reviewed.   Ortho Exam Left knee exam shows no effusion.  Collaterals and cruciates are stable.  Normal range of motion. Right middle finger shows mild discomfort with palpation of the A1 pulley.  There is no significant triggering.  Full joint range of motion. Specialty Comments:  No specialty comments available.  Imaging: Xr Knee 3 View Left  Result Date: 12/18/2017 Advanced DJD with bone on bone medial compartment.    PMFS History: Patient Active Problem List   Diagnosis Date Noted  . Migraines 10/01/2016  . Decreased cardiac  ejection fraction 10/01/2016  . BMI 45.0-49.9, adult ([place]) 10/01/2016  . Shingles 08/03/2016  . Chronic pain of right knee 05/24/2016  . Unilateral primary osteoarthritis, right knee 05/24/2016  . Posterior tibial tendinitis, right leg 01/30/2016  . Lichen sclerosus 93/90/3009   Past Medical History:  Diagnosis Date  . Arthritis    bilateral knees, recent LCL sprain-on left knee  . Environmental allergies   . Headache(784.0)    migraines-on meds for control, relpax, ketoprophen, vicoden, muscle relaxer  . Hearing loss 2016   High frequency hearing - Bilateral   . Lichen sclerosus   . Lipoma    Right Knee  . Menorrhagia   . Pelvic pain in female   . PONV (postoperative nausea and vomiting)    ponv, slow to awake  . Seasonal allergies   . Sprain 10/25/2010   left knee  . Trigeminal [name]   . Trigger finger, right    Middle finger and right wrist     Family History  Problem Relation Age of Onset  . Skin cancer Mother   . Migraines Mother   . Heart disease Father   . Breast cancer Maternal Aunt 70  . Breast cancer Paternal Aunt 37  . Migraines Brother     Past Surgical History:  Procedure Laterality Date  . APPENDECTOMY    . DIAGNOSTIC LAPAROSCOPY  08/2006  . DILATION AND CURETTAGE OF UTERUS  2011   attempted ablation  . FUNCTIONAL ENDOSCOPIC SINUS SURGERY    . ganglion cyst removal    . HYSTEROSCOPY     failed  . LASIK    . LIPOMA EXCISION Right 07/22/2014   Procedure: EXCISION RIGHT THIGH LIPOMA;  Surgeon: Erroll Luna, MD;  Location: [facility];  Service: General;  Laterality: Right;  . ROBOTIC ASSISTED TOTAL HYSTERECTOMY  11/06/10   TLH/RSO  . SALPINGOOPHORECTOMY  11/06/2010   Procedure: SALPINGO OOPHERECTOMY;  Surgeon: Felipa Emory;  Location: [facility];  Service: Gynecology;  Laterality: Right;   Social History   Occupational History  . Occupation: [employer]  Tobacco Use  . Smoking status: Never Smoker  . Smokeless tobacco: Never Used    Substance and Sexual Activity  . Alcohol use: No    Alcohol/week: 0.0 standard drinks  . Drug use: No  . Sexual activity: Yes    Birth control/protection: Surgical    Comment: TLH/RSO

## 2017-12-20 ENCOUNTER — Other Ambulatory Visit: Payer: Self-pay | Admitting: Gastroenterology

## 2017-12-20 DIAGNOSIS — R7989 Other specified abnormal findings of blood chemistry: Secondary | ICD-10-CM

## 2017-12-20 DIAGNOSIS — R945 Abnormal results of liver function studies: Principal | ICD-10-CM

## 2017-12-22 ENCOUNTER — Encounter: Payer: Self-pay | Admitting: Obstetrics & Gynecology

## 2017-12-23 ENCOUNTER — Telehealth: Payer: Self-pay | Admitting: Obstetrics & Gynecology

## 2017-12-23 DIAGNOSIS — Z6841 Body Mass Index (BMI) 40.0 and over, adult: Secondary | ICD-10-CM

## 2017-12-23 NOTE — Telephone Encounter (Signed)
Message left to return call to [place] at [phone].   Referral pended to Dr. Leafy Ro.  Mychart message left to return my call.

## 2017-12-23 NOTE — Telephone Encounter (Signed)
Patient sent the following correspondence through [place]. Routing to triage to assist patient with request.  Dr Sabra Heck, I was wondering if I should try some kind of weight loss medication to help me lose weight.     Thoughts?    Thanks, [employer]

## 2017-12-23 NOTE — Telephone Encounter (Signed)
Patient returned call. Message from Dr. Jarvis Newcomer She accepts referral to Dr. Leafy Ro.  Referral placed and advised she will be contacted with appointment.   cc [employer]   Encounter closed.

## 2017-12-23 NOTE — Telephone Encounter (Signed)
Routing to Dr. Sabra Heck to review prior to offering office visit.   Patient has cardiac hx and BMI of 46.5.

## 2017-12-23 NOTE — Telephone Encounter (Signed)
She has chronic systolic heart failure.  I do not feel comfortable prescribe anything for her for weight loss.  However, if she would like to be seen by Dr. Dennard Nip, I would be happy to refer her.  She is a non-surgical bariatric provider.  I do think weight loss is a good idea.

## 2017-12-30 ENCOUNTER — Ambulatory Visit
Admission: RE | Admit: 2017-12-30 | Discharge: 2017-12-30 | Disposition: A | Discharge status: Home / Self Care | Payer: BC Managed Care – PPO | Source: Ambulatory Visit | Attending: Gastroenterology | Admitting: Gastroenterology

## 2018-01-28 ENCOUNTER — Encounter (INDEPENDENT_AMBULATORY_CARE_PROVIDER_SITE_OTHER): Payer: BC Managed Care – PPO

## 2018-01-28 ENCOUNTER — Telehealth: Payer: Self-pay | Admitting: *Deleted

## 2018-01-28 NOTE — Telephone Encounter (Signed)
Called Dr. Lorie Apley office. Left message on Cindy's voicemail to call back. Need Office notes for patient

## 2018-01-28 NOTE — Telephone Encounter (Signed)
-----  Message from Megan Salon, MD sent at 01/27/2018  4:24 PM EST ----- Pt was to follow up with DR. Mann regarding eleveated alk phos and calcium.  She did have an ultrasound ordered by Dr. Collene Mares in November.  Can you call and see if can get office records?  Thanks.

## 2018-01-30 NOTE — Telephone Encounter (Signed)
OV notes in Dr. Ammie Ferrier folder.

## 2018-01-30 NOTE — Telephone Encounter (Signed)
Called Dr. Lorie Apley office. Spoke to [place]. She states patient was last seen 12/19/17. She will fax office notes.   Cindy transferred me to Butch Penny, [employer] Left message to call back. We need the plan for patient after [place] 12/30/17.

## 2018-02-24 ENCOUNTER — Ambulatory Visit (INDEPENDENT_AMBULATORY_CARE_PROVIDER_SITE_OTHER): Payer: BC Managed Care – PPO | Admitting: Bariatrics

## 2018-02-24 ENCOUNTER — Encounter (INDEPENDENT_AMBULATORY_CARE_PROVIDER_SITE_OTHER): Payer: Self-pay | Admitting: Bariatrics

## 2018-02-24 VITALS — BP 138/88 | HR 83 | Temp 97.6°F | Ht 65.0 in | Wt 273.0 lb

## 2018-02-24 DIAGNOSIS — Z9189 Other specified personal risk factors, not elsewhere classified: Secondary | ICD-10-CM

## 2018-02-24 DIAGNOSIS — E559 Vitamin D deficiency, unspecified: Secondary | ICD-10-CM

## 2018-02-24 DIAGNOSIS — R0602 Shortness of breath: Secondary | ICD-10-CM

## 2018-02-24 DIAGNOSIS — R931 Abnormal findings on diagnostic imaging of heart and coronary circulation: Secondary | ICD-10-CM | POA: Diagnosis not present

## 2018-02-24 DIAGNOSIS — M25561 Pain in right knee: Secondary | ICD-10-CM

## 2018-02-24 DIAGNOSIS — R5383 Other fatigue: Secondary | ICD-10-CM

## 2018-02-24 DIAGNOSIS — Z1331 Encounter for screening for depression: Secondary | ICD-10-CM | POA: Diagnosis not present

## 2018-02-24 DIAGNOSIS — Z6841 Body Mass Index (BMI) 40.0 and over, adult: Secondary | ICD-10-CM

## 2018-02-24 DIAGNOSIS — G8929 Other chronic pain: Secondary | ICD-10-CM

## 2018-02-24 DIAGNOSIS — Z0289 Encounter for other administrative examinations: Secondary | ICD-10-CM

## 2018-02-24 DIAGNOSIS — R7309 Other abnormal glucose: Secondary | ICD-10-CM

## 2018-02-25 LAB — COMPREHENSIVE METABOLIC PANEL
ALT: 30 IU/L (ref 0–32)
AST: 24 IU/L (ref 0–40)
Albumin/Globulin Ratio: 1.4 (ref 1.2–2.2)
Albumin: 4 g/dL (ref 3.5–5.5)
Alkaline Phosphatase: 145 IU/L — ABNORMAL HIGH (ref 39–117)
BUN/Creatinine Ratio: 13 (ref 9–23)
BUN: 10 mg/dL (ref 6–24)
Bilirubin Total: 0.3 mg/dL (ref 0.0–1.2)
CALCIUM: 9.4 mg/dL (ref 8.7–10.2)
CO2: 20 mmol/L (ref 20–29)
Chloride: 105 mmol/L (ref 96–106)
Creatinine, Ser: 0.78 mg/dL (ref 0.57–1.00)
GFR calc Af Amer: 97 mL/min/{1.73_m2} (ref >59)
GFR, EST NON AFRICAN AMERICAN: 84 mL/min/{1.73_m2} (ref >59)
Globulin, Total: 2.9 g/dL (ref 1.5–4.5)
Glucose: 67 mg/dL (ref 65–99)
Potassium: 4.6 mmol/L (ref 3.5–5.2)
Sodium: 141 mmol/L (ref 134–144)
Total Protein: 6.9 g/dL (ref 6.0–8.5)

## 2018-02-25 LAB — INSULIN, RANDOM: INSULIN: 14.1 u[IU]/mL (ref 2.6–24.9)

## 2018-02-25 LAB — HEMOGLOBIN A1C
ESTIMATED AVERAGE GLUCOSE: 120 mg/dL
Hgb A1c MFr Bld: 5.8 % — ABNORMAL HIGH (ref 4.8–5.6)

## 2018-02-25 LAB — VITAMIN D 25 HYDROXY (VIT D DEFICIENCY, FRACTURES): Vit D, 25-Hydroxy: 27.9 ng/mL — ABNORMAL LOW (ref 30.0–100.0)

## 2018-02-25 NOTE — Progress Notes (Signed)
Office: [phone]  /  Fax: [phone]   Dear Dr. Sabra Heck,   Thank you for referring Savannah [name] to our clinic. The following note includes my evaluation and treatment recommendations.  HPI:   Chief Complaint: OBESITY    Savannah [name] has been referred by Savannah Salon, MD for consultation regarding her obesity and obesity related comorbidities.    Savannah [name] (MR# [id number]) is a 58 y.o. female who presents on 02/25/2018 for obesity evaluation and treatment. Current BMI is Body mass index is 45.43 kg/m.[name] Savannah [name] has been struggling with her weight for many years and has been unsuccessful in either losing weight, maintaining weight loss, or reaching her healthy weight goal.     Savannah [name] attended our information session and states she is currently in the action stage of change and ready to dedicate time achieving and maintaining a healthier weight. Savannah [name] is interested in becoming our patient and working on intensive lifestyle modifications including (but not limited to) diet, exercise and weight loss.    Savannah [name] states her family eats meals together she thinks her family will eat healthier with her her desired weight loss is 128 lbs she started gaining weight after college her heaviest weight ever was 275 lbs. she considers herself to be a "picky eater" she skips breakfast 1 to 2 times a week for time and  she is frequently drinking liquids with calories   Savannah [name] Savannah [name] feels her energy is lower than it should be. This has worsened with weight gain and has not worsened recently. Savannah [name] admits to daytime somnolence and she admits to waking up still tired. Patient is at risk for obstructive sleep apnea. Patent has a history of symptoms of daytime Savannah [name], morning Savannah [name] and morning headache. Patient generally gets 6 hours of sleep per night, and states they generally have restless sleep. Snoring is present. Apneic episodes are not present. Savannah [name] has low ejection  fraction. Epworth Sleepiness Score is 6  Dyspnea on exertion Savannah [name] notes increasing shortness of breath with certain exercises and seems to be worsening over time with weight gain. She notes getting out of breath sooner with activity than she used to. This has not gotten worse recently. Savannah [name] denies orthopnea.  Chronic Right Knee Pain Savannah [name] has chronic right knee pain and a history of osteoarthritis, which limits her activities.  Decreased Cardiac Ejection Fraction (dilated cardiomyopathy) Savannah [name] had echocardiogram July 2019 and her estimated ejection fraction was in the range of 45 to 50%. She is currently taking Coreg and Lisinopril.  Elevated Glucose Savannah [name] has a history of some elevated blood glucose readings without a diagnosis of diabetes. She denies polyphagia.  Vitamin D deficiency Savannah [name] has a diagnosis of vitamin D deficiency. She is not currently taking vit D and denies nausea, vomiting or muscle weakness.  At risk for osteopenia and osteoporosis Savannah [name] is at higher risk of osteopenia and osteoporosis due to vitamin D deficiency.   Depression Screen Savannah [name]'s Food and Mood (modified PHQ-9) score was  Depression screen PHQ 2/9 02/24/2018  Decreased Interest 1  Down, Depressed, Hopeless 0  PHQ - 2 Score 1  Altered sleeping 2  Tired, decreased energy 3  Change in appetite 1  Feeling bad or failure about yourself  0  Trouble concentrating 0  Moving slowly or fidgety/restless 0  Suicidal thoughts 0  PHQ-9 Score 7  Difficult doing work/chores Not difficult at all    ASSESSMENT AND PLAN:  Other Savannah [name] - Plan: EKG 12-Lead, Comprehensive metabolic panel  Shortness of breath on exertion  Chronic pain of right knee  Decreased cardiac ejection fraction  Elevated glucose - Plan: Hemoglobin A1c, Insulin, random  Vitamin D deficiency - Plan: VITAMIN D 25 Hydroxy (Vit-D Deficiency, Fractures)  At risk for osteoporosis  Screening for depression  Class 3  severe obesity with serious comorbidity and body mass index (BMI) of 45.0 to 49.9 in adult, unspecified obesity type ([place])  PLAN:  Savannah [name] Savannah [name] was informed that her Savannah [name] may be related to obesity, depression or many other causes. Labs will be ordered, and in the meanwhile Savannah [name] has agreed to work on diet, exercise and weight loss to help with Savannah [name]. Proper sleep hygiene was discussed including the need for 7-8 hours of quality sleep each night. A sleep study was not ordered based on symptoms and Epworth score.  Dyspnea on exertion Savannah [name]'s shortness of breath appears to be obesity related and exercise induced. She has agreed to work on weight loss and gradually increase exercise to treat her exercise induced shortness of breath. If Savannah [name] follows our instructions and loses weight without improvement of her shortness of breath, we will plan to refer to pulmonology. We will monitor this condition regularly. Savannah [name] agrees to this plan.  Chronic Right Knee Pain Savannah [name] will consider knee replacement, when her BMI is at least below 40. Savannah [name] will follow up with our clinic in 2 weeks.  Decreased Cardiac Ejection Fraction (dilated cardiomyopathy) Savannah [name] will continue her medications as prescribed and will see her cardiologist once yearly at this time. Savannah [name] agrees to follow up with our clinic in 2 weeks.  Elevated Glucose Fasting labs ( Hgb A1c, insulin level) will be obtained and results with be discussed with Savannah [name] in 2 weeks at her follow up visit. In the meanwhile Savannah [name] was started on a lower simple carbohydrate diet and will work on weight loss efforts.  Vitamin D Deficiency Savannah [name] was informed that low vitamin D levels contributes to Savannah [name] and are associated with obesity, breast, and colon cancer. We will check vitamin D level today and she will follow up for routine testing of vitamin D, at least 2-3 times per year.   At risk for osteopenia and osteoporosis Savannah [name]  was given extended  (15 minutes) osteoporosis prevention counseling today. Savannah [name] is at risk for osteopenia and osteoporosis due to her vitamin D deficiency. She was encouraged to take her vitamin D and follow her higher calcium diet and increase strengthening exercise to help strengthen her bones and decrease her risk of osteopenia and osteoporosis.  Depression Screen Savannah [name] had a mildly positive depression screening. Depression is commonly associated with obesity and often results in emotional eating behaviors. We will monitor this closely and work on CBT to help improve the non-hunger eating patterns. Referral to Psychology may be required if no improvement is seen as she continues in our clinic.  Obesity Savannah [name] is currently in the action stage of change and her goal is to continue with weight loss efforts. I recommend Savannah [name] begin the structured treatment plan as follows:  She has agreed to follow the Category 3 plan Savannah [name] has been instructed to eventually work up to a goal of 150 minutes of combined cardio and strengthening exercise per week for weight loss and overall health benefits. We discussed the following Behavioral Modification Strategies today: increase H2O intake, no skipping meals, keeping healthy foods in the home, increasing lean protein intake, decreasing simple carbohydrates, increasing vegetables and work on meal planning and intentional eating   She  was informed of the importance of frequent follow up visits to maximize her success with intensive lifestyle modifications for her multiple health conditions. She was informed we would discuss her lab results at her next visit unless there is a critical issue that needs to be addressed sooner. Savannah [name] agreed to keep her next visit at the agreed upon time to discuss these results.  ALLERGIES: Allergies  Allergen Reactions  . Dust Mite Extract Other (See Comments)    Environmental allergies    MEDICATIONS: Current  Outpatient Medications on File Prior to Visit  Medication Sig Dispense Refill  . calcium carbonate (OSCAL) 1500 (600 Ca) MG TABS tablet Take by mouth 2 (two) times daily with a meal.    . carvedilol (COREG CR) 10 MG 24 hr capsule Take 10 mg by mouth daily.    . cyclobenzaprine (FLEXERIL) 10 MG tablet Take 1 tablet (10 mg total) by mouth 3 (three) times daily as needed for muscle spasms. 30 tablet 1  . diclofenac sodium (VOLTAREN) 1 % GEL Apply topically 4 (four) times daily.    [name] eletriptan (RELPAX) 40 MG tablet Take 1 tablet (40 mg total) by mouth 2 (two) times daily as needed for migraine or headache. 10 tablet 5  . lisinopril (PRINIVIL,ZESTRIL) 10 MG tablet Take 1 tablet (10 mg total) by mouth daily. 90 tablet 3  . multivitamin-lutein (OCUVITE-LUTEIN) CAPS capsule Take 1 capsule by mouth daily.    . Omega-3 Fatty Acids (FISH OIL PO) Take 1 tablet by mouth daily.     [name] PARoxetine (PAXIL) 10 MG tablet Take 1 tablet (10 mg total) by mouth every morning. 90 tablet 1  . Potassium 99 MG TABS Take by mouth.    . venlafaxine XR (EFFEXOR-XR) 75 MG 24 hr capsule Take 1 capsule (75 mg total) by mouth daily with breakfast. 90 capsule 1  . acetaminophen (TYLENOL) 500 MG tablet Take 500 mg by mouth as directed.    [name] CALCIUM PO Take 1 tablet by mouth daily.     . carvedilol (COREG) 3.125 MG tablet Take 1 tablet (3.125 mg total) by mouth 2 (two) times daily. (Patient not taking: Reported on 02/24/2018) 180 tablet 3  . clobetasol cream (TEMOVATE) 0.05 % APPLY A PEA SIZED AMOUNT   TOPICALLY TWO TIMES A DAY  FOR 1-2 WEEKS -NOT FOR    DAILY LONG TERM USE 60 g 0  . glucosamine-chondroitin 500-400 MG tablet Take 1 tablet by mouth 3 (three) times daily.    [name] HYDROcodone-acetaminophen (NORCO/VICODIN) 5-325 MG tablet Take 1 tablet by mouth as directed.  0  . hyoscyamine (LEVSIN, ANASPAZ) 0.125 MG tablet Take 1 tablet (0.125 mg total) by mouth every 4 (four) hours as needed. (Patient not taking: Reported on 02/24/2018) 30  tablet 1  . methocarbamol (ROBAXIN) 750 MG tablet Take 750 mg by mouth as directed.      No current facility-administered medications on file prior to visit.     PAST MEDICAL HISTORY: Past Medical History:  Diagnosis Date  . Arthritis    bilateral knees, recent LCL sprain-on left knee  . CHF (congestive heart failure) ([place])   . Environmental allergies   . Fatty liver   . Headache(784.0)    migraines-on meds for control, relpax, ketoprophen, vicoden, muscle relaxer  . Hearing loss 2016   High frequency hearing - Bilateral   . Hot flashes   . Joint pain   . Lichen sclerosus   . Lipoma    Right Knee  .  Menorrhagia   . Migraines   . Necrobiosis lipoidica   . Osteoarthritis   . Overweight   . Pelvic pain in female   . PONV (postoperative nausea and vomiting)    ponv, slow to awake  . Seasonal allergies   . Sinus problem   . Sprain 10/25/2010   left knee  . Trigeminal pulse   . Trigger finger, right    Middle finger and right wrist   . Vitamin D deficiency     PAST SURGICAL HISTORY: Past Surgical History:  Procedure Laterality Date  . APPENDECTOMY    . DIAGNOSTIC LAPAROSCOPY  08/2006  . DILATION AND CURETTAGE OF UTERUS  2011   attempted ablation  . FUNCTIONAL ENDOSCOPIC SINUS SURGERY    . ganglion cyst removal    . HYSTEROSCOPY     failed  . LASIK    . LIPOMA EXCISION Right 07/22/2014   Procedure: EXCISION RIGHT THIGH LIPOMA;  Surgeon: Erroll Luna, MD;  Location: [facility];  Service: General;  Laterality: Right;  . ROBOTIC ASSISTED TOTAL HYSTERECTOMY  11/06/10   [facility]O  . SALPINGOOPHORECTOMY  11/06/2010   Procedure: SALPINGO OOPHERECTOMY;  Surgeon: Felipa Emory;  Location: [facility];  Service: Gynecology;  Laterality: Right;    SOCIAL HISTORY: Social History   Tobacco Use  . Smoking status: Never Smoker  . Smokeless tobacco: Never Used  Substance Use Topics  . Alcohol use: No    Alcohol/week: 0.0 standard drinks  . Drug use: No     FAMILY HISTORY: Family History  Problem Relation Age of Onset  . Skin cancer Mother   . Migraines Mother   . High blood pressure Mother   . Heart disease Father   . High blood pressure Father   . High Cholesterol Father   . Breast cancer Maternal Aunt 70  . Breast cancer Paternal Aunt 85  . Migraines Brother     ROS: Review of Systems  Constitutional: Positive for malaise/Savannah [name].  HENT: Positive for congestion (nasal stuffiness), hearing loss, nosebleeds and sinus pain.   Eyes:       + Wear Glasses or Contacts  Respiratory: Positive for shortness of breath (with activity).   Cardiovascular: Positive for palpitations. Negative for orthopnea.  Gastrointestinal: Negative for nausea and vomiting.  Musculoskeletal:       + Muscle or Joint Pain + Right Knee Pain  Skin:       + Dryness  Neurological: Positive for headaches.  Endo/Heme/Allergies: Bruises/bleeds easily (bruising).       Negative for polyphagia  Psychiatric/Behavioral: The patient has insomnia.     PHYSICAL EXAM: Blood pressure 138/88, pulse 83, temperature 97.6 F (36.4 C), temperature source Oral, height 5\' 5"  (1.651 m), weight 273 lb (123.8 kg), last menstrual period 10/14/2010, SpO2 97 %. Body mass index is 45.43 kg/m. Physical Exam Vitals signs reviewed.  Constitutional:      Appearance: Normal appearance. She is well-developed. She is obese.  HENT:     Head: Normocephalic and atraumatic.     Nose: Nose normal.     Mouth/Throat:     Comments: Mallampati = 4 Eyes:     General: No scleral icterus.    Extraocular Movements: Extraocular movements intact.  Neck:     Musculoskeletal: Normal range of motion and neck supple.     Thyroid: No thyromegaly.  Cardiovascular:     Rate and Rhythm: Normal rate and regular rhythm.  Pulmonary:     Effort: Pulmonary effort is normal.  No respiratory distress.  Abdominal:     Palpations: Abdomen is soft.     Tenderness: There is no abdominal tenderness.   Musculoskeletal: Normal range of motion.     Comments: Range of Motion normal in all 4 extremities  Skin:    General: Skin is warm and dry.  Neurological:     Mental Status: She is alert and oriented to person, place, and time.     Coordination: Coordination normal.  Psychiatric:        Mood and Affect: Mood normal.        Behavior: Behavior normal.     RECENT LABS AND TESTS: BMET    Component Value Date/Time   NA 141 02/24/2018 1019   K 4.6 02/24/2018 1019   CL 105 02/24/2018 1019   CO2 20 02/24/2018 1019   GLUCOSE 67 02/24/2018 1019   GLUCOSE 89 04/15/2015 1512   BUN 10 02/24/2018 1019   CREATININE 0.78 02/24/2018 1019   CREATININE 0.85 04/15/2015 1512   CALCIUM 9.4 02/24/2018 1019   GFRNONAA 84 02/24/2018 1019   GFRAA 97 02/24/2018 1019   Lab Results  Component Value Date   HGBA1C 5.8 (H) 02/24/2018   Lab Results  Component Value Date   INSULIN WILL FOLLOW 02/24/2018   CBC    Component Value Date/Time   WBC CANCELED 10/18/2017 1449   WBC 9.6 04/15/2015 1512   RBC 4.66 04/15/2015 1512   HGB 13.6 04/15/2015 1512   HGB 14.1 12/22/2013 1605   HCT 41.8 04/15/2015 1512   PLT 319 04/15/2015 1512   MCV 89.7 04/15/2015 1512   MCH 29.2 04/15/2015 1512   MCHC 32.5 04/15/2015 1512   RDW 14.0 04/15/2015 1512   LYMPHSABS 2.3 08/02/2009 1442   MONOABS 0.6 08/02/2009 1442   EOSABS 0.2 08/02/2009 1442   BASOSABS 0.0 08/02/2009 1442   Iron/TIBC/Ferritin/ %Sat No results found for: IRON, TIBC, FERRITIN, IRONPCTSAT Lipid Panel     Component Value Date/Time   CHOL 190 10/18/2017 1449   TRIG 148 10/18/2017 1449   HDL 45 10/18/2017 1449   CHOLHDL 4.2 10/18/2017 1449   CHOLHDL 4.1 04/15/2015 1512   VLDL 36 (H) 04/15/2015 1512   LDLCALC 115 (H) 10/18/2017 1449   Hepatic Function Panel     Component Value Date/Time   PROT 6.9 02/24/2018 1019   ALBUMIN 4.0 02/24/2018 1019   AST 24 02/24/2018 1019   ALT 30 02/24/2018 1019   ALKPHOS 145 (H) 02/24/2018 1019    BILITOT 0.3 02/24/2018 1019      Component Value Date/Time   TSH 3.670 10/18/2017 1449   TSH 3.41 04/15/2015 1512   TSH 3.064 11/20/2012 1544    ECG  shows NSR with a rate of 80 BPM INDIRECT CALORIMETER done today shows a VO2 of 296 and a REE of 2059.  Her calculated basal metabolic rate is 1610 thus her basal metabolic rate is better than expected.       OBESITY BEHAVIORAL INTERVENTION VISIT  Today's visit was # 1   Starting weight: 273 lbs Starting date: 02/24/2018 Today's weight : 273 lbs Today's date: 02/24/2018 Total lbs lost to date: 0   ASK: We discussed the diagnosis of obesity with Savannah [name] today and Savannah [name] agreed to give [place] permission to discuss obesity behavioral modification therapy today.  ASSESS: Kylene has the diagnosis of obesity and her BMI today is 45.43 Baeleigh is in the action stage of change   ADVISE: Annastacia was educated on the multiple  health risks of obesity as well as the benefit of weight loss to improve her health. She was advised of the need for long term treatment and the importance of lifestyle modifications to improve her current health and to decrease her risk of future health problems.  AGREE: Multiple dietary modification options and treatment options were discussed and  Rilei agreed to follow the recommendations documented in the above note.  ARRANGE: Keyonta was educated on the importance of frequent visits to treat obesity as outlined per CMS and USPSTF guidelines and agreed to schedule her next follow up appointment today.  Corey Skains, am acting as Location manager for [employer]. Owens Shark, DO  I have reviewed the above documentation for accuracy and completeness, and I agree with the above. -Jearld Lesch, DO

## 2018-02-26 DIAGNOSIS — Z6841 Body Mass Index (BMI) 40.0 and over, adult: Secondary | ICD-10-CM

## 2018-02-26 DIAGNOSIS — E669 Obesity, unspecified: Secondary | ICD-10-CM | POA: Insufficient documentation

## 2018-02-28 ENCOUNTER — Encounter (INDEPENDENT_AMBULATORY_CARE_PROVIDER_SITE_OTHER): Payer: Self-pay | Admitting: Bariatrics

## 2018-03-10 ENCOUNTER — Ambulatory Visit (INDEPENDENT_AMBULATORY_CARE_PROVIDER_SITE_OTHER): Payer: BC Managed Care – PPO | Admitting: Bariatrics

## 2018-03-10 ENCOUNTER — Encounter (INDEPENDENT_AMBULATORY_CARE_PROVIDER_SITE_OTHER): Payer: Self-pay | Admitting: Bariatrics

## 2018-03-10 VITALS — BP 129/77 | HR 60 | Temp 98.0°F | Ht 65.0 in | Wt 264.0 lb

## 2018-03-10 DIAGNOSIS — E559 Vitamin D deficiency, unspecified: Secondary | ICD-10-CM

## 2018-03-10 DIAGNOSIS — Z9189 Other specified personal risk factors, not elsewhere classified: Secondary | ICD-10-CM | POA: Diagnosis not present

## 2018-03-10 DIAGNOSIS — R7303 Prediabetes: Secondary | ICD-10-CM

## 2018-03-10 DIAGNOSIS — Z6841 Body Mass Index (BMI) 40.0 and over, adult: Secondary | ICD-10-CM

## 2018-03-10 MED ORDER — VITAMIN D (ERGOCALCIFEROL) 1.25 MG (50000 UNIT) PO CAPS: 50000.0000 [IU] | ORAL_CAPSULE | ORAL | 0 refills | Status: DC

## 2018-03-11 ENCOUNTER — Encounter (INDEPENDENT_AMBULATORY_CARE_PROVIDER_SITE_OTHER): Payer: Self-pay | Admitting: Bariatrics

## 2018-03-11 DIAGNOSIS — R7303 Prediabetes: Secondary | ICD-10-CM | POA: Insufficient documentation

## 2018-03-11 NOTE — Progress Notes (Signed)
Office: [phone]  /  Fax: [phone]   HPI:   Chief Complaint: OBESITY Savannah [name] is here to discuss her progress with her obesity treatment plan. She is on the Category 3 plan and is following her eating plan approximately 80 % of the time. She states she is exercising 0 minutes 0 times per week. Savannah [name] is doing well overall. She does not like the "greek yogurt". She felt like she was eating constantly. She did not feel hunger, but had occasional cravings.  Her weight is 264 lb (119.7 kg) today and has had a weight loss of 9 pounds over a period of 2 weeks since her last visit. She has lost 9 lbs since starting treatment with [place].  Vitamin D deficiency Savannah [name] has a diagnosis of vitamin D deficiency. She is currently taking vit D and her last level was 27.9. She denies nausea, vomiting, or muscle weakness.  Pre-Diabetes Savannah [name] has a diagnosis of pre-diabetes based on her elevated Hgb A1c and was informed this puts her at greater risk of developing diabetes. Her last A1c was 5.8 and Insulin was 14.1 on 02/24/18. She is not taking metformin currently and continues to work on diet and exercise to decrease risk of diabetes. She denies polyphagia.  At risk for diabetes Savannah [name] is at higher than average risk for developing diabetes due to her pre-diabetes and obesity. She currently denies polyuria or polydipsia.  ASSESSMENT AND PLAN:  Vitamin D deficiency - Plan: Vitamin D, Ergocalciferol, (DRISDOL) 1.25 MG (50000 UT) CAPS capsule  Prediabetes  At risk for diabetes mellitus  Class 3 severe obesity with serious comorbidity and body mass index (BMI) of 40.0 to 44.9 in adult, unspecified obesity type (Savannah [name])  PLAN:  Vitamin D Deficiency Savannah [name] was informed that low vitamin D levels contributes to fatigue and are associated with obesity, breast, and colon cancer. She agrees to continue to take prescription Vit D @50 ,000 IU every week #4 with no refills and will follow up for routine  testing of vitamin D, at least 2-3 times per year. She was informed of the risk of over-replacement of vitamin D and agrees to not increase her dose unless she discusses this with [place] first. Savannah [name] agreed to follow up in 2 weeks.  Pre-Diabetes Savannah [name] will continue to work on weight loss, exercise, and decreasing simple carbohydrates in her diet to help decrease the risk of diabetes.  She was informed that eating too many simple carbohydrates or too many calories at one sitting increases the likelihood of GI side effects. We discussed pre-diabetes. Savannah [name] agreed to follow up with [place] as directed to monitor her progress.  Diabetes risk counseling Savannah [name] was given extended (15 minutes) diabetes prevention counseling today. She is 58 y.o. female and has risk factors for diabetes including pre-diabetes and obesity. We discussed intensive lifestyle modifications today with an emphasis on weight loss as well as increasing exercise and decreasing simple carbohydrates in her diet.  Obesity Savannah [name] is currently in the action stage of change. As such, her goal is to continue with weight loss efforts. She has agreed to follow the Category 3 plan and will do meal planning and increase his water intake. Savannah [name] has been instructed to work up to a goal of 150 minutes of combined cardio and strengthening exercise per week for weight loss and overall health benefits. We discussed the following Behavioral Modification Strategies today: increasing lean protein intake, decreasing simple carbohydrates, increasing vegetables, increase H2O intake, keeping healthy foods in the home, and work  on meal planning and easy cooking plans.  Savannah [name] has agreed to follow up with our clinic in 2 weeks. She was informed of the importance of frequent follow up visits to maximize her success with intensive lifestyle modifications for her multiple health conditions.  ALLERGIES: Allergies  Allergen Reactions  . Dust Mite Extract  Other (See Comments)    Environmental allergies    MEDICATIONS: Current Outpatient Medications on File Prior to Visit  Medication Sig Dispense Refill  . acetaminophen (TYLENOL) 500 MG tablet Take 500 mg by mouth as directed.    . calcium carbonate (OSCAL) 1500 (600 Ca) MG TABS tablet Take by mouth 2 (two) times daily with a meal.    . CALCIUM PO Take 1 tablet by mouth daily.     . carvedilol (COREG CR) 10 MG 24 hr capsule Take 10 mg by mouth daily.    . carvedilol (COREG) 3.125 MG tablet Take 1 tablet (3.125 mg total) by mouth 2 (two) times daily. 180 tablet 3  . clobetasol cream (TEMOVATE) 0.05 % APPLY A PEA SIZED AMOUNT   TOPICALLY TWO TIMES A DAY  FOR 1-2 WEEKS -NOT FOR    DAILY LONG TERM USE 60 g 0  . cyclobenzaprine (FLEXERIL) 10 MG tablet Take 1 tablet (10 mg total) by mouth 3 (three) times daily as needed for muscle spasms. 30 tablet 1  . diclofenac sodium (VOLTAREN) 1 % GEL Apply topically 4 (four) times daily.    [name] eletriptan (RELPAX) 40 MG tablet Take 1 tablet (40 mg total) by mouth 2 (two) times daily as needed for migraine or headache. 10 tablet 5  . glucosamine-chondroitin 500-400 MG tablet Take 1 tablet by mouth 3 (three) times daily.    [name] HYDROcodone-acetaminophen (NORCO/VICODIN) 5-325 MG tablet Take 1 tablet by mouth as directed.  0  . hyoscyamine (LEVSIN, ANASPAZ) 0.125 MG tablet Take 1 tablet (0.125 mg total) by mouth every 4 (four) hours as needed. 30 tablet 1  . lisinopril (PRINIVIL,ZESTRIL) 10 MG tablet Take 1 tablet (10 mg total) by mouth daily. 90 tablet 3  . methocarbamol (ROBAXIN) 750 MG tablet Take 750 mg by mouth as directed.     . multivitamin-lutein (OCUVITE-LUTEIN) CAPS capsule Take 1 capsule by mouth daily.    . Omega-3 Fatty Acids (FISH OIL PO) Take 1 tablet by mouth daily.     [name] PARoxetine (PAXIL) 10 MG tablet Take 1 tablet (10 mg total) by mouth every morning. 90 tablet 1  . Potassium 99 MG TABS Take by mouth.    . venlafaxine XR (EFFEXOR-XR) 75 MG 24 hr  capsule Take 1 capsule (75 mg total) by mouth daily with breakfast. 90 capsule 1   No current facility-administered medications on file prior to visit.     PAST MEDICAL HISTORY: Past Medical History:  Diagnosis Date  . Arthritis    bilateral knees, recent LCL sprain-on left knee  . CHF (congestive heart failure) ([place])   . Environmental allergies   . Fatty liver   . Headache(784.0)    migraines-on meds for control, relpax, ketoprophen, vicoden, muscle relaxer  . Hearing loss 2016   High frequency hearing - Bilateral   . Hot flashes   . Joint pain   . Lichen sclerosus   . Lipoma    Right Knee  . Menorrhagia   . Migraines   . Necrobiosis lipoidica   . Osteoarthritis   . Overweight   . Pelvic pain in female   . PONV (postoperative nausea and  vomiting)    ponv, slow to awake  . Seasonal allergies   . Sinus problem   . Sprain 10/25/2010   left knee  . Trigeminal pulse   . Trigger finger, right    Middle finger and right wrist   . Vitamin D deficiency     PAST SURGICAL HISTORY: Past Surgical History:  Procedure Laterality Date  . APPENDECTOMY    . DIAGNOSTIC LAPAROSCOPY  08/2006  . DILATION AND CURETTAGE OF UTERUS  2011   attempted ablation  . FUNCTIONAL ENDOSCOPIC SINUS SURGERY    . ganglion cyst removal    . HYSTEROSCOPY     failed  . LASIK    . LIPOMA EXCISION Right 07/22/2014   Procedure: EXCISION RIGHT THIGH LIPOMA;  Surgeon: Erroll Luna, MD;  Location: [facility];  Service: General;  Laterality: Right;  . ROBOTIC ASSISTED TOTAL HYSTERECTOMY  11/06/10   TLH/RSO  . SALPINGOOPHORECTOMY  11/06/2010   Procedure: SALPINGO OOPHERECTOMY;  Surgeon: Felipa Emory;  Location: [facility];  Service: Gynecology;  Laterality: Right;    SOCIAL HISTORY: Social History   Tobacco Use  . Smoking status: Never Smoker  . Smokeless tobacco: Never Used  Substance Use Topics  . Alcohol use: No    Alcohol/week: 0.0 standard drinks  . Drug use: No    FAMILY  HISTORY: Family History  Problem Relation Age of Onset  . Skin cancer Mother   . Migraines Mother   . High blood pressure Mother   . Heart disease Father   . High blood pressure Father   . High Cholesterol Father   . Breast cancer Maternal Aunt 70  . Breast cancer Paternal Aunt 74  . Migraines Brother    ROS: Review of Systems  Gastrointestinal: Negative for nausea and vomiting.  Genitourinary:       Negative for polyuria.  Musculoskeletal:       Negative for muscle weakness.  Endo/Heme/Allergies: Negative for polydipsia.       Negative for polyphagia.   PHYSICAL EXAM: Pulse 60, temperature 98 F (36.7 C), temperature source Oral, height 5\' 5"  (1.651 m), weight 264 lb (119.7 kg), last menstrual period 10/14/2010, SpO2 94 %. Body mass index is 43.93 kg/m. Physical Exam Vitals signs reviewed.  Constitutional:      Appearance: Normal appearance. She is obese.  Cardiovascular:     Rate and Rhythm: Normal rate.  Pulmonary:     Effort: Pulmonary effort is normal.  Musculoskeletal: Normal range of motion.  Skin:    General: Skin is warm and dry.  Neurological:     Mental Status: She is alert and oriented to person, place, and time.  Psychiatric:        Mood and Affect: Mood normal.        Behavior: Behavior normal.    RECENT LABS AND TESTS: BMET    Component Value Date/Time   NA 141 02/24/2018 1019   K 4.6 02/24/2018 1019   CL 105 02/24/2018 1019   CO2 20 02/24/2018 1019   GLUCOSE 67 02/24/2018 1019   GLUCOSE 89 04/15/2015 1512   BUN 10 02/24/2018 1019   CREATININE 0.78 02/24/2018 1019   CREATININE 0.85 04/15/2015 1512   CALCIUM 9.4 02/24/2018 1019   GFRNONAA 84 02/24/2018 1019   GFRAA 97 02/24/2018 1019   Lab Results  Component Value Date   HGBA1C 5.8 (H) 02/24/2018   Lab Results  Component Value Date   INSULIN 14.1 02/24/2018   CBC  Component Value Date/Time   WBC CANCELED 10/18/2017 1449   WBC 9.6 04/15/2015 1512   RBC 4.66 04/15/2015 1512    HGB 13.6 04/15/2015 1512   HGB 14.1 12/22/2013 1605   HCT 41.8 04/15/2015 1512   PLT 319 04/15/2015 1512   MCV 89.7 04/15/2015 1512   MCH 29.2 04/15/2015 1512   MCHC 32.5 04/15/2015 1512   RDW 14.0 04/15/2015 1512   LYMPHSABS 2.3 08/02/2009 1442   MONOABS 0.6 08/02/2009 1442   EOSABS 0.2 08/02/2009 1442   BASOSABS 0.0 08/02/2009 1442   Iron/TIBC/Ferritin/ %Sat No results found for: IRON, TIBC, FERRITIN, IRONPCTSAT Lipid Panel     Component Value Date/Time   CHOL 190 10/18/2017 1449   TRIG 148 10/18/2017 1449   HDL 45 10/18/2017 1449   CHOLHDL 4.2 10/18/2017 1449   CHOLHDL 4.1 04/15/2015 1512   VLDL 36 (H) 04/15/2015 1512   LDLCALC 115 (H) 10/18/2017 1449   Hepatic Function Panel     Component Value Date/Time   PROT 6.9 02/24/2018 1019   ALBUMIN 4.0 02/24/2018 1019   AST 24 02/24/2018 1019   ALT 30 02/24/2018 1019   ALKPHOS 145 (H) 02/24/2018 1019   BILITOT 0.3 02/24/2018 1019      Component Value Date/Time   TSH 3.670 10/18/2017 1449   TSH 3.41 04/15/2015 1512   TSH 3.064 11/20/2012 1544   Results for Scales, Kellyann L "[name]" (MRN [id number]) as of 03/11/2018 09:35  Ref. Range 02/24/2018 10:19  Vitamin D, 25-Hydroxy Latest Ref Range: 30.0 - 100.0 ng/mL 27.9 (L)   OBESITY BEHAVIORAL INTERVENTION VISIT  Today's visit was # 2   Starting weight: 273 lbs Starting date: 02/24/2018 Today's weight : Weight: 264 lb (119.7 kg)  Today's date: 03/10/2018 Total lbs lost to date: 9  ASK: We discussed the diagnosis of obesity with Verdell Carmine Mincey today and Ketrina agreed to give [place] permission to discuss obesity behavioral modification therapy today.  ASSESS: Sparkles has the diagnosis of obesity and her BMI today is 43.9. Annaka is in the action stage of change.   ADVISE: Lakiesha was educated on the multiple health risks of obesity as well as the benefit of weight loss to improve her health. She was advised of the need for long term treatment and the importance of  lifestyle modifications to improve her current health and to decrease her risk of future health problems.  AGREE: Multiple dietary modification options and treatment options were discussed and Marabelle agreed to follow the recommendations documented in the above note.  ARRANGE: Genecis was educated on the importance of frequent visits to treat obesity as outlined per CMS and USPSTF guidelines and agreed to schedule her next follow up appointment today.  I, Marcille Blanco, am acting as Location manager for [employer]. Owens Shark, DO  I have reviewed the above documentation for accuracy and completeness, and I agree with the above. -Jearld Lesch, DO

## 2018-03-25 ENCOUNTER — Encounter (INDEPENDENT_AMBULATORY_CARE_PROVIDER_SITE_OTHER): Payer: Self-pay | Admitting: Bariatrics

## 2018-03-25 ENCOUNTER — Ambulatory Visit (INDEPENDENT_AMBULATORY_CARE_PROVIDER_SITE_OTHER): Payer: BC Managed Care – PPO | Admitting: Bariatrics

## 2018-03-25 VITALS — BP 132/79 | HR 46 | Temp 98.1°F | Ht 65.0 in | Wt 260.0 lb

## 2018-03-25 DIAGNOSIS — Z9189 Other specified personal risk factors, not elsewhere classified: Secondary | ICD-10-CM

## 2018-03-25 DIAGNOSIS — E559 Vitamin D deficiency, unspecified: Secondary | ICD-10-CM | POA: Diagnosis not present

## 2018-03-25 DIAGNOSIS — R7303 Prediabetes: Secondary | ICD-10-CM | POA: Diagnosis not present

## 2018-03-25 DIAGNOSIS — Z6841 Body Mass Index (BMI) 40.0 and over, adult: Secondary | ICD-10-CM

## 2018-03-26 DIAGNOSIS — E559 Vitamin D deficiency, unspecified: Secondary | ICD-10-CM | POA: Insufficient documentation

## 2018-03-26 NOTE — Progress Notes (Signed)
Office: [phone]  /  Fax: [phone]   HPI:   Chief Complaint: OBESITY Savannah [name] is here to discuss her progress with her obesity treatment plan. She is on the Category 3 plan and is following her eating plan approximately 80 to 90 % of the time. She states she is exercising 0 minutes 0 times per week. Breauna is doing well. She denies any struggles. She had [place] sausage and eggs. Her weight is 260 lb (117.9 kg) today and has had a weight loss of 4 pounds over a period of 2 weeks since her last visit. She has lost 13 lbs since starting treatment with [place].  Vitamin D deficiency Savannah [name] has a diagnosis of vitamin D deficiency. She is currently taking vit D and denies nausea, vomiting or muscle weakness.  Pre-Diabetes Savannah [name] has a diagnosis of prediabetes based on her elevated Hgb A1c and was informed this puts her at greater risk of developing diabetes. She is not taking medications currently and continues to work on diet and exercise to decrease risk of diabetes. She denies nausea or hypoglycemia.  ASSESSMENT AND PLAN:  Vitamin D deficiency  Prediabetes  At risk for osteoporosis  Class 3 severe obesity with serious comorbidity and body mass index (BMI) of 40.0 to 44.9 in adult, unspecified obesity type ([place])  PLAN:  Vitamin D Deficiency Jenni was informed that low vitamin D levels contributes to fatigue and are associated with obesity, breast, and colon cancer. She agrees to continue to take prescription Vit D @50 ,000 IU every week and will follow up for routine testing of vitamin D, at least 2-3 times per year. She was informed of the risk of over-replacement of vitamin D and agrees to not increase her dose unless she discusses this with [place] first.  Pre-Diabetes Jaynell will continue to work on weight loss, exercise, increasing lean protein and decreasing simple carbohydrates in her diet to help decrease the risk of diabetes. She was informed that eating too many simple  carbohydrates or too many calories at one sitting increases the likelihood of GI side effects. Zanyiah agreed to follow up with [place] as directed to monitor her progress.  I spent > than 50% of the 15 minute visit on counseling as documented in the note.  Obesity Savannah [name] is currently in the action stage of change. As such, her goal is to continue with weight loss efforts She has agreed to follow the Category 3 plan Savannah [name] has been instructed to work up to a goal of 150 minutes of combined cardio and strengthening exercise per week for weight loss and overall health benefits. We discussed the following Behavioral Modification Strategies today: increase H2O intake, no skipping meals, planning for success, increasing lean protein intake, decreasing simple carbohydrates, increasing vegetables, work on meal planning and easy cooking plans and decrease liquid calories Additional lunch options were provided to patient today.  Savannah [name] has agreed to follow up with our clinic in 2 weeks. She was informed of the importance of frequent follow up visits to maximize her success with intensive lifestyle modifications for her multiple health conditions.  ALLERGIES: Allergies  Allergen Reactions  . Dust Mite Extract Other (See Comments)    Environmental allergies    MEDICATIONS: Current Outpatient Medications on File Prior to Visit  Medication Sig Dispense Refill  . acetaminophen (TYLENOL) 500 MG tablet Take 500 mg by mouth as directed.    . calcium carbonate (OSCAL) 1500 (600 Ca) MG TABS tablet Take by mouth 2 (two) times daily with  a meal.    . CALCIUM PO Take 1 tablet by mouth daily.     . carvedilol (COREG CR) 10 MG 24 hr capsule Take 10 mg by mouth daily.    . carvedilol (COREG) 3.125 MG tablet Take 1 tablet (3.125 mg total) by mouth 2 (two) times daily. 180 tablet 3  . clobetasol cream (TEMOVATE) 0.05 % APPLY A PEA SIZED AMOUNT   TOPICALLY TWO TIMES A DAY  FOR 1-2 WEEKS -NOT FOR    DAILY LONG TERM  USE 60 g 0  . cyclobenzaprine (FLEXERIL) 10 MG tablet Take 1 tablet (10 mg total) by mouth 3 (three) times daily as needed for muscle spasms. 30 tablet 1  . diclofenac sodium (VOLTAREN) 1 % GEL Apply topically 4 (four) times daily.    [name] eletriptan (RELPAX) 40 MG tablet Take 1 tablet (40 mg total) by mouth 2 (two) times daily as needed for migraine or headache. 10 tablet 5  . glucosamine-chondroitin 500-400 MG tablet Take 1 tablet by mouth 3 (three) times daily.    [name] HYDROcodone-acetaminophen (NORCO/VICODIN) 5-325 MG tablet Take 1 tablet by mouth as directed.  0  . hyoscyamine (LEVSIN, ANASPAZ) 0.125 MG tablet Take 1 tablet (0.125 mg total) by mouth every 4 (four) hours as needed. 30 tablet 1  . lisinopril (PRINIVIL,ZESTRIL) 10 MG tablet Take 1 tablet (10 mg total) by mouth daily. 90 tablet 3  . methocarbamol (ROBAXIN) 750 MG tablet Take 750 mg by mouth as directed.     . multivitamin-lutein (OCUVITE-LUTEIN) CAPS capsule Take 1 capsule by mouth daily.    . Omega-3 Fatty Acids (FISH OIL PO) Take 1 tablet by mouth daily.     [name] PARoxetine (PAXIL) 10 MG tablet Take 1 tablet (10 mg total) by mouth every morning. 90 tablet 1  . Potassium 99 MG TABS Take by mouth.    . venlafaxine XR (EFFEXOR-XR) 75 MG 24 hr capsule Take 1 capsule (75 mg total) by mouth daily with breakfast. 90 capsule 1  . Vitamin D, Ergocalciferol, (DRISDOL) 1.25 MG (50000 UT) CAPS capsule Take 1 capsule (50,000 Units total) by mouth every 7 (seven) days. 4 capsule 0   No current facility-administered medications on file prior to visit.     PAST MEDICAL HISTORY: Past Medical History:  Diagnosis Date  . Arthritis    bilateral knees, recent LCL sprain-on left knee  . CHF (congestive heart failure) ([place])   . Environmental allergies   . Fatty liver   . Headache(784.0)    migraines-on meds for control, relpax, ketoprophen, vicoden, muscle relaxer  . Hearing loss 2016   High frequency hearing - Bilateral   . Hot flashes   . Joint  pain   . Lichen sclerosus   . Lipoma    Right Knee  . Menorrhagia   . Migraines   . Necrobiosis lipoidica   . Osteoarthritis   . Overweight   . Pelvic pain in female   . PONV (postoperative nausea and vomiting)    ponv, slow to awake  . Seasonal allergies   . Sinus problem   . Sprain 10/25/2010   left knee  . Trigeminal pulse   . Trigger finger, right    Middle finger and right wrist   . Vitamin D deficiency     PAST SURGICAL HISTORY: Past Surgical History:  Procedure Laterality Date  . APPENDECTOMY    . DIAGNOSTIC LAPAROSCOPY  08/2006  . DILATION AND CURETTAGE OF UTERUS  2011   attempted ablation  .  FUNCTIONAL ENDOSCOPIC SINUS SURGERY    . ganglion cyst removal    . HYSTEROSCOPY     failed  . LASIK    . LIPOMA EXCISION Right 07/22/2014   Procedure: EXCISION RIGHT THIGH LIPOMA;  Surgeon: Erroll Luna, MD;  Location: [facility];  Service: General;  Laterality: Right;  . ROBOTIC ASSISTED TOTAL HYSTERECTOMY  11/06/10   TLH/RSO  . SALPINGOOPHORECTOMY  11/06/2010   Procedure: SALPINGO OOPHERECTOMY;  Surgeon: Felipa Emory;  Location: [facility];  Service: Gynecology;  Laterality: Right;    SOCIAL HISTORY: Social History   Tobacco Use  . Smoking status: Never Smoker  . Smokeless tobacco: Never Used  Substance Use Topics  . Alcohol use: No    Alcohol/week: 0.0 standard drinks  . Drug use: No    FAMILY HISTORY: Family History  Problem Relation Age of Onset  . Skin cancer Mother   . Migraines Mother   . High blood pressure Mother   . Heart disease Father   . High blood pressure Father   . High Cholesterol Father   . Breast cancer Maternal Aunt 70  . Breast cancer Paternal Aunt 12  . Migraines Brother     ROS: Review of Systems  Constitutional: Positive for weight loss.  Gastrointestinal: Negative for nausea and vomiting.  Musculoskeletal:       Negative for muscle weakness  Endo/Heme/Allergies:       Negative for hypoglycemia    PHYSICAL  EXAM: Blood pressure 132/79, pulse (!) 46, temperature 98.1 F (36.7 C), temperature source Oral, height 5\' 5"  (1.651 m), weight 260 lb (117.9 kg), last menstrual period 10/14/2010, SpO2 97 %. Body mass index is 43.27 kg/m. Physical Exam Vitals signs reviewed.  Constitutional:      Appearance: Normal appearance. She is well-developed. She is obese.  Cardiovascular:     Rate and Rhythm: Normal rate.  Pulmonary:     Effort: Pulmonary effort is normal.  Musculoskeletal: Normal range of motion.  Skin:    General: Skin is warm and dry.  Neurological:     Mental Status: She is alert and oriented to person, place, and time.  Psychiatric:        Mood and Affect: Mood normal.        Behavior: Behavior normal.     RECENT LABS AND TESTS: BMET    Component Value Date/Time   NA 141 02/24/2018 1019   K 4.6 02/24/2018 1019   CL 105 02/24/2018 1019   CO2 20 02/24/2018 1019   GLUCOSE 67 02/24/2018 1019   GLUCOSE 89 04/15/2015 1512   BUN 10 02/24/2018 1019   CREATININE 0.78 02/24/2018 1019   CREATININE 0.85 04/15/2015 1512   CALCIUM 9.4 02/24/2018 1019   GFRNONAA 84 02/24/2018 1019   GFRAA 97 02/24/2018 1019   Lab Results  Component Value Date   HGBA1C 5.8 (H) 02/24/2018   Lab Results  Component Value Date   INSULIN 14.1 02/24/2018   CBC    Component Value Date/Time   WBC CANCELED 10/18/2017 1449   WBC 9.6 04/15/2015 1512   RBC 4.66 04/15/2015 1512   HGB 13.6 04/15/2015 1512   HGB 14.1 12/22/2013 1605   HCT 41.8 04/15/2015 1512   PLT 319 04/15/2015 1512   MCV 89.7 04/15/2015 1512   MCH 29.2 04/15/2015 1512   MCHC 32.5 04/15/2015 1512   RDW 14.0 04/15/2015 1512   LYMPHSABS 2.3 08/02/2009 1442   MONOABS 0.6 08/02/2009 1442   EOSABS 0.2 08/02/2009 1442  BASOSABS 0.0 08/02/2009 1442   Iron/TIBC/Ferritin/ %Sat No results found for: IRON, TIBC, FERRITIN, IRONPCTSAT Lipid Panel     Component Value Date/Time   CHOL 190 10/18/2017 1449   TRIG 148 10/18/2017 1449   HDL  45 10/18/2017 1449   CHOLHDL 4.2 10/18/2017 1449   CHOLHDL 4.1 04/15/2015 1512   VLDL 36 (H) 04/15/2015 1512   LDLCALC 115 (H) 10/18/2017 1449   Hepatic Function Panel     Component Value Date/Time   PROT 6.9 02/24/2018 1019   ALBUMIN 4.0 02/24/2018 1019   AST 24 02/24/2018 1019   ALT 30 02/24/2018 1019   ALKPHOS 145 (H) 02/24/2018 1019   BILITOT 0.3 02/24/2018 1019      Component Value Date/Time   TSH 3.670 10/18/2017 1449   TSH 3.41 04/15/2015 1512   TSH 3.064 11/20/2012 1544     Ref. Range 02/24/2018 10:19  Vitamin D, 25-Hydroxy Latest Ref Range: 30.0 - 100.0 ng/mL 27.9 (L)     OBESITY BEHAVIORAL INTERVENTION VISIT  Today's visit was # 3   Starting weight: 273 lbs Starting date: 02/24/2018 Today's weight : 260 lbs Today's date: 03/25/2018 Total lbs lost to date: 13   ASK: We discussed the diagnosis of obesity with Savannah [name] today and Savannah [name] agreed to give [place] permission to discuss obesity behavioral modification therapy today.  ASSESS: Davene has the diagnosis of obesity and her BMI today is 43.27 Lemmie is in the action stage of change   ADVISE: Savannah [name] was educated on the multiple health risks of obesity as well as the benefit of weight loss to improve her health. She was advised of the need for long term treatment and the importance of lifestyle modifications to improve her current health and to decrease her risk of future health problems.  AGREE: Multiple dietary modification options and treatment options were discussed and  Savannah [name] agreed to follow the recommendations documented in the above note.  ARRANGE: Savannah [name] was educated on the importance of frequent visits to treat obesity as outlined per CMS and USPSTF guidelines and agreed to schedule her next follow up appointment today.  Corey Skains, am acting as Location manager for [employer]. Owens Shark, DO  I have reviewed the above documentation for accuracy and completeness, and I agree with the  above. -Jearld Lesch, DO

## 2018-04-02 ENCOUNTER — Encounter (INDEPENDENT_AMBULATORY_CARE_PROVIDER_SITE_OTHER): Payer: Self-pay | Admitting: Orthopaedic Surgery

## 2018-04-02 ENCOUNTER — Telehealth (INDEPENDENT_AMBULATORY_CARE_PROVIDER_SITE_OTHER): Payer: Self-pay

## 2018-04-02 ENCOUNTER — Ambulatory Visit (INDEPENDENT_AMBULATORY_CARE_PROVIDER_SITE_OTHER): Payer: BC Managed Care – PPO | Admitting: Orthopaedic Surgery

## 2018-04-02 DIAGNOSIS — M1712 Unilateral primary osteoarthritis, left knee: Secondary | ICD-10-CM | POA: Diagnosis not present

## 2018-04-02 DIAGNOSIS — Z6841 Body Mass Index (BMI) 40.0 and over, adult: Secondary | ICD-10-CM | POA: Diagnosis not present

## 2018-04-02 MED ORDER — LIDOCAINE HCL 1 % IJ SOLN
2.0000 mL | INTRAMUSCULAR | Status: AC | PRN
  Administered 2018-04-02: 2 mL

## 2018-04-02 MED ORDER — METHYLPREDNISOLONE ACETATE 40 MG/ML IJ SUSP
40.0000 mg | INTRAMUSCULAR | Status: AC | PRN
  Administered 2018-04-02: 40 mg via INTRA_ARTICULAR

## 2018-04-02 MED ORDER — BUPIVACAINE HCL 0.5 % IJ SOLN
2.0000 mL | INTRAMUSCULAR | Status: AC | PRN
  Administered 2018-04-02: 2 mL via INTRA_ARTICULAR

## 2018-04-02 NOTE — Telephone Encounter (Signed)
Please submit for gel injection left knee- Dr Erlinda Hong.

## 2018-04-02 NOTE — Progress Notes (Signed)
Office Visit Note   Patient: Savannah [name]           Date of Birth: 1960/03/05           MRN: [id number] Visit Date: 04/02/2018              Requested by: Lawerance Cruel, [place], [place] [id number] PCP: Lawerance Cruel, MD   Assessment & Plan: Visit Diagnoses:  1. Primary osteoarthritis of left knee   2. Body mass index 40.0-44.9, adult ([place])   3. Morbid obesity ([place])     Plan: Impression is end-stage left knee degenerative joint disease.  We repeated the cortisone injection today.  We will also obtain preauthorization for Synvisc injection.  Sample of pennsaid provided today.  Patient will continue to make all efforts to lose weight.  She understands that she will likely need a knee replacement in the near future.  Follow-Up Instructions: Return if symptoms worsen or fail to improve.   Orders:  No orders of the defined types were placed in this encounter.  No orders of the defined types were placed in this encounter.     Procedures: Large Joint Inj: L knee on 04/02/2018 4:34 PM Details: 22 G needle Medications: 2 mL bupivacaine 0.5 %; 2 mL lidocaine 1 %; 40 mg methylPREDNISolone acetate 40 MG/ML Outcome: tolerated well, no immediate complications Patient was prepped and draped in the usual sterile fashion.       Clinical Data: No additional findings.   Subjective: Chief Complaint  Patient presents with  . Left Knee - Pain    Savannah [name] is a 58 year old female comes in for follow-up of her left knee pain.  Her last cortisone injection was 12/18/2017 which helped for about 4 to 6 weeks.  Her right knee feels great.  She feels that her knee is constantly popping and cracking.  She is currently in [facility] for weight loss and has recently lost 13 pounds.  She is prediabetic.   Review of Systems  Constitutional: Negative.   HENT: Negative.   Eyes: Negative.   Respiratory: Negative.   Cardiovascular: Negative.     Endocrine: Negative.   Musculoskeletal: Negative.   Neurological: Negative.   Hematological: Negative.   Psychiatric/Behavioral: Negative.   All other systems reviewed and are negative.    Objective: Vital Signs: LMP 10/14/2010   Physical Exam Vitals signs and nursing note reviewed.  Constitutional:      Appearance: She is well-developed.  Pulmonary:     Effort: Pulmonary effort is normal.  Skin:    General: Skin is warm.     Capillary Refill: Capillary refill takes less than 2 seconds.  Neurological:     Mental Status: She is alert and oriented to person, place, and time.  Psychiatric:        Behavior: Behavior normal.        Thought Content: Thought content normal.        Judgment: Judgment normal.     Ortho Exam Left knee exam shows no joint effusion.  Collaterals and cruciates are stable. Specialty Comments:  No specialty comments available.  Imaging: No results found.   PMFS History: Patient Active Problem List   Diagnosis Date Noted  . Vitamin D deficiency 03/26/2018  . Prediabetes 03/11/2018  . Class 3 severe obesity with serious comorbidity and body mass index (BMI) of 45.0 to 49.9 in adult ([place]) 02/26/2018  . Migraines 10/01/2016  . Decreased cardiac ejection  fraction 10/01/2016  . BMI 45.0-49.9, adult ([place]) 10/01/2016  . Shingles 08/03/2016  . Chronic pain of right knee 05/24/2016  . Unilateral primary osteoarthritis, right knee 05/24/2016  . Posterior tibial tendinitis, right leg 01/30/2016  . Lichen sclerosus 70/96/2836   Past Medical History:  Diagnosis Date  . Arthritis    bilateral knees, recent LCL sprain-on left knee  . CHF (congestive heart failure) ([place])   . Environmental allergies   . Fatty liver   . Headache(784.0)    migraines-on meds for control, relpax, ketoprophen, vicoden, muscle relaxer  . Hearing loss 2016   High frequency hearing - Bilateral   . Hot flashes   . Joint pain   . Lichen sclerosus   . Lipoma    Right Knee   . Menorrhagia   . Migraines   . Necrobiosis lipoidica   . Osteoarthritis   . Overweight   . Pelvic pain in female   . PONV (postoperative nausea and vomiting)    ponv, slow to awake  . Seasonal allergies   . Sinus problem   . Sprain 10/25/2010   left knee  . Trigeminal [name]   . Trigger finger, right    Middle finger and right wrist   . Vitamin D deficiency     Family History  Problem Relation Age of Onset  . Skin cancer Mother   . Migraines Mother   . High blood pressure Mother   . Heart disease Father   . High blood pressure Father   . High Cholesterol Father   . Breast cancer Maternal Aunt 70  . Breast cancer Paternal Aunt 71  . Migraines Brother     Past Surgical History:  Procedure Laterality Date  . APPENDECTOMY    . DIAGNOSTIC LAPAROSCOPY  08/2006  . DILATION AND CURETTAGE OF UTERUS  2011   attempted ablation  . FUNCTIONAL ENDOSCOPIC SINUS SURGERY    . ganglion cyst removal    . HYSTEROSCOPY     failed  . LASIK    . LIPOMA EXCISION Right 07/22/2014   Procedure: EXCISION RIGHT THIGH LIPOMA;  Surgeon: Erroll Luna, MD;  Location: [facility];  Service: General;  Laterality: Right;  . ROBOTIC ASSISTED TOTAL HYSTERECTOMY  11/06/10   TLH/RSO  . SALPINGOOPHORECTOMY  11/06/2010   Procedure: SALPINGO OOPHERECTOMY;  Surgeon: Felipa Emory;  Location: [facility];  Service: Gynecology;  Laterality: Right;   Social History   Occupational History  . Occupation: Administrator, [employer]  Tobacco Use  . Smoking status: Never Smoker  . Smokeless tobacco: Never Used  Substance and Sexual Activity  . Alcohol use: No    Alcohol/week: 0.0 standard drinks  . Drug use: No  . Sexual activity: Yes    Birth control/protection: Surgical    Comment: TLH/RSO

## 2018-04-03 ENCOUNTER — Encounter (INDEPENDENT_AMBULATORY_CARE_PROVIDER_SITE_OTHER): Payer: Self-pay | Admitting: Orthopaedic Surgery

## 2018-04-04 NOTE — Telephone Encounter (Signed)
Noted  

## 2018-04-07 ENCOUNTER — Telehealth (INDEPENDENT_AMBULATORY_CARE_PROVIDER_SITE_OTHER): Payer: Self-pay

## 2018-04-07 NOTE — Telephone Encounter (Signed)
Submitted VOB for SynvsicOne, left knee.

## 2018-04-07 NOTE — Telephone Encounter (Signed)
Permanent handicap please.  Thanks.

## 2018-04-09 ENCOUNTER — Other Ambulatory Visit: Payer: Self-pay | Admitting: Obstetrics & Gynecology

## 2018-04-09 ENCOUNTER — Ambulatory Visit (INDEPENDENT_AMBULATORY_CARE_PROVIDER_SITE_OTHER): Payer: BC Managed Care – PPO | Admitting: Bariatrics

## 2018-04-09 ENCOUNTER — Encounter (INDEPENDENT_AMBULATORY_CARE_PROVIDER_SITE_OTHER): Payer: Self-pay | Admitting: Bariatrics

## 2018-04-09 VITALS — BP 111/73 | HR 56 | Temp 97.6°F | Ht 65.0 in | Wt 255.0 lb

## 2018-04-09 DIAGNOSIS — Z9189 Other specified personal risk factors, not elsewhere classified: Secondary | ICD-10-CM

## 2018-04-09 DIAGNOSIS — E559 Vitamin D deficiency, unspecified: Secondary | ICD-10-CM

## 2018-04-09 DIAGNOSIS — Z6841 Body Mass Index (BMI) 40.0 and over, adult: Secondary | ICD-10-CM

## 2018-04-09 DIAGNOSIS — Z1231 Encounter for screening mammogram for malignant neoplasm of breast: Secondary | ICD-10-CM

## 2018-04-09 DIAGNOSIS — R7303 Prediabetes: Secondary | ICD-10-CM

## 2018-04-09 MED ORDER — VITAMIN D3 1.25 MG (50000 UT) PO CAPS: 1.0000 | ORAL_CAPSULE | ORAL | 0 refills | Status: DC

## 2018-04-10 NOTE — Progress Notes (Signed)
Office: [phone]  /  Fax: [phone]   HPI:   Chief Complaint: OBESITY Savannah [name] is here to discuss her progress with her obesity treatment plan. She is on the  follow the Category 3 plan and is following her eating plan approximately 80 to 90 % of the time. She states she is exercising 0 minutes 0 times per week. Savannah [name] is doing overall. She is working on getting her BMI under 40, so that she can have left knee surgery. She is having a lot of pain. Her weight is 255 lb (115.7 kg) today and has had a weight loss of 5 pounds over a period of 2 weeks since her last visit. She has lost 18 lbs since starting treatment with [place].  Vitamin D deficiency Savannah [name] has a diagnosis of vitamin D deficiency. Her last vitamin D level was at 27.9 She is currently taking vit D and denies nausea, vomiting or muscle weakness.  At risk for osteopenia and osteoporosis Savannah [name] is at higher risk of osteopenia and osteoporosis due to vitamin D deficiency.   Pre-Diabetes Savannah [name] has a diagnosis of prediabetes based on her elevated Hgb A1c and was informed this puts her at greater risk of developing diabetes. Her last A1c was at 5.8 and last insulin level was at 14.1 She is not taking medications currently and continues to work on diet and exercise to decrease risk of diabetes. She denies hypoglycemia.  ASSESSMENT AND PLAN:  Vitamin D deficiency - Plan: Cholecalciferol (VITAMIN D3) 1.25 MG (50000 UT) CAPS  Prediabetes  At risk for osteoporosis  Class 3 severe obesity with serious comorbidity and body mass index (BMI) of 40.0 to 44.9 in adult, unspecified obesity type ([place])  PLAN:  Vitamin D Deficiency Savannah [name] was informed that low vitamin D levels contributes to fatigue and are associated with obesity, breast, and colon cancer. She agrees to continue to take prescription Vit D3 @50 ,000 IU every week #4 with no refills and will follow up for routine testing of vitamin D, at least 2-3 times per year. She  was informed of the risk of over-replacement of vitamin D and agrees to not increase her dose unless she discusses this with [place] first. Savannah [name] agrees to follow up as directed.  At risk for osteopenia and osteoporosis Savannah [name] was given extended  (15 minutes) osteoporosis prevention counseling today. Savannah [name] is at risk for osteopenia and osteoporosis due to her vitamin D deficiency. She was encouraged to take her vitamin D and follow her higher calcium diet and increase strengthening exercise to help strengthen her bones and decrease her risk of osteopenia and osteoporosis.  Pre-Diabetes Savannah [name] will continue to work on weight loss, exercise, increasing lean protein and decreasing simple carbohydrates in her diet to help decrease the risk of diabetes. She was informed that eating too many simple carbohydrates or too many calories at one sitting increases the likelihood of GI side effects.  Savannah [name] agreed to follow up with [place] as directed to monitor her progress.  Obesity Savannah [name] is currently in the action stage of change. As such, her goal is to continue with weight loss efforts She has agreed to follow the Category 3 plan Savannah [name] has been instructed to work up to a goal of 150 minutes of combined cardio and strengthening exercise per week for weight loss and overall health benefits. We discussed the following Behavioral Modification Strategies today: increase H2O intake (64 ounces), no skipping meals, keeping healthy foods in the home, increasing lean protein intake, decreasing simple carbohydrates,  increasing vegetables, decrease eating out and work on meal planning and easy cooking plans  Savannah [name] has agreed to follow up with our clinic in 2 weeks. She was informed of the importance of frequent follow up visits to maximize her success with intensive lifestyle modifications for her multiple health conditions.  ALLERGIES: Allergies  Allergen Reactions  . Dust Mite Extract Other (See Comments)     Environmental allergies    MEDICATIONS: Current Outpatient Medications on File Prior to Visit  Medication Sig Dispense Refill  . acetaminophen (TYLENOL) 500 MG tablet Take 500 mg by mouth as directed.    . calcium carbonate (OSCAL) 1500 (600 Ca) MG TABS tablet Take by mouth 2 (two) times daily with a meal.    . CALCIUM PO Take 1 tablet by mouth daily.     . carvedilol (COREG CR) 10 MG 24 hr capsule Take 10 mg by mouth daily.    . carvedilol (COREG) 3.125 MG tablet Take 1 tablet (3.125 mg total) by mouth 2 (two) times daily. 180 tablet 3  . clobetasol cream (TEMOVATE) 0.05 % APPLY A PEA SIZED AMOUNT   TOPICALLY TWO TIMES A DAY  FOR 1-2 WEEKS -NOT FOR    DAILY LONG TERM USE 60 g 0  . cyclobenzaprine (FLEXERIL) 10 MG tablet Take 1 tablet (10 mg total) by mouth 3 (three) times daily as needed for muscle spasms. 30 tablet 1  . diclofenac sodium (VOLTAREN) 1 % GEL Apply topically 4 (four) times daily.    [name] eletriptan (RELPAX) 40 MG tablet Take 1 tablet (40 mg total) by mouth 2 (two) times daily as needed for migraine or headache. 10 tablet 5  . glucosamine-chondroitin 500-400 MG tablet Take 1 tablet by mouth 3 (three) times daily.    [name] HYDROcodone-acetaminophen (NORCO/VICODIN) 5-325 MG tablet Take 1 tablet by mouth as directed.  0  . hyoscyamine (LEVSIN, ANASPAZ) 0.125 MG tablet Take 1 tablet (0.125 mg total) by mouth every 4 (four) hours as needed. 30 tablet 1  . lisinopril (PRINIVIL,ZESTRIL) 10 MG tablet Take 1 tablet (10 mg total) by mouth daily. 90 tablet 3  . methocarbamol (ROBAXIN) 750 MG tablet Take 750 mg by mouth as directed.     . multivitamin-lutein (OCUVITE-LUTEIN) CAPS capsule Take 1 capsule by mouth daily.    . Omega-3 Fatty Acids (FISH OIL PO) Take 1 tablet by mouth daily.     [name] PARoxetine (PAXIL) 10 MG tablet Take 1 tablet (10 mg total) by mouth every morning. 90 tablet 1  . Potassium 99 MG TABS Take by mouth.    . venlafaxine XR (EFFEXOR-XR) 75 MG 24 hr capsule Take 1 capsule (75  mg total) by mouth daily with breakfast. 90 capsule 1   No current facility-administered medications on file prior to visit.     PAST MEDICAL HISTORY: Past Medical History:  Diagnosis Date  . Arthritis    bilateral knees, recent LCL sprain-on left knee  . CHF (congestive heart failure) ([place])   . Environmental allergies   . Fatty liver   . Headache(784.0)    migraines-on meds for control, relpax, ketoprophen, vicoden, muscle relaxer  . Hearing loss 2016   High frequency hearing - Bilateral   . Hot flashes   . Joint pain   . Lichen sclerosus   . Lipoma    Right Knee  . Menorrhagia   . Migraines   . Necrobiosis lipoidica   . Osteoarthritis   . Overweight   . Pelvic pain in female   .  PONV (postoperative nausea and vomiting)    ponv, slow to awake  . Seasonal allergies   . Sinus problem   . Sprain 10/25/2010   left knee  . Trigeminal pulse   . Trigger finger, right    Middle finger and right wrist   . Vitamin D deficiency     PAST SURGICAL HISTORY: Past Surgical History:  Procedure Laterality Date  . APPENDECTOMY    . DIAGNOSTIC LAPAROSCOPY  08/2006  . DILATION AND CURETTAGE OF UTERUS  2011   attempted ablation  . FUNCTIONAL ENDOSCOPIC SINUS SURGERY    . ganglion cyst removal    . HYSTEROSCOPY     failed  . LASIK    . LIPOMA EXCISION Right 07/22/2014   Procedure: EXCISION RIGHT THIGH LIPOMA;  Surgeon: Erroll Luna, MD;  Location: [facility];  Service: General;  Laterality: Right;  . ROBOTIC ASSISTED TOTAL HYSTERECTOMY  11/06/10   TLH/RSO  . SALPINGOOPHORECTOMY  11/06/2010   Procedure: SALPINGO OOPHERECTOMY;  Surgeon: Felipa Emory;  Location: [facility];  Service: Gynecology;  Laterality: Right;    SOCIAL HISTORY: Social History   Tobacco Use  . Smoking status: Never Smoker  . Smokeless tobacco: Never Used  Substance Use Topics  . Alcohol use: No    Alcohol/week: 0.0 standard drinks  . Drug use: No    FAMILY HISTORY: Family History    Problem Relation Age of Onset  . Skin cancer Mother   . Migraines Mother   . High blood pressure Mother   . Heart disease Father   . High blood pressure Father   . High Cholesterol Father   . Breast cancer Maternal Aunt 70  . Breast cancer Paternal Aunt 59  . Migraines Brother     ROS: Review of Systems  Constitutional: Positive for weight loss.  Gastrointestinal: Negative for nausea and vomiting.  Musculoskeletal:       Negative for muscle weakness  Endo/Heme/Allergies:       Negative for hypoglycemia    PHYSICAL EXAM: Blood pressure 111/73, pulse (!) 56, temperature 97.6 F (36.4 C), temperature source Oral, height 5\' 5"  (1.651 m), weight 255 lb (115.7 kg), last menstrual period 10/14/2010, SpO2 95 %. Body mass index is 42.43 kg/m. Physical Exam Vitals signs reviewed.  Constitutional:      Appearance: Normal appearance. She is well-developed. She is obese.  Cardiovascular:     Rate and Rhythm: Normal rate.  Pulmonary:     Effort: Pulmonary effort is normal.  Musculoskeletal: Normal range of motion.  Skin:    General: Skin is warm and dry.  Neurological:     Mental Status: She is alert and oriented to person, place, and time.  Psychiatric:        Mood and Affect: Mood normal.        Behavior: Behavior normal.     RECENT LABS AND TESTS: BMET    Component Value Date/Time   NA 141 02/24/2018 1019   K 4.6 02/24/2018 1019   CL 105 02/24/2018 1019   CO2 20 02/24/2018 1019   GLUCOSE 67 02/24/2018 1019   GLUCOSE 89 04/15/2015 1512   BUN 10 02/24/2018 1019   CREATININE 0.78 02/24/2018 1019   CREATININE 0.85 04/15/2015 1512   CALCIUM 9.4 02/24/2018 1019   GFRNONAA 84 02/24/2018 1019   GFRAA 97 02/24/2018 1019   Lab Results  Component Value Date   HGBA1C 5.8 (H) 02/24/2018   Lab Results  Component Value Date   INSULIN  14.1 02/24/2018   CBC    Component Value Date/Time   WBC CANCELED 10/18/2017 1449   WBC 9.6 04/15/2015 1512   RBC 4.66 04/15/2015  1512   HGB 13.6 04/15/2015 1512   HGB 14.1 12/22/2013 1605   HCT 41.8 04/15/2015 1512   PLT 319 04/15/2015 1512   MCV 89.7 04/15/2015 1512   MCH 29.2 04/15/2015 1512   MCHC 32.5 04/15/2015 1512   RDW 14.0 04/15/2015 1512   LYMPHSABS 2.3 08/02/2009 1442   MONOABS 0.6 08/02/2009 1442   EOSABS 0.2 08/02/2009 1442   BASOSABS 0.0 08/02/2009 1442   Iron/TIBC/Ferritin/ %Sat No results found for: IRON, TIBC, FERRITIN, IRONPCTSAT Lipid Panel     Component Value Date/Time   CHOL 190 10/18/2017 1449   TRIG 148 10/18/2017 1449   HDL 45 10/18/2017 1449   CHOLHDL 4.2 10/18/2017 1449   CHOLHDL 4.1 04/15/2015 1512   VLDL 36 (H) 04/15/2015 1512   LDLCALC 115 (H) 10/18/2017 1449   Hepatic Function Panel     Component Value Date/Time   PROT 6.9 02/24/2018 1019   ALBUMIN 4.0 02/24/2018 1019   AST 24 02/24/2018 1019   ALT 30 02/24/2018 1019   ALKPHOS 145 (H) 02/24/2018 1019   BILITOT 0.3 02/24/2018 1019      Component Value Date/Time   TSH 3.670 10/18/2017 1449   TSH 3.41 04/15/2015 1512   TSH 3.064 11/20/2012 1544   Results for Scripter, Savannah L "[name]" (MRN [id number]) as of 04/10/2018 10:02  Ref. Range 02/24/2018 10:19  Vitamin D, 25-Hydroxy Latest Ref Range: 30.0 - 100.0 ng/mL 27.9 (L)     OBESITY BEHAVIORAL INTERVENTION VISIT  Today's visit was # 4   Starting weight: 273 lbs Starting date: 02/24/2018 Today's weight : 255 lbs Today's date: 04/09/2018 Total lbs lost to date: 18     04/09/2018  Height 5\' 5"  (1.651 m)  Weight 255 lb (115.7 kg)  BMI (Calculated) 42.43  BLOOD PRESSURE - SYSTOLIC 709  BLOOD PRESSURE - DIASTOLIC 73   Body Fat % 50 %  Total Body Water (lbs) 92.2 lbs   ASK: We discussed the diagnosis of obesity with Verdell Carmine Pacey today and Walsie agreed to give [place] permission to discuss obesity behavioral modification therapy today.  ASSESS: Zylah has the diagnosis of obesity and her BMI today is 42.43 Savannah [name] is in the action stage of change    ADVISE: Rayme was educated on the multiple health risks of obesity as well as the benefit of weight loss to improve her health. She was advised of the need for long term treatment and the importance of lifestyle modifications to improve her current health and to decrease her risk of future health problems.  AGREE: Multiple dietary modification options and treatment options were discussed and  Denean agreed to follow the recommendations documented in the above note.  ARRANGE: Ilina was educated on the importance of frequent visits to treat obesity as outlined per CMS and USPSTF guidelines and agreed to schedule her next follow up appointment today.  Corey Skains, am acting as Location manager for [employer]. Owens Shark, DO  I have reviewed the above documentation for accuracy and completeness, and I agree with the above. -Jearld Lesch, DO

## 2018-04-13 ENCOUNTER — Encounter: Payer: Self-pay | Admitting: Obstetrics & Gynecology

## 2018-04-14 ENCOUNTER — Other Ambulatory Visit: Payer: Self-pay | Admitting: Nurse Practitioner

## 2018-04-14 ENCOUNTER — Other Ambulatory Visit: Payer: Self-pay | Admitting: *Deleted

## 2018-04-14 ENCOUNTER — Telehealth (INDEPENDENT_AMBULATORY_CARE_PROVIDER_SITE_OTHER): Payer: Self-pay

## 2018-04-14 MED ORDER — CLOBETASOL PROPIONATE 0.05 % EX CREA: TOPICAL_CREAM | CUTANEOUS | 0 refills | Status: DC

## 2018-04-14 NOTE — Telephone Encounter (Signed)
Can you please send in a prescription to [facility] for clobetasol?

## 2018-04-14 NOTE — Telephone Encounter (Signed)
PA required for SynviscOne, left knee. Faxed completed PA form to [facility] at [phone].

## 2018-04-14 NOTE — Telephone Encounter (Signed)
Medication refill request: temovate 0.05% Last AEX:  10/18/17 Next AEX: 02/17/19 Last MMG (if hormonal medication request): 04/17/17 Bi-rads 1 neg  Refill authorized: #[id number] with 0 rf

## 2018-04-21 ENCOUNTER — Encounter (INDEPENDENT_AMBULATORY_CARE_PROVIDER_SITE_OTHER): Payer: Self-pay | Admitting: Radiology

## 2018-04-21 NOTE — Progress Notes (Unsigned)
IC patient LMVM to call me back. Ok to sched for Synvisc One left knee. Ins covers at 100% after an $80 copay. (poss $160 copay- see VOB)   Auth #[id number], for 48 units, valid 04/14/18 through 04/14/2019. Will schedule with Erlinda Hong when she calls me back.

## 2018-04-23 ENCOUNTER — Telehealth (INDEPENDENT_AMBULATORY_CARE_PROVIDER_SITE_OTHER): Payer: Self-pay | Admitting: Radiology

## 2018-04-23 NOTE — Telephone Encounter (Signed)
IC on 04/22/18 and LM for patient to call to schedule Synvisc One if she wants to proceed.  $80 copay, possible $160 copay, we will not know until we file with ins. Buy and bill, Xu patient, ok to schedule if patient calls.

## 2018-04-24 ENCOUNTER — Encounter (INDEPENDENT_AMBULATORY_CARE_PROVIDER_SITE_OTHER): Payer: Self-pay | Admitting: Bariatrics

## 2018-04-24 ENCOUNTER — Ambulatory Visit (INDEPENDENT_AMBULATORY_CARE_PROVIDER_SITE_OTHER): Payer: BC Managed Care – PPO | Admitting: Bariatrics

## 2018-04-24 ENCOUNTER — Other Ambulatory Visit: Payer: Self-pay

## 2018-04-24 VITALS — BP 114/49 | HR 78 | Temp 97.9°F | Ht 65.0 in | Wt 257.0 lb

## 2018-04-24 DIAGNOSIS — E559 Vitamin D deficiency, unspecified: Secondary | ICD-10-CM

## 2018-04-24 DIAGNOSIS — Z6841 Body Mass Index (BMI) 40.0 and over, adult: Secondary | ICD-10-CM

## 2018-04-24 DIAGNOSIS — R7303 Prediabetes: Secondary | ICD-10-CM

## 2018-04-24 DIAGNOSIS — Z9189 Other specified personal risk factors, not elsewhere classified: Secondary | ICD-10-CM

## 2018-04-24 MED ORDER — VITAMIN D3 1.25 MG (50000 UT) PO CAPS: 1.0000 | ORAL_CAPSULE | ORAL | 0 refills | Status: DC

## 2018-04-24 NOTE — Progress Notes (Signed)
Office: [phone]  /  Fax: [phone]   HPI:   Chief Complaint: OBESITY Savannah [name] is here to discuss her progress with her obesity treatment plan. She is on the Category 3 plan and is following her eating plan approximately 60 % of the time. She states she is exercising 0 minutes 0 times per week. Savannah [name] has done well, but she has struggled over the last few weeks. She had been to a conference. Her weight is 257 lb (116.6 kg) today and has had a weight gain of 2 pounds over a period of 2 weeks since her last visit. She has lost 16 lbs since starting treatment with [place].  Vitamin D deficiency Savannah [name] has a diagnosis of vitamin D deficiency. Her last vitamin D level was at 27.9 She is currently taking vit D and denies nausea, vomiting or muscle weakness.  At risk for osteopenia and osteoporosis Savannah [name] is at higher risk of osteopenia and osteoporosis due to vitamin D deficiency.   Pre-Diabetes Savannah [name] has a diagnosis of prediabetes based on her elevated Hgb A1c and was informed this puts her at greater risk of developing diabetes. Her last A1c was at 5.8 and last insulin level was at 14.1 She is not taking medications currently and continues to work on diet and exercise to decrease risk of diabetes. She denies polyphagia.  ASSESSMENT AND PLAN:  Vitamin D deficiency - Plan: Cholecalciferol (VITAMIN D3) 1.25 MG (50000 UT) CAPS  Prediabetes  At risk for osteoporosis  Class 3 severe obesity with serious comorbidity and body mass index (BMI) of 40.0 to 44.9 in adult, unspecified obesity type ([place])  PLAN:  Vitamin D Deficiency Savannah [name] was informed that low vitamin D levels contributes to fatigue and are associated with obesity, breast, and colon cancer. She agrees to continue to take prescription Vit D @50 ,000 IU every week #4 with no refills and will follow up for routine testing of vitamin D, at least 2-3 times per year. She was informed of the risk of over-replacement of vitamin D  and agrees to not increase her dose unless she discusses this with [place] first. Savannah [name] agrees to follow up as directed.  At risk for osteopenia and osteoporosis Savannah [name] was given extended  (15 minutes) osteoporosis prevention counseling today. Savannah [name] is at risk for osteopenia and osteoporosis due to her vitamin D deficiency. She was encouraged to take her vitamin D and follow her higher calcium diet and increase strengthening exercise to help strengthen her bones and decrease her risk of osteopenia and osteoporosis.  Pre-Diabetes Savannah [name] will continue to work on weight loss, exercise, increasing lean protein and decreasing simple carbohydrates in her diet to help decrease the risk of diabetes. She was informed that eating too many simple carbohydrates or too many calories at one sitting increases the likelihood of GI side effects. Savannah [name] agreed to follow up with [place] as directed to monitor her progress.  Obesity Savannah [name] is currently in the action stage of change. As such, her goal is to continue with weight loss efforts She has agreed to follow the Category 3 plan Savannah [name] has been instructed to work up to a goal of 150 minutes of combined cardio and strengthening exercise per week for weight loss and overall health benefits. We discussed the following Behavioral Modification Strategies today: increase H2O intake, no skipping meals, keeping healthy foods in the home, increasing lean protein intake, decreasing simple carbohydrates , increasing vegetables, decrease eating out and work on meal planning and easy cooking plans Dinner options  were provided to patient today.  Savannah [name] has agreed to follow up with our clinic in 2 weeks. She was informed of the importance of frequent follow up visits to maximize her success with intensive lifestyle modifications for her multiple health conditions.  ALLERGIES: Allergies  Allergen Reactions  . Dust Mite Extract Other (See Comments)    Environmental allergies     MEDICATIONS: Current Outpatient Medications on File Prior to Visit  Medication Sig Dispense Refill  . acetaminophen (TYLENOL) 500 MG tablet Take 500 mg by mouth as directed.    . calcium carbonate (OSCAL) 1500 (600 Ca) MG TABS tablet Take by mouth 2 (two) times daily with a meal.    . CALCIUM PO Take 1 tablet by mouth daily.     . carvedilol (COREG CR) 10 MG 24 hr capsule Take 10 mg by mouth daily.    . carvedilol (COREG) 3.125 MG tablet Take 1 tablet (3.125 mg total) by mouth 2 (two) times daily. 180 tablet 3  . clobetasol cream (TEMOVATE) 0.05 % APPLY A PEA SIZED AMOUNT   TOPICALLY TWO TIMES A DAY  FOR 1-2 WEEKS -NOT FOR    DAILY LONG TERM USE 60 g 0  . cyclobenzaprine (FLEXERIL) 10 MG tablet Take 1 tablet (10 mg total) by mouth 3 (three) times daily as needed for muscle spasms. 30 tablet 1  . diclofenac sodium (VOLTAREN) 1 % GEL Apply topically 4 (four) times daily.    [name] eletriptan (RELPAX) 40 MG tablet Take 1 tablet (40 mg total) by mouth 2 (two) times daily as needed for migraine or headache. 10 tablet 5  . glucosamine-chondroitin 500-400 MG tablet Take 1 tablet by mouth 3 (three) times daily.    [name] HYDROcodone-acetaminophen (NORCO/VICODIN) 5-325 MG tablet Take 1 tablet by mouth as directed.  0  . hyoscyamine (LEVSIN, ANASPAZ) 0.125 MG tablet Take 1 tablet (0.125 mg total) by mouth every 4 (four) hours as needed. 30 tablet 1  . lisinopril (PRINIVIL,ZESTRIL) 10 MG tablet Take 1 tablet (10 mg total) by mouth daily. 90 tablet 3  . methocarbamol (ROBAXIN) 750 MG tablet Take 750 mg by mouth as directed.     . multivitamin-lutein (OCUVITE-LUTEIN) CAPS capsule Take 1 capsule by mouth daily.    . Omega-3 Fatty Acids (FISH OIL PO) Take 1 tablet by mouth daily.     [name] PARoxetine (PAXIL) 10 MG tablet Take 1 tablet (10 mg total) by mouth every morning. 90 tablet 1  . Potassium 99 MG TABS Take by mouth.    . venlafaxine XR (EFFEXOR-XR) 75 MG 24 hr capsule TAKE 1 CAPSULE DAILY WITH  BREAKFAST 90  capsule 1   No current facility-administered medications on file prior to visit.     PAST MEDICAL HISTORY: Past Medical History:  Diagnosis Date  . Arthritis    bilateral knees, recent LCL sprain-on left knee  . CHF (congestive heart failure) ([place])   . Environmental allergies   . Fatty liver   . Headache(784.0)    migraines-on meds for control, relpax, ketoprophen, vicoden, muscle relaxer  . Hearing loss 2016   High frequency hearing - Bilateral   . Hot flashes   . Joint pain   . Lichen sclerosus   . Lipoma    Right Knee  . Menorrhagia   . Migraines   . Necrobiosis lipoidica   . Osteoarthritis   . Overweight   . Pelvic pain in female   . PONV (postoperative nausea and vomiting)    ponv, slow  to awake  . Seasonal allergies   . Sinus problem   . Sprain 10/25/2010   left knee  . Trigeminal pulse   . Trigger finger, right    Middle finger and right wrist   . Vitamin D deficiency     PAST SURGICAL HISTORY: Past Surgical History:  Procedure Laterality Date  . APPENDECTOMY    . DIAGNOSTIC LAPAROSCOPY  08/2006  . DILATION AND CURETTAGE OF UTERUS  2011   attempted ablation  . FUNCTIONAL ENDOSCOPIC SINUS SURGERY    . ganglion cyst removal    . HYSTEROSCOPY     failed  . LASIK    . LIPOMA EXCISION Right 07/22/2014   Procedure: EXCISION RIGHT THIGH LIPOMA;  Surgeon: Erroll Luna, MD;  Location: [facility];  Service: General;  Laterality: Right;  . ROBOTIC ASSISTED TOTAL HYSTERECTOMY  11/06/10   TLH/RSO  . SALPINGOOPHORECTOMY  11/06/2010   Procedure: SALPINGO OOPHERECTOMY;  Surgeon: Felipa Emory;  Location: [facility];  Service: Gynecology;  Laterality: Right;    SOCIAL HISTORY: Social History   Tobacco Use  . Smoking status: Never Smoker  . Smokeless tobacco: Never Used  Substance Use Topics  . Alcohol use: No    Alcohol/week: 0.0 standard drinks  . Drug use: No    FAMILY HISTORY: Family History  Problem Relation Age of Onset  . Skin cancer  Mother   . Migraines Mother   . High blood pressure Mother   . Heart disease Father   . High blood pressure Father   . High Cholesterol Father   . Breast cancer Maternal Aunt 70  . Breast cancer Paternal Aunt 82  . Migraines Brother     ROS: Review of Systems  Constitutional: Negative for weight loss.  Gastrointestinal: Negative for nausea and vomiting.  Musculoskeletal:       Negative for muscle weakness  Endo/Heme/Allergies:       Negative for polyphagia    PHYSICAL EXAM: Blood pressure (!) 114/49, pulse 78, temperature 97.9 F (36.6 C), temperature source Oral, height 5\' 5"  (1.651 m), weight 257 lb (116.6 kg), last menstrual period 10/14/2010, SpO2 95 %. Body mass index is 42.77 kg/m. Physical Exam Vitals signs reviewed.  Constitutional:      Appearance: Normal appearance. She is well-developed. She is obese.  Cardiovascular:     Rate and Rhythm: Normal rate.  Pulmonary:     Effort: Pulmonary effort is normal.  Musculoskeletal: Normal range of motion.  Skin:    General: Skin is warm and dry.  Neurological:     Mental Status: She is alert and oriented to person, place, and time.  Psychiatric:        Mood and Affect: Mood normal.        Behavior: Behavior normal.     RECENT LABS AND TESTS: BMET    Component Value Date/Time   NA 141 02/24/2018 1019   K 4.6 02/24/2018 1019   CL 105 02/24/2018 1019   CO2 20 02/24/2018 1019   GLUCOSE 67 02/24/2018 1019   GLUCOSE 89 04/15/2015 1512   BUN 10 02/24/2018 1019   CREATININE 0.78 02/24/2018 1019   CREATININE 0.85 04/15/2015 1512   CALCIUM 9.4 02/24/2018 1019   GFRNONAA 84 02/24/2018 1019   GFRAA 97 02/24/2018 1019   Lab Results  Component Value Date   HGBA1C 5.8 (H) 02/24/2018   Lab Results  Component Value Date   INSULIN 14.1 02/24/2018   CBC    Component Value Date/Time  WBC CANCELED 10/18/2017 1449   WBC 9.6 04/15/2015 1512   RBC 4.66 04/15/2015 1512   HGB 13.6 04/15/2015 1512   HGB 14.1  12/22/2013 1605   HCT 41.8 04/15/2015 1512   PLT 319 04/15/2015 1512   MCV 89.7 04/15/2015 1512   MCH 29.2 04/15/2015 1512   MCHC 32.5 04/15/2015 1512   RDW 14.0 04/15/2015 1512   LYMPHSABS 2.3 08/02/2009 1442   MONOABS 0.6 08/02/2009 1442   EOSABS 0.2 08/02/2009 1442   BASOSABS 0.0 08/02/2009 1442   Iron/TIBC/Ferritin/ %Sat No results found for: IRON, TIBC, FERRITIN, IRONPCTSAT Lipid Panel     Component Value Date/Time   CHOL 190 10/18/2017 1449   TRIG 148 10/18/2017 1449   HDL 45 10/18/2017 1449   CHOLHDL 4.2 10/18/2017 1449   CHOLHDL 4.1 04/15/2015 1512   VLDL 36 (H) 04/15/2015 1512   LDLCALC 115 (H) 10/18/2017 1449   Hepatic Function Panel     Component Value Date/Time   PROT 6.9 02/24/2018 1019   ALBUMIN 4.0 02/24/2018 1019   AST 24 02/24/2018 1019   ALT 30 02/24/2018 1019   ALKPHOS 145 (H) 02/24/2018 1019   BILITOT 0.3 02/24/2018 1019      Component Value Date/Time   TSH 3.670 10/18/2017 1449   TSH 3.41 04/15/2015 1512   TSH 3.064 11/20/2012 1544   Results for Tyer, Metta L "[name]" (MRN [id number]) as of 04/24/2018 12:50  Ref. Range 02/24/2018 10:19  Vitamin D, 25-Hydroxy Latest Ref Range: 30.0 - 100.0 ng/mL 27.9 (L)    OBESITY BEHAVIORAL INTERVENTION VISIT  Today's visit was # 5   Starting weight: 273 lbs Starting date: 02/24/2018 Today's weight : 257 lbs Today's date: 04/24/2018 Total lbs lost to date: 16    04/24/2018  Height 5\' 5"  (1.651 m)  Weight 257 lb (116.6 kg)  BMI (Calculated) 42.77  BLOOD PRESSURE - SYSTOLIC 633  BLOOD PRESSURE - DIASTOLIC 49   Body Fat % 35.4 %  Total Body Water (lbs) 91.6 lbs    ASK: We discussed the diagnosis of obesity with Verdell Carmine Worthey today and Alesana agreed to give [place] permission to discuss obesity behavioral modification therapy today.  ASSESS: Tyjanae has the diagnosis of obesity and her BMI today is 42.77 Ahlivia is in the action stage of change   ADVISE: Lee was educated on the multiple health  risks of obesity as well as the benefit of weight loss to improve her health. She was advised of the need for long term treatment and the importance of lifestyle modifications to improve her current health and to decrease her risk of future health problems.  AGREE: Multiple dietary modification options and treatment options were discussed and  Amylia agreed to follow the recommendations documented in the above note.  ARRANGE: Augustine was educated on the importance of frequent visits to treat obesity as outlined per CMS and USPSTF guidelines and agreed to schedule her next follow up appointment today.  Corey Skains, am acting as Location manager for [employer]. Owens Shark, DO  I have reviewed the above documentation for accuracy and completeness, and I agree with the above. -Jearld Lesch, DO

## 2018-04-28 ENCOUNTER — Encounter (INDEPENDENT_AMBULATORY_CARE_PROVIDER_SITE_OTHER): Payer: Self-pay | Admitting: Bariatrics

## 2018-05-05 ENCOUNTER — Ambulatory Visit: Payer: Self-pay

## 2018-05-07 ENCOUNTER — Encounter (INDEPENDENT_AMBULATORY_CARE_PROVIDER_SITE_OTHER): Payer: Self-pay

## 2018-05-13 ENCOUNTER — Ambulatory Visit (INDEPENDENT_AMBULATORY_CARE_PROVIDER_SITE_OTHER): Payer: BC Managed Care – PPO | Admitting: Bariatrics

## 2018-05-13 ENCOUNTER — Other Ambulatory Visit: Payer: Self-pay

## 2018-05-13 ENCOUNTER — Encounter (INDEPENDENT_AMBULATORY_CARE_PROVIDER_SITE_OTHER): Payer: Self-pay | Admitting: Bariatrics

## 2018-05-13 DIAGNOSIS — E559 Vitamin D deficiency, unspecified: Secondary | ICD-10-CM | POA: Diagnosis not present

## 2018-05-13 DIAGNOSIS — Z6841 Body Mass Index (BMI) 40.0 and over, adult: Secondary | ICD-10-CM

## 2018-05-13 DIAGNOSIS — R7303 Prediabetes: Secondary | ICD-10-CM | POA: Diagnosis not present

## 2018-05-13 MED ORDER — VITAMIN D3 1.25 MG (50000 UT) PO CAPS: 1.0000 | ORAL_CAPSULE | ORAL | 0 refills | Status: DC

## 2018-05-14 NOTE — Progress Notes (Signed)
Office: [phone]  /  Fax: [phone] TeleHealth Visit:  Savannah [name] has consented to this TeleHealth visit today via Webex. The patient is located at home, the provider is located at the [facility] office. The participants in this visit include the listed provider and patient. Time spent on visit was 25 minutes.  HPI:   Chief Complaint: OBESITY Savannah [name] is here to discuss her progress with her obesity treatment plan. She is on the Category 3 plan and is following her eating plan approximately 75 to 80 % of the time. She states she is exercising 0 minutes 0 times per week. Savannah [name] has lost 3 pounds per her scale at home. She is doing ok, except for getting the bread. She is denying stress.  We were unable to weigh the patient today for this TeleHealth visit.She feels as if she has lost weight since her last visit. She has lost 16 lbs since starting treatment with [place].  Vitamin D Deficiency Savannah [name] has a diagnosis of vitamin D deficiency. She is currently on vit D and her last level was 27.9 on 02/24/18. Maresa denies nausea, vomiting, or muscle weakness.  Pre-Diabetes Savannah [name] has a diagnosis of pre-diabetes based on her elevated Hgb A1c of 5.8 and her fasting Insulin was 14.1 on 02/24/18. She was informed this puts her at greater risk of developing diabetes. She is not taking medication currently and continues to work on diet and exercise to decrease risk of diabetes.   ASSESSMENT AND PLAN:  Vitamin D deficiency - Plan: Cholecalciferol (VITAMIN D3) 1.25 MG (50000 UT) CAPS  Prediabetes  Class 3 severe obesity with serious comorbidity and body mass index (BMI) of 40.0 to 44.9 in adult, unspecified obesity type ([place])  PLAN:  Vitamin D Deficiency Savannah [name] was informed that low vitamin D levels contribute to fatigue and are associated with obesity, breast, and colon cancer. Savannah [name] agrees to continue to take prescription Vit D3 @50 ,000 IU every week #4 with no refills  and will follow up for routine testing of vitamin D, at least 2-3 times per year. She was informed of the risk of over-replacement of vitamin D and agrees to not increase her dose unless she discusses this with [place] first. Savannah [name] agrees to follow up in 2 weeks as directed.  Pre-Diabetes Savannah [name] will continue to work on weight loss, exercise, and decreasing simple carbohydrates in her diet to help decrease the risk of diabetes. She was informed that eating too many simple carbohydrates or too many calories at one sitting increases the likelihood of GI side effects. Savannah [name] agreed to continue decreasing carbohydrates and increasing protein in her diet. Savannah [name] agreed to follow up with [place] as directed to monitor her progress in 2 weeks.  Obesity Savannah [name] is currently in the action stage of change. As such, her goal is to continue with weight loss efforts. She has agreed to follow the Category 3 plan. Savannah [name] has been instructed to work up to a goal of 150 minutes of combined cardio and strengthening exercise per week for weight loss and overall health benefits. We discussed the following Behavioral Modification Strategies today: increasing lean protein intake, decreasing simple carbohydrates, increasing vegetables, decrease eating out, increase H20 intake, no skipping meals, keeping healthy foods in the home, and work on meal planning and easy cooking plans.  Savannah [name] has agreed to follow up with our clinic in 2 weeks. She was informed of the importance of frequent follow up visits to maximize her success with intensive lifestyle  modifications for her multiple health conditions.  ALLERGIES: Allergies  Allergen Reactions  . Dust Mite Extract Other (See Comments)    Environmental allergies    MEDICATIONS: Current Outpatient Medications on File Prior to Visit  Medication Sig Dispense Refill  . acetaminophen (TYLENOL) 500 MG tablet Take 500 mg by mouth as directed.    . calcium carbonate (OSCAL) 1500  (600 Ca) MG TABS tablet Take by mouth 2 (two) times daily with a meal.    . CALCIUM PO Take 1 tablet by mouth daily.     . carvedilol (COREG CR) 10 MG 24 hr capsule Take 10 mg by mouth daily.    . carvedilol (COREG) 3.125 MG tablet Take 1 tablet (3.125 mg total) by mouth 2 (two) times daily. 180 tablet 3  . clobetasol cream (TEMOVATE) 0.05 % APPLY A PEA SIZED AMOUNT   TOPICALLY TWO TIMES A DAY  FOR 1-2 WEEKS -NOT FOR    DAILY LONG TERM USE 60 g 0  . cyclobenzaprine (FLEXERIL) 10 MG tablet Take 1 tablet (10 mg total) by mouth 3 (three) times daily as needed for muscle spasms. 30 tablet 1  . diclofenac sodium (VOLTAREN) 1 % GEL Apply topically 4 (four) times daily.    [name] eletriptan (RELPAX) 40 MG tablet Take 1 tablet (40 mg total) by mouth 2 (two) times daily as needed for migraine or headache. 10 tablet 5  . glucosamine-chondroitin 500-400 MG tablet Take 1 tablet by mouth 3 (three) times daily.    [name] HYDROcodone-acetaminophen (NORCO/VICODIN) 5-325 MG tablet Take 1 tablet by mouth as directed.  0  . hyoscyamine (LEVSIN, ANASPAZ) 0.125 MG tablet Take 1 tablet (0.125 mg total) by mouth every 4 (four) hours as needed. 30 tablet 1  . lisinopril (PRINIVIL,ZESTRIL) 10 MG tablet Take 1 tablet (10 mg total) by mouth daily. 90 tablet 3  . methocarbamol (ROBAXIN) 750 MG tablet Take 750 mg by mouth as directed.     . multivitamin-lutein (OCUVITE-LUTEIN) CAPS capsule Take 1 capsule by mouth daily.    . Omega-3 Fatty Acids (FISH OIL PO) Take 1 tablet by mouth daily.     [name] PARoxetine (PAXIL) 10 MG tablet Take 1 tablet (10 mg total) by mouth every morning. 90 tablet 1  . Potassium 99 MG TABS Take by mouth.    . venlafaxine XR (EFFEXOR-XR) 75 MG 24 hr capsule TAKE 1 CAPSULE DAILY WITH  BREAKFAST 90 capsule 1   No current facility-administered medications on file prior to visit.     PAST MEDICAL HISTORY: Past Medical History:  Diagnosis Date  . Arthritis    bilateral knees, recent LCL sprain-on left knee  .  CHF (congestive heart failure) ([place])   . Environmental allergies   . Fatty liver   . Headache(784.0)    migraines-on meds for control, relpax, ketoprophen, vicoden, muscle relaxer  . Hearing loss 2016   High frequency hearing - Bilateral   . Hot flashes   . Joint pain   . Lichen sclerosus   . Lipoma    Right Knee  . Menorrhagia   . Migraines   . Necrobiosis lipoidica   . Osteoarthritis   . Overweight   . Pelvic pain in female   . PONV (postoperative nausea and vomiting)    ponv, slow to awake  . Seasonal allergies   . Sinus problem   . Sprain 10/25/2010   left knee  . Trigeminal pulse   . Trigger finger, right    Middle finger and  right wrist   . Vitamin D deficiency     PAST SURGICAL HISTORY: Past Surgical History:  Procedure Laterality Date  . APPENDECTOMY    . DIAGNOSTIC LAPAROSCOPY  08/2006  . DILATION AND CURETTAGE OF UTERUS  2011   attempted ablation  . FUNCTIONAL ENDOSCOPIC SINUS SURGERY    . ganglion cyst removal    . HYSTEROSCOPY     failed  . LASIK    . LIPOMA EXCISION Right 07/22/2014   Procedure: EXCISION RIGHT THIGH LIPOMA;  Surgeon: Erroll Luna, MD;  Location: [facility];  Service: General;  Laterality: Right;  . ROBOTIC ASSISTED TOTAL HYSTERECTOMY  11/06/10   TLH/RSO  . SALPINGOOPHORECTOMY  11/06/2010   Procedure: SALPINGO OOPHERECTOMY;  Surgeon: Felipa Emory;  Location: [facility];  Service: Gynecology;  Laterality: Right;    SOCIAL HISTORY: Social History   Tobacco Use  . Smoking status: Never Smoker  . Smokeless tobacco: Never Used  Substance Use Topics  . Alcohol use: No    Alcohol/week: 0.0 standard drinks  . Drug use: No    FAMILY HISTORY: Family History  Problem Relation Age of Onset  . Skin cancer Mother   . Migraines Mother   . High blood pressure Mother   . Heart disease Father   . High blood pressure Father   . High Cholesterol Father   . Breast cancer Maternal Aunt 70  . Breast cancer Paternal Aunt 51  .  Migraines Brother     ROS: Review of Systems  Gastrointestinal: Negative for nausea and vomiting.  Musculoskeletal:       Negative for muscle weakness.    PHYSICAL EXAM: Pt in no acute distress  RECENT LABS AND TESTS: BMET    Component Value Date/Time   NA 141 02/24/2018 1019   K 4.6 02/24/2018 1019   CL 105 02/24/2018 1019   CO2 20 02/24/2018 1019   GLUCOSE 67 02/24/2018 1019   GLUCOSE 89 04/15/2015 1512   BUN 10 02/24/2018 1019   CREATININE 0.78 02/24/2018 1019   CREATININE 0.85 04/15/2015 1512   CALCIUM 9.4 02/24/2018 1019   GFRNONAA 84 02/24/2018 1019   GFRAA 97 02/24/2018 1019   Lab Results  Component Value Date   HGBA1C 5.8 (H) 02/24/2018   Lab Results  Component Value Date   INSULIN 14.1 02/24/2018   CBC    Component Value Date/Time   WBC CANCELED 10/18/2017 1449   WBC 9.6 04/15/2015 1512   RBC 4.66 04/15/2015 1512   HGB 13.6 04/15/2015 1512   HGB 14.1 12/22/2013 1605   HCT 41.8 04/15/2015 1512   PLT 319 04/15/2015 1512   MCV 89.7 04/15/2015 1512   MCH 29.2 04/15/2015 1512   MCHC 32.5 04/15/2015 1512   RDW 14.0 04/15/2015 1512   LYMPHSABS 2.3 08/02/2009 1442   MONOABS 0.6 08/02/2009 1442   EOSABS 0.2 08/02/2009 1442   BASOSABS 0.0 08/02/2009 1442   Iron/TIBC/Ferritin/ %Sat No results found for: IRON, TIBC, FERRITIN, IRONPCTSAT Lipid Panel     Component Value Date/Time   CHOL 190 10/18/2017 1449   TRIG 148 10/18/2017 1449   HDL 45 10/18/2017 1449   CHOLHDL 4.2 10/18/2017 1449   CHOLHDL 4.1 04/15/2015 1512   VLDL 36 (H) 04/15/2015 1512   LDLCALC 115 (H) 10/18/2017 1449   Hepatic Function Panel     Component Value Date/Time   PROT 6.9 02/24/2018 1019   ALBUMIN 4.0 02/24/2018 1019   AST 24 02/24/2018 1019   ALT 30 02/24/2018  1019   ALKPHOS 145 (H) 02/24/2018 1019   BILITOT 0.3 02/24/2018 1019      Component Value Date/Time   TSH 3.670 10/18/2017 1449   TSH 3.41 04/15/2015 1512   TSH 3.064 11/20/2012 1544   Results for Loiseau,  Analyce L "[name]" (MRN [id number]) as of 05/14/2018 08:54  Ref. Range 02/24/2018 10:19  Vitamin D, 25-Hydroxy Latest Ref Range: 30.0 - 100.0 ng/mL 27.9 (L)     I, Marcille Blanco, CMA, am acting as Location manager for [employer]. Owens Shark, DO.   I have reviewed the above documentation for accuracy and completeness, and I agree with the above. -Jearld Lesch, DO

## 2018-05-18 ENCOUNTER — Encounter: Payer: Self-pay | Admitting: Obstetrics & Gynecology

## 2018-05-19 ENCOUNTER — Telehealth: Payer: Self-pay | Admitting: *Deleted

## 2018-05-19 NOTE — Telephone Encounter (Signed)
Spoke with patient. Has been on Paxil 10 mg daily for approximately 1 yr for vasomotor symptoms, does not feel dosage is effective now for hot flashes. Reports increase in hot flashes and having difficulty sleeping, requesting recommendations. Denies any other GYN symptoms. Recommended OV with covering provider, patient request WeEx visit. WebEx visit scheduled with Dr. Quincy Simmonds on 4/8/ at 9:30am.   Routing to provider for final review. Patient is agreeable to disposition. Will close encounter.

## 2018-05-19 NOTE — Telephone Encounter (Signed)
My hot flashes are getting bad again. I'm still taking the generic paxil. Any thoughts?

## 2018-05-21 ENCOUNTER — Ambulatory Visit (INDEPENDENT_AMBULATORY_CARE_PROVIDER_SITE_OTHER): Payer: BC Managed Care – PPO | Admitting: Obstetrics and Gynecology

## 2018-05-21 ENCOUNTER — Encounter: Payer: Self-pay | Admitting: Obstetrics and Gynecology

## 2018-05-21 ENCOUNTER — Other Ambulatory Visit: Payer: Self-pay

## 2018-05-21 DIAGNOSIS — N951 Menopausal and female climacteric states: Secondary | ICD-10-CM

## 2018-05-21 NOTE — Progress Notes (Signed)
GYNECOLOGY  VISIT   HPI: 58 y.o.   Married  Caucasian  female   [id number] with Patient's last menstrual period was 10/14/2010.   Webex for hot flashes.      Consent for WebEx consultation.  Lamont Snowball, set up the consultation.  Consultation started at 9:38 and ended at 10:08 am. She is at home and I am in the office.   States she is having hot flashes during the day with activity.  These are followed then by chills.  She is so warm that she feels nausea.  Denies chest pain or shortness of breath.  No night sweats.   On Paxil 10 mg for years for hot flashes.  Denies depression.   She takes Effexor XR 75 mg daily for migraine headaches. She started 9 months ago on 37.5 mg and has titrated up to current dosage.   Took Neurontin in the past for post herpetic neuralgia from shingles. States she suffered with benign positional vertigo after she started taking this medication.   Reports about 25 pounds of weight los over the last 3 1/2 to 4 months.   TSH normal in Sept 2019.   GYNECOLOGIC HISTORY: Patient's last menstrual period was 10/14/2010. Contraception:  Hysterectomy  Menopausal hormone therapy:  none Last mammogram:  04/17/17 BIRADS1:Neg. Has appt 07/16/18 Last pap smear:   2012 Neg         OB History    Gravida  0   Para  0   Term  0   Preterm  0   AB  0   Living  0     SAB  0   TAB  0   Ectopic  0   Multiple  0   Live Births                 Patient Active Problem List   Diagnosis Date Noted  . Vitamin D deficiency 03/26/2018  . Prediabetes 03/11/2018  . Class 3 severe obesity with serious comorbidity and body mass index (BMI) of 40.0 to 44.9 in adult ([place]) 02/26/2018  . Migraines 10/01/2016  . Decreased cardiac ejection fraction 10/01/2016  . BMI 45.0-49.9, adult ([place]) 10/01/2016  . Shingles 08/03/2016  . Chronic pain of right knee 05/24/2016  . Unilateral primary osteoarthritis, right knee 05/24/2016  . Posterior tibial tendinitis, right  leg 01/30/2016  . Lichen sclerosus 83/41/9622    Past Medical History:  Diagnosis Date  . Arthritis    bilateral knees, recent LCL sprain-on left knee  . CHF (congestive heart failure) ([place])   . Environmental allergies   . Fatty liver   . Headache(784.0)    migraines-on meds for control, relpax, ketoprophen, vicoden, muscle relaxer  . Hearing loss 2016   High frequency hearing - Bilateral   . Hot flashes   . Joint pain   . Lichen sclerosus   . Lipoma    Right Knee  . Menorrhagia   . Migraines   . Necrobiosis lipoidica   . Osteoarthritis   . Overweight   . Pelvic pain in female   . PONV (postoperative nausea and vomiting)    ponv, slow to awake  . Seasonal allergies   . Sinus problem   . Sprain 10/25/2010   left knee  . Trigeminal pulse   . Trigger finger, right    Middle finger and right wrist   . Vitamin D deficiency     Past Surgical History:  Procedure Laterality Date  . APPENDECTOMY    .  DIAGNOSTIC LAPAROSCOPY  08/2006  . DILATION AND CURETTAGE OF UTERUS  2011   attempted ablation  . FUNCTIONAL ENDOSCOPIC SINUS SURGERY    . ganglion cyst removal    . HYSTEROSCOPY     failed  . LASIK    . LIPOMA EXCISION Right 07/22/2014   Procedure: EXCISION RIGHT THIGH LIPOMA;  Surgeon: Erroll Luna, MD;  Location: [facility];  Service: General;  Laterality: Right;  . ROBOTIC ASSISTED TOTAL HYSTERECTOMY  11/06/10   TLH/RSO  . SALPINGOOPHORECTOMY  11/06/2010   Procedure: SALPINGO OOPHERECTOMY;  Surgeon: Felipa Emory;  Location: [facility];  Service: Gynecology;  Laterality: Right;    Current Outpatient Medications  Medication Sig Dispense Refill  . acetaminophen (TYLENOL) 500 MG tablet Take 500 mg by mouth as directed.    . calcium carbonate (OSCAL) 1500 (600 Ca) MG TABS tablet Take by mouth 2 (two) times daily with a meal.    . CALCIUM PO Take 1 tablet by mouth daily.     . carvedilol (COREG CR) 10 MG 24 hr capsule Take 10 mg by mouth daily.    .  carvedilol (COREG) 3.125 MG tablet Take 1 tablet (3.125 mg total) by mouth 2 (two) times daily. 180 tablet 3  . Cholecalciferol (VITAMIN D3) 1.25 MG (50000 UT) CAPS Take 1 Dose by mouth once a week. 4 capsule 0  . clobetasol cream (TEMOVATE) 0.05 % APPLY A PEA SIZED AMOUNT   TOPICALLY TWO TIMES A DAY  FOR 1-2 WEEKS -NOT FOR    DAILY LONG TERM USE 60 g 0  . cyclobenzaprine (FLEXERIL) 10 MG tablet Take 1 tablet (10 mg total) by mouth 3 (three) times daily as needed for muscle spasms. 30 tablet 1  . diclofenac sodium (VOLTAREN) 1 % GEL Apply topically 4 (four) times daily.    [name] eletriptan (RELPAX) 40 MG tablet Take 1 tablet (40 mg total) by mouth 2 (two) times daily as needed for migraine or headache. 10 tablet 5  . glucosamine-chondroitin 500-400 MG tablet Take 1 tablet by mouth 3 (three) times daily.    [name] HYDROcodone-acetaminophen (NORCO/VICODIN) 5-325 MG tablet Take 1 tablet by mouth as directed.  0  . hyoscyamine (LEVSIN, ANASPAZ) 0.125 MG tablet Take 1 tablet (0.125 mg total) by mouth every 4 (four) hours as needed. 30 tablet 1  . lisinopril (PRINIVIL,ZESTRIL) 10 MG tablet Take 1 tablet (10 mg total) by mouth daily. 90 tablet 3  . methocarbamol (ROBAXIN) 750 MG tablet Take 750 mg by mouth as directed.     . multivitamin-lutein (OCUVITE-LUTEIN) CAPS capsule Take 1 capsule by mouth daily.    . Omega-3 Fatty Acids (FISH OIL PO) Take 1 tablet by mouth daily.     [name] PARoxetine (PAXIL) 10 MG tablet Take 1 tablet (10 mg total) by mouth every morning. 90 tablet 1  . Potassium 99 MG TABS Take by mouth.    . venlafaxine XR (EFFEXOR-XR) 75 MG 24 hr capsule TAKE 1 CAPSULE DAILY WITH  BREAKFAST 90 capsule 1   No current facility-administered medications for this visit.      ALLERGIES: Dust mite extract  Family History  Problem Relation Age of Onset  . Skin cancer Mother   . Migraines Mother   . High blood pressure Mother   . Heart disease Father   . High blood pressure Father   . High Cholesterol  Father   . Breast cancer Maternal Aunt 70  . Breast cancer Paternal Aunt 27  . Migraines  Brother     Social History   Socioeconomic History  . Marital status: Married    Spouse name: Lanie Schelling  . Number of children: 0  . Years of education: 80  . Highest education level: Not on file  Occupational History  . Occupation: Administrator, [employer]  Social Needs  . Financial resource strain: Not on file  . Food insecurity:    Worry: Not on file    Inability: Not on file  . Transportation needs:    Medical: Not on file    Non-medical: Not on file  Tobacco Use  . Smoking status: Never Smoker  . Smokeless tobacco: Never Used  Substance and Sexual Activity  . Alcohol use: No    Alcohol/week: 0.0 standard drinks  . Drug use: No  . Sexual activity: Yes    Birth control/protection: Surgical    Comment: TLH/RSO  Lifestyle  . Physical activity:    Days per week: Not on file    Minutes per session: Not on file  . Stress: Not on file  Relationships  . Social connections:    Talks on phone: Not on file    Gets together: Not on file    Attends religious service: Not on file    Active member of club or organization: Not on file    Attends meetings of clubs or organizations: Not on file    Relationship status: Not on file  . Intimate partner violence:    Fear of current or ex partner: Not on file    Emotionally abused: Not on file    Physically abused: Not on file    Forced sexual activity: Not on file  Other Topics Concern  . Not on file  Social History Narrative   Lives w/ husband, Eddie Dibbles   Caffeine use: Diet coke girl   Right handed     Review of Systems  All other systems reviewed and are negative.   PHYSICAL EXAMINATION:    LMP 10/14/2010       NA.  ASSESSMENT  Menopausal symptoms.   Currently on Paxil and Effexor XR. Successful weight loss.  Hx CHF.   PLAN  We discussed menopausal symptoms and treatment options - increase Paxil dosage, increase Effexor  dosage, Neurontin, Clonidine patch, herbal therapy Hoy Register).  Risks and benefits reviewed of each.  She will try Estroven, and if this is not successful, will plan for Paxil increase to 20 mg daily.  I encouraged her to continue her weight loss journey.  FU for annual exam and prn.    An After Visit Summary was printed and given to the patient.  __30____ minutes consultation time.

## 2018-05-27 ENCOUNTER — Other Ambulatory Visit: Payer: Self-pay

## 2018-05-27 ENCOUNTER — Ambulatory Visit (INDEPENDENT_AMBULATORY_CARE_PROVIDER_SITE_OTHER): Payer: BC Managed Care – PPO | Admitting: Bariatrics

## 2018-05-27 ENCOUNTER — Encounter (INDEPENDENT_AMBULATORY_CARE_PROVIDER_SITE_OTHER): Payer: Self-pay | Admitting: Bariatrics

## 2018-05-27 DIAGNOSIS — R7303 Prediabetes: Secondary | ICD-10-CM

## 2018-05-27 DIAGNOSIS — Z6841 Body Mass Index (BMI) 40.0 and over, adult: Secondary | ICD-10-CM | POA: Diagnosis not present

## 2018-05-27 DIAGNOSIS — E559 Vitamin D deficiency, unspecified: Secondary | ICD-10-CM

## 2018-05-27 MED ORDER — VITAMIN D (ERGOCALCIFEROL) 1.25 MG (50000 UNIT) PO CAPS: 50000.0000 [IU] | ORAL_CAPSULE | ORAL | 0 refills | Status: DC

## 2018-05-27 NOTE — Progress Notes (Signed)
Office: [phone]  /  Fax: [phone] TeleHealth Visit:  Savannah [name] has verbally consented to this TeleHealth visit today. The patient is located at home, the provider is located at the [facility] office. The participants in this visit include the listed provider and patient and any and all parties involved. The visit was conducted today via WebEx.  HPI:   Chief Complaint: OBESITY Savannah [name] is here to discuss her progress with her obesity treatment plan. She is on the Category 3 plan and is following her eating plan approximately 75 to 80 % of the time. She states she is exercising 0 minutes 0 times per week. Savannah [name] has lost 3 pounds. She is doing well.  She is working from home. Savannah [name] is not doing any stress eating. We were unable to weigh the patient today for this TeleHealth visit. She feels as if she has lost weight since her last visit. She has lost 19 lbs since starting treatment with [place].  Vitamin D deficiency Savannah [name] has a diagnosis of vitamin D deficiency. Her last vitamin D level was at 27.9 She is currently taking vit D and denies nausea, vomiting or muscle weakness.  Pre-Diabetes Savannah [name] has a diagnosis of prediabetes based on her elevated Hgb A1c and was informed this puts her at greater risk of developing diabetes. Her last A1c was at 5.8 and last insulin level was at 14.1 Savannah [name] is not on medications. She continues to work on diet and exercise to decrease risk of diabetes. She denies nausea or hypoglycemia.  ASSESSMENT AND PLAN:  Vitamin D deficiency - Plan: Vitamin D, Ergocalciferol, (DRISDOL) 1.25 MG (50000 UT) CAPS capsule  Prediabetes  Class 3 severe obesity with serious comorbidity and body mass index (BMI) of 40.0 to 44.9 in adult, unspecified obesity type (Savannah [name])  PLAN:  Vitamin D Deficiency Savannah [name] was informed that low vitamin D levels contributes to fatigue and are associated with obesity, breast, and colon cancer. She agrees to  continue to take prescription Vit D3 (Cholecalciferol) @50 ,000 IU weekly #4 with no refills and will follow up for routine testing of vitamin D, at least 2-3 times per year. She was informed of the risk of over-replacement of vitamin D and agrees to not increase her dose unless she discusses this with [place] first.  Pre-Diabetes Savannah [name] will continue to work on weight loss, exercise, increasing lean protein and decreasing simple carbohydrates in her diet to help decrease the risk of diabetes. She was informed that eating too many simple carbohydrates or too many calories at one sitting increases the likelihood of GI side effects. Savannah [name] agreed to follow up with [place] as directed to monitor her progress.  Obesity Savannah [name] is currently in the action stage of change. As such, her goal is to continue with weight loss efforts She has agreed to follow the Category 3 plan Savannah [name] has been instructed to work up to a goal of 150 minutes of combined cardio and strengthening exercise per week for weight loss and overall health benefits. We discussed the following Behavioral Modification Strategies today: increase H2O intake, no skipping meals, keeping healthy foods in the home, increasing lean protein intake, decreasing simple carbohydrates, increasing vegetables, decrease eating out and work on meal planning and easy cooking plans Savannah [name] will weigh herself at home once weekly.  Savannah [name] has agreed to follow up with our clinic in 2 weeks. She was informed of the importance of frequent follow up visits to maximize her success with intensive lifestyle modifications for  her multiple health conditions.  ALLERGIES: Allergies  Allergen Reactions  . Dust Mite Extract Other (See Comments)    Environmental allergies    MEDICATIONS: Current Outpatient Medications on File Prior to Visit  Medication Sig Dispense Refill  . acetaminophen (TYLENOL) 500 MG tablet Take 500 mg by mouth as directed.    . calcium carbonate  (OSCAL) 1500 (600 Ca) MG TABS tablet Take by mouth 2 (two) times daily with a meal.    . CALCIUM PO Take 1 tablet by mouth daily.     . carvedilol (COREG CR) 10 MG 24 hr capsule Take 10 mg by mouth daily.    . carvedilol (COREG) 3.125 MG tablet Take 1 tablet (3.125 mg total) by mouth 2 (two) times daily. 180 tablet 3  . Cholecalciferol (VITAMIN D3) 1.25 MG (50000 UT) CAPS Take 1 Dose by mouth once a week. 4 capsule 0  . clobetasol cream (TEMOVATE) 0.05 % APPLY A PEA SIZED AMOUNT   TOPICALLY TWO TIMES A DAY  FOR 1-2 WEEKS -NOT FOR    DAILY LONG TERM USE 60 g 0  . cyclobenzaprine (FLEXERIL) 10 MG tablet Take 1 tablet (10 mg total) by mouth 3 (three) times daily as needed for muscle spasms. 30 tablet 1  . diclofenac sodium (VOLTAREN) 1 % GEL Apply topically 4 (four) times daily.    [name] eletriptan (RELPAX) 40 MG tablet Take 1 tablet (40 mg total) by mouth 2 (two) times daily as needed for migraine or headache. 10 tablet 5  . glucosamine-chondroitin 500-400 MG tablet Take 1 tablet by mouth 3 (three) times daily.    [name] HYDROcodone-acetaminophen (NORCO/VICODIN) 5-325 MG tablet Take 1 tablet by mouth as directed.  0  . hyoscyamine (LEVSIN, ANASPAZ) 0.125 MG tablet Take 1 tablet (0.125 mg total) by mouth every 4 (four) hours as needed. 30 tablet 1  . lisinopril (PRINIVIL,ZESTRIL) 10 MG tablet Take 1 tablet (10 mg total) by mouth daily. 90 tablet 3  . methocarbamol (ROBAXIN) 750 MG tablet Take 750 mg by mouth as directed.     . multivitamin-lutein (OCUVITE-LUTEIN) CAPS capsule Take 1 capsule by mouth daily.    . Omega-3 Fatty Acids (FISH OIL PO) Take 1 tablet by mouth daily.     [name] PARoxetine (PAXIL) 10 MG tablet Take 1 tablet (10 mg total) by mouth every morning. 90 tablet 1  . Potassium 99 MG TABS Take by mouth.    . venlafaxine XR (EFFEXOR-XR) 75 MG 24 hr capsule TAKE 1 CAPSULE DAILY WITH  BREAKFAST 90 capsule 1   No current facility-administered medications on file prior to visit.     PAST MEDICAL  HISTORY: Past Medical History:  Diagnosis Date  . Arthritis    bilateral knees, recent LCL sprain-on left knee  . CHF (congestive heart failure) ([place])   . Environmental allergies   . Fatty liver   . Headache(784.0)    migraines-on meds for control, relpax, ketoprophen, vicoden, muscle relaxer  . Hearing loss 2016   High frequency hearing - Bilateral   . Hot flashes   . Joint pain   . Lichen sclerosus   . Lipoma    Right Knee  . Menorrhagia   . Migraines   . Necrobiosis lipoidica   . Osteoarthritis   . Overweight   . Pelvic pain in female   . PONV (postoperative nausea and vomiting)    ponv, slow to awake  . Seasonal allergies   . Sinus problem   . Sprain 10/25/2010  left knee  . Trigeminal pulse   . Trigger finger, right    Middle finger and right wrist   . Vitamin D deficiency     PAST SURGICAL HISTORY: Past Surgical History:  Procedure Laterality Date  . APPENDECTOMY    . DIAGNOSTIC LAPAROSCOPY  08/2006  . DILATION AND CURETTAGE OF UTERUS  2011   attempted ablation  . FUNCTIONAL ENDOSCOPIC SINUS SURGERY    . ganglion cyst removal    . HYSTEROSCOPY     failed  . LASIK    . LIPOMA EXCISION Right 07/22/2014   Procedure: EXCISION RIGHT THIGH LIPOMA;  Surgeon: Erroll Luna, MD;  Location: [facility];  Service: General;  Laterality: Right;  . ROBOTIC ASSISTED TOTAL HYSTERECTOMY  11/06/10   TLH/RSO  . SALPINGOOPHORECTOMY  11/06/2010   Procedure: SALPINGO OOPHERECTOMY;  Surgeon: Felipa Emory;  Location: [facility];  Service: Gynecology;  Laterality: Right;    SOCIAL HISTORY: Social History   Tobacco Use  . Smoking status: Never Smoker  . Smokeless tobacco: Never Used  Substance Use Topics  . Alcohol use: No    Alcohol/week: 0.0 standard drinks  . Drug use: No    FAMILY HISTORY: Family History  Problem Relation Age of Onset  . Skin cancer Mother   . Migraines Mother   . High blood pressure Mother   . Heart disease Father   . High blood  pressure Father   . High Cholesterol Father   . Breast cancer Maternal Aunt 70  . Breast cancer Paternal Aunt 59  . Migraines Brother     ROS: Review of Systems  Constitutional: Positive for weight loss.  Gastrointestinal: Negative for nausea and vomiting.  Musculoskeletal:       Negative for muscle weakness  Endo/Heme/Allergies:       Negative for hypoglycemia    PHYSICAL EXAM: Pt in no acute distress  RECENT LABS AND TESTS: BMET    Component Value Date/Time   NA 141 02/24/2018 1019   K 4.6 02/24/2018 1019   CL 105 02/24/2018 1019   CO2 20 02/24/2018 1019   GLUCOSE 67 02/24/2018 1019   GLUCOSE 89 04/15/2015 1512   BUN 10 02/24/2018 1019   CREATININE 0.78 02/24/2018 1019   CREATININE 0.85 04/15/2015 1512   CALCIUM 9.4 02/24/2018 1019   GFRNONAA 84 02/24/2018 1019   GFRAA 97 02/24/2018 1019   Lab Results  Component Value Date   HGBA1C 5.8 (H) 02/24/2018   Lab Results  Component Value Date   INSULIN 14.1 02/24/2018   CBC    Component Value Date/Time   WBC CANCELED 10/18/2017 1449   WBC 9.6 04/15/2015 1512   RBC 4.66 04/15/2015 1512   HGB 13.6 04/15/2015 1512   HGB 14.1 12/22/2013 1605   HCT 41.8 04/15/2015 1512   PLT 319 04/15/2015 1512   MCV 89.7 04/15/2015 1512   MCH 29.2 04/15/2015 1512   MCHC 32.5 04/15/2015 1512   RDW 14.0 04/15/2015 1512   LYMPHSABS 2.3 08/02/2009 1442   MONOABS 0.6 08/02/2009 1442   EOSABS 0.2 08/02/2009 1442   BASOSABS 0.0 08/02/2009 1442   Iron/TIBC/Ferritin/ %Sat No results found for: IRON, TIBC, FERRITIN, IRONPCTSAT Lipid Panel     Component Value Date/Time   CHOL 190 10/18/2017 1449   TRIG 148 10/18/2017 1449   HDL 45 10/18/2017 1449   CHOLHDL 4.2 10/18/2017 1449   CHOLHDL 4.1 04/15/2015 1512   VLDL 36 (H) 04/15/2015 1512   LDLCALC 115 (H) 10/18/2017 1449  Hepatic Function Panel     Component Value Date/Time   PROT 6.9 02/24/2018 1019   ALBUMIN 4.0 02/24/2018 1019   AST 24 02/24/2018 1019   ALT 30  02/24/2018 1019   ALKPHOS 145 (H) 02/24/2018 1019   BILITOT 0.3 02/24/2018 1019      Component Value Date/Time   TSH 3.670 10/18/2017 1449   TSH 3.41 04/15/2015 1512   TSH 3.064 11/20/2012 1544    Results for [place], Breon L "[name]" (MRN [id number]) as of 05/27/2018 17:01  Ref. Range 02/24/2018 10:19  Vitamin D, 25-Hydroxy Latest Ref Range: 30.0 - 100.0 ng/mL 27.9 (L)   I, Doreene Nest, am acting as Location manager for [employer]. Owens Shark, DO  I have reviewed the above documentation for accuracy and completeness, and I agree with the above. -Jearld Lesch, DO

## 2018-05-31 ENCOUNTER — Encounter (INDEPENDENT_AMBULATORY_CARE_PROVIDER_SITE_OTHER): Payer: Self-pay | Admitting: Bariatrics

## 2018-06-02 ENCOUNTER — Other Ambulatory Visit (INDEPENDENT_AMBULATORY_CARE_PROVIDER_SITE_OTHER): Payer: Self-pay

## 2018-06-02 DIAGNOSIS — E559 Vitamin D deficiency, unspecified: Secondary | ICD-10-CM

## 2018-06-02 MED ORDER — VITAMIN D3 1.25 MG (50000 UT) PO CAPS: 1.0000 | ORAL_CAPSULE | ORAL | 0 refills | Status: DC

## 2018-06-02 NOTE — Telephone Encounter (Signed)
Please advise 

## 2018-06-02 NOTE — Telephone Encounter (Signed)
Want me to send D3 in for her?

## 2018-06-09 ENCOUNTER — Ambulatory Visit: Payer: Self-pay

## 2018-06-10 ENCOUNTER — Encounter (INDEPENDENT_AMBULATORY_CARE_PROVIDER_SITE_OTHER): Payer: Self-pay | Admitting: Bariatrics

## 2018-06-10 ENCOUNTER — Other Ambulatory Visit: Payer: Self-pay

## 2018-06-10 ENCOUNTER — Ambulatory Visit (INDEPENDENT_AMBULATORY_CARE_PROVIDER_SITE_OTHER): Payer: BC Managed Care – PPO | Admitting: Bariatrics

## 2018-06-10 DIAGNOSIS — R7303 Prediabetes: Secondary | ICD-10-CM

## 2018-06-10 DIAGNOSIS — E559 Vitamin D deficiency, unspecified: Secondary | ICD-10-CM | POA: Diagnosis not present

## 2018-06-10 DIAGNOSIS — Z6841 Body Mass Index (BMI) 40.0 and over, adult: Secondary | ICD-10-CM

## 2018-06-11 NOTE — Progress Notes (Signed)
Office: [phone]  /  Fax: [phone] TeleHealth Visit:  Savannah [name] has verbally consented to this TeleHealth visit today. The patient is located at home, the provider is located at the [employer] [facility] office. The participants in this visit include the listed provider and patient and any and all parties involved. The visit was conducted today via WebEx.  HPI:   Chief Complaint: OBESITY Savannah [name] is here to discuss her progress with her obesity treatment plan. She is on the Category 3 plan and is following her eating plan approximately 60 % of the time. She states she is exercising 0 minutes 0 times per week. [name] states that she is down about 1 pound (weight 251 lbs). She is working from home. [name] is getting adequate protein. We were unable to weigh the patient today for this TeleHealth visit. She feels as if she has lost weight since her last visit. She has lost 22 lbs since starting treatment with [place].  Vitamin D deficiency [name] has a diagnosis of vitamin D deficiency. She is taking high dose vit D and denies nausea, vomiting or muscle weakness.  Pre-Diabetes Savannah [name] has a diagnosis of prediabetes based on her elevated Hgb A1c and was informed this puts her at greater risk of developing diabetes. She is not taking medications currently and continues to work on diet and exercise to decrease risk of diabetes. She denies polyphagia.  ASSESSMENT AND PLAN:  Vitamin D deficiency  Prediabetes  Class 3 severe obesity with serious comorbidity and body mass index (BMI) of 40.0 to 44.9 in adult, unspecified obesity type ([place])  PLAN:  Vitamin D Deficiency [name] was informed that low vitamin D levels contributes to fatigue and are associated with obesity, breast, and colon cancer. She agrees to continue to take prescription Vit D @50 ,000 IU every week and will follow up for routine testing of vitamin D, at least 2-3 times per year. She was informed of the risk of  over-replacement of vitamin D and agrees to not increase her dose unless she discusses this with [place] first.  Pre-Diabetes [name] will continue to work on weight loss, increasing lean protein and decreasing simple carbohydrates in her diet to help decrease the risk of diabetes. She was informed that eating too many simple carbohydrates or too many calories at one sitting increases the likelihood of GI side effects. [name] will consider exercise and she will follow up with [place] as directed to monitor her progress.  Obesity Savannah [name] is currently in the action stage of change. As such, her goal is to continue with weight loss efforts She has agreed to follow the Category 3 plan Savannah [name] will consider exercise for weight loss and overall health benefits. We discussed the following Behavioral Modification Strategies today: increase H2O intake, no skipping meals, keeping healthy foods in the home, increasing lean protein intake, decreasing simple carbohydrates, increasing vegetables, decrease eating out and work on meal planning and easy cooking plans Savannah [name] will weigh herself at home before each visit. Ideas for breakfast were given to patient today.  Savannah [name] has agreed to follow up with our clinic in 2 weeks. She was informed of the importance of frequent follow up visits to maximize her success with intensive lifestyle modifications for her multiple health conditions.  ALLERGIES: Allergies  Allergen Reactions  . Dust Mite Extract Other (See Comments)    Environmental allergies    MEDICATIONS: Current Outpatient Medications on File Prior to Visit  Medication Sig Dispense Refill  . acetaminophen (TYLENOL)  500 MG tablet Take 500 mg by mouth as directed.    . calcium carbonate (OSCAL) 1500 (600 Ca) MG TABS tablet Take by mouth 2 (two) times daily with a meal.    . CALCIUM PO Take 1 tablet by mouth daily.     . carvedilol (COREG CR) 10 MG 24 hr capsule Take 10 mg by mouth daily.    . carvedilol  (COREG) 3.125 MG tablet Take 1 tablet (3.125 mg total) by mouth 2 (two) times daily. 180 tablet 3  . Cholecalciferol (VITAMIN D3) 1.25 MG (50000 UT) CAPS Take 1 Dose by mouth once a week. 4 capsule 0  . clobetasol cream (TEMOVATE) 0.05 % APPLY A PEA SIZED AMOUNT   TOPICALLY TWO TIMES A DAY  FOR 1-2 WEEKS -NOT FOR    DAILY LONG TERM USE 60 g 0  . cyclobenzaprine (FLEXERIL) 10 MG tablet Take 1 tablet (10 mg total) by mouth 3 (three) times daily as needed for muscle spasms. 30 tablet 1  . diclofenac sodium (VOLTAREN) 1 % GEL Apply topically 4 (four) times daily.    [name] eletriptan (RELPAX) 40 MG tablet Take 1 tablet (40 mg total) by mouth 2 (two) times daily as needed for migraine or headache. 10 tablet 5  . glucosamine-chondroitin 500-400 MG tablet Take 1 tablet by mouth 3 (three) times daily.    [name] HYDROcodone-acetaminophen (NORCO/VICODIN) 5-325 MG tablet Take 1 tablet by mouth as directed.  0  . hyoscyamine (LEVSIN, ANASPAZ) 0.125 MG tablet Take 1 tablet (0.125 mg total) by mouth every 4 (four) hours as needed. 30 tablet 1  . lisinopril (PRINIVIL,ZESTRIL) 10 MG tablet Take 1 tablet (10 mg total) by mouth daily. 90 tablet 3  . methocarbamol (ROBAXIN) 750 MG tablet Take 750 mg by mouth as directed.     . multivitamin-lutein (OCUVITE-LUTEIN) CAPS capsule Take 1 capsule by mouth daily.    . Omega-3 Fatty Acids (FISH OIL PO) Take 1 tablet by mouth daily.     [name] PARoxetine (PAXIL) 10 MG tablet Take 1 tablet (10 mg total) by mouth every morning. 90 tablet 1  . Potassium 99 MG TABS Take by mouth.    . venlafaxine XR (EFFEXOR-XR) 75 MG 24 hr capsule TAKE 1 CAPSULE DAILY WITH  BREAKFAST 90 capsule 1   No current facility-administered medications on file prior to visit.     PAST MEDICAL HISTORY: Past Medical History:  Diagnosis Date  . Arthritis    bilateral knees, recent LCL sprain-on left knee  . CHF (congestive heart failure) ([place])   . Environmental allergies   . Fatty liver   . Headache(784.0)     migraines-on meds for control, relpax, ketoprophen, vicoden, muscle relaxer  . Hearing loss 2016   High frequency hearing - Bilateral   . Hot flashes   . Joint pain   . Lichen sclerosus   . Lipoma    Right Knee  . Menorrhagia   . Migraines   . Necrobiosis lipoidica   . Osteoarthritis   . Overweight   . Pelvic pain in female   . PONV (postoperative nausea and vomiting)    ponv, slow to awake  . Seasonal allergies   . Sinus problem   . Sprain 10/25/2010   left knee  . Trigeminal pulse   . Trigger finger, right    Middle finger and right wrist   . Vitamin D deficiency     PAST SURGICAL HISTORY: Past Surgical History:  Procedure Laterality Date  . APPENDECTOMY    .  DIAGNOSTIC LAPAROSCOPY  08/2006  . DILATION AND CURETTAGE OF UTERUS  2011   attempted ablation  . FUNCTIONAL ENDOSCOPIC SINUS SURGERY    . ganglion cyst removal    . HYSTEROSCOPY     failed  . LASIK    . LIPOMA EXCISION Right 07/22/2014   Procedure: EXCISION RIGHT THIGH LIPOMA;  Surgeon: Erroll Luna, MD;  Location: [facility];  Service: General;  Laterality: Right;  . ROBOTIC ASSISTED TOTAL HYSTERECTOMY  11/06/10   TLH/RSO  . SALPINGOOPHORECTOMY  11/06/2010   Procedure: SALPINGO OOPHERECTOMY;  Surgeon: Felipa Emory;  Location: [facility];  Service: Gynecology;  Laterality: Right;    SOCIAL HISTORY: Social History   Tobacco Use  . Smoking status: Never Smoker  . Smokeless tobacco: Never Used  Substance Use Topics  . Alcohol use: No    Alcohol/week: 0.0 standard drinks  . Drug use: No    FAMILY HISTORY: Family History  Problem Relation Age of Onset  . Skin cancer Mother   . Migraines Mother   . High blood pressure Mother   . Heart disease Father   . High blood pressure Father   . High Cholesterol Father   . Breast cancer Maternal Aunt 70  . Breast cancer Paternal Aunt 79  . Migraines Brother     ROS: Review of Systems  Constitutional: Positive for weight loss.   Gastrointestinal: Negative for nausea and vomiting.  Musculoskeletal:       Negative for muscle weakness    PHYSICAL EXAM: Pt in no acute distress  RECENT LABS AND TESTS: BMET    Component Value Date/Time   NA 141 02/24/2018 1019   K 4.6 02/24/2018 1019   CL 105 02/24/2018 1019   CO2 20 02/24/2018 1019   GLUCOSE 67 02/24/2018 1019   GLUCOSE 89 04/15/2015 1512   BUN 10 02/24/2018 1019   CREATININE 0.78 02/24/2018 1019   CREATININE 0.85 04/15/2015 1512   CALCIUM 9.4 02/24/2018 1019   GFRNONAA 84 02/24/2018 1019   GFRAA 97 02/24/2018 1019   Lab Results  Component Value Date   HGBA1C 5.8 (H) 02/24/2018   Lab Results  Component Value Date   INSULIN 14.1 02/24/2018   CBC    Component Value Date/Time   WBC CANCELED 10/18/2017 1449   WBC 9.6 04/15/2015 1512   RBC 4.66 04/15/2015 1512   HGB 13.6 04/15/2015 1512   HGB 14.1 12/22/2013 1605   HCT 41.8 04/15/2015 1512   PLT 319 04/15/2015 1512   MCV 89.7 04/15/2015 1512   MCH 29.2 04/15/2015 1512   MCHC 32.5 04/15/2015 1512   RDW 14.0 04/15/2015 1512   LYMPHSABS 2.3 08/02/2009 1442   MONOABS 0.6 08/02/2009 1442   EOSABS 0.2 08/02/2009 1442   BASOSABS 0.0 08/02/2009 1442   Iron/TIBC/Ferritin/ %Sat No results found for: IRON, TIBC, FERRITIN, IRONPCTSAT Lipid Panel     Component Value Date/Time   CHOL 190 10/18/2017 1449   TRIG 148 10/18/2017 1449   HDL 45 10/18/2017 1449   CHOLHDL 4.2 10/18/2017 1449   CHOLHDL 4.1 04/15/2015 1512   VLDL 36 (H) 04/15/2015 1512   LDLCALC 115 (H) 10/18/2017 1449   Hepatic Function Panel     Component Value Date/Time   PROT 6.9 02/24/2018 1019   ALBUMIN 4.0 02/24/2018 1019   AST 24 02/24/2018 1019   ALT 30 02/24/2018 1019   ALKPHOS 145 (H) 02/24/2018 1019   BILITOT 0.3 02/24/2018 1019      Component Value Date/Time  TSH 3.670 10/18/2017 1449   TSH 3.41 04/15/2015 1512   TSH 3.064 11/20/2012 1544     Ref. Range 02/24/2018 10:19  Vitamin D, 25-Hydroxy Latest Ref Range:  30.0 - 100.0 ng/mL 27.9 (L)    I, Doreene Nest, am acting as Location manager for [employer]. Owens Shark, DO  I have reviewed the above documentation for accuracy and completeness, and I agree with the above. -Jearld Lesch, DO

## 2018-06-23 ENCOUNTER — Ambulatory Visit (INDEPENDENT_AMBULATORY_CARE_PROVIDER_SITE_OTHER): Payer: BC Managed Care – PPO | Admitting: Family Medicine

## 2018-06-23 ENCOUNTER — Other Ambulatory Visit: Payer: Self-pay

## 2018-06-23 ENCOUNTER — Encounter (INDEPENDENT_AMBULATORY_CARE_PROVIDER_SITE_OTHER): Payer: Self-pay | Admitting: Family Medicine

## 2018-06-23 DIAGNOSIS — G4709 Other insomnia: Secondary | ICD-10-CM | POA: Insufficient documentation

## 2018-06-23 DIAGNOSIS — E559 Vitamin D deficiency, unspecified: Secondary | ICD-10-CM | POA: Diagnosis not present

## 2018-06-23 DIAGNOSIS — Z6841 Body Mass Index (BMI) 40.0 and over, adult: Secondary | ICD-10-CM | POA: Diagnosis not present

## 2018-06-23 MED ORDER — VITAMIN D3 1.25 MG (50000 UT) PO CAPS: 1.0000 | ORAL_CAPSULE | ORAL | 0 refills | Status: DC

## 2018-06-23 NOTE — Progress Notes (Signed)
Office: [phone]  /  Fax: [phone] TeleHealth Visit:  Savannah [name] has verbally consented to this TeleHealth visit today. The patient is located at home, the provider is located at the [facility] office. The participants in this visit include the listed provider and patient. The visit was conducted today via Webex.  HPI:   Chief Complaint: OBESITY Savannah [name] is here to discuss her progress with her obesity treatment plan. She is on the Category 3 plan and is following her eating plan approximately 60% of the time. She states she is exercising 0 minutes 0 times per week. Savannah [name] had a recent birthday and has been slightly off the plan. She reports being bored with food options.  We were unable to weigh the patient today for this TeleHealth visit. She feels as if she has gained 1 lb since her last visit. She has lost 16 lbs since starting treatment with [place].  Vitamin D deficiency Savannah [name] has a diagnosis of Vitamin D deficiency, which is not at goal. Her last Vitamin D3 was 27.9 on 02/24/2018. She is currently taking prescription Vit D and denies nausea, vomiting or muscle weakness.  Insomnia Savannah [name] has been having trouble sleeping over the past month and has tried Zzzquil without success. She has had periods of time in the past when insomnia was an issue.  She does report snoring and has never had a sleep study.  Sleep hygiene is poor in regard to avoiding electronic devises before bedtime. She does avoid caffeine after 4 pm. She is nervous about taking prescription sleep medication.  ASSESSMENT AND PLAN:  Vitamin D deficiency - Plan: Cholecalciferol (VITAMIN D3) 1.25 MG (50000 UT) CAPS  Other insomnia  Class 3 severe obesity with serious comorbidity and body mass index (BMI) of 40.0 to 44.9 in adult, unspecified obesity type ([place])  PLAN:  Vitamin D Deficiency Arnella was informed that low Vitamin D levels contributes to fatigue and are associated with obesity,  breast, and colon cancer. She agrees to continue to take prescription Vit D3 @ 50,000 IU every week #4 with 0 refills and will follow-up for routine testing of Vitamin D, at least 2-3 times per year. She was informed of the risk of over-replacement of Vitamin D and agrees to not increase her dose unless she discusses this with [place] first. Shimika agrees to follow-up with our clinic in 2 weeks.  Insomnia We discussed good sleep hygiene with Savannah [name]. I encouraged her to try Lavender aromatherapy and to avoid using electronic devices 1 hour before bedtime.  I advised her that her insomnia may be due to stress from the pandemic. She will call her PCP and if insomnia continues. We may consider a sleep study in the future.  Obesity Savannah [name] is currently in the action stage of change. As such, her goal is to continue with weight loss efforts. She has agreed to follow the Category 3 plan or journal 1500-1600 calories + 90-100 grams of protein daily. Handouts were sent to the patient via MyChart on Journaling, Protein Content, and Eating Out. We discussed the following Behavioral Modification Strategies today: planning for success and keep a strict food journal.  Savannah [name] has agreed to follow-up with our clinic in 2 weeks. She was informed of the importance of frequent follow-up visits to maximize her success with intensive lifestyle modifications for her multiple health conditions.  ALLERGIES: Allergies  Allergen Reactions  . Dust Mite Extract Other (See Comments)    Environmental allergies    MEDICATIONS:  Current Outpatient Medications on File Prior to Visit  Medication Sig Dispense Refill  . acetaminophen (TYLENOL) 500 MG tablet Take 500 mg by mouth as directed.    . calcium carbonate (OSCAL) 1500 (600 Ca) MG TABS tablet Take by mouth 2 (two) times daily with a meal.    . CALCIUM PO Take 1 tablet by mouth daily.     . carvedilol (COREG CR) 10 MG 24 hr capsule Take 10 mg by mouth daily.    .  carvedilol (COREG) 3.125 MG tablet Take 1 tablet (3.125 mg total) by mouth 2 (two) times daily. 180 tablet 3  . clobetasol cream (TEMOVATE) 0.05 % APPLY A PEA SIZED AMOUNT   TOPICALLY TWO TIMES A DAY  FOR 1-2 WEEKS -NOT FOR    DAILY LONG TERM USE 60 g 0  . cyclobenzaprine (FLEXERIL) 10 MG tablet Take 1 tablet (10 mg total) by mouth 3 (three) times daily as needed for muscle spasms. 30 tablet 1  . diclofenac sodium (VOLTAREN) 1 % GEL Apply topically 4 (four) times daily.    [name] eletriptan (RELPAX) 40 MG tablet Take 1 tablet (40 mg total) by mouth 2 (two) times daily as needed for migraine or headache. 10 tablet 5  . glucosamine-chondroitin 500-400 MG tablet Take 1 tablet by mouth 3 (three) times daily.    [name] HYDROcodone-acetaminophen (NORCO/VICODIN) 5-325 MG tablet Take 1 tablet by mouth as directed.  0  . hyoscyamine (LEVSIN, ANASPAZ) 0.125 MG tablet Take 1 tablet (0.125 mg total) by mouth every 4 (four) hours as needed. 30 tablet 1  . lisinopril (PRINIVIL,ZESTRIL) 10 MG tablet Take 1 tablet (10 mg total) by mouth daily. 90 tablet 3  . methocarbamol (ROBAXIN) 750 MG tablet Take 750 mg by mouth as directed.     . multivitamin-lutein (OCUVITE-LUTEIN) CAPS capsule Take 1 capsule by mouth daily.    . Omega-3 Fatty Acids (FISH OIL PO) Take 1 tablet by mouth daily.     [name] PARoxetine (PAXIL) 10 MG tablet Take 1 tablet (10 mg total) by mouth every morning. 90 tablet 1  . Potassium 99 MG TABS Take by mouth.    . venlafaxine XR (EFFEXOR-XR) 75 MG 24 hr capsule TAKE 1 CAPSULE DAILY WITH  BREAKFAST 90 capsule 1   No current facility-administered medications on file prior to visit.     PAST MEDICAL HISTORY: Past Medical History:  Diagnosis Date  . Arthritis    bilateral knees, recent LCL sprain-on left knee  . CHF (congestive heart failure) ([place])   . Environmental allergies   . Fatty liver   . Headache(784.0)    migraines-on meds for control, relpax, ketoprophen, vicoden, muscle relaxer  . Hearing loss  2016   High frequency hearing - Bilateral   . Hot flashes   . Joint pain   . Lichen sclerosus   . Lipoma    Right Knee  . Menorrhagia   . Migraines   . Necrobiosis lipoidica   . Osteoarthritis   . Overweight   . Pelvic pain in female   . PONV (postoperative nausea and vomiting)    ponv, slow to awake  . Seasonal allergies   . Sinus problem   . Sprain 10/25/2010   left knee  . Trigeminal pulse   . Trigger finger, right    Middle finger and right wrist   . Vitamin D deficiency     PAST SURGICAL HISTORY: Past Surgical History:  Procedure Laterality Date  . APPENDECTOMY    .  DIAGNOSTIC LAPAROSCOPY  08/2006  . DILATION AND CURETTAGE OF UTERUS  2011   attempted ablation  . FUNCTIONAL ENDOSCOPIC SINUS SURGERY    . ganglion cyst removal    . HYSTEROSCOPY     failed  . LASIK    . LIPOMA EXCISION Right 07/22/2014   Procedure: EXCISION RIGHT THIGH LIPOMA;  Surgeon: Erroll Luna, MD;  Location: [facility];  Service: General;  Laterality: Right;  . ROBOTIC ASSISTED TOTAL HYSTERECTOMY  11/06/10   TLH/RSO  . SALPINGOOPHORECTOMY  11/06/2010   Procedure: SALPINGO OOPHERECTOMY;  Surgeon: Felipa Emory;  Location: [facility];  Service: Gynecology;  Laterality: Right;    SOCIAL HISTORY: Social History   Tobacco Use  . Smoking status: Never Smoker  . Smokeless tobacco: Never Used  Substance Use Topics  . Alcohol use: No    Alcohol/week: 0.0 standard drinks  . Drug use: No    FAMILY HISTORY: Family History  Problem Relation Age of Onset  . Skin cancer Mother   . Migraines Mother   . High blood pressure Mother   . Heart disease Father   . High blood pressure Father   . High Cholesterol Father   . Breast cancer Maternal Aunt 70  . Breast cancer Paternal Aunt 27  . Migraines Brother    ROS: Review of Systems  Gastrointestinal: Negative for nausea and vomiting.  Musculoskeletal:       Negative for muscle weakness.  Psychiatric/Behavioral: The patient has  insomnia.    PHYSICAL EXAM: Pt in no acute distress  RECENT LABS AND TESTS: BMET    Component Value Date/Time   NA 141 02/24/2018 1019   K 4.6 02/24/2018 1019   CL 105 02/24/2018 1019   CO2 20 02/24/2018 1019   GLUCOSE 67 02/24/2018 1019   GLUCOSE 89 04/15/2015 1512   BUN 10 02/24/2018 1019   CREATININE 0.78 02/24/2018 1019   CREATININE 0.85 04/15/2015 1512   CALCIUM 9.4 02/24/2018 1019   GFRNONAA 84 02/24/2018 1019   GFRAA 97 02/24/2018 1019   Lab Results  Component Value Date   HGBA1C 5.8 (H) 02/24/2018   Lab Results  Component Value Date   INSULIN 14.1 02/24/2018   CBC    Component Value Date/Time   WBC CANCELED 10/18/2017 1449   WBC 9.6 04/15/2015 1512   RBC 4.66 04/15/2015 1512   HGB 13.6 04/15/2015 1512   HGB 14.1 12/22/2013 1605   HCT 41.8 04/15/2015 1512   PLT 319 04/15/2015 1512   MCV 89.7 04/15/2015 1512   MCH 29.2 04/15/2015 1512   MCHC 32.5 04/15/2015 1512   RDW 14.0 04/15/2015 1512   LYMPHSABS 2.3 08/02/2009 1442   MONOABS 0.6 08/02/2009 1442   EOSABS 0.2 08/02/2009 1442   BASOSABS 0.0 08/02/2009 1442   Iron/TIBC/Ferritin/ %Sat No results found for: IRON, TIBC, FERRITIN, IRONPCTSAT Lipid Panel     Component Value Date/Time   CHOL 190 10/18/2017 1449   TRIG 148 10/18/2017 1449   HDL 45 10/18/2017 1449   CHOLHDL 4.2 10/18/2017 1449   CHOLHDL 4.1 04/15/2015 1512   VLDL 36 (H) 04/15/2015 1512   LDLCALC 115 (H) 10/18/2017 1449   Hepatic Function Panel     Component Value Date/Time   PROT 6.9 02/24/2018 1019   ALBUMIN 4.0 02/24/2018 1019   AST 24 02/24/2018 1019   ALT 30 02/24/2018 1019   ALKPHOS 145 (H) 02/24/2018 1019   BILITOT 0.3 02/24/2018 1019      Component Value Date/Time  TSH 3.670 10/18/2017 1449   TSH 3.41 04/15/2015 1512   TSH 3.064 11/20/2012 1544   Results for [place], Ortha L "[name]" (MRN [id number]) as of 06/23/2018 13:50  Ref. Range 02/24/2018 10:19  Vitamin D, 25-Hydroxy Latest Ref Range: 30.0 - 100.0 ng/mL 27.9  (L)   I, Michaelene Song, am acting as Location manager for [employer], FNP-C.  I have reviewed the above documentation for accuracy and completeness, and I agree with the above.  - Vedder Brittian, FNP-C.

## 2018-06-24 ENCOUNTER — Ambulatory Visit (INDEPENDENT_AMBULATORY_CARE_PROVIDER_SITE_OTHER): Payer: Self-pay | Admitting: Bariatrics

## 2018-06-29 ENCOUNTER — Encounter: Payer: Self-pay | Admitting: Orthopaedic Surgery

## 2018-07-01 ENCOUNTER — Other Ambulatory Visit: Payer: Self-pay | Admitting: Obstetrics & Gynecology

## 2018-07-01 NOTE — Telephone Encounter (Signed)
Medication refill request: paxil  Last AEX:  10/18/17 SM Next AEX: 02/17/19 Last MMG (if hormonal medication request): 04/17/17 [id number]  Refill authorized: 10/18/17 #[id number]. Today please advise.

## 2018-07-04 ENCOUNTER — Encounter (INDEPENDENT_AMBULATORY_CARE_PROVIDER_SITE_OTHER): Payer: Self-pay | Admitting: Bariatrics

## 2018-07-08 ENCOUNTER — Other Ambulatory Visit: Payer: Self-pay

## 2018-07-08 ENCOUNTER — Encounter (INDEPENDENT_AMBULATORY_CARE_PROVIDER_SITE_OTHER): Payer: Self-pay | Admitting: Bariatrics

## 2018-07-08 ENCOUNTER — Ambulatory Visit (INDEPENDENT_AMBULATORY_CARE_PROVIDER_SITE_OTHER): Payer: BC Managed Care – PPO | Admitting: Bariatrics

## 2018-07-08 DIAGNOSIS — E559 Vitamin D deficiency, unspecified: Secondary | ICD-10-CM

## 2018-07-08 DIAGNOSIS — Z6841 Body Mass Index (BMI) 40.0 and over, adult: Secondary | ICD-10-CM | POA: Diagnosis not present

## 2018-07-08 DIAGNOSIS — R7303 Prediabetes: Secondary | ICD-10-CM

## 2018-07-08 NOTE — Progress Notes (Signed)
Office: [phone]  /  Fax: [phone] TeleHealth Visit:  Savannah [name] has verbally consented to this TeleHealth visit today. The patient is located at home, the provider is located at the [facility] office. The participants in this visit include the listed provider and patient and any and all parties involved. The visit was conducted today via WebEx.  HPI:   Chief Complaint: OBESITY Savannah [name] is here to discuss her progress with her obesity treatment plan. She is on the Category 3 plan and is following her eating plan approximately 65 to 70 % of the time. She states she is pedaling 30 minutes 5 times per week. Savannah [name] states that her weight remains the same. She is working from home. Savannah [name] is doing well with protein. We were unable to weigh the patient today for this TeleHealth visit. She feels as if she has maintained weight since her last visit. She has lost 21 lbs since starting treatment with [place].  Pre-Diabetes [name] has a diagnosis of prediabetes based on her elevated Hgb A1c and was informed this puts her at greater risk of developing diabetes. Her last A1c was at 45.8 and last insulin level was at 14.1 She is not taking medications currently and continues to work on diet and exercise to decrease risk of diabetes. She denies nausea or hypoglycemia.  Vitamin D deficiency [name] has a diagnosis of vitamin D deficiency. She is taking high dose vit D and denies nausea, vomiting or muscle weakness.  ASSESSMENT AND PLAN:  Prediabetes - Plan: Comprehensive metabolic panel, Hemoglobin A1c, Insulin, random, Lipid Panel With LDL/HDL Ratio  Vitamin D deficiency - Plan: VITAMIN D 25 Hydroxy (Vit-D Deficiency, Fractures)  Class 3 severe obesity with serious comorbidity and body mass index (BMI) of 40.0 to 44.9 in adult, unspecified obesity type ([place])  PLAN:  Pre-Diabetes [name] will continue to work on weight loss, exercise, increasing lean protein and decreasing  simple carbohydrates in her diet to help decrease the risk of diabetes. She was informed that eating too many simple carbohydrates or too many calories at one sitting increases the likelihood of GI side effects. Savannah [name] agreed to follow up with [place] as directed to monitor her progress.  Vitamin D Deficiency Savannah [name] was informed that low vitamin D levels contributes to fatigue and are associated with obesity, breast, and colon cancer. She will continue to take prescription Vit D @50 ,000 IU every week and will follow up for routine testing of vitamin D, at least 2-3 times per year. She was informed of the risk of over-replacement of vitamin D and agrees to not increase her dose unless she discusses this with [place] first.  Obesity Savannah [name] is currently in the action stage of change. As such, her goal is to continue with weight loss efforts She has agreed to follow the Category 3 plan or keep a food journal with 1500 to 1600 calories and 90 to 100 grams of protein daily Savannah [name] will continue exercise [name]) for weight loss and overall health benefits. We discussed the following Behavioral Modification Strategies today: increase H2O intake, no skipping meals, keeping healthy foods in the home, increasing lean protein intake, decreasing simple carbohydrates, increasing vegetables, decrease eating out and work on meal planning and easy cooking plans Savannah [name] will weigh herself at home until she returns to the office.  Savannah [name] has agreed to follow up with our clinic in 2 weeks. She was informed of the importance of frequent follow up visits to maximize her success with intensive  lifestyle modifications for her multiple health conditions.  ALLERGIES: Allergies  Allergen Reactions  . Dust Mite Extract Other (See Comments)    Environmental allergies    MEDICATIONS: Current Outpatient Medications on File Prior to Visit  Medication Sig Dispense Refill  . acetaminophen (TYLENOL) 500 MG tablet Take 500 mg by  mouth as directed.    . calcium carbonate (OSCAL) 1500 (600 Ca) MG TABS tablet Take by mouth 2 (two) times daily with a meal.    . CALCIUM PO Take 1 tablet by mouth daily.     . carvedilol (COREG CR) 10 MG 24 hr capsule Take 10 mg by mouth daily.    . carvedilol (COREG) 3.125 MG tablet Take 1 tablet (3.125 mg total) by mouth 2 (two) times daily. 180 tablet 3  . Cholecalciferol (VITAMIN D3) 1.25 MG (50000 UT) CAPS Take 1 Dose by mouth once a week. 4 capsule 0  . clobetasol cream (TEMOVATE) 0.05 % APPLY A PEA SIZED AMOUNT   TOPICALLY TWO TIMES A DAY  FOR 1-2 WEEKS -NOT FOR    DAILY LONG TERM USE 60 g 0  . cyclobenzaprine (FLEXERIL) 10 MG tablet Take 1 tablet (10 mg total) by mouth 3 (three) times daily as needed for muscle spasms. 30 tablet 1  . diclofenac sodium (VOLTAREN) 1 % GEL Apply topically 4 (four) times daily.    [name] eletriptan (RELPAX) 40 MG tablet Take 1 tablet (40 mg total) by mouth 2 (two) times daily as needed for migraine or headache. 10 tablet 5  . glucosamine-chondroitin 500-400 MG tablet Take 1 tablet by mouth 3 (three) times daily.    [name] HYDROcodone-acetaminophen (NORCO/VICODIN) 5-325 MG tablet Take 1 tablet by mouth as directed.  0  . hyoscyamine (LEVSIN, ANASPAZ) 0.125 MG tablet Take 1 tablet (0.125 mg total) by mouth every 4 (four) hours as needed. 30 tablet 1  . lisinopril (PRINIVIL,ZESTRIL) 10 MG tablet Take 1 tablet (10 mg total) by mouth daily. 90 tablet 3  . methocarbamol (ROBAXIN) 750 MG tablet Take 750 mg by mouth as directed.     . multivitamin-lutein (OCUVITE-LUTEIN) CAPS capsule Take 1 capsule by mouth daily.    . Omega-3 Fatty Acids (FISH OIL PO) Take 1 tablet by mouth daily.     [name] PARoxetine (PAXIL) 10 MG tablet TAKE 1 TABLET EVERY        MORNING. 90 tablet 1  . Potassium 99 MG TABS Take by mouth.    . venlafaxine XR (EFFEXOR-XR) 75 MG 24 hr capsule TAKE 1 CAPSULE DAILY WITH  BREAKFAST 90 capsule 1   No current facility-administered medications on file prior to  visit.     PAST MEDICAL HISTORY: Past Medical History:  Diagnosis Date  . Arthritis    bilateral knees, recent LCL sprain-on left knee  . CHF (congestive heart failure) ([place])   . Environmental allergies   . Fatty liver   . Headache(784.0)    migraines-on meds for control, relpax, ketoprophen, vicoden, muscle relaxer  . Hearing loss 2016   High frequency hearing - Bilateral   . Hot flashes   . Joint pain   . Lichen sclerosus   . Lipoma    Right Knee  . Menorrhagia   . Migraines   . Necrobiosis lipoidica   . Osteoarthritis   . Overweight   . Pelvic pain in female   . PONV (postoperative nausea and vomiting)    ponv, slow to awake  . Seasonal allergies   . Sinus problem   .  Sprain 10/25/2010   left knee  . Trigeminal pulse   . Trigger finger, right    Middle finger and right wrist   . Vitamin D deficiency     PAST SURGICAL HISTORY: Past Surgical History:  Procedure Laterality Date  . APPENDECTOMY    . DIAGNOSTIC LAPAROSCOPY  08/2006  . DILATION AND CURETTAGE OF UTERUS  2011   attempted ablation  . FUNCTIONAL ENDOSCOPIC SINUS SURGERY    . ganglion cyst removal    . HYSTEROSCOPY     failed  . LASIK    . LIPOMA EXCISION Right 07/22/2014   Procedure: EXCISION RIGHT THIGH LIPOMA;  Surgeon: Erroll Luna, MD;  Location: [place] [facility];  Service: General;  Laterality: Right;  . ROBOTIC ASSISTED TOTAL HYSTERECTOMY  11/06/10   TLH/RSO  . SALPINGOOPHORECTOMY  11/06/2010   Procedure: SALPINGO OOPHERECTOMY;  Surgeon: Felipa Emory;  Location: [facility];  Service: Gynecology;  Laterality: Right;    SOCIAL HISTORY: Social History   Tobacco Use  . Smoking status: Never Smoker  . Smokeless tobacco: Never Used  Substance Use Topics  . Alcohol use: No    Alcohol/week: 0.0 standard drinks  . Drug use: No    FAMILY HISTORY: Family History  Problem Relation Age of Onset  . Skin cancer Mother   . Migraines Mother   . High blood pressure Mother   . Heart  disease Father   . High blood pressure Father   . High Cholesterol Father   . Breast cancer Maternal Aunt 70  . Breast cancer Paternal Aunt 27  . Migraines Brother     ROS: Review of Systems  Constitutional: Negative for weight loss.  Gastrointestinal: Negative for nausea and vomiting.  Musculoskeletal:       Negative for muscle weakness    PHYSICAL EXAM: Pt in no acute distress  RECENT LABS AND TESTS: BMET    Component Value Date/Time   NA 141 02/24/2018 1019   K 4.6 02/24/2018 1019   CL 105 02/24/2018 1019   CO2 20 02/24/2018 1019   GLUCOSE 67 02/24/2018 1019   GLUCOSE 89 04/15/2015 1512   BUN 10 02/24/2018 1019   CREATININE 0.78 02/24/2018 1019   CREATININE 0.85 04/15/2015 1512   CALCIUM 9.4 02/24/2018 1019   GFRNONAA 84 02/24/2018 1019   GFRAA 97 02/24/2018 1019   Lab Results  Component Value Date   HGBA1C 5.8 (H) 02/24/2018   Lab Results  Component Value Date   INSULIN 14.1 02/24/2018   CBC    Component Value Date/Time   WBC CANCELED 10/18/2017 1449   WBC 9.6 04/15/2015 1512   RBC 4.66 04/15/2015 1512   HGB 13.6 04/15/2015 1512   HGB 14.1 12/22/2013 1605   HCT 41.8 04/15/2015 1512   PLT 319 04/15/2015 1512   MCV 89.7 04/15/2015 1512   MCH 29.2 04/15/2015 1512   MCHC 32.5 04/15/2015 1512   RDW 14.0 04/15/2015 1512   LYMPHSABS 2.3 08/02/2009 1442   MONOABS 0.6 08/02/2009 1442   EOSABS 0.2 08/02/2009 1442   BASOSABS 0.0 08/02/2009 1442   Iron/TIBC/Ferritin/ %Sat No results found for: IRON, TIBC, FERRITIN, IRONPCTSAT Lipid Panel     Component Value Date/Time   CHOL 190 10/18/2017 1449   TRIG 148 10/18/2017 1449   HDL 45 10/18/2017 1449   CHOLHDL 4.2 10/18/2017 1449   CHOLHDL 4.1 04/15/2015 1512   VLDL 36 (H) 04/15/2015 1512   LDLCALC 115 (H) 10/18/2017 1449   Hepatic Function Panel  Component Value Date/Time   PROT 6.9 02/24/2018 1019   ALBUMIN 4.0 02/24/2018 1019   AST 24 02/24/2018 1019   ALT 30 02/24/2018 1019   ALKPHOS 145 (H)  02/24/2018 1019   BILITOT 0.3 02/24/2018 1019      Component Value Date/Time   TSH 3.670 10/18/2017 1449   TSH 3.41 04/15/2015 1512   TSH 3.064 11/20/2012 1544     Ref. Range 02/24/2018 10:19  Vitamin D, 25-Hydroxy Latest Ref Range: 30.0 - 100.0 ng/mL 27.9 (L)    I, Doreene Nest, am acting as Location manager for [employer]. Owens Shark, DO  I have reviewed the above documentation for accuracy and completeness, and I agree with the above. -Jearld Lesch, DO

## 2018-07-16 ENCOUNTER — Other Ambulatory Visit: Payer: Self-pay

## 2018-07-16 ENCOUNTER — Ambulatory Visit
Admission: RE | Admit: 2018-07-16 | Discharge: 2018-07-16 | Disposition: A | Discharge status: Home / Self Care | Payer: BC Managed Care – PPO | Source: Ambulatory Visit | Attending: Obstetrics & Gynecology | Admitting: Obstetrics & Gynecology

## 2018-07-16 DIAGNOSIS — Z1231 Encounter for screening mammogram for malignant neoplasm of breast: Secondary | ICD-10-CM

## 2018-07-22 ENCOUNTER — Ambulatory Visit (INDEPENDENT_AMBULATORY_CARE_PROVIDER_SITE_OTHER): Payer: BC Managed Care – PPO | Admitting: Bariatrics

## 2018-07-22 ENCOUNTER — Other Ambulatory Visit: Payer: Self-pay

## 2018-07-22 ENCOUNTER — Encounter (INDEPENDENT_AMBULATORY_CARE_PROVIDER_SITE_OTHER): Payer: Self-pay | Admitting: Bariatrics

## 2018-07-22 DIAGNOSIS — Z6841 Body Mass Index (BMI) 40.0 and over, adult: Secondary | ICD-10-CM | POA: Diagnosis not present

## 2018-07-22 DIAGNOSIS — E559 Vitamin D deficiency, unspecified: Secondary | ICD-10-CM | POA: Diagnosis not present

## 2018-07-22 DIAGNOSIS — E66813 Obesity, class 3: Secondary | ICD-10-CM

## 2018-07-22 DIAGNOSIS — R7303 Prediabetes: Secondary | ICD-10-CM

## 2018-07-22 LAB — COMPREHENSIVE METABOLIC PANEL
ALT: 24 IU/L (ref 0–32)
AST: 25 IU/L (ref 0–40)
Albumin/Globulin Ratio: 1.6 (ref 1.2–2.2)
Albumin: 4.1 g/dL (ref 3.8–4.9)
Alkaline Phosphatase: 126 IU/L — ABNORMAL HIGH (ref 39–117)
BUN/Creatinine Ratio: 16 (ref 9–23)
BUN: 14 mg/dL (ref 6–24)
Bilirubin Total: 0.3 mg/dL (ref 0.0–1.2)
CO2: 20 mmol/L (ref 20–29)
Calcium: 9.3 mg/dL (ref 8.7–10.2)
Chloride: 106 mmol/L (ref 96–106)
Creatinine, Ser: 0.85 mg/dL (ref 0.57–1.00)
GFR calc Af Amer: 87 mL/min/{1.73_m2} (ref >59)
GFR calc non Af Amer: 75 mL/min/{1.73_m2} (ref >59)
Globulin, Total: 2.5 g/dL (ref 1.5–4.5)
Glucose: 95 mg/dL (ref 65–99)
Potassium: 4.8 mmol/L (ref 3.5–5.2)
Sodium: 139 mmol/L (ref 134–144)
Total Protein: 6.6 g/dL (ref 6.0–8.5)

## 2018-07-22 LAB — LIPID PANEL WITH LDL/HDL RATIO
Cholesterol, Total: 166 mg/dL (ref 100–199)
HDL: 44 mg/dL (ref >39)
LDL Calculated: 95 mg/dL (ref 0–99)
LDl/HDL Ratio: 2.2 ratio (ref 0.0–3.2)
Triglycerides: 133 mg/dL (ref 0–149)
VLDL Cholesterol Cal: 27 mg/dL (ref 5–40)

## 2018-07-22 LAB — HEMOGLOBIN A1C
Est. average glucose Bld gHb Est-mCnc: 114 mg/dL
Hgb A1c MFr Bld: 5.6 % (ref 4.8–5.6)

## 2018-07-22 LAB — INSULIN, RANDOM: INSULIN: 19.3 u[IU]/mL (ref 2.6–24.9)

## 2018-07-22 LAB — VITAMIN D 25 HYDROXY (VIT D DEFICIENCY, FRACTURES): Vit D, 25-Hydroxy: 58.1 ng/mL (ref 30.0–100.0)

## 2018-07-23 ENCOUNTER — Ambulatory Visit (INDEPENDENT_AMBULATORY_CARE_PROVIDER_SITE_OTHER): Payer: BC Managed Care – PPO | Admitting: Bariatrics

## 2018-07-23 NOTE — Progress Notes (Signed)
Office: [phone]  /  Fax: [phone] TeleHealth Visit:  Savannah [name] has verbally consented to this TeleHealth visit today. The patient is located at home, the provider is located at the [facility] office. The participants in this visit include the listed provider and patient and any and all parties involved. The visit was conducted today via WebEx.  HPI:   Chief Complaint: OBESITY Savannah [name] is here to discuss her progress with her obesity treatment plan. She is on the Category 3 plan and is following her eating plan approximately 70 % of the time. She states she is pedaling 15 minutes 7 times per week. Savannah [name] states that her weight remains the same. She is doing well with the protein. She is eating fewer carbohydrates. We were unable to weigh the patient today for this TeleHealth visit. She feels as if she has maintained weight since her last visit. She has lost 20 lbs since starting treatment with [place].  Vitamin D deficiency Savannah [name] has a diagnosis of vitamin D deficiency. Savannah [name] is currently taking vit D and she denies nausea, vomiting or muscle weakness.  Pre-Diabetes Savannah [name] has a diagnosis of prediabetes based on her elevated Hgb A1c and was informed this puts her at greater risk of developing diabetes. Her last A1c was at 5.6 and last insulin level was at 19.3 She is not taking medications currently and continues to work on diet and exercise to decrease risk of diabetes. She denies nausea or hypoglycemia.  ASSESSMENT AND PLAN:  Vitamin D deficiency  Prediabetes  Class 3 severe obesity with serious comorbidity and body mass index (BMI) of 40.0 to 44.9 in adult, unspecified obesity type ([place])  PLAN:  Vitamin D Deficiency Savannah [name] was informed that low vitamin D levels contributes to fatigue and are associated with obesity, breast, and colon cancer. She will begin Vitamin D 2,000 IU daily and will follow up for routine testing of vitamin D, at least 2-3 times  per year. She was informed of the risk of over-replacement of vitamin D and agrees to not increase her dose unless she discusses this with [place] first. Savannah [name] agrees to follow up as directed.  Pre-Diabetes Savannah [name] will continue to work on weight loss, exercise, increasing lean protein and decreasing simple carbohydrates in her diet to help decrease the risk of diabetes. She was informed that eating too many simple carbohydrates or too many calories at one sitting increases the likelihood of GI side effects. Savannah [name] agreed to follow up with [place] as directed to monitor her progress.  Obesity Savannah [name] is currently in the action stage of change. As such, her goal is to continue with weight loss efforts She has agreed to follow the Category 3 plan Savannah [name] will continue exercise Savannah Mans) for weight loss and overall health benefits. We discussed the following Behavioral Modification Strategies today: increase I7T intake (application "Drink Water"), no skipping meals, keeping healthy foods in the home, increasing lean protein intake, decreasing simple carbohydrates, increasing vegetables, decrease eating out and work on meal planning and easy cooking plans Savannah [name] will weigh herself at home until she returns to the office.  Savannah [name] has agreed to follow up with our clinic in 2 weeks. She was informed of the importance of frequent follow up visits to maximize her success with intensive lifestyle modifications for her multiple health conditions.  ALLERGIES: Allergies  Allergen Reactions  . Dust Mite Extract Other (See Comments)    Environmental allergies    MEDICATIONS: Current Outpatient Medications on File Prior  to Visit  Medication Sig Dispense Refill  . acetaminophen (TYLENOL) 500 MG tablet Take 500 mg by mouth as directed.    . calcium carbonate (OSCAL) 1500 (600 Ca) MG TABS tablet Take by mouth 2 (two) times daily with a meal.    . CALCIUM PO Take 1 tablet by mouth daily.     . carvedilol (COREG CR)  10 MG 24 hr capsule Take 10 mg by mouth daily.    . carvedilol (COREG) 3.125 MG tablet Take 1 tablet (3.125 mg total) by mouth 2 (two) times daily. 180 tablet 3  . Cholecalciferol (VITAMIN D3) 1.25 MG (50000 UT) CAPS Take 1 Dose by mouth once a week. 4 capsule 0  . clobetasol cream (TEMOVATE) 0.05 % APPLY A PEA SIZED AMOUNT   TOPICALLY TWO TIMES A DAY  FOR 1-2 WEEKS -NOT FOR    DAILY LONG TERM USE 60 g 0  . cyclobenzaprine (FLEXERIL) 10 MG tablet Take 1 tablet (10 mg total) by mouth 3 (three) times daily as needed for muscle spasms. 30 tablet 1  . diclofenac sodium (VOLTAREN) 1 % GEL Apply topically 4 (four) times daily.    [name] eletriptan (RELPAX) 40 MG tablet Take 1 tablet (40 mg total) by mouth 2 (two) times daily as needed for migraine or headache. 10 tablet 5  . glucosamine-chondroitin 500-400 MG tablet Take 1 tablet by mouth 3 (three) times daily.    [name] HYDROcodone-acetaminophen (NORCO/VICODIN) 5-325 MG tablet Take 1 tablet by mouth as directed.  0  . hyoscyamine (LEVSIN, ANASPAZ) 0.125 MG tablet Take 1 tablet (0.125 mg total) by mouth every 4 (four) hours as needed. 30 tablet 1  . lisinopril (PRINIVIL,ZESTRIL) 10 MG tablet Take 1 tablet (10 mg total) by mouth daily. 90 tablet 3  . methocarbamol (ROBAXIN) 750 MG tablet Take 750 mg by mouth as directed.     . multivitamin-lutein (OCUVITE-LUTEIN) CAPS capsule Take 1 capsule by mouth daily.    . Omega-3 Fatty Acids (FISH OIL PO) Take 1 tablet by mouth daily.     [name] PARoxetine (PAXIL) 10 MG tablet TAKE 1 TABLET EVERY        MORNING. 90 tablet 1  . Potassium 99 MG TABS Take by mouth.    . venlafaxine XR (EFFEXOR-XR) 75 MG 24 hr capsule TAKE 1 CAPSULE DAILY WITH  BREAKFAST 90 capsule 1   No current facility-administered medications on file prior to visit.     PAST MEDICAL HISTORY: Past Medical History:  Diagnosis Date  . Arthritis    bilateral knees, recent LCL sprain-on left knee  . CHF (congestive heart failure) ([place])   . Environmental  allergies   . Fatty liver   . Headache(784.0)    migraines-on meds for control, relpax, ketoprophen, vicoden, muscle relaxer  . Hearing loss 2016   High frequency hearing - Bilateral   . Hot flashes   . Joint pain   . Lichen sclerosus   . Lipoma    Right Knee  . Menorrhagia   . Migraines   . Necrobiosis lipoidica   . Osteoarthritis   . Overweight   . Pelvic pain in female   . PONV (postoperative nausea and vomiting)    ponv, slow to awake  . Seasonal allergies   . Sinus problem   . Sprain 10/25/2010   left knee  . Trigeminal pulse   . Trigger finger, right    Middle finger and right wrist   . Vitamin D deficiency  PAST SURGICAL HISTORY: Past Surgical History:  Procedure Laterality Date  . APPENDECTOMY    . DIAGNOSTIC LAPAROSCOPY  08/2006  . DILATION AND CURETTAGE OF UTERUS  2011   attempted ablation  . FUNCTIONAL ENDOSCOPIC SINUS SURGERY    . ganglion cyst removal    . HYSTEROSCOPY     failed  . LASIK    . LIPOMA EXCISION Right 07/22/2014   Procedure: EXCISION RIGHT THIGH LIPOMA;  Surgeon: Erroll Luna, MD;  Location: [facility];  Service: General;  Laterality: Right;  . ROBOTIC ASSISTED TOTAL HYSTERECTOMY  11/06/10   TLH/RSO  . SALPINGOOPHORECTOMY  11/06/2010   Procedure: SALPINGO OOPHERECTOMY;  Surgeon: Felipa Emory;  Location: [facility];  Service: Gynecology;  Laterality: Right;    SOCIAL HISTORY: Social History   Tobacco Use  . Smoking status: Never Smoker  . Smokeless tobacco: Never Used  Substance Use Topics  . Alcohol use: No    Alcohol/week: 0.0 standard drinks  . Drug use: No    FAMILY HISTORY: Family History  Problem Relation Age of Onset  . Skin cancer Mother   . Migraines Mother   . High blood pressure Mother   . Heart disease Father   . High blood pressure Father   . High Cholesterol Father   . Breast cancer Maternal Aunt 70  . Breast cancer Paternal Aunt 70  . Migraines Brother     ROS: Review of Systems   Constitutional: Negative for weight loss.  Gastrointestinal: Negative for nausea and vomiting.  Musculoskeletal:       Negative for muscle weakness  Endo/Heme/Allergies:       Negative for hypoglycemia    PHYSICAL EXAM: Pt in no acute distress  RECENT LABS AND TESTS: BMET    Component Value Date/Time   NA 139 07/21/2018 1035   K 4.8 07/21/2018 1035   CL 106 07/21/2018 1035   CO2 20 07/21/2018 1035   GLUCOSE 95 07/21/2018 1035   GLUCOSE 89 04/15/2015 1512   BUN 14 07/21/2018 1035   CREATININE 0.85 07/21/2018 1035   CREATININE 0.85 04/15/2015 1512   CALCIUM 9.3 07/21/2018 1035   GFRNONAA 75 07/21/2018 1035   GFRAA 87 07/21/2018 1035   Lab Results  Component Value Date   HGBA1C 5.6 07/21/2018   HGBA1C 5.8 (H) 02/24/2018   Lab Results  Component Value Date   INSULIN 19.3 07/21/2018   INSULIN 14.1 02/24/2018   CBC    Component Value Date/Time   WBC CANCELED 10/18/2017 1449   WBC 9.6 04/15/2015 1512   RBC 4.66 04/15/2015 1512   HGB 13.6 04/15/2015 1512   HGB 14.1 12/22/2013 1605   HCT 41.8 04/15/2015 1512   PLT 319 04/15/2015 1512   MCV 89.7 04/15/2015 1512   MCH 29.2 04/15/2015 1512   MCHC 32.5 04/15/2015 1512   RDW 14.0 04/15/2015 1512   LYMPHSABS 2.3 08/02/2009 1442   MONOABS 0.6 08/02/2009 1442   EOSABS 0.2 08/02/2009 1442   BASOSABS 0.0 08/02/2009 1442   Iron/TIBC/Ferritin/ %Sat No results found for: IRON, TIBC, FERRITIN, IRONPCTSAT Lipid Panel     Component Value Date/Time   CHOL 166 07/21/2018 1035   TRIG 133 07/21/2018 1035   HDL 44 07/21/2018 1035   CHOLHDL 4.2 10/18/2017 1449   CHOLHDL 4.1 04/15/2015 1512   VLDL 36 (H) 04/15/2015 1512   LDLCALC 95 07/21/2018 1035   Hepatic Function Panel     Component Value Date/Time   PROT 6.6 07/21/2018 1035   ALBUMIN  4.1 07/21/2018 1035   AST 25 07/21/2018 1035   ALT 24 07/21/2018 1035   ALKPHOS 126 (H) 07/21/2018 1035   BILITOT 0.3 07/21/2018 1035      Component Value Date/Time   TSH 3.670  10/18/2017 1449   TSH 3.41 04/15/2015 1512   TSH 3.064 11/20/2012 1544     Ref. Range 07/21/2018 10:35  Vitamin D, 25-Hydroxy Latest Ref Range: 30.0 - 100.0 ng/mL 58.1    I, Doreene Nest, am acting as Location manager for [employer]. Owens Shark, DO  I have reviewed the above documentation for accuracy and completeness, and I agree with the above. -Jearld Lesch, DO

## 2018-08-05 ENCOUNTER — Ambulatory Visit (INDEPENDENT_AMBULATORY_CARE_PROVIDER_SITE_OTHER): Payer: BC Managed Care – PPO | Admitting: Orthopaedic Surgery

## 2018-08-05 ENCOUNTER — Telehealth (INDEPENDENT_AMBULATORY_CARE_PROVIDER_SITE_OTHER): Payer: BC Managed Care – PPO | Admitting: Bariatrics

## 2018-08-05 ENCOUNTER — Encounter: Payer: Self-pay | Admitting: Orthopaedic Surgery

## 2018-08-05 ENCOUNTER — Other Ambulatory Visit: Payer: Self-pay

## 2018-08-05 ENCOUNTER — Ambulatory Visit: Payer: BC Managed Care – PPO

## 2018-08-05 DIAGNOSIS — M65331 Trigger finger, right middle finger: Secondary | ICD-10-CM

## 2018-08-05 DIAGNOSIS — M25562 Pain in left knee: Secondary | ICD-10-CM

## 2018-08-05 MED ORDER — BUPIVACAINE HCL 0.25 % IJ SOLN
2.0000 mL | INTRAMUSCULAR | Status: AC | PRN
  Administered 2018-08-05: 2 mL via INTRA_ARTICULAR

## 2018-08-05 MED ORDER — METHYLPREDNISOLONE ACETATE 40 MG/ML IJ SUSP
40.0000 mg | INTRAMUSCULAR | Status: AC | PRN
  Administered 2018-08-05: 40 mg via INTRA_ARTICULAR

## 2018-08-05 MED ORDER — LIDOCAINE HCL 1 % IJ SOLN
2.0000 mL | INTRAMUSCULAR | Status: AC | PRN
  Administered 2018-08-05: 2 mL

## 2018-08-05 NOTE — Progress Notes (Signed)
Office Visit Note   Patient: Savannah [name]           Date of Birth: 12-21-1960           MRN: [id number] Visit Date: 08/05/2018              Requested by: Lawerance Cruel, [place],  [place] [address] PCP: Lawerance Cruel, MD   Assessment & Plan: Visit Diagnoses:  1. Trigger finger, right middle finger   2. Left knee pain, unspecified chronicity     Plan: Impression is acute exacerbation underlying arthritis left knee.  #2 right long finger trigger finger.  In regards to the left knee, we will inject this with cortisone today.  She is continuing to make all efforts at weight loss.  She will follow-up with [place] once she gets to a BMI of under 40 to schedule left total knee arthroplasty.  In regards to the right long trigger finger, she would like to proceed with definitive treatment of A1 pulley release.  Risks, benefits and possible occasions reviewed.  Rehab recovery time discussed.  All questions were answered.  Follow-Up Instructions: Return for 2 weeks post-op.   Orders:  Orders Placed This Encounter  Procedures  . Large Joint Inj: L knee  . XR Knee 1-2 Views Left   No orders of the defined types were placed in this encounter.     Procedures: Large Joint Inj: L knee on 08/05/2018 2:24 PM Indications: pain Details: 22 G needle, anterolateral approach Medications: 2 mL bupivacaine 0.25 %; 2 mL lidocaine 1 %; 40 mg methylPREDNISolone acetate 40 MG/ML      Clinical Data: No additional findings.   Subjective: Chief Complaint  Patient presents with  . Left Knee - Pain    HPI patient is a pleasant 58 year old female who presents our clinic today with recurrent left knee pain and right long trigger finger.  In regards to the left knee, she has a history of end-stage degenerative joint disease.  She was last seen by [place] in February where a cortisone injection was performed.  She had moderately for symptoms for a few months.  She sustained a  mechanical fall to the anterior aspect of her knee on 06/19/2018.  She has had increased pain to the medial aspect since.  Increased pain with weightbearing as well as putting her knee into full extension.  She has relief of symptoms when her knee is slightly flexed.  She has been taking Tylenol without much relief.  Her last cortisone injection to the left knee was in February of this past year.  Of note, she is aware that definitive treatment is a total knee arthroplasty, and has been working on weight loss to get to a BMI of under 40.  The other issue she brings up today is recurrent right middle trigger finger.  She has had this for a while.  She was seen in our office in November 2019 for this.  Cortisone injection performed which helped for approximately 4 weeks.  Her pain and triggering has returned and is started to worsen.  She does use her hands quite frequently as she works in [employer].  She does note 2 previous cortisone injections following the when she had in our office this past November.  She is ready to proceed with definitive treatment of A1 pulley release at this point.  Review of Systems as detailed in HPI.  All others reviewed and are negative.  Objective: Vital Signs: LMP 10/14/2010   Physical Exam well-developed and well-nourished female in no acute distress.  Alert and oriented x3.  Ortho Exam examination of her left knee shows range of motion from 0 to 100 degrees.  Medial greater than lateral joint line tenderness.  No tenderness to the patella.  Mild patellofemoral crepitus.  Ligaments are stable.  She is neurovascularly intact distally.  Examination of her right long finger shows marked tenderness with a small nodule at the A1 pulley.  She does exhibit active triggering.  She is neurovascular intact distally.  Specialty Comments:  No specialty comments available.  Imaging: Xr Knee 1-2 Views Left  Result Date: 08/05/2018 X-rays demonstrate significant joint space  narrowing medial patellofemoral compartments with tricompartmental periarticular spurring.  Otherwise, no acute fractures noted.    PMFS History: Patient Active Problem List   Diagnosis Date Noted  . Other insomnia 06/23/2018  . Vitamin D deficiency 03/26/2018  . Prediabetes 03/11/2018  . Class 3 severe obesity with serious comorbidity and body mass index (BMI) of 40.0 to 44.9 in adult ([place]) 02/26/2018  . Migraines 10/01/2016  . Decreased cardiac ejection fraction 10/01/2016  . BMI 45.0-49.9, adult ([place]) 10/01/2016  . Shingles 08/03/2016  . Chronic pain of right knee 05/24/2016  . Unilateral primary osteoarthritis, right knee 05/24/2016  . Posterior tibial tendinitis, right leg 01/30/2016  . Lichen sclerosus 68/04/2120   Past Medical History:  Diagnosis Date  . Arthritis    bilateral knees, recent LCL sprain-on left knee  . CHF (congestive heart failure) ([place])   . Environmental allergies   . Fatty liver   . Headache(784.0)    migraines-on meds for control, relpax, ketoprophen, vicoden, muscle relaxer  . Hearing loss 2016   High frequency hearing - Bilateral   . Hot flashes   . Joint pain   . Lichen sclerosus   . Lipoma    Right Knee  . Menorrhagia   . Migraines   . Necrobiosis lipoidica   . Osteoarthritis   . Overweight   . Pelvic pain in female   . PONV (postoperative nausea and vomiting)    ponv, slow to awake  . Seasonal allergies   . Sinus problem   . Sprain 10/25/2010   left knee  . Trigeminal pulse   . Trigger finger, right    Middle finger and right wrist   . Vitamin D deficiency     Family History  Problem Relation Age of Onset  . Skin cancer Mother   . Migraines Mother   . High blood pressure Mother   . Heart disease Father   . High blood pressure Father   . High Cholesterol Father   . Breast cancer Maternal Aunt 70  . Breast cancer Paternal Aunt 11  . Migraines Brother     Past Surgical History:  Procedure Laterality Date  . APPENDECTOMY     . DIAGNOSTIC LAPAROSCOPY  08/2006  . DILATION AND CURETTAGE OF UTERUS  2011   attempted ablation  . FUNCTIONAL ENDOSCOPIC SINUS SURGERY    . ganglion cyst removal    . HYSTEROSCOPY     failed  . LASIK    . LIPOMA EXCISION Right 07/22/2014   Procedure: EXCISION RIGHT THIGH LIPOMA;  Surgeon: Erroll Luna, MD;  Location: [facility];  Service: General;  Laterality: Right;  . ROBOTIC ASSISTED TOTAL HYSTERECTOMY  11/06/10   TLH/RSO  . SALPINGOOPHORECTOMY  11/06/2010   Procedure: SALPINGO OOPHERECTOMY;  Surgeon: Felipa Emory;  Location: [facility];  Service: Gynecology;  Laterality: Right;   Social History   Occupational History  . Occupation: Administrator, [employer]  Tobacco Use  . Smoking status: Never Smoker  . Smokeless tobacco: Never Used  Substance and Sexual Activity  . Alcohol use: No    Alcohol/week: 0.0 standard drinks  . Drug use: No  . Sexual activity: Yes    Birth control/protection: Surgical    Comment: TLH/RSO

## 2018-08-06 ENCOUNTER — Ambulatory Visit (INDEPENDENT_AMBULATORY_CARE_PROVIDER_SITE_OTHER): Payer: BC Managed Care – PPO | Admitting: Bariatrics

## 2018-08-06 ENCOUNTER — Other Ambulatory Visit: Payer: Self-pay

## 2018-08-06 ENCOUNTER — Encounter (INDEPENDENT_AMBULATORY_CARE_PROVIDER_SITE_OTHER): Payer: Self-pay | Admitting: Bariatrics

## 2018-08-06 VITALS — BP 111/78 | HR 91 | Temp 98.3°F | Ht 65.0 in | Wt 248.0 lb

## 2018-08-06 DIAGNOSIS — R7303 Prediabetes: Secondary | ICD-10-CM

## 2018-08-06 DIAGNOSIS — Z6841 Body Mass Index (BMI) 40.0 and over, adult: Secondary | ICD-10-CM

## 2018-08-06 DIAGNOSIS — Z9189 Other specified personal risk factors, not elsewhere classified: Secondary | ICD-10-CM | POA: Diagnosis not present

## 2018-08-06 DIAGNOSIS — E559 Vitamin D deficiency, unspecified: Secondary | ICD-10-CM | POA: Diagnosis not present

## 2018-08-07 NOTE — Progress Notes (Signed)
Office: [phone]  /  Fax: [phone]   HPI:   Chief Complaint: OBESITY Savannah [name] is here to discuss her progress with her obesity treatment plan. She is on the Category 3 plan and is following her eating plan approximately 70 to 75 % of the time. She states she is pedaling and doing arm weights 25 to 45 minutes 7 times per week. Savannah [name] has lost 9 pounds and is doing well overall. Her next goal is less than 40 BMI, to have knee replacement. She had a           injection yesterday in her left knee. Her weight is 248 lb (112.5 kg) today and has had a weight loss of 9 pounds since her last in-office visit. She has lost 25 lbs since starting treatment with [place].  Vitamin D deficiency Savannah [name] has a diagnosis of vitamin D deficiency. Her last vitamin D level was at 58.1 Savannah [name] is currently taking vit D and she denies nausea, vomiting or muscle weakness.  At risk for osteopenia and osteoporosis Savannah [name] is at higher risk of osteopenia and osteoporosis due to vitamin D deficiency.   Pre-Diabetes Savannah [name] has a diagnosis of prediabetes based on her elevated Hgb A1c and was informed this puts her at greater risk of developing diabetes. Her last A1c was at 5.6 and last insulin level was at 19.3 Savannah [name] is not taking medications currently and she continues to work on diet and exercise to decrease risk of diabetes. She denies nausea or hypoglycemia.  ASSESSMENT AND PLAN:  Vitamin D deficiency  Prediabetes  At risk for osteoporosis  Class 3 severe obesity with serious comorbidity and body mass index (BMI) of 40.0 to 44.9 in adult, unspecified obesity type (Savannah [name])  PLAN:  Vitamin D Deficiency Savannah [name] was informed that low vitamin D levels contributes to fatigue and are associated with obesity, breast, and colon cancer. She agrees to begin Vit D OTC 2,000 IU daily and will follow up for routine testing of vitamin D, at least 2-3 times per year. She was informed of the risk of over-replacement of  vitamin D and agrees to not increase her dose unless she discusses this with [place] first. Savannah [name] agrees to follow up as directed.  At risk for osteopenia and osteoporosis Savannah [name] was given extended  (15 minutes) osteoporosis prevention counseling today. Savannah [name] is at risk for osteopenia and osteoporosis due to her vitamin D deficiency. She was encouraged to take her vitamin D and follow her higher calcium diet and increase strengthening exercise to help strengthen her bones and decrease her risk of osteopenia and osteoporosis.  Pre-Diabetes Savannah [name] will continue to work on weight loss, increasing lean protein and decreasing simple carbohydrates in her diet to help decrease the risk of diabetes. She was informed that eating too many simple carbohydrates or too many calories at one sitting increases the likelihood of GI side effects. Savannah [name] will continue activity and follow up with [place] as directed to monitor her progress.  Obesity Savannah [name] is currently in the action stage of change. As such, her goal is to continue with weight loss efforts She has agreed to follow the Category 3 plan Savannah [name] has been instructed to work up to a goal of 150 minutes of combined cardio and strengthening exercise per week for weight loss and overall health benefits. We discussed the following Behavioral Modification Strategies today: planning for success, increase H2O intake, no skipping meals, keeping healthy foods in the home, increasing lean protein intake, decreasing simple carbohydrates, increasing  vegetables, decrease eating out and work on meal planning and easy cooking plans Labs were reviewed with patient today.  Savannah [name] has agreed to follow up with our clinic in 2 weeks. She was informed of the importance of frequent follow up visits to maximize her success with intensive lifestyle modifications for her multiple health conditions.  ALLERGIES: Allergies  Allergen Reactions  . Dust Mite Extract Other (See  Comments)    Environmental allergies    MEDICATIONS: Current Outpatient Medications on File Prior to Visit  Medication Sig Dispense Refill  . calcium carbonate (OSCAL) 1500 (600 Ca) MG TABS tablet Take by mouth 2 (two) times daily with a meal.    . CALCIUM PO Take 1 tablet by mouth daily.     . carvedilol (COREG CR) 10 MG 24 hr capsule Take 10 mg by mouth daily.    . carvedilol (COREG) 3.125 MG tablet Take 1 tablet (3.125 mg total) by mouth 2 (two) times daily. 180 tablet 3  . Cholecalciferol (VITAMIN D3) 1.25 MG (50000 UT) CAPS Take 1 Dose by mouth once a week. 4 capsule 0  . clobetasol cream (TEMOVATE) 0.05 % APPLY A PEA SIZED AMOUNT   TOPICALLY TWO TIMES A DAY  FOR 1-2 WEEKS -NOT FOR    DAILY LONG TERM USE 60 g 0  . cyclobenzaprine (FLEXERIL) 10 MG tablet Take 1 tablet (10 mg total) by mouth 3 (three) times daily as needed for muscle spasms. 30 tablet 1  . diclofenac sodium (VOLTAREN) 1 % GEL Apply topically 4 (four) times daily.    [name] eletriptan (RELPAX) 40 MG tablet Take 1 tablet (40 mg total) by mouth 2 (two) times daily as needed for migraine or headache. 10 tablet 5  . glucosamine-chondroitin 500-400 MG tablet Take 1 tablet by mouth 3 (three) times daily.    [name] HYDROcodone-acetaminophen (NORCO/VICODIN) 5-325 MG tablet Take 1 tablet by mouth as directed.  0  . hyoscyamine (LEVSIN, ANASPAZ) 0.125 MG tablet Take 1 tablet (0.125 mg total) by mouth every 4 (four) hours as needed. 30 tablet 1  . lisinopril (PRINIVIL,ZESTRIL) 10 MG tablet Take 1 tablet (10 mg total) by mouth daily. 90 tablet 3  . multivitamin-lutein (OCUVITE-LUTEIN) CAPS capsule Take 1 capsule by mouth daily.    . Omega-3 Fatty Acids (FISH OIL PO) Take 1 tablet by mouth daily.     [name] PARoxetine (PAXIL) 10 MG tablet TAKE 1 TABLET EVERY        MORNING. 90 tablet 1  . Potassium 99 MG TABS Take by mouth.    . venlafaxine XR (EFFEXOR-XR) 75 MG 24 hr capsule TAKE 1 CAPSULE DAILY WITH  BREAKFAST 90 capsule 1  . acetaminophen  (TYLENOL) 500 MG tablet Take 500 mg by mouth as directed.    . methocarbamol (ROBAXIN) 750 MG tablet Take 750 mg by mouth as directed.      No current facility-administered medications on file prior to visit.     PAST MEDICAL HISTORY: Past Medical History:  Diagnosis Date  . Arthritis    bilateral knees, recent LCL sprain-on left knee  . CHF (congestive heart failure) ([place])   . Environmental allergies   . Fatty liver   . Headache(784.0)    migraines-on meds for control, relpax, ketoprophen, vicoden, muscle relaxer  . Hearing loss 2016   High frequency hearing - Bilateral   . Hot flashes   . Joint pain   . Lichen sclerosus   . Lipoma    Right Knee  . Menorrhagia   .  Migraines   . Necrobiosis lipoidica   . Osteoarthritis   . Overweight   . Pelvic pain in female   . PONV (postoperative nausea and vomiting)    ponv, slow to awake  . Seasonal allergies   . Sinus problem   . Sprain 10/25/2010   left knee  . Trigeminal pulse   . Trigger finger, right    Middle finger and right wrist   . Vitamin D deficiency     PAST SURGICAL HISTORY: Past Surgical History:  Procedure Laterality Date  . APPENDECTOMY    . DIAGNOSTIC LAPAROSCOPY  08/2006  . DILATION AND CURETTAGE OF UTERUS  2011   attempted ablation  . FUNCTIONAL ENDOSCOPIC SINUS SURGERY    . ganglion cyst removal    . HYSTEROSCOPY     failed  . LASIK    . LIPOMA EXCISION Right 07/22/2014   Procedure: EXCISION RIGHT THIGH LIPOMA;  Surgeon: Erroll Luna, MD;  Location: [facility];  Service: General;  Laterality: Right;  . ROBOTIC ASSISTED TOTAL HYSTERECTOMY  11/06/10   TLH/RSO  . SALPINGOOPHORECTOMY  11/06/2010   Procedure: SALPINGO OOPHERECTOMY;  Surgeon: Felipa Emory;  Location: [facility];  Service: Gynecology;  Laterality: Right;    SOCIAL HISTORY: Social History   Tobacco Use  . Smoking status: Never Smoker  . Smokeless tobacco: Never Used  Substance Use Topics  . Alcohol use: No     Alcohol/week: 0.0 standard drinks  . Drug use: No    FAMILY HISTORY: Family History  Problem Relation Age of Onset  . Skin cancer Mother   . Migraines Mother   . High blood pressure Mother   . Heart disease Father   . High blood pressure Father   . High Cholesterol Father   . Breast cancer Maternal Aunt 70  . Breast cancer Paternal Aunt 22  . Migraines Brother     ROS: Review of Systems  Constitutional: Positive for weight loss.  Gastrointestinal: Negative for nausea and vomiting.  Musculoskeletal:       Negative for muscle weakness  Endo/Heme/Allergies:       Negative for hypoglycemia    PHYSICAL EXAM: Blood pressure 111/78, pulse 91, temperature 98.3 F (36.8 C), temperature source Oral, height 5\' 5"  (1.651 m), weight 248 lb (112.5 kg), last menstrual period 10/14/2010, SpO2 96 %. Body mass index is 41.27 kg/m. Physical Exam Vitals signs reviewed.  Constitutional:      Appearance: Normal appearance. She is well-developed. She is obese.  Cardiovascular:     Rate and Rhythm: Normal rate.  Pulmonary:     Effort: Pulmonary effort is normal.  Musculoskeletal: Normal range of motion.  Skin:    General: Skin is warm and dry.  Neurological:     Mental Status: She is alert and oriented to person, place, and time.  Psychiatric:        Mood and Affect: Mood normal.        Behavior: Behavior normal.     RECENT LABS AND TESTS: BMET    Component Value Date/Time   NA 139 07/21/2018 1035   K 4.8 07/21/2018 1035   CL 106 07/21/2018 1035   CO2 20 07/21/2018 1035   GLUCOSE 95 07/21/2018 1035   GLUCOSE 89 04/15/2015 1512   BUN 14 07/21/2018 1035   CREATININE 0.85 07/21/2018 1035   CREATININE 0.85 04/15/2015 1512   CALCIUM 9.3 07/21/2018 1035   GFRNONAA 75 07/21/2018 1035   GFRAA 87 07/21/2018 1035   Lab  Results  Component Value Date   HGBA1C 5.6 07/21/2018   HGBA1C 5.8 (H) 02/24/2018   Lab Results  Component Value Date   INSULIN 19.3 07/21/2018   INSULIN  14.1 02/24/2018   CBC    Component Value Date/Time   WBC CANCELED 10/18/2017 1449   WBC 9.6 04/15/2015 1512   RBC 4.66 04/15/2015 1512   HGB 13.6 04/15/2015 1512   HGB 14.1 12/22/2013 1605   HCT 41.8 04/15/2015 1512   PLT 319 04/15/2015 1512   MCV 89.7 04/15/2015 1512   MCH 29.2 04/15/2015 1512   MCHC 32.5 04/15/2015 1512   RDW 14.0 04/15/2015 1512   LYMPHSABS 2.3 08/02/2009 1442   MONOABS 0.6 08/02/2009 1442   EOSABS 0.2 08/02/2009 1442   BASOSABS 0.0 08/02/2009 1442   Iron/TIBC/Ferritin/ %Sat No results found for: IRON, TIBC, FERRITIN, IRONPCTSAT Lipid Panel     Component Value Date/Time   CHOL 166 07/21/2018 1035   TRIG 133 07/21/2018 1035   HDL 44 07/21/2018 1035   CHOLHDL 4.2 10/18/2017 1449   CHOLHDL 4.1 04/15/2015 1512   VLDL 36 (H) 04/15/2015 1512   LDLCALC 95 07/21/2018 1035   Hepatic Function Panel     Component Value Date/Time   PROT 6.6 07/21/2018 1035   ALBUMIN 4.1 07/21/2018 1035   AST 25 07/21/2018 1035   ALT 24 07/21/2018 1035   ALKPHOS 126 (H) 07/21/2018 1035   BILITOT 0.3 07/21/2018 1035      Component Value Date/Time   TSH 3.670 10/18/2017 1449   TSH 3.41 04/15/2015 1512   TSH 3.064 11/20/2012 1544     Ref. Range 07/21/2018 10:35  Vitamin D, 25-Hydroxy Latest Ref Range: 30.0 - 100.0 ng/mL 58.1    OBESITY BEHAVIORAL INTERVENTION VISIT  Today's visit was # 12   Starting weight: 273 lbs Starting date: 02/24/2018 Today's weight : 248 lbs Today's date: 08/06/2018 Total lbs lost to date: 25    08/06/2018  Height 5\' 5"  (1.651 m)  Weight 248 lb (112.5 kg)  BMI (Calculated) 41.27  BLOOD PRESSURE - SYSTOLIC 338  BLOOD PRESSURE - DIASTOLIC 78   Body Fat % 25.0 %  Total Body Water (lbs) 87.8 lbs    ASK: We discussed the diagnosis of obesity with Verdell Carmine Borchardt today and Jessalynn agreed to give [place] permission to discuss obesity behavioral modification therapy today.  ASSESS: Cresta has the diagnosis of obesity and her BMI today is 41.27  Purity is in the action stage of change   ADVISE: Norlene was educated on the multiple health risks of obesity as well as the benefit of weight loss to improve her health. She was advised of the need for long term treatment and the importance of lifestyle modifications to improve her current health and to decrease her risk of future health problems.  AGREE: Multiple dietary modification options and treatment options were discussed and  Jamy agreed to follow the recommendations documented in the above note.  ARRANGE: Kalina was educated on the importance of frequent visits to treat obesity as outlined per CMS and USPSTF guidelines and agreed to schedule her next follow up appointment today.  Corey Skains, am acting as Location manager for [employer]. Owens Shark, DO  I have reviewed the above documentation for accuracy and completeness, and I agree with the above. -Jearld Lesch, DO

## 2018-08-21 ENCOUNTER — Ambulatory Visit (INDEPENDENT_AMBULATORY_CARE_PROVIDER_SITE_OTHER): Payer: BC Managed Care – PPO | Admitting: Bariatrics

## 2018-08-21 ENCOUNTER — Encounter (INDEPENDENT_AMBULATORY_CARE_PROVIDER_SITE_OTHER): Payer: Self-pay | Admitting: Bariatrics

## 2018-08-21 ENCOUNTER — Other Ambulatory Visit: Payer: Self-pay

## 2018-08-21 VITALS — BP 137/84 | HR 90 | Temp 98.5°F | Ht 65.0 in | Wt 248.0 lb

## 2018-08-21 DIAGNOSIS — E8881 Metabolic syndrome: Secondary | ICD-10-CM

## 2018-08-21 DIAGNOSIS — E559 Vitamin D deficiency, unspecified: Secondary | ICD-10-CM

## 2018-08-21 DIAGNOSIS — Z6841 Body Mass Index (BMI) 40.0 and over, adult: Secondary | ICD-10-CM | POA: Diagnosis not present

## 2018-08-21 NOTE — Progress Notes (Signed)
Office: [phone]  /  Fax: [phone]   HPI:   Chief Complaint: OBESITY Savannah [name] is here to discuss her progress with her obesity treatment plan. She is on the Category 3 plan and is following her eating plan approximately 70% of the time. She states she is pedaling 20 minutes 5 times per week. [name]'s weight has remained the same. She is doing well with her water intake and is getting protein with each meal. She states she is struggling with sweets. Her weight is 248 lb (112.5 kg) today and has not lost weight since her last visit. She has lost 25 lbs since starting treatment with [place].  Vitamin D deficiency Savannah [name] has a diagnosis of Vitamin D deficiency. Her last Vitamin D level was reported to be 58.1 on 07/21/2018. She is currently taking Vit D and denies nausea, vomiting or muscle weakness.  Insulin Resistance Savannah [name] has a diagnosis of insulin resistance based on her elevated fasting insulin level >5. Her last A1c was 5.6 on 07/21/2018 and her insulin was 19.3. Although Savannah [name]'s blood glucose readings are still under good control, insulin resistance puts her at greater risk of metabolic syndrome and diabetes. She is not taking metformin currently and continues to work on diet and exercise to decrease risk of diabetes.  ASSESSMENT AND PLAN:  Vitamin D deficiency  Insulin resistance  Class 3 severe obesity with serious comorbidity and body mass index (BMI) of 40.0 to 44.9 in adult, unspecified obesity type ([place])  PLAN:  Vitamin D Deficiency Amry was informed that low Vitamin D levels contributes to fatigue and are associated with obesity, breast, and colon cancer. She was instructed to stop high dose Vitamin D and begin Vitamin D 2,000 IU. She will follow-up for routine testing of Vitamin D, at least 2-3 times per year. She was informed of the risk of over-replacement of Vitamin D and agrees to not increase her dose unless she discusses this with [place] first. [name] agrees  to follow-up with our clinic in 4 weeks.  Insulin Resistance [name] will continue to work on weight loss, exercise, and decreasing simple carbohydrates in her diet to help decrease the risk of diabetes. We dicussed metformin including benefits and risks. She was informed that eating too many simple carbohydrates or too many calories at one sitting increases the likelihood of GI side effects. [name] will decrease carbohydrates, increase protein, and follow-up with [place] as directed to monitor her progress.  I spent > than 50% of the 15 minute visit on counseling as documented in the note.  Obesity Savannah [name] is currently in the action stage of change. As such, her goal is to continue with weight loss efforts. She has agreed to follow the Category 3 plan. [name] will work on meal planning, intentional eating, and increase her water intake. [name] has been instructed to work up to a goal of 150 minutes of combined cardio and strengthening exercise per week for weight loss and overall health benefits. We discussed the following Behavioral Modification Strategies today: increasing lean protein intake, decreasing simple carbohydrates, increasing vegetables, increase H20 intake, decrease eating out, no skipping meals, work on meal planning, easy cooking plans, keeping healthy foods in the home, and planning for success.  [name] has agreed to follow-up with our clinic in 4 weeks. She was informed of the importance of frequent follow-up visits to maximize her success with intensive lifestyle modifications for her multiple health conditions.  ALLERGIES: Allergies  Allergen Reactions  . Dust Mite Extract Other (See Comments)  Environmental allergies    MEDICATIONS: Current Outpatient Medications on File Prior to Visit  Medication Sig Dispense Refill  . acetaminophen (TYLENOL) 500 MG tablet Take 500 mg by mouth as directed.    . calcium carbonate (OSCAL) 1500 (600 Ca) MG TABS tablet Take by mouth 2  (two) times daily with a meal.    . CALCIUM PO Take 1 tablet by mouth daily.     . carvedilol (COREG CR) 10 MG 24 hr capsule Take 10 mg by mouth daily.    . carvedilol (COREG) 3.125 MG tablet Take 1 tablet (3.125 mg total) by mouth 2 (two) times daily. 180 tablet 3  . Cholecalciferol (VITAMIN D3) 1.25 MG (50000 UT) CAPS Take 1 Dose by mouth once a week. 4 capsule 0  . clobetasol cream (TEMOVATE) 0.05 % APPLY A PEA SIZED AMOUNT   TOPICALLY TWO TIMES A DAY  FOR 1-2 WEEKS -NOT FOR    DAILY LONG TERM USE 60 g 0  . cyclobenzaprine (FLEXERIL) 10 MG tablet Take 1 tablet (10 mg total) by mouth 3 (three) times daily as needed for muscle spasms. 30 tablet 1  . diclofenac sodium (VOLTAREN) 1 % GEL Apply topically 4 (four) times daily.    [name] eletriptan (RELPAX) 40 MG tablet Take 1 tablet (40 mg total) by mouth 2 (two) times daily as needed for migraine or headache. 10 tablet 5  . glucosamine-chondroitin 500-400 MG tablet Take 1 tablet by mouth 3 (three) times daily.    [name] HYDROcodone-acetaminophen (NORCO/VICODIN) 5-325 MG tablet Take 1 tablet by mouth as directed.  0  . hyoscyamine (LEVSIN, ANASPAZ) 0.125 MG tablet Take 1 tablet (0.125 mg total) by mouth every 4 (four) hours as needed. 30 tablet 1  . lisinopril (PRINIVIL,ZESTRIL) 10 MG tablet Take 1 tablet (10 mg total) by mouth daily. 90 tablet 3  . methocarbamol (ROBAXIN) 750 MG tablet Take 750 mg by mouth as directed.     . multivitamin-lutein (OCUVITE-LUTEIN) CAPS capsule Take 1 capsule by mouth daily.    . Omega-3 Fatty Acids (FISH OIL PO) Take 1 tablet by mouth daily.     [name] PARoxetine (PAXIL) 10 MG tablet TAKE 1 TABLET EVERY        MORNING. 90 tablet 1  . Potassium 99 MG TABS Take by mouth.    . venlafaxine XR (EFFEXOR-XR) 75 MG 24 hr capsule TAKE 1 CAPSULE DAILY WITH  BREAKFAST 90 capsule 1   No current facility-administered medications on file prior to visit.     PAST MEDICAL HISTORY: Past Medical History:  Diagnosis Date  . Arthritis     bilateral knees, recent LCL sprain-on left knee  . CHF (congestive heart failure) ([place])   . Environmental allergies   . Fatty liver   . Headache(784.0)    migraines-on meds for control, relpax, ketoprophen, vicoden, muscle relaxer  . Hearing loss 2016   High frequency hearing - Bilateral   . Hot flashes   . Joint pain   . Lichen sclerosus   . Lipoma    Right Knee  . Menorrhagia   . Migraines   . Necrobiosis lipoidica   . Osteoarthritis   . Overweight   . Pelvic pain in female   . PONV (postoperative nausea and vomiting)    ponv, slow to awake  . Seasonal allergies   . Sinus problem   . Sprain 10/25/2010   left knee  . Trigeminal pulse   . Trigger finger, right    Middle finger and  right wrist   . Vitamin D deficiency     PAST SURGICAL HISTORY: Past Surgical History:  Procedure Laterality Date  . APPENDECTOMY    . DIAGNOSTIC LAPAROSCOPY  08/2006  . DILATION AND CURETTAGE OF UTERUS  2011   attempted ablation  . FUNCTIONAL ENDOSCOPIC SINUS SURGERY    . ganglion cyst removal    . HYSTEROSCOPY     failed  . LASIK    . LIPOMA EXCISION Right 07/22/2014   Procedure: EXCISION RIGHT THIGH LIPOMA;  Surgeon: Erroll Luna, MD;  Location: [facility];  Service: General;  Laterality: Right;  . ROBOTIC ASSISTED TOTAL HYSTERECTOMY  11/06/10   TLH/RSO  . SALPINGOOPHORECTOMY  11/06/2010   Procedure: SALPINGO OOPHERECTOMY;  Surgeon: Felipa Emory;  Location: [facility];  Service: Gynecology;  Laterality: Right;    SOCIAL HISTORY: Social History   Tobacco Use  . Smoking status: Never Smoker  . Smokeless tobacco: Never Used  Substance Use Topics  . Alcohol use: No    Alcohol/week: 0.0 standard drinks  . Drug use: No    FAMILY HISTORY: Family History  Problem Relation Age of Onset  . Skin cancer Mother   . Migraines Mother   . High blood pressure Mother   . Heart disease Father   . High blood pressure Father   . High Cholesterol Father   . Breast cancer  Maternal Aunt 70  . Breast cancer Paternal Aunt 11  . Migraines Brother    ROS: Review of Systems  Gastrointestinal: Negative for nausea and vomiting.  Musculoskeletal:       Negative for muscle weakness.   PHYSICAL EXAM: Blood pressure 137/84, pulse 90, temperature 98.5 F (36.9 C), temperature source Oral, height 5\' 5"  (3.810 m), weight 248 lb (112.5 kg), last menstrual period 10/14/2010, SpO2 96 %. Body mass index is 41.27 kg/m. Physical Exam Vitals signs reviewed.  Constitutional:      Appearance: Normal appearance. She is obese.  Cardiovascular:     Rate and Rhythm: Normal rate.     Pulses: Normal pulses.  Pulmonary:     Effort: Pulmonary effort is normal.     Breath sounds: Normal breath sounds.  Musculoskeletal: Normal range of motion.  Skin:    General: Skin is warm and dry.  Neurological:     Mental Status: She is alert and oriented to person, place, and time.  Psychiatric:        Behavior: Behavior normal.   RECENT LABS AND TESTS: BMET    Component Value Date/Time   NA 139 07/21/2018 1035   K 4.8 07/21/2018 1035   CL 106 07/21/2018 1035   CO2 20 07/21/2018 1035   GLUCOSE 95 07/21/2018 1035   GLUCOSE 89 04/15/2015 1512   BUN 14 07/21/2018 1035   CREATININE 0.85 07/21/2018 1035   CREATININE 0.85 04/15/2015 1512   CALCIUM 9.3 07/21/2018 1035   GFRNONAA 75 07/21/2018 1035   GFRAA 87 07/21/2018 1035   Lab Results  Component Value Date   HGBA1C 5.6 07/21/2018   HGBA1C 5.8 (H) 02/24/2018   Lab Results  Component Value Date   INSULIN 19.3 07/21/2018   INSULIN 14.1 02/24/2018   CBC    Component Value Date/Time   WBC CANCELED 10/18/2017 1449   WBC 9.6 04/15/2015 1512   RBC 4.66 04/15/2015 1512   HGB 13.6 04/15/2015 1512   HGB 14.1 12/22/2013 1605   HCT 41.8 04/15/2015 1512   PLT 319 04/15/2015 1512   MCV 89.7 04/15/2015  1512   MCH 29.2 04/15/2015 1512   MCHC 32.5 04/15/2015 1512   RDW 14.0 04/15/2015 1512   LYMPHSABS 2.3 08/02/2009 1442    MONOABS 0.6 08/02/2009 1442   EOSABS 0.2 08/02/2009 1442   BASOSABS 0.0 08/02/2009 1442   Iron/TIBC/Ferritin/ %Sat No results found for: IRON, TIBC, FERRITIN, IRONPCTSAT Lipid Panel     Component Value Date/Time   CHOL 166 07/21/2018 1035   TRIG 133 07/21/2018 1035   HDL 44 07/21/2018 1035   CHOLHDL 4.2 10/18/2017 1449   CHOLHDL 4.1 04/15/2015 1512   VLDL 36 (H) 04/15/2015 1512   LDLCALC 95 07/21/2018 1035   Hepatic Function Panel     Component Value Date/Time   PROT 6.6 07/21/2018 1035   ALBUMIN 4.1 07/21/2018 1035   AST 25 07/21/2018 1035   ALT 24 07/21/2018 1035   ALKPHOS 126 (H) 07/21/2018 1035   BILITOT 0.3 07/21/2018 1035      Component Value Date/Time   TSH 3.670 10/18/2017 1449   TSH 3.41 04/15/2015 1512   TSH 3.064 11/20/2012 1544   Results for Godfrey, Yamilett L "[name]" (MRN [id number]) as of 08/21/2018 14:39  Ref. Range 07/21/2018 10:35  Vitamin D, 25-Hydroxy Latest Ref Range: 30.0 - 100.0 ng/mL 58.1   OBESITY BEHAVIORAL INTERVENTION VISIT  Today's visit was #13    Starting weight: 273 lbs Starting date: 02/24/2018 Today's weight: 248 lbs  Today's date: 08/21/2018 Total lbs lost to date: 25   08/21/2018  Height 5\' 5"  (1.651 m)  Weight 248 lb (112.5 kg)  BMI (Calculated) 41.27  BLOOD PRESSURE - SYSTOLIC 786  BLOOD PRESSURE - DIASTOLIC 84   Body Fat % 76.7 %  Total Body Water (lbs) 87.2 lbs   ASK: We discussed the diagnosis of obesity with Verdell Carmine Kulig today and Venus agreed to give [place] permission to discuss obesity behavioral modification therapy today.  ASSESS: Kayani has the diagnosis of obesity and her BMI today is 41.3. Janese is in the action stage of change.   ADVISE: Tajha was educated on the multiple health risks of obesity as well as the benefit of weight loss to improve her health. She was advised of the need for long term treatment and the importance of lifestyle modifications to improve her current health and to decrease her risk of  future health problems.  AGREE: Multiple dietary modification options and treatment options were discussed and  Jaloni agreed to follow the recommendations documented in the above note.  ARRANGE: Trameka was educated on the importance of frequent visits to treat obesity as outlined per CMS and USPSTF guidelines and agreed to schedule her next follow up appointment today.  Migdalia Dk, am acting as Location manager for [employer], DO  I have reviewed the above documentation for accuracy and completeness, and I agree with the above. -Jearld Lesch, DO

## 2018-08-25 ENCOUNTER — Encounter (INDEPENDENT_AMBULATORY_CARE_PROVIDER_SITE_OTHER): Payer: Self-pay | Admitting: Bariatrics

## 2018-08-25 NOTE — Progress Notes (Signed)
PATIENT: Savannah [name] DOB: [birth year]  REASON FOR VISIT: follow up HISTORY FROM: patient  HISTORY OF PRESENT ILLNESS: Today 08/26/18  Savannah [name] is a 58 year old female with history of migraine headaches.  She has failed Topamax in the past.  She remains on Effexor 75 mg daily, Relpax as needed. She has had 2 headache in the last 6 weeks. This is a good control for her.  She will take Relpax and the headaches subside. She says triggers are stress and weather. 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 Her overall health has been good since last visit. She is in a weight reduction program. She is now down 25 lbs in the last 6 months. She is having surgery for trigger finger next week, planning left knee replacement if she loses more weight. On average she may take 2 relpax per month. She sees her women's health doctor for primary care.  She presents for follow-up unaccompanied.  HISTORY 11/5/2019CM Savannah [name], 58 year old female returns for follow-up with a history of migraines for many years.  She has failed Topamax in the past.  When last seen her Effexor was increased to 75 mg daily.  This is been beneficial for her since her migraines are due to stress.  Relpax works acutely.  She returns for reevaluation  REVIEW OF SYSTEMS: Out of a complete 14 system review of symptoms, the patient complains only of the following symptoms, and all other reviewed systems are negative.  Headache  ALLERGIES: Allergies  Allergen Reactions  . Dust Mite Extract Other (See Comments)    Environmental allergies    HOME MEDICATIONS: Outpatient Medications Prior to Visit  Medication Sig Dispense Refill  . calcium carbonate (OSCAL) 1500 (600 Ca) MG TABS tablet Take by mouth 2 (two) times daily with a meal.    . carvedilol (COREG CR) 10 MG 24 hr capsule Take 10 mg by mouth daily.    . carvedilol (COREG) 3.125 MG tablet TAKE 1 TABLET TWICE A DAY 180 tablet 3  . Cholecalciferol  (VITAMIN D3) 1.25 MG (50000 UT) CAPS Take 1 Dose by mouth once a week. 4 capsule 0  . clobetasol cream (TEMOVATE) 0.05 % APPLY A PEA SIZED AMOUNT   TOPICALLY TWO TIMES A DAY  FOR 1-2 WEEKS -NOT FOR    DAILY LONG TERM USE 60 g 0  . cyclobenzaprine (FLEXERIL) 10 MG tablet Take 1 tablet (10 mg total) by mouth 3 (three) times daily as needed for muscle spasms. 30 tablet 1  . diclofenac sodium (VOLTAREN) 1 % GEL Apply topically 4 (four) times daily.    [name] glucosamine-chondroitin 500-400 MG tablet Take 1 tablet by mouth 3 (three) times daily.    . hyoscyamine (LEVSIN, ANASPAZ) 0.125 MG tablet Take 1 tablet (0.125 mg total) by mouth every 4 (four) hours as needed. 30 tablet 1  . lisinopril (ZESTRIL) 10 MG tablet TAKE 1 TABLET DAILY 90 tablet 3  . multivitamin-lutein (OCUVITE-LUTEIN) CAPS capsule Take 1 capsule by mouth daily.    . Omega-3 Fatty Acids (FISH OIL PO) Take 1 tablet by mouth daily.     [name] PARoxetine (PAXIL) 10 MG tablet TAKE 1 TABLET EVERY        MORNING. 90 tablet 1  . eletriptan (RELPAX) 40 MG tablet Take 1 tablet (40 mg total) by mouth 2 (two) times daily as needed for migraine or headache. 10 tablet 5  . venlafaxine XR (EFFEXOR-XR) 75 MG 24 hr capsule TAKE  1 CAPSULE DAILY WITH  BREAKFAST 90 capsule 1  . acetaminophen (TYLENOL) 500 MG tablet Take 500 mg by mouth as directed.    . methocarbamol (ROBAXIN) 750 MG tablet Take 750 mg by mouth as directed.     [name] CALCIUM PO Take 1 tablet by mouth daily.     [name] HYDROcodone-acetaminophen (NORCO/VICODIN) 5-325 MG tablet Take 1 tablet by mouth as directed.  0  . Potassium 99 MG TABS Take by mouth.     No facility-administered medications prior to visit.     PAST MEDICAL HISTORY: Past Medical History:  Diagnosis Date  . Arthritis    bilateral knees, recent LCL sprain-on left knee  . CHF (congestive heart failure) ([place])   . Environmental allergies   . Fatty liver   . Headache(784.0)    migraines-on meds for control, relpax, ketoprophen,  vicoden, muscle relaxer  . Hearing loss 2016   High frequency hearing - Bilateral   . Hot flashes   . Joint pain   . Lichen sclerosus   . Lipoma    Right Knee  . Menorrhagia   . Migraines   . Necrobiosis lipoidica   . Osteoarthritis   . Overweight   . Pelvic pain in female   . PONV (postoperative nausea and vomiting)    ponv, slow to awake  . Seasonal allergies   . Sinus problem   . Sprain 10/25/2010   left knee  . Trigeminal pulse   . Trigger finger, right    Middle finger and right wrist   . Vitamin D deficiency     PAST SURGICAL HISTORY: Past Surgical History:  Procedure Laterality Date  . APPENDECTOMY    . DIAGNOSTIC LAPAROSCOPY  08/2006  . DILATION AND CURETTAGE OF UTERUS  2011   attempted ablation  . FUNCTIONAL ENDOSCOPIC SINUS SURGERY    . ganglion cyst removal    . HYSTEROSCOPY     failed  . LASIK    . LIPOMA EXCISION Right 07/22/2014   Procedure: EXCISION RIGHT THIGH LIPOMA;  Surgeon: Erroll Luna, MD;  Location: [facility];  Service: General;  Laterality: Right;  . ROBOTIC ASSISTED TOTAL HYSTERECTOMY  11/06/10   TLH/RSO  . SALPINGOOPHORECTOMY  11/06/2010   Procedure: SALPINGO OOPHERECTOMY;  Surgeon: Felipa Emory;  Location: [facility];  Service: Gynecology;  Laterality: Right;    FAMILY HISTORY: Family History  Problem Relation Age of Onset  . Skin cancer Mother   . Migraines Mother   . High blood pressure Mother   . Heart disease Father   . High blood pressure Father   . High Cholesterol Father   . Breast cancer Maternal Aunt 70  . Breast cancer Paternal Aunt 34  . Migraines Brother     SOCIAL HISTORY: Social History   Socioeconomic History  . Marital status: Married    Spouse name: Savannah [name]  . Number of children: 0  . Years of education: 22  . Highest education level: Not on file  Occupational History  . Occupation: Administrator, [employer] Service  Social Needs  . Financial resource strain: Not on file  . Food insecurity    Worry:  Not on file    Inability: Not on file  . Transportation needs    Medical: Not on file    Non-medical: Not on file  Tobacco Use  . Smoking status: Never Smoker  . Smokeless tobacco: Never Used  Substance and Sexual Activity  . Alcohol use: No    Alcohol/week: 0.0  standard drinks  . Drug use: No  . Sexual activity: Yes    Birth control/protection: Surgical    Comment: TLH/RSO  Lifestyle  . Physical activity    Days per week: Not on file    Minutes per session: Not on file  . Stress: Not on file  Relationships  . Social Herbalist on phone: Not on file    Gets together: Not on file    Attends religious service: Not on file    Active member of club or organization: Not on file    Attends meetings of clubs or organizations: Not on file    Relationship status: Not on file  . Intimate partner violence    Fear of current or ex partner: Not on file    Emotionally abused: Not on file    Physically abused: Not on file    Forced sexual activity: Not on file  Other Topics Concern  . Not on file  Social History Narrative   Lives w/ husband, Eddie Dibbles   Caffeine use: Diet coke girl   Right handed       PHYSICAL EXAM  Vitals:   08/26/18 1451  BP: 126/86  Pulse: 88  Temp: 97.7 F (36.5 C)  Weight: 255 lb 9.6 oz (115.9 kg)  Height: 5\' 5"  (1.651 m)   Body mass index is 42.53 kg/m.  Generalized: Well developed, in no acute distress   Neurological examination  Mentation: Alert oriented to time, place, history taking. Follows all commands speech and language fluent Cranial nerve II-XII: Pupils were equal round reactive to light. Extraocular movements were full, visual field were full on confrontational test. Facial sensation and strength were normal. Uvula tongue midline. Head turning and shoulder shrug  were normal and symmetric. Motor: The motor testing reveals 5 over 5 strength of all 4 extremities. Good symmetric motor tone is noted throughout.  Sensory: Sensory  testing is intact to soft touch on all 4 extremities. No evidence of extinction is noted.  Coordination: Cerebellar testing reveals good finger-nose-finger and heel-to-shin bilaterally.  Gait and station: Gait is normal. Tandem gait is normal.  Reflexes: Deep tendon reflexes are symmetric and normal bilaterally.   DIAGNOSTIC DATA (LABS, IMAGING, TESTING) - I reviewed patient records, labs, notes, testing and imaging myself where available.  Lab Results  Component Value Date   WBC CANCELED 10/18/2017   HGB 13.6 04/15/2015   HCT 41.8 04/15/2015   MCV 89.7 04/15/2015   PLT 319 04/15/2015      Component Value Date/Time   NA 139 07/21/2018 1035   K 4.8 07/21/2018 1035   CL 106 07/21/2018 1035   CO2 20 07/21/2018 1035   GLUCOSE 95 07/21/2018 1035   GLUCOSE 89 04/15/2015 1512   BUN 14 07/21/2018 1035   CREATININE 0.85 07/21/2018 1035   CREATININE 0.85 04/15/2015 1512   CALCIUM 9.3 07/21/2018 1035   PROT 6.6 07/21/2018 1035   ALBUMIN 4.1 07/21/2018 1035   AST 25 07/21/2018 1035   ALT 24 07/21/2018 1035   ALKPHOS 126 (H) 07/21/2018 1035   BILITOT 0.3 07/21/2018 1035   GFRNONAA 75 07/21/2018 1035   GFRAA 87 07/21/2018 1035   Lab Results  Component Value Date   CHOL 166 07/21/2018   HDL 44 07/21/2018   LDLCALC 95 07/21/2018   TRIG 133 07/21/2018   CHOLHDL 4.2 10/18/2017   Lab Results  Component Value Date   HGBA1C 5.6 07/21/2018   No results found for: [id number] Lab Results  Component Value Date   TSH 3.670 10/18/2017      ASSESSMENT AND PLAN 58 y.o. year old female  has a past medical history of Arthritis, CHF (congestive heart failure) ([place]), Environmental allergies, Fatty liver, Headache(784.0), Hearing loss (2016), Hot flashes, Joint pain, Lichen sclerosus, Lipoma, Menorrhagia, Migraines, Necrobiosis lipoidica, Osteoarthritis, Overweight, Pelvic pain in female, PONV (postoperative nausea and vomiting), Seasonal allergies, Sinus problem, Sprain (10/25/2010),  Trigeminal pulse, Trigger finger, right, and Vitamin D deficiency. here with:  1.  Migraine headaches  She indicates good control of her migraine headaches.  She will continue taking Effexor, Relpax as needed.  She will follow-up in 1 year or sooner if needed.  I advised that if her symptoms worsen or she develops any new symptoms she should let us know.  I have sent in refills of Effexor and Relpax.   I spent 15 minutes with the patient. 50% of this time was spent discussing her plan of care.   Butler Denmark, AGNP-C, DNP 08/26/2018, 3:31 PM [facility] [address], [place], [place] [address] [phone]

## 2018-08-26 ENCOUNTER — Other Ambulatory Visit: Payer: Self-pay

## 2018-08-26 ENCOUNTER — Ambulatory Visit: Payer: BC Managed Care – PPO | Admitting: Adult Health

## 2018-08-26 ENCOUNTER — Encounter: Payer: Self-pay | Admitting: Neurology

## 2018-08-26 ENCOUNTER — Ambulatory Visit: Payer: BC Managed Care – PPO | Admitting: Neurology

## 2018-08-26 ENCOUNTER — Other Ambulatory Visit: Payer: Self-pay | Admitting: Cardiovascular Disease

## 2018-08-26 VITALS — BP 126/86 | HR 88 | Temp 97.7°F | Ht 65.0 in | Wt 255.6 lb

## 2018-08-26 DIAGNOSIS — G43909 Migraine, unspecified, not intractable, without status migrainosus: Secondary | ICD-10-CM | POA: Diagnosis not present

## 2018-08-26 MED ORDER — VENLAFAXINE HCL ER 75 MG PO CP24: ORAL_CAPSULE | ORAL | 3 refills | Status: DC

## 2018-08-26 MED ORDER — ELETRIPTAN HYDROBROMIDE 40 MG PO TABS: 40.0000 mg | ORAL_TABLET | Freq: Two times a day (BID) | ORAL | 5 refills | Status: DC | PRN

## 2018-08-26 NOTE — Progress Notes (Signed)
I have read the note, and I agree with the clinical assessment and plan.  Savannah [name]   

## 2018-08-28 ENCOUNTER — Other Ambulatory Visit: Payer: Self-pay | Admitting: Physician Assistant

## 2018-08-28 DIAGNOSIS — M65311 Trigger thumb, right thumb: Secondary | ICD-10-CM | POA: Diagnosis not present

## 2018-08-28 MED ORDER — HYDROCODONE-ACETAMINOPHEN 5-325 MG PO TABS: 1.0000 | ORAL_TABLET | Freq: Four times a day (QID) | ORAL | 0 refills | Status: DC | PRN

## 2018-09-05 ENCOUNTER — Telehealth: Payer: Self-pay | Admitting: *Deleted

## 2018-09-05 NOTE — Telephone Encounter (Signed)
Pt has answered NO to the following covid19 prescreen questions for upcoming appt 09/08/2018      COVID-19 Pre-Screening Questions:  . In the past 7 to 10 days have you had a cough,  shortness of breath, headache, congestion, fever (100 or greater) body aches, chills, sore throat, or sudden loss of taste or sense of smell? . Have you been around anyone with known Covid 19. . Have you been around anyone who is awaiting Covid 19 test results in the past 7 to 10 days? . Have you been around anyone who has been exposed to Covid 19, or has mentioned symptoms of Covid 19 within the past 7 to 10 days?  If you have any concerns/questions about symptoms patients report during screening (either on the phone or at threshold). Contact the provider seeing the patient or DOD for further guidance.  If neither are available contact a member of the leadership team.

## 2018-09-08 ENCOUNTER — Other Ambulatory Visit: Payer: Self-pay

## 2018-09-08 ENCOUNTER — Ambulatory Visit (INDEPENDENT_AMBULATORY_CARE_PROVIDER_SITE_OTHER): Payer: BC Managed Care – PPO | Admitting: Cardiology

## 2018-09-08 ENCOUNTER — Encounter: Payer: Self-pay | Admitting: Cardiology

## 2018-09-08 VITALS — BP 102/80 | HR 115 | Ht 65.0 in | Wt 255.0 lb

## 2018-09-08 DIAGNOSIS — I5022 Chronic systolic (congestive) heart failure: Secondary | ICD-10-CM

## 2018-09-08 DIAGNOSIS — R079 Chest pain, unspecified: Secondary | ICD-10-CM | POA: Diagnosis not present

## 2018-09-08 DIAGNOSIS — I420 Dilated cardiomyopathy: Secondary | ICD-10-CM

## 2018-09-08 DIAGNOSIS — R000 Tachycardia, unspecified: Secondary | ICD-10-CM

## 2018-09-08 NOTE — Patient Instructions (Addendum)
Medication Instructions:  Your physician recommends that you continue on your current medications as directed. Please refer to the Current Medication list given to you today.  If you need a refill on your cardiac medications before your next appointment, please call your pharmacy.   Lab work: NONE If you have labs (blood work) drawn today and your tests are completely normal, you will receive your results only by: [name] MyChart Message (if you have MyChart) OR . A paper copy in the mail If you have any lab test that is abnormal or we need to change your treatment, we will call you to review the results.  Testing/Procedures: Your physician has requested that you have a lexiscan myoview. For further information please visit [email or web address]. Please follow instruction sheet, as given.  Follow-Up: At [facility], you and your health needs are our priority.  As part of our continuing mission to provide you with exceptional heart care, we have created designated Provider Care Teams.  These Care Teams include your primary Cardiologist (physician) and Advanced Practice Providers (APPs -  Physician Assistants and Nurse Practitioners) who all work together to provide you with the care you need, when you need it. You will need a follow up appointment in 12 months.  Please call our office 2 months in advance to schedule this appointment.  You may see Jenkins Rouge, MD or one of the following Advanced Practice Providers on your designated Care Team:   Truitt Merle, NP Cecilie Kicks, NP . Kathyrn Drown, NP  Any Other Special Instructions Will Be Listed Below (If Applicable).

## 2018-09-08 NOTE — Progress Notes (Signed)
Cardiology Office Note   Date:  09/08/2018   ID:  Savannah [name], DOB October 19, 1960, MRN [id number]  PCP:  Lawerance Cruel, MD  Cardiologist:  Dr. Johnsie Cancel     Chief Complaint  Patient presents with  . Cardiomyopathy      History of Present Illness: Savannah [name] is a 58 y.o. female who presents for PVCs   She a history of PVC's. Asymptomatic. No history of CAD.  Uses Relpax for migraines. Had arthroscopuc knee surgery in 5956 with no complications Also had shingles along her ribs.   Myovue done 06/23/15   The left ventricular ejection fraction is moderately decreased (30-44%).  Nuclear stress EF: 43%.  There was no ST segment deviation noted during stress.  This is an intermediate risk study.  F/U echo  05/01/16 reviewed EF stable 40-45% mild MR AV sclerosis mild LAE   Echo 09/10/17 with EF 45-50%, G1DD.  Trivial MR, LA normal, EF is improved   Started on ACE and beta blocker  Not sure but thinks one of them is causing Insomnia we changed cozaar to lisinopril to see if this is the issue  Better but still with some sleep disturbance  Previous Long discussion with Dr. 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    Now going to wt loss through [facility] she has lost 25 lbs.  Doing well. She is planning on Lt knee surgery.  Her insomnia is still present but improved on lisinopril from cozaar.  Still up all night at times. To discuss with PCP.   She has been having Lt ant chest pain without radiation or SOB,  No nausea.  In 2017 her nuc study was neg.  Her HR today is 107 on EKG and on arrival 115.  Usually runs in the 90s.     Recent trigger finger release and did well.    Past Medical History:  Diagnosis Date  . Arthritis    bilateral knees, recent LCL sprain-on left knee  . CHF (congestive heart failure) ([place])    . Environmental allergies   . Fatty liver   . Headache(784.0)    migraines-on meds for control, relpax, ketoprophen, vicoden, muscle relaxer  . Hearing loss 2016   High frequency hearing - Bilateral   . Hot flashes   . Joint pain   . Lichen sclerosus   . Lipoma    Right Knee  . Menorrhagia   . Migraines   . Necrobiosis lipoidica   . Osteoarthritis   . Overweight   . Pelvic pain in female   . PONV (postoperative nausea and vomiting)    ponv, slow to awake  . Seasonal allergies   . Sinus problem   . Sprain 10/25/2010   left knee  . Trigeminal pulse   . Trigger finger, right    Middle finger and right wrist   . Vitamin D deficiency     Past Surgical History:  Procedure Laterality Date  . APPENDECTOMY    . DIAGNOSTIC LAPAROSCOPY  08/2006  . DILATION AND CURETTAGE OF UTERUS  2011   attempted ablation  . FUNCTIONAL ENDOSCOPIC SINUS SURGERY    . ganglion cyst removal    . HYSTEROSCOPY     failed  . LASIK    . LIPOMA EXCISION Right 07/22/2014   Procedure: EXCISION  RIGHT THIGH LIPOMA;  Surgeon: Erroll Luna, MD;  Location: [place];  Service: General;  Laterality: Right;  . ROBOTIC ASSISTED TOTAL HYSTERECTOMY  11/06/10   TLH/RSO  . SALPINGOOPHORECTOMY  11/06/2010   Procedure: SALPINGO OOPHERECTOMY;  Surgeon: Felipa Emory;  Location: [facility];  Service: Gynecology;  Laterality: Right;     Current Outpatient Medications  Medication Sig Dispense Refill  . calcium carbonate (OSCAL) 1500 (600 Ca) MG TABS tablet Take by mouth 2 (two) times daily with a meal.    . carvedilol (COREG) 3.125 MG tablet TAKE 1 TABLET TWICE A DAY 180 tablet 3  . clobetasol cream (TEMOVATE) 0.05 % APPLY A PEA SIZED AMOUNT   TOPICALLY TWO TIMES A DAY  FOR 1-2 WEEKS -NOT FOR    DAILY LONG TERM USE 60 g 0  . cyclobenzaprine (FLEXERIL) 10 MG tablet Take 1 tablet (10 mg total) by mouth 3 (three) times daily as needed for muscle spasms. 30 tablet 1  . diclofenac sodium (VOLTAREN) 1 % GEL  Apply topically 4 (four) times daily.    [name] eletriptan (RELPAX) 40 MG tablet Take 1 tablet (40 mg total) by mouth 2 (two) times daily as needed for migraine or headache. 10 tablet 5  . glucosamine-chondroitin 500-400 MG tablet Take 1 tablet by mouth 3 (three) times daily.    [name] HYDROcodone-acetaminophen (NORCO) 5-325 MG tablet Take 1-2 tablets by mouth every 6 (six) hours as needed for moderate pain. 30 tablet 0  . hyoscyamine (LEVSIN, ANASPAZ) 0.125 MG tablet Take 1 tablet (0.125 mg total) by mouth every 4 (four) hours as needed. 30 tablet 1  . lisinopril (ZESTRIL) 10 MG tablet TAKE 1 TABLET DAILY 90 tablet 3  . multivitamin-lutein (OCUVITE-LUTEIN) CAPS capsule Take 1 capsule by mouth daily.    . Omega-3 Fatty Acids (FISH OIL PO) Take 1 tablet by mouth daily.     [name] PARoxetine (PAXIL) 10 MG tablet TAKE 1 TABLET EVERY        MORNING. 90 tablet 1  . venlafaxine XR (EFFEXOR-XR) 75 MG 24 hr capsule TAKE 1 CAPSULE DAILY WITH  BREAKFAST 90 capsule 3   No current facility-administered medications for this visit.     Allergies:   Dust mite extract    Social History:  The patient  reports that she has never smoked. She has never used smokeless tobacco. She reports that she does not drink alcohol or use drugs.   Family History:  The patient's family history includes Breast cancer (age of onset: 70) in her maternal aunt and paternal aunt; Heart disease in her father; High Cholesterol in her father; High blood pressure in her father and mother; Migraines in her brother and mother; Skin cancer in her mother.    ROS:  General:no colds or fevers, no weight changes + wt loss  Skin:no rashes or ulcers HEENT:no blurred vision, no congestion CV:see HPI PUL:see HPI GI:no diarrhea constipation or melena, no indigestion GU:no hematuria, no dysuria MS:no joint pain, no claudication Neuro:no syncope, no lightheadedness Endo:no diabetes, no thyroid disease  Wt Readings from Last 3 Encounters:  09/08/18 255 lb  (115.7 kg)  08/26/18 255 lb 9.6 oz (115.9 kg)  08/21/18 248 lb (112.5 kg)     PHYSICAL EXAM: VS:  BP 102/80   Pulse (!) 115   Ht 5\' 5"  (1.651 m)   Wt 255 lb (115.7 kg)   LMP 10/14/2010   SpO2 93%   BMI 42.43 kg/m  , BMI Body mass index  is 42.43 kg/m. General:Pleasant affect, NAD Skin:Warm and dry, brisk capillary refill HEENT:normocephalic, sclera clear, mucus membranes moist Neck:supple, no JVD, no bruits  Heart:S1S2 RRR without murmur, gallup, rub or click Lungs:clear without rales, rhonchi, or wheezes DVV:OHYW, non tender, + BS, do not palpate liver spleen or masses Ext:no lower ext edema, 2+ pedal pulses, 2+ radial pulses Neuro:alert and oriented X 3, MAE, follows commands, + facial symmetry    EKG:  EKG is ordered today. The ekg ordered today demonstrates sinus tach at 107 and no ST changes.    Recent Labs: 10/18/2017: TSH 3.670 07/21/2018: ALT 24; BUN 14; Creatinine, Ser 0.85; Potassium 4.8; Sodium 139    Lipid Panel    Component Value Date/Time   CHOL 166 07/21/2018 1035   TRIG 133 07/21/2018 1035   HDL 44 07/21/2018 1035   CHOLHDL 4.2 10/18/2017 1449   CHOLHDL 4.1 04/15/2015 1512   VLDL 36 (H) 04/15/2015 1512   [place] 95 07/21/2018 1035       Other studies Reviewed: Additional studies/ records that were reviewed today include: Improved  Echo 09/10/17 Study Conclusions  - Left ventricle: The cavity size was normal. Systolic function was   mildly reduced. The estimated ejection fraction was in the range   of 45% to 50%. Mild diffuse hypokinesis with no identifiable   regional variations. There was an increased relative contribution   of atrial contraction to ventricular filling. Doppler parameters   are consistent with abnormal left ventricular relaxation (grade 1   diastolic dysfunction). - Aortic valve: Trileaflet; normal thickness, mildly calcified   leaflets.  Impressions:  - Midly reduced LVF with EF 45-50% with grade 1 DD, trivial MR/TR.    ASSESSMENT AND PLAN:  1.  NICM may be due to her PVCs today tachy. But slows with rest. Improved EF   2.  Chest pain, with upcoming knee surgery prior ca+ score 0, will do nuc study.   3.  Lt knee pain, may need surgery soon.   Follow up with Dr. Johnsie Cancel in 1 year.   Current medicines are reviewed with the patient today.  The patient Has no concerns regarding medicines.  The following changes have been made:  See above Labs/ tests ordered today include:see above  Disposition:   FU:  see above  Signed, Cecilie Kicks, NP  09/08/2018 4:01 PM    [facility] [place] [place], [place] Phone: [phone]; Fax: [phone]

## 2018-09-11 ENCOUNTER — Telehealth (HOSPITAL_COMMUNITY): Payer: Self-pay | Admitting: *Deleted

## 2018-09-11 NOTE — Telephone Encounter (Signed)
Patient given detailed instructions per Myocardial Perfusion Study Information Sheet for the test on 09/15/18 at 7:30. Patient notified to arrive 15 minutes early and that it is imperative to arrive on time for appointment to keep from having the test rescheduled.  If you need to cancel or reschedule your appointment, please call the office within 24 hours of your appointment. . Patient verbalized understanding.Savannah [name]

## 2018-09-12 ENCOUNTER — Ambulatory Visit (INDEPENDENT_AMBULATORY_CARE_PROVIDER_SITE_OTHER): Payer: BC Managed Care – PPO | Admitting: Orthopaedic Surgery

## 2018-09-12 DIAGNOSIS — M65331 Trigger finger, right middle finger: Secondary | ICD-10-CM

## 2018-09-12 NOTE — Progress Notes (Signed)
Patient ID: Savannah [name], female   DOB: [birth year], 59 y.o.   MRN: [id number]  [name] is 2 weeks status post right middle trigger finger release.  She's doing well and has no complaints.  No more triggering.  Denies any numbness or tingling.  Surgical incision is healed.  She is not able to fully extend her middle finger but this is as expected for this period of time.  From my standpoint she is doing well.  We will remove the sutures today apply Steri-Strips.  No heavy lifting for another couple weeks.  Recheck in 4 weeks.  Increased activity as tolerated.

## 2018-09-15 ENCOUNTER — Other Ambulatory Visit: Payer: Self-pay

## 2018-09-15 ENCOUNTER — Ambulatory Visit (HOSPITAL_COMMUNITY): Payer: BC Managed Care – PPO | Attending: Cardiology

## 2018-09-15 DIAGNOSIS — R079 Chest pain, unspecified: Secondary | ICD-10-CM | POA: Diagnosis present

## 2018-09-15 LAB — MYOCARDIAL PERFUSION IMAGING
LV dias vol: 97 mL (ref 46–106)
LV sys vol: 47 mL
Peak HR: 111 {beats}/min
Rest HR: 77 {beats}/min
SDS: 0
SRS: 0
SSS: 0
TID: 1.05

## 2018-09-15 MED ORDER — TECHNETIUM TC 99M TETROFOSMIN IV KIT
10.9000 | PACK | Freq: Once | INTRAVENOUS | Status: AC | PRN
  Administered 2018-09-15: 10.9 via INTRAVENOUS
  Filled 2018-09-15: qty 11

## 2018-09-15 MED ORDER — REGADENOSON 0.4 MG/5ML IV SOLN
0.4000 mg | Freq: Once | INTRAVENOUS | Status: AC
  Administered 2018-09-15: 0.4 mg via INTRAVENOUS

## 2018-09-15 MED ORDER — TECHNETIUM TC 99M TETROFOSMIN IV KIT
31.4000 | PACK | Freq: Once | INTRAVENOUS | Status: AC | PRN
  Administered 2018-09-15: 31.4 via INTRAVENOUS
  Filled 2018-09-15: qty 32

## 2018-09-18 ENCOUNTER — Ambulatory Visit (INDEPENDENT_AMBULATORY_CARE_PROVIDER_SITE_OTHER): Payer: BC Managed Care – PPO | Admitting: Bariatrics

## 2018-09-18 ENCOUNTER — Other Ambulatory Visit: Payer: Self-pay

## 2018-09-18 ENCOUNTER — Encounter (INDEPENDENT_AMBULATORY_CARE_PROVIDER_SITE_OTHER): Payer: Self-pay | Admitting: Bariatrics

## 2018-09-18 VITALS — BP 126/82 | HR 99 | Temp 98.4°F | Ht 65.0 in | Wt 251.0 lb

## 2018-09-18 DIAGNOSIS — Z9189 Other specified personal risk factors, not elsewhere classified: Secondary | ICD-10-CM

## 2018-09-18 DIAGNOSIS — Z6841 Body Mass Index (BMI) 40.0 and over, adult: Secondary | ICD-10-CM

## 2018-09-18 DIAGNOSIS — F3289 Other specified depressive episodes: Secondary | ICD-10-CM | POA: Diagnosis not present

## 2018-09-18 DIAGNOSIS — E559 Vitamin D deficiency, unspecified: Secondary | ICD-10-CM

## 2018-09-18 DIAGNOSIS — R7303 Prediabetes: Secondary | ICD-10-CM | POA: Diagnosis not present

## 2018-09-18 MED ORDER — TOPIRAMATE 50 MG PO TABS: 50.0000 mg | ORAL_TABLET | Freq: Every day | ORAL | 0 refills | Status: DC

## 2018-09-18 NOTE — Progress Notes (Signed)
Office: [phone]  /  Fax: [phone]   HPI:   Chief Complaint: OBESITY Savannah [name] is here to discuss her progress with her obesity treatment plan. She is on the Category 3 plan and is following her eating plan approximately 60-70% of the time. She states she is leg pedaling 15-20 minutes 5 times per week. Savannah [name] is up 3 lbs but doing well overall. She had a stress test and increased stress (ate more carbohydrates). She is doing well with her protein. Her weight is 251 lb (113.9 kg) today and has had a weight gain of 3 lbs since her last visit. She has lost 22 lbs since starting treatment with [place].  Vitamin D deficiency Savannah [name] has a diagnosis of Vitamin D deficiency. Her last Vitamin D was 58.1 on 07/21/2018. She is currently taking Vit D and denies nausea, vomiting or muscle weakness.  Pre-Diabetes Savannah [name] has a diagnosis of prediabetes based on her elevated Hgb A1c and was informed this puts her at greater risk of developing diabetes. Her last A1c was 5.6 on 07/21/2018 and insulin 19.3. She is not taking metformin currently and continues to work on diet and exercise to decrease risk of diabetes.   At risk for diabetes Savannah [name] is at higher than average risk for developing diabetes due to her obesity. She currently denies polyuria or polydipsia.  Depression with emotional eating behaviors Savannah [name] is struggling with emotional eating and using food for comfort to the extent that it is negatively impacting her health. She often snacks when she is not hungry. Savannah [name] sometimes feels she is out of control and then feels guilty that she made poor food choices. She has been working on behavior modification techniques to help reduce her emotional eating and has been somewhat successful. Savannah [name] reports increased carb cravings with no contraindications. She shows no sign of suicidal or homicidal ideations.  Depression screen PHQ 2/9 02/24/2018  Decreased Interest 1  Down, Depressed, Hopeless 0   PHQ - 2 Score 1  Altered sleeping 2  Tired, decreased energy 3  Change in appetite 1  Feeling bad or failure about yourself  0  Trouble concentrating 0  Moving slowly or fidgety/restless 0  Suicidal thoughts 0  PHQ-9 Score 7  Difficult doing work/chores Not difficult at all   ASSESSMENT AND PLAN:  Vitamin D deficiency  Prediabetes  Other depression - with emotional eating - Plan: topiramate (TOPAMAX) 50 MG tablet  At risk for diabetes mellitus  Class 3 severe obesity with serious comorbidity and body mass index (BMI) of 40.0 to 44.9 in adult, unspecified obesity type ([place])  PLAN:  Vitamin D Deficiency Savannah [name] was informed that low Vitamin D levels contributes to fatigue and are associated with obesity, breast, and colon cancer. She will take Vit D 2,000 IU daily and will follow-up for routine testing of Vitamin D, at least 2-3 times per year. She was informed of the risk of over-replacement of Vitamin D and agrees to not increase her dose unless she discusses this with [place] first. Savannah [name] agrees to follow-up with our clinic in 2 weeks.  Pre-Diabetes Savannah [name] will continue to work on weight loss, exercise, and decreasing simple carbohydrates in her diet to help decrease the risk of diabetes. We dicussed metformin including benefits and risks. She was informed that eating too many simple carbohydrates or too many calories at one sitting increases the likelihood of GI side effects. Savannah [name] was instructed to decrease carbohydrates, increase protein, and increase exercise. She will follow-up with [place] as  directed to monitor her progress.  Diabetes risk counseling Savannah [name] was given extended (15 minutes) diabetes prevention counseling today. She is 58 y.o. female and has risk factors for diabetes including obesity. We discussed intensive lifestyle modifications today with an emphasis on weight loss as well as increasing exercise and decreasing simple carbohydrates in her diet.  Depression with  Emotional Eating Behaviors We discussed behavior modification techniques today to help Savannah [name] deal with her emotional eating and depression. Savannah [name] was given a prescription for Topamax 50 mg 1 with dinner daily #30 with 0 refills and agrees to follow-up with our clinic in 2 weeks.  Obesity Savannah [name] is currently in the action stage of change. As such, her goal is to continue with weight loss efforts. She has agreed to follow the Category 3 plan. Savannah [name] will work on meal planning, intentional eating, increasing her water intake (water in AM, sip during the day). Savannah [name] has been instructed to continue [place] for weight loss and overall health benefits. We discussed the following Behavioral Modification Strategies today: increasing lean protein intake, decreasing simple carbohydrates, increasing vegetables, increase H20 intake, decrease eating out, no skipping meals, work on meal planning and easy cooking plans, and keeping healthy foods in the home.  Savannah [name] has agreed to follow-up with our clinic in 2 weeks. She was informed of the importance of frequent follow-up visits to maximize her success with intensive lifestyle modifications for her multiple health conditions.  ALLERGIES: Allergies  Allergen Reactions  . Dust Mite Extract Other (See Comments)    Environmental allergies    MEDICATIONS: Current Outpatient Medications on File Prior to Visit  Medication Sig Dispense Refill  . calcium carbonate (OSCAL) 1500 (600 Ca) MG TABS tablet Take by mouth 2 (two) times daily with a meal.    . carvedilol (COREG) 3.125 MG tablet TAKE 1 TABLET TWICE A DAY 180 tablet 3  . clobetasol cream (TEMOVATE) 0.05 % APPLY A PEA SIZED AMOUNT   TOPICALLY TWO TIMES A DAY  FOR 1-2 WEEKS -NOT FOR    DAILY LONG TERM USE 60 g 0  . cyclobenzaprine (FLEXERIL) 10 MG tablet Take 1 tablet (10 mg total) by mouth 3 (three) times daily as needed for muscle spasms. 30 tablet 1  . diclofenac sodium (VOLTAREN) 1 % GEL Apply  topically 4 (four) times daily.    [name] eletriptan (RELPAX) 40 MG tablet Take 1 tablet (40 mg total) by mouth 2 (two) times daily as needed for migraine or headache. 10 tablet 5  . glucosamine-chondroitin 500-400 MG tablet Take 1 tablet by mouth 3 (three) times daily.    [name] HYDROcodone-acetaminophen (NORCO) 5-325 MG tablet Take 1-2 tablets by mouth every 6 (six) hours as needed for moderate pain. 30 tablet 0  . hyoscyamine (LEVSIN, ANASPAZ) 0.125 MG tablet Take 1 tablet (0.125 mg total) by mouth every 4 (four) hours as needed. 30 tablet 1  . lisinopril (ZESTRIL) 10 MG tablet TAKE 1 TABLET DAILY 90 tablet 3  . multivitamin-lutein (OCUVITE-LUTEIN) CAPS capsule Take 1 capsule by mouth daily.    . Omega-3 Fatty Acids (FISH OIL PO) Take 1 tablet by mouth daily.     [name] PARoxetine (PAXIL) 10 MG tablet TAKE 1 TABLET EVERY        MORNING. 90 tablet 1  . venlafaxine XR (EFFEXOR-XR) 75 MG 24 hr capsule TAKE 1 CAPSULE DAILY WITH  BREAKFAST 90 capsule 3   No current facility-administered medications on file prior to visit.     PAST MEDICAL HISTORY: Past  Medical History:  Diagnosis Date  . Arthritis    bilateral knees, recent LCL sprain-on left knee  . CHF (congestive heart failure) ([place])   . Environmental allergies   . Fatty liver   . Headache(784.0)    migraines-on meds for control, relpax, ketoprophen, vicoden, muscle relaxer  . Hearing loss 2016   High frequency hearing - Bilateral   . Hot flashes   . Joint pain   . Lichen sclerosus   . Lipoma    Right Knee  . Menorrhagia   . Migraines   . Necrobiosis lipoidica   . Osteoarthritis   . Overweight   . Pelvic pain in female   . PONV (postoperative nausea and vomiting)    ponv, slow to awake  . Seasonal allergies   . Sinus problem   . Sprain 10/25/2010   left knee  . Trigeminal pulse   . Trigger finger, right    Middle finger and right wrist   . Vitamin D deficiency     PAST SURGICAL HISTORY: Past Surgical History:  Procedure Laterality  Date  . APPENDECTOMY    . DIAGNOSTIC LAPAROSCOPY  08/2006  . DILATION AND CURETTAGE OF UTERUS  2011   attempted ablation  . FUNCTIONAL ENDOSCOPIC SINUS SURGERY    . ganglion cyst removal    . HYSTEROSCOPY     failed  . LASIK    . LIPOMA EXCISION Right 07/22/2014   Procedure: EXCISION RIGHT THIGH LIPOMA;  Surgeon: Erroll Luna, MD;  Location: [facility];  Service: General;  Laterality: Right;  . ROBOTIC ASSISTED TOTAL HYSTERECTOMY  11/06/10   TLH/RSO  . SALPINGOOPHORECTOMY  11/06/2010   Procedure: SALPINGO OOPHERECTOMY;  Surgeon: Felipa Emory;  Location: [facility];  Service: Gynecology;  Laterality: Right;    SOCIAL HISTORY: Social History   Tobacco Use  . Smoking status: Never Smoker  . Smokeless tobacco: Never Used  Substance Use Topics  . Alcohol use: No    Alcohol/week: 0.0 standard drinks  . Drug use: No    FAMILY HISTORY: Family History  Problem Relation Age of Onset  . Skin cancer Mother   . Migraines Mother   . High blood pressure Mother   . Heart disease Father   . High blood pressure Father   . High Cholesterol Father   . Breast cancer Maternal Aunt 70  . Breast cancer Paternal Aunt 57  . Migraines Brother    ROS: Review of Systems  Gastrointestinal: Negative for nausea and vomiting.  Musculoskeletal:       Negative for muscle weakness.  Psychiatric/Behavioral: Positive for depression (emotional eating). Negative for suicidal ideas.       Negative for homicidal ideas.   PHYSICAL EXAM: Blood pressure 126/82, pulse 99, temperature 98.4 F (36.9 C), temperature source Oral, height 5\' 5"  (1.651 m), weight 251 lb (113.9 kg), last menstrual period 10/14/2010, SpO2 93 %. Body mass index is 41.77 kg/m. Physical Exam Vitals signs reviewed.  Constitutional:      Appearance: Normal appearance. She is obese.  Cardiovascular:     Rate and Rhythm: Normal rate.     Pulses: Normal pulses.  Pulmonary:     Effort: Pulmonary effort is normal.      Breath sounds: Normal breath sounds.  Musculoskeletal: Normal range of motion.  Skin:    General: Skin is warm and dry.  Neurological:     Mental Status: She is alert and oriented to person, place, and time.  Psychiatric:  Behavior: Behavior normal.   RECENT LABS AND TESTS: BMET    Component Value Date/Time   NA 139 07/21/2018 1035   K 4.8 07/21/2018 1035   CL 106 07/21/2018 1035   CO2 20 07/21/2018 1035   GLUCOSE 95 07/21/2018 1035   GLUCOSE 89 04/15/2015 1512   BUN 14 07/21/2018 1035   CREATININE 0.85 07/21/2018 1035   CREATININE 0.85 04/15/2015 1512   CALCIUM 9.3 07/21/2018 1035   GFRNONAA 75 07/21/2018 1035   GFRAA 87 07/21/2018 1035   Lab Results  Component Value Date   HGBA1C 5.6 07/21/2018   HGBA1C 5.8 (H) 02/24/2018   Lab Results  Component Value Date   INSULIN 19.3 07/21/2018   INSULIN 14.1 02/24/2018   CBC    Component Value Date/Time   WBC CANCELED 10/18/2017 1449   WBC 9.6 04/15/2015 1512   RBC 4.66 04/15/2015 1512   HGB 13.6 04/15/2015 1512   HGB 14.1 12/22/2013 1605   HCT 41.8 04/15/2015 1512   PLT 319 04/15/2015 1512   MCV 89.7 04/15/2015 1512   MCH 29.2 04/15/2015 1512   MCHC 32.5 04/15/2015 1512   RDW 14.0 04/15/2015 1512   LYMPHSABS 2.3 08/02/2009 1442   MONOABS 0.6 08/02/2009 1442   EOSABS 0.2 08/02/2009 1442   BASOSABS 0.0 08/02/2009 1442   Iron/TIBC/Ferritin/ %Sat No results found for: IRON, TIBC, FERRITIN, IRONPCTSAT Lipid Panel     Component Value Date/Time   CHOL 166 07/21/2018 1035   TRIG 133 07/21/2018 1035   HDL 44 07/21/2018 1035   CHOLHDL 4.2 10/18/2017 1449   CHOLHDL 4.1 04/15/2015 1512   VLDL 36 (H) 04/15/2015 1512   LDLCALC 95 07/21/2018 1035   Hepatic Function Panel     Component Value Date/Time   PROT 6.6 07/21/2018 1035   ALBUMIN 4.1 07/21/2018 1035   AST 25 07/21/2018 1035   ALT 24 07/21/2018 1035   ALKPHOS 126 (H) 07/21/2018 1035   BILITOT 0.3 07/21/2018 1035      Component Value Date/Time   TSH  3.670 10/18/2017 1449   TSH 3.41 04/15/2015 1512   TSH 3.064 11/20/2012 1544   Results for Reinoso, Bruna L "[name]" (MRN [id number]) as of 09/18/2018 11:22  Ref. Range 07/21/2018 10:35  Vitamin D, 25-Hydroxy Latest Ref Range: 30.0 - 100.0 ng/mL 58.1   OBESITY BEHAVIORAL INTERVENTION VISIT  Today's visit was #14   Starting weight: 273 lbs Starting date: 02/24/2018 Today's weight: 251 lbs  Today's date: 09/18/2018 Total lbs lost to date: 22   09/18/2018  Height 5\' 5"  (1.651 m)  Weight 251 lb (113.9 kg)  BMI (Calculated) 41.77  BLOOD PRESSURE - SYSTOLIC 937  BLOOD PRESSURE - DIASTOLIC 82   Body Fat % 16.9 %  Total Body Water (lbs) 88.4 lbs   ASK: We discussed the diagnosis of obesity with Verdell Carmine Gallien today and Savannah agreed to give [place] permission to discuss obesity behavioral modification therapy today.  ASSESS: Cala has the diagnosis of obesity and her BMI today is 41.9. Daziya is in the action stage of change.   ADVISE: Zenita was educated on the multiple health risks of obesity as well as the benefit of weight loss to improve her health. She was advised of the need for long term treatment and the importance of lifestyle modifications to improve her current health and to decrease her risk of future health problems.  AGREE: Multiple dietary modification options and treatment options were discussed and  Tisha agreed to follow the recommendations documented in  the above note.  ARRANGE: Tola was educated on the importance of frequent visits to treat obesity as outlined per CMS and USPSTF guidelines and agreed to schedule her next follow up appointment today.  Migdalia Dk, am acting as Location manager for [employer], DO  I have reviewed the above documentation for accuracy and completeness, and I agree with the above. -Jearld Lesch, DO

## 2018-10-02 ENCOUNTER — Ambulatory Visit (INDEPENDENT_AMBULATORY_CARE_PROVIDER_SITE_OTHER): Payer: BC Managed Care – PPO | Admitting: Bariatrics

## 2018-10-09 ENCOUNTER — Other Ambulatory Visit: Payer: Self-pay

## 2018-10-09 ENCOUNTER — Ambulatory Visit (INDEPENDENT_AMBULATORY_CARE_PROVIDER_SITE_OTHER): Payer: BC Managed Care – PPO | Admitting: Bariatrics

## 2018-10-09 ENCOUNTER — Encounter (INDEPENDENT_AMBULATORY_CARE_PROVIDER_SITE_OTHER): Payer: Self-pay | Admitting: Bariatrics

## 2018-10-09 VITALS — BP 119/81 | HR 104 | Temp 98.4°F | Ht 65.0 in | Wt 252.0 lb

## 2018-10-09 DIAGNOSIS — Z9189 Other specified personal risk factors, not elsewhere classified: Secondary | ICD-10-CM

## 2018-10-09 DIAGNOSIS — Z6841 Body Mass Index (BMI) 40.0 and over, adult: Secondary | ICD-10-CM

## 2018-10-09 DIAGNOSIS — E559 Vitamin D deficiency, unspecified: Secondary | ICD-10-CM

## 2018-10-09 DIAGNOSIS — E8881 Metabolic syndrome: Secondary | ICD-10-CM

## 2018-10-09 DIAGNOSIS — F3289 Other specified depressive episodes: Secondary | ICD-10-CM

## 2018-10-09 MED ORDER — TROKENDI XR 50 MG PO CP24: 50.0000 mg | ORAL_CAPSULE | ORAL | 0 refills | Status: DC

## 2018-10-09 MED ORDER — METFORMIN HCL 500 MG PO TABS: 500.0000 mg | ORAL_TABLET | Freq: Every day | ORAL | 0 refills | Status: DC

## 2018-10-10 ENCOUNTER — Encounter: Payer: Self-pay | Admitting: Orthopaedic Surgery

## 2018-10-10 ENCOUNTER — Ambulatory Visit (INDEPENDENT_AMBULATORY_CARE_PROVIDER_SITE_OTHER): Payer: BC Managed Care – PPO | Admitting: Orthopaedic Surgery

## 2018-10-10 DIAGNOSIS — M65331 Trigger finger, right middle finger: Secondary | ICD-10-CM

## 2018-10-10 MED ORDER — NAPROXEN 500 MG PO TABS: 500.0000 mg | ORAL_TABLET | Freq: Two times a day (BID) | ORAL | 3 refills | Status: DC

## 2018-10-10 NOTE — Progress Notes (Signed)
Patient ID: [name], female   DOB: 1960/03/31, 58 y.o.   MRN: [id number]  Savannah [name] returns today for her 6-week postoperative visit status post right long trigger finger release.  She states overall she is doing well.  She has not experienced any more triggering.  She just has some soreness occasionally.  She does notice some residual soreness and stiffness in the PIP and DIP joint.  She is not aware of any arthritis or any history of gout.  Physical exam shows a fully healed surgical scar that is nontender.  She can make a full composite fist.  She does have some mild swelling of most notably the PIP joint.  Impression is 6 weeks status post right long trigger finger release doing well from this.  She continues to have some residual swelling in the finger which could be postsurgical versus osteoarthritis.  I sent in naproxen as well as recommend over-the-counter Voltaren gel for the finger which should help with the soreness and swelling.  Patient can follow-up with [place] as needed.

## 2018-10-13 NOTE — Progress Notes (Signed)
Office: [phone]  /  Fax: [phone]   HPI:   Chief Complaint: OBESITY Savannah [name] is here to discuss her progress with her obesity treatment plan. She is on the Category 3 plan and is following her eating plan approximately 60 % of the time. She states she is pedaling 10 minutes 5 times per week. Savannah [name] is up 1 pound, but she has done well overall. Savannah [name] is struggling with wanting more carbohydrates. Her weight is 252 lb (114.3 kg) today and has had a weight gain of 1 pound over a period of 3 weeks since her last visit. She has lost 21 lbs since starting treatment with [place].  Vitamin D deficiency Savannah [name] has a diagnosis of vitamin D deficiency. She is currently taking vit D and her last vitamin D level was at 58.1. She denies nausea, vomiting or muscle weakness.  Insulin Resistance Savannah [name] has a diagnosis of insulin resistance based on her elevated fasting insulin level >5. Although Savannah [name]'s blood glucose readings are still under good control, insulin resistance puts her at greater risk of metabolic syndrome and diabetes. Her last A1c was at 5.6 and last insulin level was at 19.3 She is not taking metformin currently and continues to work on diet and exercise to decrease risk of diabetes.  At risk for diabetes Savannah [name] is at higher than average risk for developing diabetes due to her obesity and insulin resistance. She currently denies polyuria or polydipsia.  Depression with emotional eating behaviors Savannah [name] has increased carbohydrates. She is taking Paxil and Effexor. Savannah [name] started on Topamax (making sleepy). Savannah [name] struggles with emotional eating and using food for comfort to the extent that it is negatively impacting her health. She often snacks when she is not hungry. Savannah [name] sometimes feels she is out of control and then feels guilty that she made poor food choices. She has been working on behavior modification techniques to help reduce her emotional eating and has been somewhat  successful. She shows no sign of suicidal or homicidal ideations.  ASSESSMENT AND PLAN:  Vitamin D deficiency  Insulin resistance - Plan: metFORMIN (GLUCOPHAGE) 500 MG tablet  Other depression - with emotional eating - Plan: Topiramate ER (TROKENDI XR) 50 MG CP24  At risk for diabetes mellitus  Class 3 severe obesity with serious comorbidity and body mass index (BMI) of 40.0 to 44.9 in adult, unspecified obesity type ([place])  PLAN:  Vitamin D Deficiency Savannah [name] was informed that low vitamin D levels contributes to fatigue and are associated with obesity, breast, and colon cancer. Savannah [name] will continue OTC vitamin D3 and she will follow up for routine testing of vitamin D, at least 2-3 times per year. She was informed of the risk of over-replacement of vitamin D and agrees to not increase her dose unless she discusses this with [place] first. Savannah [name] agrees to follow up as directed.  Insulin Resistance Savannah [name] will continue to work on weight loss, exercise, and decreasing simple carbohydrates in her diet to help decrease the risk of diabetes. We dicussed metformin including benefits and risks. She was informed that eating too many simple carbohydrates or too many calories at one sitting increases the likelihood of GI side effects. Savannah [name] agrees to start metformin 500 mg once daily at noon #30 with no refills and follow up with [place] as directed to monitor her progress.  Diabetes risk counseling Savannah [name] was given extended (15 minutes) diabetes prevention counseling today. She is 58 y.o. female and has risk factors for diabetes including obesity and insulin resistance.  We discussed intensive lifestyle modifications today with an emphasis on weight loss as well as increasing exercise and decreasing simple carbohydrates in her diet.  Depression with Emotional Eating Behaviors We discussed behavior modification techniques today to help Savannah [name] deal with her emotional eating and depression. She has agreed  to stop Topamax and begin Trokendi 50 mg once daily in the morning #30 with no refills and follow up as directed.  Obesity Savannah [name] is currently in the action stage of change. As such, her goal is to continue with weight loss efforts She has agreed to follow the Category 3 plan Savannah [name] has been instructed to work up to a goal of 150 minutes of combined cardio and strengthening exercise per week for weight loss and overall health benefits. We discussed the following Behavioral Modification Strategies today: planning for success, increase H2O intake, no skipping meals, keeping healthy foods in the home, increasing lean protein intake, decreasing simple carbohydrates, increasing vegetables, decrease eating out and work on meal planning and intentional eating Savannah [name] will increase protein for breakfast (30 grams).  Savannah [name] has agreed to follow up with our clinic in 3 weeks. She was informed of the importance of frequent follow up visits to maximize her success with intensive lifestyle modifications for her multiple health conditions.  ALLERGIES: Allergies  Allergen Reactions   Dust Mite Extract Other (See Comments)    Environmental allergies    MEDICATIONS: Current Outpatient Medications on File Prior to Visit  Medication Sig Dispense Refill   calcium carbonate (OSCAL) 1500 (600 Ca) MG TABS tablet Take by mouth 2 (two) times daily with a meal.     carvedilol (COREG) 3.125 MG tablet TAKE 1 TABLET TWICE A DAY 180 tablet 3   clobetasol cream (TEMOVATE) 0.05 % APPLY A PEA SIZED AMOUNT   TOPICALLY TWO TIMES A DAY  FOR 1-2 WEEKS -NOT FOR    DAILY LONG TERM USE 60 g 0   cyclobenzaprine (FLEXERIL) 10 MG tablet Take 1 tablet (10 mg total) by mouth 3 (three) times daily as needed for muscle spasms. 30 tablet 1   diclofenac sodium (VOLTAREN) 1 % GEL Apply topically 4 (four) times daily.     eletriptan (RELPAX) 40 MG tablet Take 1 tablet (40 mg total) by mouth 2 (two) times daily as needed for  migraine or headache. 10 tablet 5   glucosamine-chondroitin 500-400 MG tablet Take 1 tablet by mouth 3 (three) times daily.     HYDROcodone-acetaminophen (NORCO) 5-325 MG tablet Take 1-2 tablets by mouth every 6 (six) hours as needed for moderate pain. 30 tablet 0   hyoscyamine (LEVSIN, ANASPAZ) 0.125 MG tablet Take 1 tablet (0.125 mg total) by mouth every 4 (four) hours as needed. 30 tablet 1   lisinopril (ZESTRIL) 10 MG tablet TAKE 1 TABLET DAILY 90 tablet 3   multivitamin-lutein (OCUVITE-LUTEIN) CAPS capsule Take 1 capsule by mouth daily.     Omega-3 Fatty Acids (FISH OIL PO) Take 1 tablet by mouth daily.      PARoxetine (PAXIL) 10 MG tablet TAKE 1 TABLET EVERY        MORNING. 90 tablet 1   venlafaxine XR (EFFEXOR-XR) 75 MG 24 hr capsule TAKE 1 CAPSULE DAILY WITH  BREAKFAST 90 capsule 3   No current facility-administered medications on file prior to visit.     PAST MEDICAL HISTORY: Past Medical History:  Diagnosis Date   Arthritis    bilateral knees, recent LCL sprain-on left knee   CHF (congestive heart failure) ([place])  Environmental allergies    Fatty liver    Headache(784.0)    migraines-on meds for control, relpax, ketoprophen, vicoden, muscle relaxer   Hearing loss 2016   High frequency hearing - Bilateral    Hot flashes    Joint pain    Lichen sclerosus    Lipoma    Right Knee   Menorrhagia    Migraines    Necrobiosis lipoidica    Osteoarthritis    Overweight    Pelvic pain in female    PONV (postoperative nausea and vomiting)    ponv, slow to awake   Seasonal allergies    Sinus problem    Sprain 10/25/2010   left knee   Trigeminal pulse    Trigger finger, right    Middle finger and right wrist    Vitamin D deficiency     PAST SURGICAL HISTORY: Past Surgical History:  Procedure Laterality Date   APPENDECTOMY     DIAGNOSTIC LAPAROSCOPY  08/2006   DILATION AND CURETTAGE OF UTERUS  2011   attempted ablation   FUNCTIONAL  ENDOSCOPIC SINUS SURGERY     ganglion cyst removal     HYSTEROSCOPY     failed   LASIK     LIPOMA EXCISION Right 07/22/2014   Procedure: EXCISION RIGHT THIGH LIPOMA;  Surgeon: Erroll Luna, MD;  Location: [facility];  Service: General;  Laterality: Right;   ROBOTIC ASSISTED TOTAL HYSTERECTOMY  11/06/10   TLH/RSO   SALPINGOOPHORECTOMY  11/06/2010   Procedure: SALPINGO OOPHERECTOMY;  Surgeon: Felipa Emory;  Location: [facility];  Service: Gynecology;  Laterality: Right;    SOCIAL HISTORY: Social History   Tobacco Use   Smoking status: Never Smoker   Smokeless tobacco: Never Used  Substance Use Topics   Alcohol use: No    Alcohol/week: 0.0 standard drinks   Drug use: No    FAMILY HISTORY: Family History  Problem Relation Age of Onset   Skin cancer Mother    Migraines Mother    High blood pressure Mother    Heart disease Father    High blood pressure Father    High Cholesterol Father    Breast cancer Maternal Aunt 56   Breast cancer Paternal Aunt 78   Migraines Brother     ROS: Review of Systems  Constitutional: Negative for weight loss.  Gastrointestinal: Negative for nausea and vomiting.  Genitourinary: Negative for frequency.  Musculoskeletal:       Negative for muscle weakness  Endo/Heme/Allergies: Negative for polydipsia.       Positive for polyphagia  Psychiatric/Behavioral: Positive for depression. Negative for suicidal ideas.    PHYSICAL EXAM: Blood pressure 119/81, pulse (!) 104, temperature 98.4 F (36.9 C), temperature source Oral, height 5\' 5"  (1.651 m), weight 252 lb (114.3 kg), last menstrual period 10/14/2010, SpO2 98 %. Body mass index is 41.93 kg/m. Physical Exam Vitals signs reviewed.  Constitutional:      Appearance: Normal appearance. She is well-developed. She is obese.  Cardiovascular:     Rate and Rhythm: Normal rate.  Pulmonary:     Effort: Pulmonary effort is normal.  Musculoskeletal: Normal range of  motion.  Skin:    General: Skin is warm and dry.  Neurological:     Mental Status: She is alert and oriented to person, place, and time.  Psychiatric:        Mood and Affect: Mood normal.        Behavior: Behavior normal.  Thought Content: Thought content does not include homicidal or suicidal ideation.     RECENT LABS AND TESTS: BMET    Component Value Date/Time   NA 139 07/21/2018 1035   K 4.8 07/21/2018 1035   CL 106 07/21/2018 1035   CO2 20 07/21/2018 1035   GLUCOSE 95 07/21/2018 1035   GLUCOSE 89 04/15/2015 1512   BUN 14 07/21/2018 1035   CREATININE 0.85 07/21/2018 1035   CREATININE 0.85 04/15/2015 1512   CALCIUM 9.3 07/21/2018 1035   GFRNONAA 75 07/21/2018 1035   GFRAA 87 07/21/2018 1035   Lab Results  Component Value Date   HGBA1C 5.6 07/21/2018   HGBA1C 5.8 (H) 02/24/2018   Lab Results  Component Value Date   INSULIN 19.3 07/21/2018   INSULIN 14.1 02/24/2018   CBC    Component Value Date/Time   WBC CANCELED 10/18/2017 1449   WBC 9.6 04/15/2015 1512   RBC 4.66 04/15/2015 1512   HGB 13.6 04/15/2015 1512   HGB 14.1 12/22/2013 1605   HCT 41.8 04/15/2015 1512   PLT 319 04/15/2015 1512   MCV 89.7 04/15/2015 1512   MCH 29.2 04/15/2015 1512   MCHC 32.5 04/15/2015 1512   RDW 14.0 04/15/2015 1512   LYMPHSABS 2.3 08/02/2009 1442   MONOABS 0.6 08/02/2009 1442   EOSABS 0.2 08/02/2009 1442   BASOSABS 0.0 08/02/2009 1442   Iron/TIBC/Ferritin/ %Sat No results found for: IRON, TIBC, FERRITIN, IRONPCTSAT Lipid Panel     Component Value Date/Time   CHOL 166 07/21/2018 1035   TRIG 133 07/21/2018 1035   HDL 44 07/21/2018 1035   CHOLHDL 4.2 10/18/2017 1449   CHOLHDL 4.1 04/15/2015 1512   VLDL 36 (H) 04/15/2015 1512   LDLCALC 95 07/21/2018 1035   Hepatic Function Panel     Component Value Date/Time   PROT 6.6 07/21/2018 1035   ALBUMIN 4.1 07/21/2018 1035   AST 25 07/21/2018 1035   ALT 24 07/21/2018 1035   ALKPHOS 126 (H) 07/21/2018 1035   BILITOT  0.3 07/21/2018 1035      Component Value Date/Time   TSH 3.670 10/18/2017 1449   TSH 3.41 04/15/2015 1512   TSH 3.064 11/20/2012 1544     Ref. Range 07/21/2018 10:35  Vitamin D, 25-Hydroxy Latest Ref Range: 30.0 - 100.0 ng/mL 58.1    OBESITY BEHAVIORAL INTERVENTION VISIT  Today's visit was # 15   Starting weight: 273 lbs Starting date: 02/24/2018 Today's weight : 252 lbs Today's date: 10/09/2018 Total lbs lost to date: 21    10/09/2018  Height 5\' 5"  (1.651 m)  Weight 252 lb (114.3 kg)  BMI (Calculated) 41.93  BLOOD PRESSURE - SYSTOLIC [id number]  BLOOD PRESSURE - DIASTOLIC 81   Body Fat % 0000000 %  Total Body Water (lbs) 87.8 lbs    ASK: We discussed the diagnosis of obesity with Savannah [name] today and Savannah [name] agreed to give [place] permission to discuss obesity behavioral modification therapy today.  ASSESS: Savannah [name] has the diagnosis of obesity and her BMI today is 41.93 Savannah [name] is in the action stage of change   ADVISE: Caitlynne was educated on the multiple health risks of obesity as well as the benefit of weight loss to improve her health. She was advised of the need for long term treatment and the importance of lifestyle modifications to improve her current health and to decrease her risk of future health problems.  AGREE: Multiple dietary modification options and treatment options were discussed and  Savannah [name] agreed to follow the  recommendations documented in the above note.  ARRANGE: Katty was educated on the importance of frequent visits to treat obesity as outlined per CMS and USPSTF guidelines and agreed to schedule her next follow up appointment today.  Corey Skains, am acting as Location manager for [employer]. Owens Shark, DO  I have reviewed the above documentation for accuracy and completeness, and I agree with the above. -Jearld Lesch, DO

## 2018-10-14 ENCOUNTER — Encounter (INDEPENDENT_AMBULATORY_CARE_PROVIDER_SITE_OTHER): Payer: Self-pay | Admitting: Bariatrics

## 2018-10-30 ENCOUNTER — Encounter (INDEPENDENT_AMBULATORY_CARE_PROVIDER_SITE_OTHER): Payer: Self-pay | Admitting: Bariatrics

## 2018-10-30 ENCOUNTER — Other Ambulatory Visit: Payer: Self-pay

## 2018-10-30 ENCOUNTER — Ambulatory Visit (INDEPENDENT_AMBULATORY_CARE_PROVIDER_SITE_OTHER): Payer: BC Managed Care – PPO | Admitting: Bariatrics

## 2018-10-30 VITALS — BP 116/77 | HR 82 | Temp 98.8°F | Ht 65.0 in | Wt 252.0 lb

## 2018-10-30 DIAGNOSIS — F3289 Other specified depressive episodes: Secondary | ICD-10-CM

## 2018-10-30 DIAGNOSIS — Z9189 Other specified personal risk factors, not elsewhere classified: Secondary | ICD-10-CM | POA: Diagnosis not present

## 2018-10-30 DIAGNOSIS — Z6841 Body Mass Index (BMI) 40.0 and over, adult: Secondary | ICD-10-CM

## 2018-10-30 DIAGNOSIS — E8881 Metabolic syndrome: Secondary | ICD-10-CM

## 2018-10-30 MED ORDER — TROKENDI XR 50 MG PO CP24: 50.0000 mg | ORAL_CAPSULE | ORAL | 0 refills | Status: DC

## 2018-10-30 MED ORDER — METFORMIN HCL 500 MG PO TABS: 500.0000 mg | ORAL_TABLET | Freq: Every day | ORAL | 0 refills | Status: DC

## 2018-11-04 ENCOUNTER — Encounter (INDEPENDENT_AMBULATORY_CARE_PROVIDER_SITE_OTHER): Payer: Self-pay | Admitting: Bariatrics

## 2018-11-04 NOTE — Progress Notes (Signed)
Office: [phone]  /  Fax: [phone]   HPI:   Chief Complaint: OBESITY Savannah [name] is here to discuss her progress with her obesity treatment plan. She is on the Category 3 plan and is following her eating plan approximately 70% of the time. She states she is pedaling 10 minutes 5 times per week. Savannah [name]'s weight has remained the same. She is doing better with her water and protein intake.  Her weight is 252 lb (114.3 kg) today and has not lost weight since her last visit. She has lost 21 lbs since starting treatment with [place].  Depression with emotional eating behaviors Savannah [name] is struggling with emotional eating and using food for comfort to the extent that it is negatively impacting her health. She often snacks when she is not hungry. Savannah [name] sometimes feels she is out of control and then feels guilty that she made poor food choices. She has been working on behavior modification techniques to help reduce her emotional eating and has been somewhat successful. She shows no sign of suicidal or homicidal ideations.  Depression screen PHQ 2/9 02/24/2018  Decreased Interest 1  Down, Depressed, Hopeless 0  PHQ - 2 Score 1  Altered sleeping 2  Tired, decreased energy 3  Change in appetite 1  Feeling bad or failure about yourself  0  Trouble concentrating 0  Moving slowly or fidgety/restless 0  Suicidal thoughts 0  PHQ-9 Score 7  Difficult doing work/chores Not difficult at all   Insulin Resistance Savannah [name] has a diagnosis of insulin resistance based on her elevated fasting insulin level >5. Last A1c 5.6 on 07/21/2018 with an insulin of 19.3. Although Savannah [name]'s blood glucose readings are still under good control, insulin resistance puts her at greater risk of metabolic syndrome and diabetes. She is taking metformin currently and continues to work on diet and exercise to decrease risk of diabetes.  At risk for diabetes Savannah [name] is at higher than average risk for developing diabetes due to  her obesity. She currently denies polyuria or polydipsia.  ASSESSMENT AND PLAN:  Insulin resistance - Plan: metFORMIN (GLUCOPHAGE) 500 MG tablet  Other depression - with emotional eating - Plan: Topiramate ER (TROKENDI XR) 50 MG CP24  At risk for diabetes mellitus  Class 3 severe obesity with serious comorbidity and body mass index (BMI) of 40.0 to 44.9 in adult, unspecified obesity type (Savannah [name])  PLAN:  Depression with Emotional Eating Behaviors We discussed behavior modification techniques today to help Savannah [name] deal with her emotional eating and depression. Savannah [name] was given a prescription for Trokendi XR 50 mg 1 in the AM #30 with 0 refills. She agrees to follow-up with our clinic in 2 weeks.  Insulin Resistance Savannah [name] will continue to work on weight loss, exercise, and decreasing simple carbohydrates in her diet to help decrease the risk of diabetes. We dicussed metformin including benefits and risks. She was informed that eating too many simple carbohydrates or too many calories at one sitting increases the likelihood of GI side effects. Savannah [name] was given a prescription for metformin 500 mg 1 at noon #30 with 0 refills. She agrees to follow-up with our clinic in 2 weeks.  Diabetes risk counseling Savannah [name] was given extended (15 minutes) diabetes prevention counseling today. She is 57 y.o. female and has risk factors for diabetes including obesity. We discussed intensive lifestyle modifications today with an emphasis on weight loss as well as increasing exercise and decreasing simple carbohydrates in her diet.  Obesity Savannah [name] is currently in the action stage  of change. As such, her goal is to continue with weight loss efforts. She has agreed to follow the Category 3 plan. Savannah [name] will work on meal planning, intentional eating, and will increase her fiber in the p.m. Savannah [name] has been instructed to work up to a goal of 150 minutes of combined cardio and strengthening exercise per week for  weight loss and overall health benefits. We discussed the following Behavioral Modification Strategies today: increasing lean protein intake, decreasing simple carbohydrates, increasing vegetables, increase H20 intake, decrease eating out, no skipping meals, work on meal planning and easy cooking plans, keeping healthy foods in the home, and planning for success.  Savannah [name] has agreed to follow-up with our clinic in 2 weeks. She was informed of the importance of frequent follow-up visits to maximize her success with intensive lifestyle modifications for her multiple health conditions.  ALLERGIES: Allergies  Allergen Reactions  . Dust Mite Extract Other (See Comments)    Environmental allergies    MEDICATIONS: Current Outpatient Medications on File Prior to Visit  Medication Sig Dispense Refill  . calcium carbonate (OSCAL) 1500 (600 Ca) MG TABS tablet Take by mouth 2 (two) times daily with a meal.    . carvedilol (COREG) 3.125 MG tablet TAKE 1 TABLET TWICE A DAY 180 tablet 3  . clobetasol cream (TEMOVATE) 0.05 % APPLY A PEA SIZED AMOUNT   TOPICALLY TWO TIMES A DAY  FOR 1-2 WEEKS -NOT FOR    DAILY LONG TERM USE 60 g 0  . cyclobenzaprine (FLEXERIL) 10 MG tablet Take 1 tablet (10 mg total) by mouth 3 (three) times daily as needed for muscle spasms. 30 tablet 1  . diclofenac sodium (VOLTAREN) 1 % GEL Apply topically 4 (four) times daily.    [name] eletriptan (RELPAX) 40 MG tablet Take 1 tablet (40 mg total) by mouth 2 (two) times daily as needed for migraine or headache. 10 tablet 5  . glucosamine-chondroitin 500-400 MG tablet Take 1 tablet by mouth 3 (three) times daily.    [name] HYDROcodone-acetaminophen (NORCO) 5-325 MG tablet Take 1-2 tablets by mouth every 6 (six) hours as needed for moderate pain. 30 tablet 0  . hyoscyamine (LEVSIN, ANASPAZ) 0.125 MG tablet Take 1 tablet (0.125 mg total) by mouth every 4 (four) hours as needed. 30 tablet 1  . lisinopril (ZESTRIL) 10 MG tablet TAKE 1 TABLET DAILY 90  tablet 3  . multivitamin-lutein (OCUVITE-LUTEIN) CAPS capsule Take 1 capsule by mouth daily.    . naproxen (NAPROSYN) 500 MG tablet Take 1 tablet (500 mg total) by mouth 2 (two) times daily with a meal. 30 tablet 3  . Omega-3 Fatty Acids (FISH OIL PO) Take 1 tablet by mouth daily.     [name] PARoxetine (PAXIL) 10 MG tablet TAKE 1 TABLET EVERY        MORNING. 90 tablet 1  . venlafaxine XR (EFFEXOR-XR) 75 MG 24 hr capsule TAKE 1 CAPSULE DAILY WITH  BREAKFAST 90 capsule 3   No current facility-administered medications on file prior to visit.     PAST MEDICAL HISTORY: Past Medical History:  Diagnosis Date  . Arthritis    bilateral knees, recent LCL sprain-on left knee  . CHF (congestive heart failure) ([place])   . Environmental allergies   . Fatty liver   . Headache(784.0)    migraines-on meds for control, relpax, ketoprophen, vicoden, muscle relaxer  . Hearing loss 2016   High frequency hearing - Bilateral   . Hot flashes   . Joint pain   .  Lichen sclerosus   . Lipoma    Right Knee  . Menorrhagia   . Migraines   . Necrobiosis lipoidica   . Osteoarthritis   . Overweight   . Pelvic pain in female   . PONV (postoperative nausea and vomiting)    ponv, slow to awake  . Seasonal allergies   . Sinus problem   . Sprain 10/25/2010   left knee  . Trigeminal pulse   . Trigger finger, right    Middle finger and right wrist   . Vitamin D deficiency     PAST SURGICAL HISTORY: Past Surgical History:  Procedure Laterality Date  . APPENDECTOMY    . DIAGNOSTIC LAPAROSCOPY  08/2006  . DILATION AND CURETTAGE OF UTERUS  2011   attempted ablation  . FUNCTIONAL ENDOSCOPIC SINUS SURGERY    . ganglion cyst removal    . HYSTEROSCOPY     failed  . LASIK    . LIPOMA EXCISION Right 07/22/2014   Procedure: EXCISION RIGHT THIGH LIPOMA;  Surgeon: Erroll Luna, MD;  Location: [facility];  Service: General;  Laterality: Right;  . ROBOTIC ASSISTED TOTAL HYSTERECTOMY  11/06/10   TLH/RSO  .  SALPINGOOPHORECTOMY  11/06/2010   Procedure: SALPINGO OOPHERECTOMY;  Surgeon: Felipa Emory;  Location: [facility];  Service: Gynecology;  Laterality: Right;    SOCIAL HISTORY: Social History   Tobacco Use  . Smoking status: Never Smoker  . Smokeless tobacco: Never Used  Substance Use Topics  . Alcohol use: No    Alcohol/week: 0.0 standard drinks  . Drug use: No    FAMILY HISTORY: Family History  Problem Relation Age of Onset  . Skin cancer Mother   . Migraines Mother   . High blood pressure Mother   . Heart disease Father   . High blood pressure Father   . High Cholesterol Father   . Breast cancer Maternal Aunt 70  . Breast cancer Paternal Aunt 7  . Migraines Brother    ROS: Review of Systems  Psychiatric/Behavioral: Positive for depression. Negative for suicidal ideas.       Negative for homicidal ideas.   PHYSICAL EXAM: Blood pressure 116/77, pulse 82, temperature 98.8 F (37.1 C), temperature source Oral, height 5\' 5"  (1.651 m), weight 252 lb (114.3 kg), last menstrual period 10/14/2010, SpO2 97 %. Body mass index is 41.93 kg/m. Physical Exam Vitals signs reviewed.  Constitutional:      Appearance: Normal appearance. She is obese.  Cardiovascular:     Rate and Rhythm: Normal rate.     Pulses: Normal pulses.  Pulmonary:     Effort: Pulmonary effort is normal.     Breath sounds: Normal breath sounds.  Musculoskeletal: Normal range of motion.  Skin:    General: Skin is warm and dry.  Neurological:     Mental Status: She is alert and oriented to person, place, and time.  Psychiatric:        Behavior: Behavior normal.   RECENT LABS AND TESTS: BMET    Component Value Date/Time   NA 139 07/21/2018 1035   K 4.8 07/21/2018 1035   CL 106 07/21/2018 1035   CO2 20 07/21/2018 1035   GLUCOSE 95 07/21/2018 1035   GLUCOSE 89 04/15/2015 1512   BUN 14 07/21/2018 1035   CREATININE 0.85 07/21/2018 1035   CREATININE 0.85 04/15/2015 1512   CALCIUM 9.3 07/21/2018  1035   GFRNONAA 75 07/21/2018 1035   GFRAA 87 07/21/2018 1035   Lab Results  Component Value Date   HGBA1C 5.6 07/21/2018   HGBA1C 5.8 (H) 02/24/2018   Lab Results  Component Value Date   INSULIN 19.3 07/21/2018   INSULIN 14.1 02/24/2018   CBC    Component Value Date/Time   WBC CANCELED 10/18/2017 1449   WBC 9.6 04/15/2015 1512   RBC 4.66 04/15/2015 1512   HGB 13.6 04/15/2015 1512   HGB 14.1 12/22/2013 1605   HCT 41.8 04/15/2015 1512   PLT 319 04/15/2015 1512   MCV 89.7 04/15/2015 1512   MCH 29.2 04/15/2015 1512   MCHC 32.5 04/15/2015 1512   RDW 14.0 04/15/2015 1512   LYMPHSABS 2.3 08/02/2009 1442   MONOABS 0.6 08/02/2009 1442   EOSABS 0.2 08/02/2009 1442   BASOSABS 0.0 08/02/2009 1442   Iron/TIBC/Ferritin/ %Sat No results found for: IRON, TIBC, FERRITIN, IRONPCTSAT Lipid Panel     Component Value Date/Time   CHOL 166 07/21/2018 1035   TRIG 133 07/21/2018 1035   HDL 44 07/21/2018 1035   CHOLHDL 4.2 10/18/2017 1449   CHOLHDL 4.1 04/15/2015 1512   VLDL 36 (H) 04/15/2015 1512   LDLCALC 95 07/21/2018 1035   Hepatic Function Panel     Component Value Date/Time   PROT 6.6 07/21/2018 1035   ALBUMIN 4.1 07/21/2018 1035   AST 25 07/21/2018 1035   ALT 24 07/21/2018 1035   ALKPHOS 126 (H) 07/21/2018 1035   BILITOT 0.3 07/21/2018 1035      Component Value Date/Time   TSH 3.670 10/18/2017 1449   TSH 3.41 04/15/2015 1512   TSH 3.064 11/20/2012 1544   Results for Savannah [name], Savannah L "[name]" (MRN [id number]) as of 11/04/2018 09:42  Ref. Range 07/21/2018 10:35  Vitamin D, 25-Hydroxy Latest Ref Range: 30.0 - 100.0 ng/mL 58.1   OBESITY BEHAVIORAL INTERVENTION VISIT  Today's visit was #16   Starting weight: 273 lbs Starting date: 02/24/2018 Today's weight: 252 lbs Today's date: 10/30/2018 Total lbs lost to date: 21    10/30/2018  Height 5\' 5"  (1.651 m)  Weight 252 lb (114.3 kg)  BMI (Calculated) 41.93  BLOOD PRESSURE - SYSTOLIC 99991111  BLOOD PRESSURE - DIASTOLIC 77    Body Fat % 52.2 %  Total Body Water (lbs) 92.2 lbs   ASK: We discussed the diagnosis of obesity with Savannah [name] today and Savannah [name] agreed to give [place] permission to discuss obesity behavioral modification therapy today.  ASSESS: Savannah [name] has the diagnosis of obesity and her BMI today is 42.1. Savannah [name] is in the action stage of change.   ADVISE: Savannah [name] was educated on the multiple health risks of obesity as well as the benefit of weight loss to improve her health. She was advised of the need for long term treatment and the importance of lifestyle modifications to improve her current health and to decrease her risk of future health problems.  AGREE: Multiple dietary modification options and treatment options were discussed and  Savannah [name] agreed to follow the recommendations documented in the above note.  ARRANGE: Savannah [name] was educated on the importance of frequent visits to treat obesity as outlined per CMS and USPSTF guidelines and agreed to schedule her next follow up appointment today.  Savannah [name], am acting as Location manager for [employer], DO  I have reviewed the above documentation for accuracy and completeness, and I agree with the above. -Jearld Lesch, DO

## 2018-11-10 ENCOUNTER — Other Ambulatory Visit: Payer: Self-pay

## 2018-11-10 MED ORDER — ELETRIPTAN HYDROBROMIDE 40 MG PO TABS: 40.0000 mg | ORAL_TABLET | Freq: Two times a day (BID) | ORAL | 5 refills | Status: DC | PRN

## 2018-11-13 ENCOUNTER — Ambulatory Visit (INDEPENDENT_AMBULATORY_CARE_PROVIDER_SITE_OTHER): Payer: BC Managed Care – PPO | Admitting: Bariatrics

## 2018-11-28 ENCOUNTER — Other Ambulatory Visit (INDEPENDENT_AMBULATORY_CARE_PROVIDER_SITE_OTHER): Payer: Self-pay | Admitting: Bariatrics

## 2018-11-28 DIAGNOSIS — F3289 Other specified depressive episodes: Secondary | ICD-10-CM

## 2018-11-28 DIAGNOSIS — E8881 Metabolic syndrome: Secondary | ICD-10-CM

## 2018-12-04 ENCOUNTER — Other Ambulatory Visit: Payer: Self-pay | Admitting: Obstetrics & Gynecology

## 2018-12-04 NOTE — Telephone Encounter (Signed)
Medication refill request: Paroxetine Last AEX:  10/18/17 SM Next AEX: 02/17/19 Last MMG (if hormonal medication request): 07/16/18 BIRADS 1 negative/density  b Refill authorized: Please advise on refill; Order pended #90 w/1 refill if authorized

## 2019-02-10 DIAGNOSIS — H814 Vertigo of central origin: Secondary | ICD-10-CM | POA: Insufficient documentation

## 2019-02-11 ENCOUNTER — Other Ambulatory Visit: Payer: Self-pay | Admitting: Otolaryngology

## 2019-02-11 DIAGNOSIS — H814 Vertigo of central origin: Secondary | ICD-10-CM

## 2019-02-12 ENCOUNTER — Other Ambulatory Visit: Payer: Self-pay

## 2019-02-17 ENCOUNTER — Other Ambulatory Visit: Payer: Self-pay

## 2019-02-17 ENCOUNTER — Encounter: Payer: Self-pay | Admitting: Obstetrics & Gynecology

## 2019-02-17 ENCOUNTER — Ambulatory Visit (INDEPENDENT_AMBULATORY_CARE_PROVIDER_SITE_OTHER): Payer: BC Managed Care – PPO | Admitting: Obstetrics & Gynecology

## 2019-02-17 VITALS — BP 118/80 | HR 88 | Temp 94.2°F | Ht 64.0 in | Wt 265.0 lb

## 2019-02-17 DIAGNOSIS — Z01419 Encounter for gynecological examination (general) (routine) without abnormal findings: Secondary | ICD-10-CM | POA: Diagnosis not present

## 2019-02-17 DIAGNOSIS — N951 Menopausal and female climacteric states: Secondary | ICD-10-CM

## 2019-02-17 MED ORDER — HYOSCYAMINE SULFATE 0.125 MG PO TABS: 0.1250 mg | ORAL_TABLET | ORAL | 1 refills | Status: DC | PRN

## 2019-02-17 MED ORDER — PAROXETINE HCL 10 MG PO TABS: 10.0000 mg | ORAL_TABLET | Freq: Every morning | ORAL | 4 refills | Status: DC

## 2019-02-17 NOTE — Progress Notes (Signed)
59 y.o. G0P0000 Married White or Caucasian female here for annual exam.  Doing well except for vertigo issues.  Has been seen by Dr. Erik Obey.  Hearing testing has been done.  Has brain MRI scheduled for Sunday.  Is being seen at [facility].  Would like hormonal testing for menopause.    Patient's last menstrual period was 10/14/2010.          Sexually active: Yes.    The current method of family planning is status post hysterectomy.    Exercising: Yes.    pedaling and workout Smoker:  no  Health Maintenance: Pap:  09/01/10 Neg History of abnormal Pap:  no MMG:  07/16/18 BIRADS 1 negative/density b Colonoscopy:  2011 normal. F/u 10 years.  Pt aware can do Cologuard this year if desires.  She will call if needs order for Cologuard.   BMD:   never TDaP:  08/03/16 Pneumonia vaccine(s):  n/a Shingrix:   Discussed today Hep C testing: 04/15/15 Screening Labs: done 07/2018   reports that she has never smoked. She has never used smokeless tobacco. She reports that she does not drink alcohol or use drugs.  Past Medical History:  Diagnosis Date  . Arthritis    bilateral knees, recent LCL sprain-on left knee  . CHF (congestive heart failure) ([place])   . Environmental allergies   . Fatty liver   . Headache(784.0)    migraines-on meds for control, relpax, ketoprophen, vicoden, muscle relaxer  . Hearing loss 2016   High frequency hearing - Bilateral   . Hot flashes   . Joint pain   . Lichen sclerosus   . Lipoma    Right Knee  . Menorrhagia   . Migraines   . Necrobiosis lipoidica   . Osteoarthritis   . Overweight   . Pelvic pain in female   . PONV (postoperative nausea and vomiting)    ponv, slow to awake  . Seasonal allergies   . Sinus problem   . Sprain 10/25/2010   left knee  . Trigeminal pulse   . Trigger finger, right    Middle finger and right wrist   . Vitamin D deficiency     Past Surgical History:  Procedure Laterality Date  . APPENDECTOMY    . DIAGNOSTIC  LAPAROSCOPY  08/2006  . DILATION AND CURETTAGE OF UTERUS  2011   attempted ablation  . FUNCTIONAL ENDOSCOPIC SINUS SURGERY    . ganglion cyst removal    . HYSTEROSCOPY     failed  . LASIK    . LIPOMA EXCISION Right 07/22/2014   Procedure: EXCISION RIGHT THIGH LIPOMA;  Surgeon: Erroll Luna, MD;  Location: [facility];  Service: General;  Laterality: Right;  . ROBOTIC ASSISTED TOTAL HYSTERECTOMY  11/06/10   TLH/RSO  . SALPINGOOPHORECTOMY  11/06/2010   Procedure: SALPINGO OOPHERECTOMY;  Surgeon: Felipa Emory;  Location: [facility];  Service: Gynecology;  Laterality: Right;  . TRIGGER FINGER RELEASE      Current Outpatient Medications  Medication Sig Dispense Refill  . calcium carbonate (OSCAL) 1500 (600 Ca) MG TABS tablet Take by mouth 2 (two) times daily with a meal.    . carvedilol (COREG) 3.125 MG tablet TAKE 1 TABLET TWICE A DAY 180 tablet 3  . clobetasol cream (TEMOVATE) 0.05 % APPLY A PEA SIZED AMOUNT   TOPICALLY TWO TIMES A DAY  FOR 1-2 WEEKS -NOT FOR    DAILY LONG TERM USE 60 g 0  . cyclobenzaprine (FLEXERIL) 10  MG tablet Take 1 tablet (10 mg total) by mouth 3 (three) times daily as needed for muscle spasms. 30 tablet 1  . diclofenac sodium (VOLTAREN) 1 % GEL Apply topically 4 (four) times daily.    [name] eletriptan (RELPAX) 40 MG tablet Take 1 tablet (40 mg total) by mouth 2 (two) times daily as needed for migraine or headache. 10 tablet 5  . glucosamine-chondroitin 500-400 MG tablet Take 1 tablet by mouth 3 (three) times daily.    [name] HYDROcodone-acetaminophen (NORCO) 5-325 MG tablet Take 1-2 tablets by mouth every 6 (six) hours as needed for moderate pain. 30 tablet 0  . hyoscyamine (LEVSIN, ANASPAZ) 0.125 MG tablet Take 1 tablet (0.125 mg total) by mouth every 4 (four) hours as needed. 30 tablet 1  . lisinopril (ZESTRIL) 10 MG tablet TAKE 1 TABLET DAILY 90 tablet 3  . meclizine (ANTIVERT) 25 MG tablet SMARTSIG:1 Tablet(s) By Mouth 1 to 4 Times Daily PRN    . Multiple  Vitamins-Minerals (VITAMIN D3 COMPLETE PO) Take by mouth.    . multivitamin-lutein (OCUVITE-LUTEIN) CAPS capsule Take 1 capsule by mouth daily.    . naproxen (NAPROSYN) 500 MG tablet Take 1 tablet (500 mg total) by mouth 2 (two) times daily with a meal. 30 tablet 3  . PARoxetine (PAXIL) 10 MG tablet TAKE 1 TABLET EVERY MORNING 90 tablet 1  . venlafaxine XR (EFFEXOR-XR) 75 MG 24 hr capsule TAKE 1 CAPSULE DAILY WITH  BREAKFAST 90 capsule 3  . ondansetron (ZOFRAN) 4 MG tablet Take 4 mg by mouth daily.     No current facility-administered medications for this visit.    Family History  Problem Relation Age of Onset  . Skin cancer Mother   . Migraines Mother   . High blood pressure Mother   . Heart disease Father   . High blood pressure Father   . High Cholesterol Father   . Breast cancer Maternal Aunt 70  . Breast cancer Paternal Aunt 29  . Migraines Brother     Review of Systems  All other systems reviewed and are negative.   Exam:   BP 118/80 (BP Location: Right Arm, Patient Position: Sitting, Cuff Size: Large)   Pulse 88   Temp (!) 94.2 F (34.6 C) (Temporal)   Ht 5\' 4"  (1.626 m)   Wt 265 lb (120.2 kg)   LMP 10/14/2010   BMI 45.49 kg/m   Height: 5\' 4"  (162.6 cm)  Ht Readings from Last 3 Encounters:  02/17/19 5\' 4"  (1.626 m)  10/30/18 5\' 5"  (1.651 m)  10/09/18 5\' 5"  (1.651 m)    General appearance: alert, cooperative and appears stated age Head: Normocephalic, without obvious abnormality, atraumatic Neck: no adenopathy, supple, symmetrical, trachea midline and thyroid normal to inspection and palpation Lungs: clear to auscultation bilaterally Breasts: normal appearance, no masses or tenderness Heart: regular rate and rhythm Abdomen: soft, non-tender; bowel sounds normal; no masses,  no organomegaly Extremities: extremities normal, atraumatic, no cyanosis or edema Skin: Skin color, texture, turgor normal. No rashes or lesions Lymph nodes: Cervical, supraclavicular, and  axillary nodes normal. No abnormal inguinal nodes palpated Neurologic: Grossly normal   Pelvic: External genitalia:  no lesions              Urethra:  normal appearing urethra with no masses, tenderness or lesions              Bartholins and Skenes: normal  Vagina: normal appearing vagina with normal color and discharge, no lesions              Cervix: absent              Pap taken: No. Bimanual Exam:  Uterus:  uterus absent              Adnexa: normal adnexa               Rectovaginal: Confirms               Anus:  normal sphincter tone, no lesions  Chaperone, Terence Lux, CMA, was present for exam.  A:  Well Woman with normal exam H/o robotic TLH/RSO H/o chronic systolic congestive heart failure--followed by Dr. Johnsie Cancel H/o LS&A H/o trigeminy noted in PACU after lipoma removal 6/16 [place] H/o shingles 2017.  Vaccination discussed today.  P:   Mammogram guidelines reviewed.  Doing 3D. pap smear not indicated Rx for Paxil 10mg  daily.  #90/4RF [place] obtained today Aware colonoscopy due this year.  Cologuard discussed with pt today.  She will let me know if she paperwork done for cologuard Plan BMD after next visit Return annually or prn

## 2019-02-18 ENCOUNTER — Encounter: Payer: Self-pay | Admitting: Obstetrics & Gynecology

## 2019-02-18 LAB — FOLLICLE STIMULATING HORMONE: FSH: 70.9 m[IU]/mL

## 2019-02-22 ENCOUNTER — Other Ambulatory Visit: Payer: BC Managed Care – PPO

## 2019-03-01 ENCOUNTER — Other Ambulatory Visit: Payer: Self-pay

## 2019-03-01 ENCOUNTER — Ambulatory Visit
Admission: RE | Admit: 2019-03-01 | Discharge: 2019-03-01 | Disposition: A | Discharge status: Home / Self Care | Payer: BC Managed Care – PPO | Source: Ambulatory Visit | Attending: Otolaryngology | Admitting: Otolaryngology

## 2019-03-01 DIAGNOSIS — H814 Vertigo of central origin: Secondary | ICD-10-CM

## 2019-03-01 MED ORDER — GADOBENATE DIMEGLUMINE 529 MG/ML IV SOLN
20.0000 mL | Freq: Once | INTRAVENOUS | Status: AC | PRN
  Administered 2019-03-01: 20 mL via INTRAVENOUS

## 2019-03-13 ENCOUNTER — Other Ambulatory Visit: Payer: Self-pay

## 2019-03-17 ENCOUNTER — Ambulatory Visit: Payer: BC Managed Care – PPO | Admitting: Obstetrics & Gynecology

## 2019-03-27 ENCOUNTER — Other Ambulatory Visit: Payer: Self-pay

## 2019-03-31 ENCOUNTER — Encounter: Payer: Self-pay | Admitting: Obstetrics & Gynecology

## 2019-03-31 ENCOUNTER — Ambulatory Visit (INDEPENDENT_AMBULATORY_CARE_PROVIDER_SITE_OTHER): Payer: BC Managed Care – PPO | Admitting: Obstetrics & Gynecology

## 2019-03-31 ENCOUNTER — Other Ambulatory Visit: Payer: Self-pay

## 2019-03-31 VITALS — BP 136/80 | HR 68 | Temp 97.1°F | Resp 12 | Ht 64.0 in | Wt 270.0 lb

## 2019-03-31 DIAGNOSIS — L900 Lichen sclerosus et atrophicus: Secondary | ICD-10-CM

## 2019-03-31 DIAGNOSIS — R232 Flushing: Secondary | ICD-10-CM

## 2019-03-31 DIAGNOSIS — N951 Menopausal and female climacteric states: Secondary | ICD-10-CM | POA: Diagnosis not present

## 2019-03-31 MED ORDER — CLOBETASOL PROPIONATE 0.05 % EX CREA: TOPICAL_CREAM | Freq: Two times a day (BID) | CUTANEOUS | 1 refills | Status: DC

## 2019-03-31 NOTE — Progress Notes (Signed)
GYNECOLOGY  VISIT  HPI: 59 y.o. G0P0000 Married White or Caucasian female here for 4 week recheck for vulvar lichen sclerosus.  She has been using topical steroids daily or twice daily when she can remember.  Itching is better.  Skin feels better.  Denies vaginal or vulvar bleeding.  Pt and I reviewed [place] obtained at her [facility].  [place] on 02/17/2019 was 70.9.  Pt has been on Paxil for hot flashes and has experienced good control with the Paxil.  She wants to know if she can stop the Paxil.  Advised I do not know if she will have hot flashes or not when stops, so should consider a slow taper.  Will decrease to 1/2 tab daily for 4-6 weeks.  If she does not have new or significant hot flashes, then she can take 5mg  (1/2 tab) every other day for another month.  Again, if does not have significant symptoms, she will then stop.  Questions answered.  GYNECOLOGIC HISTORY: Patient's last menstrual period was 10/14/2010. Contraception: Postmenopausal Menopausal hormone therapy: none  Patient Active Problem List   Diagnosis Date Noted  . Trigger finger, right middle finger 10/10/2018  . Other insomnia 06/23/2018  . Vitamin D deficiency 03/26/2018  . Prediabetes 03/11/2018  . Class 3 severe obesity with serious comorbidity and body mass index (BMI) of 40.0 to 44.9 in adult ([place]) 02/26/2018  . Migraines 10/01/2016  . Decreased cardiac ejection fraction 10/01/2016  . BMI 45.0-49.9, adult ([place]) 10/01/2016  . Shingles 08/03/2016  . Chronic pain of right knee 05/24/2016  . Unilateral primary osteoarthritis, right knee 05/24/2016  . Posterior tibial tendinitis, right leg 01/30/2016  . Lichen sclerosus [id number]    Past Medical History:  Diagnosis Date  . Arthritis    bilateral knees, recent LCL sprain-on left knee  . CHF (congestive heart failure) ([place])   . Environmental allergies   . Fatty liver   . Headache(784.0)    migraines-on meds for control, relpax, ketoprophen, vicoden, muscle relaxer  .  Hearing loss 2016   High frequency hearing - Bilateral   . Hot flashes   . Joint pain   . Lichen sclerosus   . Lipoma    Right Knee  . Menorrhagia   . Migraines   . Necrobiosis lipoidica   . Osteoarthritis   . Overweight   . Pelvic pain in female   . PONV (postoperative nausea and vomiting)    ponv, slow to awake  . Seasonal allergies   . Sinus problem   . Sprain 10/25/2010   left knee  . Trigeminal pulse   . Trigger finger, right    Middle finger and right wrist   . Vitamin D deficiency     Past Surgical History:  Procedure Laterality Date  . APPENDECTOMY    . DIAGNOSTIC LAPAROSCOPY  08/2006  . DILATION AND CURETTAGE OF UTERUS  2011   attempted ablation  . FUNCTIONAL ENDOSCOPIC SINUS SURGERY    . ganglion cyst removal    . HYSTEROSCOPY     failed  . LASIK    . LIPOMA EXCISION Right 07/22/2014   Procedure: EXCISION RIGHT THIGH LIPOMA;  Surgeon: Erroll Luna, MD;  Location: [facility];  Service: General;  Laterality: Right;  . ROBOTIC ASSISTED TOTAL HYSTERECTOMY  11/06/10   TL[facility]/RSO  . SALPINGOOPHORECTOMY  11/06/2010   Procedure: SALPINGO OOPHERECTOMY;  Surgeon: Felipa Emory;  Location: [facility];  Service: Gynecology;  Laterality: Right;  . TRIGGER FINGER RELEASE  MEDS:   Current Outpatient Medications on File Prior to Visit  Medication Sig Dispense Refill  . calcium carbonate (OSCAL) 1500 (600 Ca) MG TABS tablet Take by mouth 2 (two) times daily with a meal.    . carvedilol (COREG) 3.125 MG tablet TAKE 1 TABLET TWICE A DAY 180 tablet 3  . clobetasol cream (TEMOVATE) 0.05 % APPLY A PEA SIZED AMOUNT   TOPICALLY TWO TIMES A DAY  FOR 1-2 WEEKS -NOT FOR    DAILY LONG TERM USE 60 g 0  . cyclobenzaprine (FLEXERIL) 10 MG tablet Take 1 tablet (10 mg total) by mouth 3 (three) times daily as needed for muscle spasms. 30 tablet 1  . diclofenac sodium (VOLTAREN) 1 % GEL Apply topically 4 (four) times daily.    [name] eletriptan (RELPAX) 40 MG tablet Take 1 tablet  (40 mg total) by mouth 2 (two) times daily as needed for migraine or headache. 10 tablet 5  . glucosamine-chondroitin 500-400 MG tablet Take 1 tablet by mouth 3 (three) times daily.    [name] HYDROcodone-acetaminophen (NORCO) 5-325 MG tablet Take 1-2 tablets by mouth every 6 (six) hours as needed for moderate pain. 30 tablet 0  . hyoscyamine (LEVSIN) 0.125 MG tablet Take 1 tablet (0.125 mg total) by mouth every 4 (four) hours as needed. 30 tablet 1  . lisinopril (ZESTRIL) 10 MG tablet TAKE 1 TABLET DAILY 90 tablet 3  . meclizine (ANTIVERT) 25 MG tablet SMARTSIG:1 Tablet(s) By Mouth 1 to 4 Times Daily PRN    . Multiple Vitamins-Minerals (VITAMIN D3 COMPLETE PO) Take by mouth.    . multivitamin-lutein (OCUVITE-LUTEIN) CAPS capsule Take 1 capsule by mouth daily.    . naproxen (NAPROSYN) 500 MG tablet Take 1 tablet (500 mg total) by mouth 2 (two) times daily with a meal. 30 tablet 3  . ondansetron (ZOFRAN) 4 MG tablet Take 4 mg by mouth daily.    [name] PARoxetine (PAXIL) 10 MG tablet Take 1 tablet (10 mg total) by mouth every morning. 90 tablet 4  . venlafaxine XR (EFFEXOR-XR) 75 MG 24 hr capsule TAKE 1 CAPSULE DAILY WITH  BREAKFAST 90 capsule 3   No current facility-administered medications on file prior to visit.    ALLERGIES: Dust mite extract  Family History  Problem Relation Age of Onset  . Skin cancer Mother   . Migraines Mother   . High blood pressure Mother   . Heart disease Father   . High blood pressure Father   . High Cholesterol Father   . Breast cancer Maternal Aunt 70  . Breast cancer Paternal Aunt 21  . Migraines Brother     SH:  Married, non smoker  Review of Systems  All other systems reviewed and are negative.   PHYSICAL EXAMINATION:    BP 136/80 (BP Location: Right Arm, Patient Position: Sitting, Cuff Size: Large)   Pulse 68   Temp (!) 97.1 F (36.2 C) (Temporal)   Resp 12   Ht 5\' 4"  (1.626 m)   Wt 270 lb (122.5 kg)   LMP 10/14/2010   BMI 46.35 kg/m     Physical  Exam  Constitutional: She is oriented to person, place, and time. She appears well-developed and well-nourished.  Genitourinary:     Lymphadenopathy:       Right: No inguinal adenopathy present.       Left: No inguinal adenopathy present.  Neurological: She is alert and oriented to person, place, and time.  Skin: Skin is warm and dry.  Psychiatric: She has a normal mood and affect.   Chaperone, Terence Lux, CMA, was present for exam.  Assessment: Lichen sclerosus, improved Hot flashes that have been well controlled with Paxil Recent FSH showing menopausal range  Plan: She is going to continue using the clobetasol nightly for the next 6-8 weeks and will recheck after that time. Treatment for "flares" discussed as well as reasons for needing repeat biopsy. Rx for RF done today for clobetasol 0.05% ointment Paxil taper will be done as described above in the note.  Pt's questions were answered.  20 minutes spent with pt with discussion of above and physical exam.

## 2019-03-31 NOTE — Patient Instructions (Signed)
Paxil taper.  Take 5mg  daily for about 6 weeks.  Then take 5mg  every other day for two weeks and stop.

## 2019-04-02 DIAGNOSIS — K760 Fatty (change of) liver, not elsewhere classified: Secondary | ICD-10-CM | POA: Insufficient documentation

## 2019-04-18 ENCOUNTER — Ambulatory Visit: Payer: BC Managed Care – PPO | Attending: Internal Medicine

## 2019-04-18 DIAGNOSIS — Z23 Encounter for immunization: Secondary | ICD-10-CM | POA: Insufficient documentation

## 2019-04-18 NOTE — Progress Notes (Signed)
   Covid-19 Vaccination Clinic  Name:  Savannah [name]    MRN: [id number] DOB: 30-May-1960  04/18/2019  [name] was observed post Covid-19 immunization for 15 minutes without incident. She was provided with Vaccine Information Sheet and instruction to access the V-Safe system.   [name] was instructed to call 911 with any severe reactions post vaccine: [name] Difficulty breathing  . Swelling of face and throat  . A fast heartbeat  . A bad rash all over body  . Dizziness and weakness   Immunizations Administered    Name Date Dose VIS Date Route   Pfizer COVID-19 Vaccine 04/18/2019  4:18 PM 0.3 mL 01/23/2019 Intramuscular   Manufacturer: [place]   Lot: [id number]   [place]: [id number]

## 2019-04-19 ENCOUNTER — Encounter: Payer: Self-pay | Admitting: Obstetrics & Gynecology

## 2019-04-20 ENCOUNTER — Other Ambulatory Visit: Payer: Self-pay | Admitting: Obstetrics & Gynecology

## 2019-04-20 MED ORDER — CYCLOBENZAPRINE HCL 10 MG PO TABS: 10.0000 mg | ORAL_TABLET | Freq: Three times a day (TID) | ORAL | 1 refills | Status: DC | PRN

## 2019-05-09 ENCOUNTER — Ambulatory Visit: Payer: BC Managed Care – PPO | Attending: Internal Medicine

## 2019-05-09 DIAGNOSIS — Z23 Encounter for immunization: Secondary | ICD-10-CM

## 2019-05-09 NOTE — Progress Notes (Signed)
   Covid-19 Vaccination Clinic  Name:  IZZABELLE VELTRI    MRN: [id number] DOB: May 22, 1960  05/09/2019  Ms. Marter was observed post Covid-19 immunization for 15 minutes without incident. She was provided with Vaccine Information Sheet and instruction to access the V-Safe system.   Ms. Ferrence was instructed to call 911 with any severe reactions post vaccine: [name] Difficulty breathing  . Swelling of face and throat  . A fast heartbeat  . A bad rash all over body  . Dizziness and weakness   Immunizations Administered    Name Date Dose VIS Date Route   Pfizer COVID-19 Vaccine 05/09/2019  1:19 PM 0.3 mL 01/23/2019 Intramuscular   Manufacturer: [place]   Lot: [id number]   [place]: [id number]

## 2019-05-28 ENCOUNTER — Other Ambulatory Visit: Payer: Self-pay

## 2019-05-28 NOTE — Progress Notes (Signed)
GYNECOLOGY  VISIT  CC:   Vulvar recheck  HPI: 59 y.o. G0P0000 Married White or Caucasian female here for follow up of lichen sclerosus.  Denies vulvar itching or bleeding.  Still using steroid nightly.    GYNECOLOGIC HISTORY: Patient's last menstrual period was 10/14/2010. Contraception: hysterectomy Menopausal hormone therapy: none  Patient Active Problem List   Diagnosis Date Noted  . Fatty liver disease, nonalcoholic 99991111  . 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  . Trigger finger, right middle finger 10/10/2018  . Other insomnia 06/23/2018  . Vitamin D deficiency 03/26/2018  . Prediabetes 03/11/2018  . Class 3 severe obesity with serious comorbidity and body mass index (BMI) of 40.0 to 44.9 in adult ([place]) 02/26/2018  . Migraines 10/01/2016  . Decreased cardiac ejection fraction 10/01/2016  . BMI 45.0-49.9, adult ([place]) 10/01/2016  . Shingles 08/03/2016  . Chronic pain of right knee 05/24/2016  . Unilateral primary osteoarthritis, right knee 05/24/2016  . Posterior tibial tendinitis, right leg 01/30/2016  . Lichen sclerosus [id number]    Past Medical History:  Diagnosis Date  . Arthritis    bilateral knees, recent LCL sprain-on left knee  . CHF (congestive heart failure) ([place])   . Environmental allergies   . Fatty liver   . Headache(784.0)    migraines-on meds for control, relpax, ketoprophen, vicoden, muscle relaxer  . Hearing loss 2016   High frequency hearing - Bilateral   . Hot flashes   . Joint pain   . Lichen sclerosus   . Lipoma    Right Knee  . Menorrhagia   . Migraines   . Necrobiosis lipoidica   . Osteoarthritis   . Overweight   . Pelvic pain in female   . PONV (postoperative nausea and vomiting)    ponv, slow to awake  . Seasonal allergies   . Sinus problem   . Sprain 10/25/2010   left knee  . Trigeminal pulse   . Trigger finger, right    Middle finger and right wrist   . Vitamin D deficiency     Past Surgical History:   Procedure Laterality Date  . APPENDECTOMY    . DIAGNOSTIC LAPAROSCOPY  08/2006  . DILATION AND CURETTAGE OF UTERUS  2011   attempted ablation  . FUNCTIONAL ENDOSCOPIC SINUS SURGERY    . ganglion cyst removal    . HYSTEROSCOPY     failed  . LASIK    . LIPOMA EXCISION Right 07/22/2014   Procedure: EXCISION RIGHT THIGH LIPOMA;  Surgeon: Erroll Luna, MD;  Location: [facility];  Service: General;  Laterality: Right;  . ROBOTIC ASSISTED TOTAL HYSTERECTOMY  11/06/10   TLH/RSO  . SALPINGOOPHORECTOMY  11/06/2010   Procedure: SALPINGO OOPHERECTOMY;  Surgeon: Felipa Emory;  Location: [facility];  Service: Gynecology;  Laterality: Right;  . TRIGGER FINGER RELEASE      MEDS:   Current Outpatient Medications on File Prior to Visit  Medication Sig Dispense Refill  . calcium carbonate (OSCAL) 1500 (600 Ca) MG TABS tablet Take by mouth 2 (two) times daily with a meal.    . carvedilol (COREG) 3.125 MG tablet TAKE 1 TABLET TWICE A DAY 180 tablet 3  . clobetasol cream (TEMOVATE) 0.05 % Apply topically 2 (two) times daily. 60 g 1  . cyclobenzaprine (FLEXERIL) 10 MG tablet Take 1 tablet (10 mg total) by mouth 3 (three) times daily as needed for muscle spasms. 30 tablet 1  . diclofenac sodium (VOLTAREN) 1 % GEL Apply topically 4 (four)  times daily.    [name] eletriptan (RELPAX) 40 MG tablet Take 1 tablet (40 mg total) by mouth 2 (two) times daily as needed for migraine or headache. 10 tablet 5  . glucosamine-chondroitin 500-400 MG tablet Take 1 tablet by mouth 3 (three) times daily.    [name] HYDROcodone-acetaminophen (NORCO) 5-325 MG tablet Take 1-2 tablets by mouth every 6 (six) hours as needed for moderate pain. 30 tablet 0  . hyoscyamine (LEVSIN) 0.125 MG tablet Take 1 tablet (0.125 mg total) by mouth every 4 (four) hours as needed. 30 tablet 1  . lisinopril (ZESTRIL) 10 MG tablet TAKE 1 TABLET DAILY 90 tablet 3  . meclizine (ANTIVERT) 25 MG tablet SMARTSIG:1 Tablet(s) By Mouth 1 to 4 Times Daily  PRN    . Multiple Vitamins-Minerals (VITAMIN D3 COMPLETE PO) Take by mouth.    . multivitamin-lutein (OCUVITE-LUTEIN) CAPS capsule Take 1 capsule by mouth daily.    . naproxen (NAPROSYN) 500 MG tablet Take 1 tablet (500 mg total) by mouth 2 (two) times daily with a meal. 30 tablet 3  . ondansetron (ZOFRAN) 4 MG tablet Take 4 mg by mouth as needed.     . venlafaxine XR (EFFEXOR-XR) 75 MG 24 hr capsule TAKE 1 CAPSULE DAILY WITH  BREAKFAST 90 capsule 3   No current facility-administered medications on file prior to visit.    ALLERGIES: Dust mite extract  Family History  Problem Relation Age of Onset  . Skin cancer Mother   . Migraines Mother   . High blood pressure Mother   . Heart disease Father   . High blood pressure Father   . High Cholesterol Father   . Breast cancer Maternal Aunt 70  . Breast cancer Paternal Aunt 25  . Migraines Brother     SH:  Married, non smoker  Review of Systems  Constitutional: Negative.   HENT: Negative.   Eyes: Negative.   Respiratory: Negative.   Cardiovascular: Negative.   Gastrointestinal: Negative.   Endocrine: Negative.   Genitourinary: Negative.   Musculoskeletal: Negative.   Skin: Negative.   Allergic/Immunologic: Negative.   Neurological: Negative.   Psychiatric/Behavioral: Negative.     PHYSICAL EXAMINATION:    BP 118/80   Pulse 80   Temp 97.8 F (36.6 C) (Skin)   Resp 16   Wt 276 lb (125.2 kg)   LMP 10/14/2010   BMI 47.38 kg/m     General appearance: alert, cooperative and appears stated age Lymph:  no inguinal LAD noted  Pelvic: External genitalia:  Symmetric area of hypopigmentation (that is much improved) from inner labia majora superiorly all the way down to the rectum still present, no skin excoriations or ulcerations              Urethra:  normal appearing urethra with no masses, tenderness or lesions              Bartholins and Skenes: normal     Chaperone, Joy Johson, CMA, was present for  exam.  Assessment: Biopsy proven lichen sclerosus, much improved  Plan: Pt will taper off clobetasol and then transition to mometasone 0.1% ointment only twice weekly.  She knows to call with any new symptoms/concerns.

## 2019-05-29 ENCOUNTER — Ambulatory Visit: Payer: BC Managed Care – PPO | Admitting: Obstetrics & Gynecology

## 2019-05-29 ENCOUNTER — Encounter: Payer: Self-pay | Admitting: Obstetrics & Gynecology

## 2019-05-29 ENCOUNTER — Other Ambulatory Visit: Payer: Self-pay

## 2019-05-29 VITALS — BP 118/80 | HR 80 | Temp 97.8°F | Resp 16 | Wt 276.0 lb

## 2019-05-29 DIAGNOSIS — L900 Lichen sclerosus et atrophicus: Secondary | ICD-10-CM

## 2019-05-29 MED ORDER — MOMETASONE FUROATE 0.1 % EX OINT: TOPICAL_OINTMENT | Freq: Every day | CUTANEOUS | 1 refills | Status: DC

## 2019-05-29 NOTE — Patient Instructions (Signed)
Taper down topical clobetasol to two or three times weekly. Do this for another month.  If you don't have any new itching, will transition to the mometasone 0.1% ointment twice weekly.

## 2019-07-02 ENCOUNTER — Encounter (HOSPITAL_COMMUNITY): Payer: Self-pay | Admitting: *Deleted

## 2019-07-02 ENCOUNTER — Other Ambulatory Visit: Payer: Self-pay

## 2019-07-02 ENCOUNTER — Emergency Department (HOSPITAL_COMMUNITY)
Admission: EM | Admit: 2019-07-02 | Discharge: 2019-07-02 | Disposition: A | Discharge status: Home / Self Care | Payer: BC Managed Care – PPO | Attending: Emergency Medicine | Admitting: Emergency Medicine

## 2019-07-02 DIAGNOSIS — Z79899 Other long term (current) drug therapy: Secondary | ICD-10-CM | POA: Diagnosis not present

## 2019-07-02 DIAGNOSIS — R21 Rash and other nonspecific skin eruption: Secondary | ICD-10-CM | POA: Insufficient documentation

## 2019-07-02 DIAGNOSIS — I509 Heart failure, unspecified: Secondary | ICD-10-CM | POA: Insufficient documentation

## 2019-07-02 DIAGNOSIS — R519 Headache, unspecified: Secondary | ICD-10-CM | POA: Insufficient documentation

## 2019-07-02 DIAGNOSIS — R112 Nausea with vomiting, unspecified: Secondary | ICD-10-CM

## 2019-07-02 LAB — CBC
HCT: 44.5 % (ref 36.0–46.0)
Hemoglobin: 14.4 g/dL (ref 12.0–15.0)
MCH: 30.2 pg (ref 26.0–34.0)
MCHC: 32.4 g/dL (ref 30.0–36.0)
MCV: 93.3 fL (ref 80.0–100.0)
Platelets: 312 10*3/uL (ref 150–400)
RBC: 4.77 MIL/uL (ref 3.87–5.11)
RDW: 13.6 % (ref 11.5–15.5)
WBC: 9 10*3/uL (ref 4.0–10.5)
nRBC: 0 % (ref 0.0–0.2)

## 2019-07-02 LAB — COMPREHENSIVE METABOLIC PANEL
ALT: 35 U/L (ref 0–44)
AST: 35 U/L (ref 15–41)
Albumin: 3.8 g/dL (ref 3.5–5.0)
Alkaline Phosphatase: 109 U/L (ref 38–126)
Anion gap: 10 (ref 5–15)
BUN: 13 mg/dL (ref 6–20)
CO2: 25 mmol/L (ref 22–32)
Calcium: 9.7 mg/dL (ref 8.9–10.3)
Chloride: 106 mmol/L (ref 98–111)
Creatinine, Ser: 0.93 mg/dL (ref 0.44–1.00)
GFR calc Af Amer: >60 mL/min (ref >60)
GFR calc non Af Amer: >60 mL/min (ref >60)
Glucose, Bld: 107 mg/dL — ABNORMAL HIGH (ref 70–99)
Potassium: 4.6 mmol/L (ref 3.5–5.1)
Sodium: 141 mmol/L (ref 135–145)
Total Bilirubin: 0.6 mg/dL (ref 0.3–1.2)
Total Protein: 7.1 g/dL (ref 6.5–8.1)

## 2019-07-02 LAB — LIPASE, BLOOD: Lipase: 32 U/L (ref 11–51)

## 2019-07-02 LAB — URINALYSIS, ROUTINE W REFLEX MICROSCOPIC
Bilirubin Urine: NEGATIVE
Glucose, UA: NEGATIVE mg/dL
Hgb urine dipstick: NEGATIVE
Ketones, ur: NEGATIVE mg/dL
Leukocytes,Ua: NEGATIVE
Nitrite: NEGATIVE
Protein, ur: NEGATIVE mg/dL
Specific Gravity, Urine: 1.012 (ref 1.005–1.030)
pH: 7 (ref 5.0–8.0)

## 2019-07-02 LAB — I-STAT BETA HCG BLOOD, ED (MC, WL, AP ONLY): I-stat hCG, quantitative: 7.7 m[IU]/mL — ABNORMAL HIGH (ref <5)

## 2019-07-02 MED ORDER — DIPHENHYDRAMINE HCL 50 MG/ML IJ SOLN
12.5000 mg | Freq: Once | INTRAMUSCULAR | Status: AC
  Administered 2019-07-02: 12.5 mg via INTRAVENOUS
  Filled 2019-07-02: qty 1

## 2019-07-02 MED ORDER — KETOROLAC TROMETHAMINE 15 MG/ML IJ SOLN: 15.0000 mg | Freq: Once | INTRAMUSCULAR | Status: DC

## 2019-07-02 MED ORDER — SODIUM CHLORIDE 0.9 % IV BOLUS
500.0000 mL | Freq: Once | INTRAVENOUS | Status: AC
  Administered 2019-07-02: 500 mL via INTRAVENOUS

## 2019-07-02 MED ORDER — SODIUM CHLORIDE 0.9% FLUSH
3.0000 mL | Freq: Once | INTRAVENOUS | Status: AC
  Administered 2019-07-02: 3 mL via INTRAVENOUS

## 2019-07-02 MED ORDER — PROCHLORPERAZINE EDISYLATE 10 MG/2ML IJ SOLN
10.0000 mg | Freq: Once | INTRAMUSCULAR | Status: AC
  Administered 2019-07-02: 10 mg via INTRAVENOUS
  Filled 2019-07-02: qty 2

## 2019-07-02 MED ORDER — PROMETHAZINE HCL 25 MG PO TABS: 25.0000 mg | ORAL_TABLET | Freq: Four times a day (QID) | ORAL | 0 refills | Status: DC | PRN

## 2019-07-02 MED ORDER — KETOROLAC TROMETHAMINE 15 MG/ML IJ SOLN
15.0000 mg | Freq: Once | INTRAMUSCULAR | Status: AC
  Administered 2019-07-02: 15 mg via INTRAVENOUS
  Filled 2019-07-02: qty 1

## 2019-07-02 NOTE — Discharge Instructions (Signed)
As discussed, all of your labs were reassuring today.  I suspect your nausea and vomiting is related to a viral infection.  I am sending you home with a different nausea medication.  If you become nauseous again start with Zofran and wait 30 minutes.  If your symptoms do not improve after Zofran you may take the medication that I prescribed you.  Please follow-up with PCP if symptoms do not improve within the next week.  Return to the ER for new or worsening symptoms.

## 2019-07-02 NOTE — ED Provider Notes (Signed)
[facility] EMERGENCY DEPARTMENT Provider Note   CSN: [id number] Arrival date & time: 07/02/19  1024     History Chief Complaint  Patient presents with  . Emesis  . Headache    Savannah [name] is a 59 y.o. female with a past medical history significant for congestive heart failure, fatty liver, history of migraines, and vitamin D deficiency who presents to the ED due to numerous episodes of nonbloody, nonbilious emesis x4 days.  Patient states she had a televisit yesterday and was prescribed Zofran which she has been using as prescribed with moderate relief.  Patient states her nausea and vomiting has improved slightly. She notes her coworker she saw last week is experiencing similar symptoms. Denies associated abdominal pain and diarrhea. Patient also admits to having a bilateral headache for the past 1.5 days which she attributes to dehydration. Denies visual changes.  Patient has a history of migraines, but notes this feels slightly different than her normal migraine.  She took her migraine medication earlier today with mild improvement in symptoms.  Admits to photophobia.  Denies fever, but admits to chills. Denies sudden onset and worst intensity at onset. No aggravating or alleviating factors.  Denies speech difficulties, unilateral weakness, facial droop, and numbness/tingling.  History obtained from patient and past medical records. No interpreter used during encounter.      Past Medical History:  Diagnosis Date  . Arthritis    bilateral knees, recent LCL sprain-on left knee  . CHF (congestive heart failure) ([facility])   . Environmental allergies   . Fatty liver   . Headache(784.0)    migraines-on meds for control, relpax, ketoprophen, vicoden, muscle relaxer  . Hearing loss 2016   High frequency hearing - Bilateral   . Hot flashes   . Joint pain   . Lichen sclerosus   . Lipoma    Right Knee  . Menorrhagia   . Migraines   . Necrobiosis lipoidica   .  Osteoarthritis   . Overweight   . Pelvic pain in female   . PONV (postoperative nausea and vomiting)    ponv, slow to awake  . Seasonal allergies   . Sinus problem   . Sprain 10/25/2010   left knee  . Trigeminal pulse   . Trigger finger, right    Middle finger and right wrist   . Vitamin D deficiency     Patient Active Problem List   Diagnosis Date Noted  . Fatty liver disease, nonalcoholic 99991111  . 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  . Trigger finger, right middle finger 10/10/2018  . Other insomnia 06/23/2018  . Vitamin D deficiency 03/26/2018  . Prediabetes 03/11/2018  . Class 3 severe obesity with serious comorbidity and body mass index (BMI) of 40.0 to 44.9 in adult ([facility]) 02/26/2018  . Migraines 10/01/2016  . Decreased cardiac ejection fraction 10/01/2016  . BMI 45.0-49.9, adult ([facility]) 10/01/2016  . Shingles 08/03/2016  . Chronic pain of right knee 05/24/2016  . Unilateral primary osteoarthritis, right knee 05/24/2016  . Posterior tibial tendinitis, right leg 01/30/2016  . Lichen sclerosus [id number]    Past Surgical History:  Procedure Laterality Date  . APPENDECTOMY    . DIAGNOSTIC LAPAROSCOPY  08/2006  . DILATION AND CURETTAGE OF UTERUS  2011   attempted ablation  . FUNCTIONAL ENDOSCOPIC SINUS SURGERY    . ganglion cyst removal    . HYSTEROSCOPY     failed  . LASIK    . LIPOMA EXCISION Right 07/22/2014  Procedure: EXCISION RIGHT THIGH LIPOMA;  Surgeon: Erroll Luna, MD;  Location: [facility];  Service: General;  Laterality: Right;  . ROBOTIC ASSISTED TOTAL HYSTERECTOMY  11/06/10   TLH/RSO  . SALPINGOOPHORECTOMY  11/06/2010   Procedure: SALPINGO OOPHERECTOMY;  Surgeon: Felipa Emory;  Location: [facility];  Service: Gynecology;  Laterality: Right;  . TRIGGER FINGER RELEASE       OB History    Gravida  0   Para  0   Term  0   Preterm  0   AB  0   Living  0     SAB  0   TAB  0   Ectopic  0   Multiple  0    Live Births              Family History  Problem Relation Age of Onset  . Skin cancer Mother   . Migraines Mother   . High blood pressure Mother   . Heart disease Father   . High blood pressure Father   . High Cholesterol Father   . Breast cancer Maternal Aunt 70  . Breast cancer Paternal Aunt 98  . Migraines Brother     Social History   Tobacco Use  . Smoking status: Never Smoker  . Smokeless tobacco: Never Used  Substance Use Topics  . Alcohol use: No    Alcohol/week: 0.0 standard drinks  . Drug use: No    Home Medications Prior to Admission medications   Medication Sig Start Date End Date Taking? Authorizing Provider  calcium carbonate (OSCAL) 1500 (600 Ca) MG TABS tablet Take by mouth 2 (two) times daily with a meal.    [provider]  carvedilol (COREG) 3.125 MG tablet TAKE 1 TABLET TWICE A DAY 08/26/18   Josue Hector, MD  clobetasol cream (TEMOVATE) 0.05 % Apply topically 2 (two) times daily. 03/31/19   Megan Salon, MD  cyclobenzaprine (FLEXERIL) 10 MG tablet Take 1 tablet (10 mg total) by mouth 3 (three) times daily as needed for muscle spasms. 04/20/19   Megan Salon, MD  diclofenac sodium (VOLTAREN) 1 % GEL Apply topically 4 (four) times daily.    [provider]  eletriptan (RELPAX) 40 MG tablet Take 1 tablet (40 mg total) by mouth 2 (two) times daily as needed for migraine or headache. 11/10/18   Kathrynn Ducking, MD  glucosamine-chondroitin 500-400 MG tablet Take 1 tablet by mouth 3 (three) times daily.    [provider]  HYDROcodone-acetaminophen (NORCO) 5-325 MG tablet Take 1-2 tablets by mouth every 6 (six) hours as needed for moderate pain. 08/28/18   Aundra Dubin, PA-C  hyoscyamine (LEVSIN) 0.125 MG tablet Take 1 tablet (0.125 mg total) by mouth every 4 (four) hours as needed. 02/17/19   Megan Salon, MD  lisinopril (ZESTRIL) 10 MG tablet TAKE 1 TABLET DAILY 08/26/18   Josue Hector, MD  meclizine (ANTIVERT) 25 MG  tablet SMARTSIG:1 Tablet(s) By Mouth 1 to 4 Times Daily PRN 01/29/19   [provider]  mometasone (ELOCON) 0.1 % ointment Apply topically daily. 05/29/19   Megan Salon, MD  Multiple Vitamins-Minerals (VITAMIN D3 COMPLETE PO) Take by mouth.    [provider]  multivitamin-lutein (OCUVITE-LUTEIN) CAPS capsule Take 1 capsule by mouth daily.    [provider]  naproxen (NAPROSYN) 500 MG tablet Take 1 tablet (500 mg total) by mouth 2 (two) times daily with a meal. 10/10/18   Erlinda Hong,  Marylynn Pearson, MD  ondansetron (ZOFRAN) 4 MG tablet Take 4 mg by mouth as needed.  01/29/19   [provider]  promethazine (PHENERGAN) 25 MG tablet Take 1 tablet (25 mg total) by mouth every 6 (six) hours as needed for nausea or vomiting. 07/02/19   Suzy Bouchard, PA-C  venlafaxine XR (EFFEXOR-XR) 75 MG 24 hr capsule TAKE 1 CAPSULE DAILY WITH  BREAKFAST 08/26/18   Suzzanne Cloud, NP    Allergies    Dust mite extract  Review of Systems   Review of Systems  Constitutional: Positive for chills. Negative for fever.  Respiratory: Negative for shortness of breath.   Cardiovascular: Negative for chest pain.  Gastrointestinal: Positive for nausea and vomiting. Negative for abdominal pain and diarrhea.  Genitourinary: Negative for dysuria and vaginal discharge.  Neurological: Positive for headaches. Negative for facial asymmetry and speech difficulty.  All other systems reviewed and are negative.   Physical Exam Updated Vital Signs BP (!) 128/92 (BP Location: Right Arm)   Pulse 93   Temp 98.4 F (36.9 C) (Oral)   Resp 16   Ht 5\' 5"  (1.651 m)   Wt 122.5 kg   LMP 10/14/2010   SpO2 94%   BMI 44.93 kg/m   Physical Exam Vitals and nursing note reviewed.  Constitutional:      General: She is not in acute distress.    Appearance: She is not ill-appearing.  HENT:     Head: Normocephalic.  Eyes:     Extraocular Movements: Extraocular movements intact.     Pupils: Pupils are  equal, round, and reactive to light.  Neck:     Comments: No meningismus. Cardiovascular:     Rate and Rhythm: Normal rate and regular rhythm.     Pulses: Normal pulses.     Heart sounds: Normal heart sounds. No murmur. No friction rub. No gallop.   Pulmonary:     Effort: Pulmonary effort is normal.     Breath sounds: Normal breath sounds.  Abdominal:     General: Abdomen is flat. Bowel sounds are normal. There is no distension.     Palpations: Abdomen is soft.     Tenderness: There is no abdominal tenderness. There is no guarding or rebound.     Comments: Abdomen soft, nondistended, nontender to palpation in all quadrants without guarding or peritoneal signs. No rebound.   Musculoskeletal:     Cervical back: Neck supple.     Comments: Able to move all 4 extremities without difficulty.   Skin:    General: Skin is warm.     Findings: Rash present.     Comments: Papular rash over extremities  Neurological:     General: No focal deficit present.     Mental Status: She is alert.     Comments: Speech is clear, able to follow commands CN III-XII intact Normal strength in upper and lower extremities bilaterally including dorsiflexion and plantar flexion, strong and equal grip strength Sensation grossly intact throughout Moves extremities without ataxia, coordination intact No pronator drift Ambulates without difficulty  Psychiatric:        Mood and Affect: Mood normal.        Behavior: Behavior normal.     ED Results / Procedures / Treatments   Labs (all labs ordered are listed, but only abnormal results are displayed) Labs Reviewed  COMPREHENSIVE METABOLIC PANEL - Abnormal; Notable for the following components:      Result Value   Glucose, Bld 107 (*)  All other components within normal limits  I-STAT BETA HCG BLOOD, ED (MC, WL, AP ONLY) - Abnormal; Notable for the following components:   I-stat hCG, quantitative 7.7 (*)    All other components within normal limits    LIPASE, BLOOD  URINALYSIS, ROUTINE W REFLEX MICROSCOPIC  CBC    EKG None  Radiology No results found.  Procedures Procedures (including critical care time)  Medications Ordered in ED Medications  sodium chloride flush (NS) 0.9 % injection 3 mL (3 mLs Intravenous Given 07/02/19 1244)  sodium chloride 0.9 % bolus 500 mL (0 mLs Intravenous Stopped 07/02/19 1426)  diphenhydrAMINE (BENADRYL) injection 12.5 mg (12.5 mg Intravenous Given 07/02/19 1241)  prochlorperazine (COMPAZINE) injection 10 mg (10 mg Intravenous Given 07/02/19 1241)  ketorolac (TORADOL) 15 MG/ML injection 15 mg (15 mg Intravenous Given 07/02/19 1252)    ED Course  I have reviewed the triage vital signs and the nursing notes.  Pertinent labs & imaging results that were available during my care of the patient were reviewed by me and considered in my medical decision making (see chart for details).  Clinical Course as of Jul 01 1498  Thu Jul 02, 2019  1216 WBC: 9.0 [CA]  1334 Creatinine: 0.93 [CA]  1334 BUN: 13 [CA]    Clinical Course User Index [CA] Suzy Bouchard, PA-C   MDM Rules/Calculators/A&P                     59 year old female presents the ED due to nausea and vomiting x4 days associated with a headache for the past 1.5 days.  Denies fever.  Denies abdominal pain and diarrhea.  Upon arrival, patient is afebrile, not tachycardic or hypoxic.  Patient in no acute distress and nontoxic-appearing.  Physical exam reassuring.  Normal neurological exam.  No meningismus to suggest meningitis.  Abdomen soft, nondistended, nontender.  No concern for acute abdomen. Will obtain routine labs to check for signs of infection and electrolyte abnormalities.  Migraine cocktail for symptomatic relief and reassess patient. No concern for [facility] or other emergent causes of HA. Suspect HA related to dehydration from numerous episodes of emesis.  CBC unremarkable no leukocytosis.  CMP reassuring with normal renal function and no  major electrolyte abnormalities.  Lipase normal at 32.  Doubt pancreatitis.  2:07 PM reassessed patient at bedside who notes improvement in her headache and nausea.  Patient has had no episodes of emesis while here in the ED.  Discussed results with patient.  Patient drinking p.o. water at bedside and will leave urine when able to.  2:58 PM reassessed patient at bedside who notes improvement in symptoms.  Patient notes she is ready to go home.  UA negative for signs of infection and hematuria.  Will discharge patient with Phenergan.  Advised patient to take Zofran and Phenergan as needed for nausea.  Oral hydration discussed with patient.  Advised patient to follow-up with PCP if symptoms do not improve within the next week. Strict ED precautions discussed with patient. Patient states understanding and agrees to plan. Patient discharged home in no acute distress and stable vitals.  Discussed case with Dr. Ralene Bathe who evaluated patient at bedside and agrees with assessment and plan.  Final Clinical Impression(s) / ED Diagnoses Final diagnoses:  Nausea and vomiting, intractability of vomiting not specified, unspecified vomiting type  Acute nonintractable headache, unspecified headache type    Rx / DC Orders ED Discharge Orders         Ordered  promethazine (PHENERGAN) 25 MG tablet  Every 6 hours PRN     07/02/19 1437           Karie Kirks 07/02/19 1504    Quintella Reichert, MD 07/03/19 1215

## 2019-07-02 NOTE — ED Notes (Signed)
Patient verbalizes understanding of discharge instructions. Opportunity for questioning and answers were provided. Armband removed by staff, pt discharged from ED.  

## 2019-07-02 NOTE — ED Triage Notes (Signed)
Pt reports having severe headache and n/v for over 24 hrs, no relief with zofran at home. Denies diarrhea.

## 2019-07-29 ENCOUNTER — Other Ambulatory Visit: Payer: Self-pay

## 2019-07-29 MED ORDER — CARVEDILOL 3.125 MG PO TABS: 3.1250 mg | ORAL_TABLET | Freq: Two times a day (BID) | ORAL | 0 refills | Status: DC

## 2019-07-29 MED ORDER — LISINOPRIL 10 MG PO TABS: 10.0000 mg | ORAL_TABLET | Freq: Every day | ORAL | 0 refills | Status: DC

## 2019-08-08 ENCOUNTER — Encounter: Payer: Self-pay | Admitting: Obstetrics & Gynecology

## 2019-08-20 ENCOUNTER — Other Ambulatory Visit: Payer: Self-pay | Admitting: Obstetrics & Gynecology

## 2019-08-20 DIAGNOSIS — Z1231 Encounter for screening mammogram for malignant neoplasm of breast: Secondary | ICD-10-CM

## 2019-08-27 ENCOUNTER — Telehealth: Payer: Self-pay | Admitting: *Deleted

## 2019-08-27 NOTE — Telephone Encounter (Signed)
° °  [facility] Pre-operative Risk Assessment    HEARTCARE STAFF: - Please ensure there is not already an duplicate clearance open for this procedure. - Under Visit Info/Reason for Call, type in Other and utilize the format Clearance MM/DD/YY or Clearance TBD. Do not use dashes or single digits. - If request is for dental extraction, please clarify the # of teeth to be extracted.  Request for surgical clearance:  1. What type of surgery is being performed?  COLONOSCOPY   2. When is this surgery scheduled?  10/14/19   3. What type of clearance is required (medical clearance vs. Pharmacy clearance to hold med vs. Both)?  MEDICAL  4. Are there any medications that need to be held prior to surgery and how long? N/A   5. Practice name and name of physician performing surgery?  [facility] /    6. What is the office phone number?  [phone]   7.   What is the office fax number?  [phone]  8.   Anesthesia type (None, local, MAC, general) ?  PROPOFOL   Jeanann Lewandowsky 08/27/2019, 3:59 PM  _________________________________________________________________   (provider comments below)

## 2019-08-27 NOTE — Telephone Encounter (Signed)
   Primary Cardiologist:Peter Johnsie Cancel, MD  Chart reviewed as part of pre-operative protocol coverage. Because of Savannah [name]'s past medical history and time since last visit, they will require a follow-up visit in order to better assess preoperative cardiovascular risk. Patient is scheduled to see Dr. Johnsie Cancel 09/10/19.  Pre-op covering staff: - Please add "pre-op clearance" to the appointment notes for upcoming visit with Dr. Johnsie Cancel so provider is aware. - Please contact requesting surgeon's office via preferred method (i.e, phone, fax) to inform them of need for appointment prior to surgery.   Abigail Butts, PA-C  08/27/2019, 4:50 PM

## 2019-08-27 NOTE — Telephone Encounter (Signed)
Pt has an appointment already scheduled with Dr Johnsie Cancel 09-10-2019, will add on to this appt. Pt notified this will be added on. She is grateful for the call.

## 2019-08-28 ENCOUNTER — Other Ambulatory Visit: Payer: Self-pay

## 2019-08-28 ENCOUNTER — Other Ambulatory Visit: Payer: Self-pay | Admitting: Gastroenterology

## 2019-08-28 ENCOUNTER — Other Ambulatory Visit (HOSPITAL_COMMUNITY): Payer: Self-pay | Admitting: Gastroenterology

## 2019-08-28 ENCOUNTER — Ambulatory Visit
Admission: RE | Admit: 2019-08-28 | Discharge: 2019-08-28 | Disposition: A | Discharge status: Home / Self Care | Payer: BC Managed Care – PPO | Source: Ambulatory Visit | Attending: Obstetrics & Gynecology | Admitting: Obstetrics & Gynecology

## 2019-08-28 DIAGNOSIS — Z1231 Encounter for screening mammogram for malignant neoplasm of breast: Secondary | ICD-10-CM

## 2019-08-28 DIAGNOSIS — R110 Nausea: Secondary | ICD-10-CM

## 2019-08-28 NOTE — Progress Notes (Signed)
Cardiology Office Note   Date:  09/10/2019   ID:  Savannah [name], DOB 1960-10-29, MRN [id number]  PCP:  Savannah Cruel, MD  Cardiologist:  Dr. Johnsie [name]     No chief complaint on file.     History of Present Illness: Savannah [name] is a 59 y.o. female who presents for f/u NIDCM/PVC;s   She a history of PVC's. Asymptomatic. No history of CAD.  Uses Relpax for migraines. Had arthroscopuc knee surgery in 1287 with no complications Also had shingles along her ribs.    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    Now going to wt loss through [facility] she has lost 25 lbs.  Doing well. She is planning on Lt knee surgery. And needs a colonoscopy in September  Seen in ER 07/02/19 with emesis and headache   Had COVID vaccine in March   Has also had 2 episodes of prolonged nausea with emesis Seeing Dr Savannah [name] for HIDA/US Some exertional dyspnea persists as well as some edema   Past Medical History:  Diagnosis Date   Arthritis    bilateral knees, recent LCL sprain-on left knee   CHF (congestive heart failure) ([facility])    Environmental allergies    Fatty liver    Headache(784.0)    migraines-on meds for control, relpax, ketoprophen, vicoden, muscle relaxer   Hearing loss 2016   High frequency hearing - Bilateral    Hot flashes    Joint pain    Lichen sclerosus    Lipoma    Right Knee   Menorrhagia    Migraines    Necrobiosis lipoidica    Osteoarthritis    Overweight    Pelvic pain in female    PONV (postoperative nausea and vomiting)    ponv, slow to awake   Seasonal allergies    Sinus  problem    Sprain 10/25/2010   left knee   Trigeminal pulse    Trigger finger, right    Middle finger and right wrist    Vitamin D deficiency     Past Surgical History:  Procedure Laterality Date   APPENDECTOMY     DIAGNOSTIC LAPAROSCOPY  08/2006   DILATION AND CURETTAGE OF UTERUS  2011   attempted ablation   FUNCTIONAL ENDOSCOPIC SINUS SURGERY     ganglion cyst removal     HYSTEROSCOPY     failed   LASIK     LIPOMA EXCISION Right 07/22/2014   Procedure: EXCISION RIGHT THIGH LIPOMA;  Surgeon: Savannah Luna, MD;  Location: [facility];  Service: General;  Laterality: Right;   ROBOTIC ASSISTED TOTAL HYSTERECTOMY  11/06/10   TLH/RSO   SALPINGOOPHORECTOMY  11/06/2010   Procedure: SALPINGO OOPHERECTOMY;  Surgeon: Savannah [name];  Location: [facility];  Service: Gynecology;  Laterality: Right;   TRIGGER FINGER RELEASE       Current Outpatient Medications  Medication Sig Dispense Refill   calcium carbonate (OSCAL) 1500 (600 Ca) MG TABS tablet Take by mouth 2 (two) times daily with a meal.  carvedilol (COREG) 3.125 MG tablet Take 1 tablet (3.125 mg total) by mouth 2 (two) times daily. Please keep upcoming appt in July with Dr. Johnsie [name] for future refills. Thank you 180 tablet 0   clobetasol cream (TEMOVATE) 0.05 % Apply topically 2 (two) times daily. 60 g 1   cyclobenzaprine (FLEXERIL) 10 MG tablet Take 1 tablet (10 mg total) by mouth 3 (three) times daily as needed for muscle spasms. 30 tablet 1   diclofenac sodium (VOLTAREN) 1 % GEL Apply topically 4 (four) times daily.     eletriptan (RELPAX) 40 MG tablet Take 1 tablet (40 mg total) by mouth 2 (two) times daily as needed for migraine or headache. 10 tablet 5   glucosamine-chondroitin 500-400 MG tablet Take 1 tablet by mouth 3 (three) times daily.     HYDROcodone-acetaminophen (NORCO) 5-325 MG tablet Take 1-2 tablets by mouth every 6 (six) hours as needed for moderate pain. 30 tablet 0   hyoscyamine  (LEVSIN) 0.125 MG tablet Take 1 tablet (0.125 mg total) by mouth every 4 (four) hours as needed. 30 tablet 1   lisinopril (ZESTRIL) 10 MG tablet Take 1 tablet (10 mg total) by mouth daily. Please keep upcoming appt in July with Dr. Johnsie [name] for future refills. Thank you 90 tablet 0   meclizine (ANTIVERT) 25 MG tablet SMARTSIG:1 Tablet(s) By Mouth 1 to 4 Times Daily PRN     mometasone (ELOCON) 0.1 % ointment Apply topically daily. 45 g 1   Multiple Vitamins-Minerals (VITAMIN D3 COMPLETE PO) Take by mouth.     multivitamin-lutein (OCUVITE-LUTEIN) CAPS capsule Take 1 capsule by mouth daily.     naproxen (NAPROSYN) 500 MG tablet Take 1 tablet (500 mg total) by mouth 2 (two) times daily with a meal. 30 tablet 3   ondansetron (ZOFRAN) 4 MG tablet Take 4 mg by mouth as needed.      promethazine (PHENERGAN) 25 MG tablet Take 1 tablet (25 mg total) by mouth every 6 (six) hours as needed for nausea or vomiting. 30 tablet 0   venlafaxine XR (EFFEXOR-XR) 75 MG 24 hr capsule TAKE 1 CAPSULE DAILY WITH  BREAKFAST 90 capsule 3   No current facility-administered medications for this visit.    Allergies:   Dust mite extract    Social History:  The patient  reports that she has never smoked. She has never used smokeless tobacco. She reports that she does not drink alcohol and does not use drugs.   Family History:  The patient's family history includes Breast cancer (age of onset: 66) in her maternal aunt and paternal aunt; Heart disease in her father; High Cholesterol in her father; High blood pressure in her father and mother; Migraines in her brother and mother; Skin cancer in her mother.    ROS:  General:no colds or fevers, no weight changes + wt loss  Skin:no rashes or ulcers HEENT:no blurred vision, no congestion CV:see HPI PUL:see HPI GI:no diarrhea constipation or melena, no indigestion GU:no hematuria, no dysuria MS:no joint pain, no claudication Neuro:no syncope, no  lightheadedness Endo:no diabetes, no thyroid disease  Wt Readings from Last 3 Encounters:  09/10/19 (!) 277 lb (125.6 kg)  09/01/19 279 lb 6.4 oz (126.7 kg)  07/02/19 270 lb (122.5 kg)     PHYSICAL EXAM: VS:  BP 106/82    Pulse (!) 115    Ht 5\' 5"  (1.651 m)    Wt (!) 277 lb (125.6 kg)    LMP 10/14/2010    SpO2 96%  BMI 46.10 kg/m  , BMI Body mass index is 46.1 kg/m.  Affect appropriate Healthy:  appears stated age 47: normal Neck supple with no adenopathy JVP normal no bruits no thyromegaly Lungs clear with no wheezing and good diaphragmatic motion Heart:  S1/S2 no murmur, no rub, gallop or click PMI normal Abdomen: benighn, BS positve, no tenderness, no AAA no bruit.  No HSM or HJR Distal pulses intact with no bruits No edema Neuro non-focal Skin warm and dry Left knee arthritis  Post right long trigger finger release    EKG:  09/08/18  sinus tach at 107 and no ST changes. 09/10/19 SR rate 115 limb lead voltage LVH    Recent Labs: 07/02/2019: ALT 35; BUN 13; Creatinine, Ser 0.93; Hemoglobin 14.4; Platelets 312; Potassium 4.6; Sodium 141    Lipid Panel    Component Value Date/Time   CHOL 166 07/21/2018 1035   TRIG 133 07/21/2018 1035   HDL 44 07/21/2018 1035   CHOLHDL 4.2 10/18/2017 1449   CHOLHDL 4.1 04/15/2015 1512   VLDL 36 (H) 04/15/2015 1512   [place] 95 07/21/2018 1035       Other studies Reviewed: Additional studies/ records that were reviewed today include: Improved  Echo 09/10/17 Study Conclusions  - Left ventricle: The cavity size was normal. Systolic function was   mildly reduced. The estimated ejection fraction was in the range   of 45% to 50%. Mild diffuse hypokinesis with no identifiable   regional variations. There was an increased relative contribution   of atrial contraction to ventricular filling. Doppler parameters   are consistent with abnormal left ventricular relaxation (grade 1   diastolic dysfunction). - Aortic valve:  Trileaflet; normal thickness, mildly calcified   leaflets.  Impressions:  - Midly reduced LVF with EF 45-50% with grade 1 DD, trivial MR/TR.   ASSESSMENT AND PLAN:  1.  NICM may be due to her PVCs EF 45-50% TTE 09/10/17 will update    2.  Chest pain, history of Calcium score 0 myovue normal 09/15/18   3.  Lt knee pain, may need surgery soon.   4. GI:  Colonoscopy September with propofol clear to have this done   5. Edema:  Dependant PRN lasix 10 mg called in low sodium diet see above regarding echo  6. GI; clear to have bariatric surgery and colonoscopy ? Biliary cholic f/u Dr Savannah [name] after HIDA/US    Current medicines are reviewed with the patient today.  The patient Has no concerns regarding medicines.  The following changes have been made:  See above Labs/ tests ordered today include:see above  Disposition:   FU:  6 months   Signed, Jenkins Rouge, MD  09/10/2019 9:11 AM    [facility] [address], [address], [place], [place] Phone: [phone]; Fax: [phone]

## 2019-09-01 ENCOUNTER — Other Ambulatory Visit: Payer: Self-pay

## 2019-09-01 ENCOUNTER — Ambulatory Visit: Payer: BC Managed Care – PPO | Admitting: Neurology

## 2019-09-01 ENCOUNTER — Encounter: Payer: Self-pay | Admitting: Neurology

## 2019-09-01 VITALS — BP 124/72 | HR 73 | Ht 65.0 in | Wt 279.4 lb

## 2019-09-01 DIAGNOSIS — G43909 Migraine, unspecified, not intractable, without status migrainosus: Secondary | ICD-10-CM | POA: Diagnosis not present

## 2019-09-01 MED ORDER — ELETRIPTAN HYDROBROMIDE 40 MG PO TABS: 40.0000 mg | ORAL_TABLET | Freq: Two times a day (BID) | ORAL | 5 refills | Status: DC | PRN

## 2019-09-01 MED ORDER — VENLAFAXINE HCL ER 75 MG PO CP24: ORAL_CAPSULE | ORAL | 3 refills | Status: DC

## 2019-09-01 NOTE — Progress Notes (Signed)
PATIENT: Savannah [name] DOB: 1961/02/04  REASON FOR VISIT: follow up HISTORY FROM: patient  HISTORY OF PRESENT ILLNESS: Today 09/01/19  Savannah [name] is a 59 year old female history of migraine headaches.  She is on Effexor and Relpax as needed.  She has previously failed Topamax.  Over the last several weeks, her headaches have increased, likely related to work-related stress.  On average now having 2-3 migraines a week.  She takes Relpax with good benefit.  She is considering bariatric surgery.  She has chronic left knee pain, needs replacement.  Seeing chiropractor tomorrow.  Presents today for evaluation unaccompanied for  HISTORY 08/26/2018 SS: Savannah [name] is a 59 year old female with history of migraine headaches.  She has failed Topamax in the past.  She remains on Effexor 75 mg daily, Relpax as needed. She has had 2 headache in the last 6 weeks. This is a good control for her.  She will take Relpax and the headaches subside. She says triggers are stress and weather. 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 Her overall health has been good since last visit. She is in a weight reduction program. She is now down 25 lbs in the last 6 months. She is having surgery for trigger finger next week, planning left knee replacement if she loses more weight. On average she may take 2 relpax per month. She sees her women's health doctor for primary care.  She presents for follow-up unaccompanied.   REVIEW OF SYSTEMS: Out of a complete 14 system review of symptoms, the patient complains only of the following symptoms, and all other reviewed systems are negative.  Headache  ALLERGIES: Allergies  Allergen Reactions  . Dust Mite Extract Other (See Comments)    Environmental allergies    HOME MEDICATIONS: Outpatient Medications Prior to Visit  Medication Sig Dispense Refill  . calcium carbonate (OSCAL) 1500 (600 Ca) MG TABS tablet Take by mouth 2 (two) times daily with a  meal.    . carvedilol (COREG) 3.125 MG tablet Take 1 tablet (3.125 mg total) by mouth 2 (two) times daily. Please keep upcoming appt in July with Dr. Johnsie Cancel for future refills. Thank you 180 tablet 0  . clobetasol cream (TEMOVATE) 0.05 % Apply topically 2 (two) times daily. 60 g 1  . cyclobenzaprine (FLEXERIL) 10 MG tablet Take 1 tablet (10 mg total) by mouth 3 (three) times daily as needed for muscle spasms. 30 tablet 1  . diclofenac sodium (VOLTAREN) 1 % GEL Apply topically 4 (four) times daily.    [name] glucosamine-chondroitin 500-400 MG tablet Take 1 tablet by mouth 3 (three) times daily.    [name] HYDROcodone-acetaminophen (NORCO) 5-325 MG tablet Take 1-2 tablets by mouth every 6 (six) hours as needed for moderate pain. 30 tablet 0  . hyoscyamine (LEVSIN) 0.125 MG tablet Take 1 tablet (0.125 mg total) by mouth every 4 (four) hours as needed. 30 tablet 1  . lisinopril (ZESTRIL) 10 MG tablet Take 1 tablet (10 mg total) by mouth daily. Please keep upcoming appt in July with Dr. Johnsie Cancel for future refills. Thank you 90 tablet 0  . meclizine (ANTIVERT) 25 MG tablet SMARTSIG:1 Tablet(s) By Mouth 1 to 4 Times Daily PRN    . mometasone (ELOCON) 0.1 % ointment Apply topically daily. 45 g 1  . Multiple Vitamins-Minerals (VITAMIN D3 COMPLETE PO) Take by mouth.    . multivitamin-lutein (OCUVITE-LUTEIN) CAPS capsule Take 1 capsule by mouth daily.    [name]  naproxen (NAPROSYN) 500 MG tablet Take 1 tablet (500 mg total) by mouth 2 (two) times daily with a meal. 30 tablet 3  . ondansetron (ZOFRAN) 4 MG tablet Take 4 mg by mouth as needed.     . promethazine (PHENERGAN) 25 MG tablet Take 1 tablet (25 mg total) by mouth every 6 (six) hours as needed for nausea or vomiting. 30 tablet 0  . eletriptan (RELPAX) 40 MG tablet Take 1 tablet (40 mg total) by mouth 2 (two) times daily as needed for migraine or headache. 10 tablet 5  . venlafaxine XR (EFFEXOR-XR) 75 MG 24 hr capsule TAKE 1 CAPSULE DAILY WITH  BREAKFAST 90 capsule 3    No facility-administered medications prior to visit.    PAST MEDICAL HISTORY: Past Medical History:  Diagnosis Date  . Arthritis    bilateral knees, recent LCL sprain-on left knee  . CHF (congestive heart failure) ([place])   . Environmental allergies   . Fatty liver   . Headache(784.0)    migraines-on meds for control, relpax, ketoprophen, vicoden, muscle relaxer  . Hearing loss 2016   High frequency hearing - Bilateral   . Hot flashes   . Joint pain   . Lichen sclerosus   . Lipoma    Right Knee  . Menorrhagia   . Migraines   . Necrobiosis lipoidica   . Osteoarthritis   . Overweight   . Pelvic pain in female   . PONV (postoperative nausea and vomiting)    ponv, slow to awake  . Seasonal allergies   . Sinus problem   . Sprain 10/25/2010   left knee  . Trigeminal pulse   . Trigger finger, right    Middle finger and right wrist   . Vitamin D deficiency     PAST SURGICAL HISTORY: Past Surgical History:  Procedure Laterality Date  . APPENDECTOMY    . DIAGNOSTIC LAPAROSCOPY  08/2006  . DILATION AND CURETTAGE OF UTERUS  2011   attempted ablation  . FUNCTIONAL ENDOSCOPIC SINUS SURGERY    . ganglion cyst removal    . HYSTEROSCOPY     failed  . LASIK    . LIPOMA EXCISION Right 07/22/2014   Procedure: EXCISION RIGHT THIGH LIPOMA;  Surgeon: Erroll Luna, MD;  Location: [facility];  Service: General;  Laterality: Right;  . ROBOTIC ASSISTED TOTAL HYSTERECTOMY  11/06/10   [facility]/RSO  . SALPINGOOPHORECTOMY  11/06/2010   Procedure: SALPINGO OOPHERECTOMY;  Surgeon: Felipa Emory;  Location: [facility];  Service: Gynecology;  Laterality: Right;  . TRIGGER FINGER RELEASE      FAMILY HISTORY: Family History  Problem Relation Age of Onset  . Skin cancer Mother   . Migraines Mother   . High blood pressure Mother   . Heart disease Father   . High blood pressure Father   . High Cholesterol Father   . Breast cancer Maternal Aunt 70  . Breast cancer Paternal Aunt 73   . Migraines Brother     SOCIAL HISTORY: Social History   Socioeconomic History  . Marital status: Married    Spouse name: Chyann Ambrocio  . Number of children: 0  . Years of education: 84  . Highest education level: Not on file  Occupational History  . Occupation: Administrator, Civil Service  Tobacco Use  . Smoking status: Never Smoker  . Smokeless tobacco: Never Used  Vaping Use  . Vaping Use: Never used  Substance and Sexual Activity  . Alcohol use: No  Alcohol/week: 0.0 standard drinks  . Drug use: No  . Sexual activity: Yes    Birth control/protection: Surgical    Comment: TL[facility]/RSO  Other Topics Concern  . Not on file  Social History Narrative   Lives w/ husband, Eddie Dibbles   Caffeine use: Diet coke girl   Right handed    Social Determinants of Health   Financial Resource Strain:   . Difficulty of Paying Living Expenses:   Food Insecurity:   . Worried About Charity fundraiser in the Last Year:   . Arboriculturist in the Last Year:   Transportation Needs:   . Film/video editor (Medical):   [name] Lack of Transportation (Non-Medical):   Physical Activity:   . Days of Exercise per Week:   . Minutes of Exercise per Session:   Stress:   . Feeling of Stress :   Social Connections:   . Frequency of Communication with Friends and Family:   . Frequency of Social Gatherings with Friends and Family:   . Attends Religious Services:   . Active Member of Clubs or Organizations:   . Attends Archivist Meetings:   [name] Marital Status:   Intimate Partner Violence:   . Fear of Current or Ex-Partner:   . Emotionally Abused:   [name] Physically Abused:   . Sexually Abused:    PHYSICAL EXAM  Vitals:   09/01/19 1507  BP: 124/72  Pulse: 73  Weight: 279 lb 6.4 oz (126.7 kg)  Height: 5\' 5"  (1.651 m)   Body mass index is 46.49 kg/m.  Generalized: Well developed, in no acute distress   Neurological examination  Mentation: Alert oriented to time, place, history taking. Follows  all commands speech and language fluent Cranial nerve II-XII: Pupils were equal round reactive to light. Extraocular movements were full, visual field were full on confrontational test. Facial sensation and strength were normal. Head turning and shoulder shrug  were normal and symmetric. Motor: The motor testing reveals 5 over 5 strength of all 4 extremities. Good symmetric motor tone is noted throughout.  Sensory: Sensory testing is intact to soft touch on all 4 extremities. No evidence of extinction is noted.  Coordination: Cerebellar testing reveals good finger-nose-finger and heel-to-shin bilaterally.  Gait and station: Gait is normal.  Reflexes: Deep tendon reflexes are symmetric and normal bilaterally.   DIAGNOSTIC DATA (LABS, IMAGING, TESTING) - I reviewed patient records, labs, notes, testing and imaging myself where available.  Lab Results  Component Value Date   WBC 9.0 07/02/2019   HGB 14.4 07/02/2019   HCT 44.5 07/02/2019   MCV 93.3 07/02/2019   PLT 312 07/02/2019      Component Value Date/Time   NA 141 07/02/2019 1115   NA 139 07/21/2018 1035   K 4.6 07/02/2019 1115   CL 106 07/02/2019 1115   CO2 25 07/02/2019 1115   GLUCOSE 107 (H) 07/02/2019 1115   BUN 13 07/02/2019 1115   BUN 14 07/21/2018 1035   CREATININE 0.93 07/02/2019 1115   CREATININE 0.85 04/15/2015 1512   CALCIUM 9.7 07/02/2019 1115   PROT 7.1 07/02/2019 1115   PROT 6.6 07/21/2018 1035   ALBUMIN 3.8 07/02/2019 1115   ALBUMIN 4.1 07/21/2018 1035   AST 35 07/02/2019 1115   ALT 35 07/02/2019 1115   ALKPHOS 109 07/02/2019 1115   BILITOT 0.6 07/02/2019 1115   BILITOT 0.3 07/21/2018 1035   GFRNONAA >60 07/02/2019 1115   GFRAA >60 07/02/2019 1115   Lab Results  Component Value Date   CHOL 166 07/21/2018   HDL 44 07/21/2018   LDLCALC 95 07/21/2018   TRIG 133 07/21/2018   CHOLHDL 4.2 10/18/2017   Lab Results  Component Value Date   HGBA1C 5.6 07/21/2018   No results found for: VITAMINB12 Lab  Results  Component Value Date   TSH 3.670 10/18/2017    ASSESSMENT AND PLAN 59 y.o. year old female  has a past medical history of Arthritis, CHF (congestive heart failure) ([place]), Environmental allergies, Fatty liver, Headache(784.0), Hearing loss (2016), Hot flashes, Joint pain, Lichen sclerosus, Lipoma, Menorrhagia, Migraines, Necrobiosis lipoidica, Osteoarthritis, Overweight, Pelvic pain in female, PONV (postoperative nausea and vomiting), Seasonal allergies, Sinus problem, Sprain (10/25/2010), Trigeminal pulse, Trigger finger, right, and Vitamin D deficiency. here with:  1.  Chronic migraine headache  Over the last several weeks, she has had an increase in her headaches.  For now, she will remain on Effexor XR 75 mg daily for migraine prevention, Relpax as needed for acute headache.  We talked about if the headache frequency does not improve, would consider CGRP Ajovy or Emgality.  She will let me know if she wants to pursue this.  Otherwise, follow-up 1 year or sooner if needed.  I spent 20 minutes of face-to-face and non-face-to-face time with patient.  This included previsit chart review, lab review, study review, order entry, electronic health record documentation, patient education.  Butler Denmark, AGNP-C, DNP 09/01/2019, 4:19 PM [facility] [address], [place], [place] [address] [phone]

## 2019-09-01 NOTE — Progress Notes (Signed)
I have read the note, and I agree with the clinical assessment and plan.  Atsushi Yom K Alias Villagran   

## 2019-09-01 NOTE — Patient Instructions (Signed)
Continue current medications If headaches continue, consider Ajovy or Emgality injection for migraine prevention  See you back in 1 year

## 2019-09-10 ENCOUNTER — Encounter: Payer: Self-pay | Admitting: Cardiovascular Disease

## 2019-09-10 ENCOUNTER — Other Ambulatory Visit: Payer: Self-pay

## 2019-09-10 ENCOUNTER — Ambulatory Visit: Payer: BC Managed Care – PPO | Admitting: Cardiovascular Disease

## 2019-09-10 VITALS — BP 106/82 | HR 115 | Ht 65.0 in | Wt 277.0 lb

## 2019-09-10 DIAGNOSIS — R0600 Dyspnea, unspecified: Secondary | ICD-10-CM

## 2019-09-10 DIAGNOSIS — Z01810 Encounter for preprocedural cardiovascular examination: Secondary | ICD-10-CM | POA: Diagnosis not present

## 2019-09-10 DIAGNOSIS — I428 Other cardiomyopathies: Secondary | ICD-10-CM | POA: Diagnosis not present

## 2019-09-10 DIAGNOSIS — R0609 Other forms of dyspnea: Secondary | ICD-10-CM

## 2019-09-10 MED ORDER — FUROSEMIDE 20 MG PO TABS: 10.0000 mg | ORAL_TABLET | Freq: Every day | ORAL | 3 refills | Status: AC | PRN

## 2019-09-10 MED ORDER — CARVEDILOL 6.25 MG PO TABS: 6.2500 mg | ORAL_TABLET | Freq: Two times a day (BID) | ORAL | 3 refills | Status: DC

## 2019-09-10 MED ORDER — FUROSEMIDE 20 MG PO TABS: 10.0000 mg | ORAL_TABLET | Freq: Every day | ORAL | 11 refills | Status: DC | PRN

## 2019-09-10 NOTE — Patient Instructions (Addendum)
Medication Instructions:  Your physician has recommended you make the following change in your medication:  1-Increase Coreg 6.25 mg by mouth twice daily 2-Take Lasix 10 mg by mouth daily as needed.  *If you need a refill on your cardiac medications before your next appointment, please call your pharmacy*  Lab Work: If you have labs (blood work) drawn today and your tests are completely normal, you will receive your results only by: [name] MyChart Message (if you have MyChart) OR . A paper copy in the mail If you have any lab test that is abnormal or we need to change your treatment, we will call you to review the results.  Testing/Procedures: Your physician has requested that you have an echocardiogram. Echocardiography is a painless test that uses sound waves to create images of your heart. It provides your doctor with information about the size and shape of your heart and how well your heart's chambers and valves are working. This procedure takes approximately one hour. There are no restrictions for this procedure.  Follow-Up: At [facility], you and your health needs are our priority.  As part of our continuing mission to provide you with exceptional heart care, we have created designated Provider Care Teams.  These Care Teams include your primary Cardiologist (physician) and Advanced Practice Providers (APPs -  Physician Assistants and Nurse Practitioners) who all work together to provide you with the care you need, when you need it.  We recommend signing up for the patient portal called "MyChart".  Sign up information is provided on this After Visit Summary.  MyChart is used to connect with patients for Virtual Visits (Telemedicine).  Patients are able to view lab/test results, encounter notes, upcoming appointments, etc.  Non-urgent messages can be sent to your provider as well.   To learn more about what you can do with MyChart, go to [email or web address].    Your next appointment:    6 months  The format for your next appointment:   In Person  Provider:   You may see Jenkins Rouge, MD or one of the following Advanced Practice Providers on your designated Care Team:    Truitt Merle, NP  Cecilie Kicks, NP  Kathyrn Drown, NP

## 2019-09-15 ENCOUNTER — Ambulatory Visit (HOSPITAL_COMMUNITY)
Admission: RE | Admit: 2019-09-15 | Discharge: 2019-09-15 | Disposition: A | Discharge status: Home / Self Care | Payer: BC Managed Care – PPO | Source: Ambulatory Visit | Attending: Gastroenterology | Admitting: Gastroenterology

## 2019-09-15 ENCOUNTER — Encounter (HOSPITAL_COMMUNITY)
Admission: RE | Admit: 2019-09-15 | Discharge: 2019-09-15 | Disposition: A | Discharge status: Home / Self Care | Payer: BC Managed Care – PPO | Source: Ambulatory Visit | Attending: Gastroenterology | Admitting: Gastroenterology

## 2019-09-15 ENCOUNTER — Other Ambulatory Visit: Payer: Self-pay

## 2019-09-15 DIAGNOSIS — R110 Nausea: Secondary | ICD-10-CM | POA: Diagnosis not present

## 2019-09-15 MED ORDER — TECHNETIUM TC 99M MEBROFENIN IV KIT
5.0000 | PACK | Freq: Once | INTRAVENOUS | Status: AC | PRN
  Administered 2019-09-15: 5 via INTRAVENOUS

## 2019-09-25 ENCOUNTER — Encounter: Payer: Self-pay | Admitting: Obstetrics & Gynecology

## 2019-09-25 ENCOUNTER — Other Ambulatory Visit: Payer: Self-pay

## 2019-09-25 ENCOUNTER — Ambulatory Visit (HOSPITAL_COMMUNITY): Payer: BC Managed Care – PPO | Attending: Cardiology

## 2019-09-25 DIAGNOSIS — Z01810 Encounter for preprocedural cardiovascular examination: Secondary | ICD-10-CM | POA: Diagnosis not present

## 2019-09-25 DIAGNOSIS — I428 Other cardiomyopathies: Secondary | ICD-10-CM | POA: Insufficient documentation

## 2019-09-25 DIAGNOSIS — R0600 Dyspnea, unspecified: Secondary | ICD-10-CM | POA: Insufficient documentation

## 2019-09-25 DIAGNOSIS — R0609 Other forms of dyspnea: Secondary | ICD-10-CM

## 2019-09-25 LAB — ECHOCARDIOGRAM COMPLETE
Area-P 1/2: 3.72 cm2
S' Lateral: 3.4 cm

## 2019-09-28 ENCOUNTER — Telehealth: Payer: Self-pay

## 2019-09-28 NOTE — Telephone Encounter (Signed)
   [facility] Pre-operative Risk Assessment    HEARTCARE STAFF: - Please ensure there is not already an duplicate clearance open for this procedure. - Under Visit Info/Reason for Call, type in Other and utilize the format Clearance MM/DD/YY or Clearance TBD. Do not use dashes or single digits. - If request is for dental extraction, please clarify the # of teeth to be extracted.  Request for surgical clearance:  1. What type of surgery is being performed?  Bariatric and Gallbladder surgery    2. When is this surgery scheduled? TBD   3. What type of clearance is required (medical clearance vs. Pharmacy clearance to hold med vs. Both)? Medical  4. Are there any medications that need to be held prior to surgery and how long? N/A   5. Practice name and name of physician performing surgery? [facility]    6. What is the office phone number? [phone]   7.   What is the office fax number? [phone]  8.   Anesthesia type (None, local, MAC, general) ? General   Ewell Poe Ingalls 09/28/2019, 3:39 PM  _________________________________________________________________

## 2019-09-28 NOTE — Telephone Encounter (Signed)
   Primary Cardiologist: Jenkins Rouge, MD  Chart reviewed as part of pre-operative protocol coverage. Given past medical history and time since last visit, based on ACC/AHA guidelines, Savannah [name] would be at acceptable risk for the planned procedure without further cardiovascular testing.   I will route this recommendation to the requesting party via Epic fax function and remove from pre-op pool.  Please call with questions.  Jossie Ng. Katreena Schupp NP-C    09/28/2019, 4:00 PM Savannah [name] [address] Office [phone] Fax [phone]

## 2019-10-13 ENCOUNTER — Other Ambulatory Visit: Payer: Self-pay | Admitting: General Surgery

## 2019-10-13 ENCOUNTER — Other Ambulatory Visit (HOSPITAL_COMMUNITY): Payer: Self-pay | Admitting: General Surgery

## 2019-10-14 ENCOUNTER — Encounter: Payer: Self-pay | Admitting: Obstetrics & Gynecology

## 2019-10-23 ENCOUNTER — Other Ambulatory Visit: Payer: Self-pay

## 2019-10-23 ENCOUNTER — Encounter: Payer: BC Managed Care – PPO | Attending: General Surgery | Admitting: Dietician

## 2019-10-23 ENCOUNTER — Encounter: Payer: Self-pay | Admitting: Dietician

## 2019-10-23 DIAGNOSIS — E669 Obesity, unspecified: Secondary | ICD-10-CM | POA: Insufficient documentation

## 2019-10-23 NOTE — Progress Notes (Signed)
Nutrition Assessment for Bariatric Surgery Medical Nutrition Therapy   Patient was seen on 10/23/2019 for Pre-Operative Nutrition Assessment. Letter of approval faxed to [facility] bariatric surgery program coordinator on 10/23/2019.   Referral stated Supervised Weight Loss (SWL) visits needed: 0  Planned surgery: Sleeve Pt expectation of surgery: to qualify for knee replacement surgery   NUTRITION ASSESSMENT   Anthropometrics  Start weight at [facility]: 280 lbs (date: 10/23/2019) Height: 65 in BMI: 46.6 kg/m2     Lifestyle & Dietary Hx Typical meal pattern is 2-3 meals per day plus a snack and/or dessert. Will eat out for lunch a couple days per week, states she likes to eat salads when eating out. She primarily does the food shopping while her husband does the cooking. Likes cake and fruity candy, does not care for ice cream or chocolate. Takes a MVI and calcium.  Any previous deficiencies: vitamin D (hx of taking vitamin D, has not taken in 1.5 years, still in normal range now)  Micronutrient Nutrition Focused Physical Exam: Hair: no issues observed Eyes: no issues observed Mouth: no issues observed Neck: no issues observed Nails: no issues observed Skin: no issues observed  24-Hr Dietary Recall First Meal: bagel + cream cheese (or oatmeal + blueberries)  Snack: - Second Meal: steak salad w/o dressing (or sandwich + fries)  Snack: Skittles (or gummy bears)  Third Meal: baked chicken (or steak) + potato (or veggie)  Snack: -  Beverages: diet Coke (32 oz), Sprite Zero (12 oz), sweet tea (16 oz), water  Estimated Energy Needs Calories: 1600 Carbohydrate: 180g Protein: 100g Fat: 53g   NUTRITION DIAGNOSIS  Overweight/obesity ([place]-3.3) related to past poor dietary habits and physical inactivity as evidenced by patient w/ planned Sleeve surgery following dietary guidelines for continued weight loss.    NUTRITION INTERVENTION  Nutrition counseling (C-1) and  education (E-2) to facilitate bariatric surgery goals.  Pre-Op Goals Reviewed with the Patient . Track food and beverage intake (pen and paper, MyFitness Pal, Baritastic app, etc.) . Make healthy food choices while monitoring portion sizes . Consume 3 meals per day or try to eat every 3-5 hours . Avoid concentrated sugars and fried foods . Keep sugar & fat in the single digits per serving on food labels . Practice CHEWING your food (aim for applesauce consistency) . Practice not drinking 15 minutes before, during, and 30 minutes after each meal and snack . Avoid all carbonated beverages (ex: soda, sparkling beverages)  . Limit caffeinated beverages (ex: coffee, tea, energy drinks) . Avoid all sugar-sweetened beverages (ex: regular soda, sports drinks)  . Avoid alcohol  . Aim for 64-100 ounces of FLUID daily (with at least half of fluid intake being plain water)  . Aim for at least 60-80 grams of PROTEIN daily . Look for a liquid protein source that contains ?15 g protein and ?5 g carbohydrate (ex: shakes, drinks, shots) . Make a list of non-food related activities . Physical activity is an important part of a healthy lifestyle so keep it moving! The goal is to reach 150 minutes of exercise per week, including cardiovascular and weight baring activity.  Handouts Provided Include  . Bariatric Surgery handouts (Nutrition Visits, Pre-Op Goals, Protein Shakes, Vitamins & Minerals)  Learning Style & Readiness for Change Teaching method utilized: Visual & Auditory  Demonstrated degree of understanding via: Teach Back  Barriers to learning/adherence to lifestyle change: None Identified   MONITORING & EVALUATION Dietary intake, weekly physical activity, body weight, and pre-op goals  reached at next nutrition visit.   Next Steps Patient is to follow up at [facility] for Pre-Op Class (>2 weeks before surgery) for further nutrition education.

## 2019-10-27 ENCOUNTER — Ambulatory Visit (HOSPITAL_COMMUNITY): Payer: BC Managed Care – PPO

## 2019-10-30 ENCOUNTER — Ambulatory Visit (HOSPITAL_COMMUNITY)
Admission: RE | Admit: 2019-10-30 | Discharge: 2019-10-30 | Disposition: A | Discharge status: Home / Self Care | Payer: BC Managed Care – PPO | Source: Ambulatory Visit | Attending: General Surgery | Admitting: General Surgery

## 2019-10-30 ENCOUNTER — Other Ambulatory Visit: Payer: Self-pay

## 2019-11-01 ENCOUNTER — Other Ambulatory Visit: Payer: Self-pay | Admitting: Cardiovascular Disease

## 2019-12-21 ENCOUNTER — Other Ambulatory Visit: Payer: Self-pay

## 2019-12-21 ENCOUNTER — Encounter: Payer: BC Managed Care – PPO | Attending: General Surgery | Admitting: Skilled Nursing Facility1

## 2019-12-21 DIAGNOSIS — E669 Obesity, unspecified: Secondary | ICD-10-CM | POA: Insufficient documentation

## 2019-12-22 NOTE — Progress Notes (Signed)
Pre-Operative Nutrition Class:  Appt start time: 4621   End time:  1830.  Patient was seen on 12/21/2019 for Pre-Operative Bariatric Surgery Education [facility]    Surgery date: 02/02/2020 Surgery type: Sleeve Start weight at [facility]: 280 lb Weight today: 274.1 lb  Samples given per MNT protocol. Patient educated on appropriate usage:   Multivitamin- procare Lot [id number] Exp:02/2021   Calcium - celebrate Lot [id number] Exp:05/26/2021   Protein 20  Shake Lot #[id number] Exp: 11/18/2020   The following the learning objectives were met by the patient during this course:  Identify Pre-Op Dietary Goals and will begin 2 weeks pre-operatively  Identify appropriate sources of fluids and proteins   State protein recommendations and appropriate sources pre and post-operatively  Identify Post-Operative Dietary Goals and will follow for 2 weeks post-operatively  Identify appropriate multivitamin and calcium sources  Describe the need for physical activity post-operatively and will follow MD recommendations  State when to call healthcare provider regarding medication questions or post-operative complications  Handouts given during class include:  Pre-Op Bariatric Surgery Diet Handout  Protein Shake Handout  Post-Op Bariatric Surgery Nutrition Handout  [facility] Program Information Flyer  Support Group Information Flyer  [facility] Bariatric Supplements Price List  Follow-Up Plan: Patient will follow-up at [facility] 2 weeks post operatively for diet advancement per MD.

## 2019-12-25 ENCOUNTER — Encounter: Payer: Self-pay | Admitting: Neurology

## 2019-12-28 NOTE — Telephone Encounter (Signed)
Looks like Effexor ER and relpax are the medications we prescribe for her.

## 2019-12-31 ENCOUNTER — Other Ambulatory Visit: Payer: Self-pay | Admitting: Neurology

## 2019-12-31 MED ORDER — VENLAFAXINE HCL ER 37.5 MG PO CP24
ORAL_CAPSULE | ORAL | 0 refills | Status: DC
Start: 2019-12-31 — End: 2020-02-03

## 2020-01-13 HISTORY — PX: LAPAROSCOPIC GASTRIC SLEEVE RESECTION: SHX5895

## 2020-01-20 ENCOUNTER — Ambulatory Visit: Payer: Self-pay | Admitting: General Surgery

## 2020-01-20 NOTE — H&P (View-Only) (Signed)
Savannah [name] Appointment: 01/20/2020 8:45 AM Location: [facility] Patient #: [id number] DOB: [birth year] Married / Language: Savannah [name] / Race: White Female  History of Present Illness Savannah Hiss M. Savannah Mellin [name]; 01/20/2020 9:14 AM) The patient is a 59 year old female who presents for a bariatric surgery evaluation.She comes in for follow-up regarding her obesity and comorbidities. She has completed the bariatric surgery pathway. She has received psychological and nutritional clearance. She denies any medical changes since we initially saw her. She denies any trips to the emergency room or hospital. She denies any chest pain, chest pressure, shortness of breath, orthopnea. Her dyspnea exertion is unchanged. We received cardiac clearance from her cardiologist. Her upper GI showed a small hiatal hernia. Her nuclear medicine scan of her gallbladder showed an ejection fraction of 31%. She has modified her diet and has less gallbladder episodes but still has them occasionally. She denies any melena or hematochezia. She denies any fever or chills. Her bariatric evaluation labs were unremarkable except for hemoglobin A1c of 6.4 and HDL level of 38 She did have her colonoscopy and was found to have 1 polyp with a 10 year recall date  10/01/19 She is referred by Dr. Edwinna [name] to discuss weight loss surgery. She completed our seminar online. She is interested in sleeve over bypass. Despite numerous attempts for sustained weight loss she has been unsuccessful. She has done Atkins, Weight Watchers, low calorie diets, curves, noom, she is also worked with the [place] [facility] healthy weight and wellness for many months - all without sustained weight loss. She is interested in improving her overall health. She also has severe left knee arthritis and is in need of knee replacement  Comorbidities include CHF, hepatic steatosis, biliary dyskinesia, left knee osteoarthritis  She  denies any chest pain, or chest pressure. She does sleep on one pillow. She does have dyspnea on exertion. She denies any paroxysmal nocturnal dyspnea. Her cardiologist is Savannah [name]. He states that she has an echo on August 13. She states that she also had cardiac stress test from August 2020 which I reviewed. EF was 51%. Low risk study. No evidence of ischemia or infarction, normal study. Her echocardiogram from September 25, 2019 showed only a mildly reduced EF, slightly better than previous, continue current medical treatment. She may have some swelling around her feet and ankle was put on Lasix. Sleep apnea questionnaire was low risk.  She has rare reflux. She may take Pepcid once a month. She states that she has fatty liver. She has a colonoscopy with Savannah [name] on September 1. She has a bowel movement daily. She denies any melena or hematochezia. She was in the emergency room in May with right-sided pain that is been going on for several days along with vomiting and nausea. She had an ultrasound on August 3 which I reviewed which showed a fatty liver but normal gallbladder. This was followed by nuclear medicine scan that showed a calculated gallbladder ejection fraction of 31%  She's had a hysterectomy and appendectomy. She has general osteoarthritis in both knees of the left is worse. She states that she may have prediabetes. She has migraines about once a week. She denies any TIAs or amaurosis fugax.  She denies any tobacco, drugs or alcohol.  She had a normal comprehensive metabolic panel and CBC in May 2021 which I reviewed   Problem List/Past Medical Savannah [name]; 01/20/2020 9:14 AM) PREDIABETES (R73.03) KNEE OSTEOMYELITIS, LEFT (M86.9) CHRONIC CONGESTIVE HEART  FAILURE, UNSPECIFIED HEART FAILURE TYPE (I50.9) LOW HDL (UNDER 40) (E78.6) BILIARY DYSKINESIA (K82.8) SEVERE OBESITY (E66.01) HIATAL HERNIA (K44.9)  Past Surgical History Savannah [name];  01/20/2020 9:14 AM) Appendectomy Hysterectomy (due to cancer) - Complete Hysterectomy (not due to cancer) - Complete Knee Surgery Right. Oral Surgery Hysterectomy (not due to cancer) - Partial  Diagnostic Studies History Savannah [name]; 01/20/2020 9:14 AM) Colonoscopy 5-10 years ago 1-5 years ago Mammogram within last year Pap Smear 1-5 years ago  Allergies Savannah [name], Savannah [name]; 01/20/2020 8:41 AM) No Known Drug Allergies [06/25/2014]: Allergies Reconciled  Medication History Savannah [name], Savannah [name]; 01/20/2020 8:41 AM) Flurbiprofen (100MG  Tablet, Oral as needed) Active. Robaxin-750 (750MG  Tablet, Oral as needed) Active. Fish Oil (daily) Active. Flurbiprofen Active. Relpax (40MG  Tablet, Oral) Active. Calcium (1500MG  Tablet, Oral) Active. Clobetasol Propionate (0.05% Cream, External) Active. Benadryl Allergy (25MG  Capsule, Oral) Active. Omega-3 Fish Oil (500MG  Capsule, Oral) Active. Vitamin D (50000UNIT Capsule, Oral) Active. Paxil (10MG  Tablet, Oral) Active. Carvedilol (6.25MG  Tablet, Oral) Active. Furosemide (20MG  Tablet, Oral) Active. Venlafaxine HCl ER (75MG  Capsule ER 24HR, Oral) Active. Centrum Silver (Oral) Active. Glucosamine (500MG  Tablet, Oral) Active. Lisinopril (10MG  Tablet, Oral) Active. Ocuvite (Oral) Active. Medications Reconciled  Social History Savannah [name]; 01/20/2020 9:14 AM) Alcohol use Remotely quit alcohol use. Caffeine use Carbonated beverages, Tea. No drug use Tobacco use Never smoker.  Family History Savannah [name]; 01/20/2020 9:14 AM) Anesthetic complications Mother. Arthritis Mother. Cancer Mother. Heart Disease Father. Hypertension Father, Mother. Migraine Headache Brother. Arthritis Mother. Melanoma Mother.  Pregnancy / Birth History Savannah [name]; 01/20/2020 9:14 AM) Age at menarche 38 years. Age of menopause [age] Contraceptive History Oral contraceptives. Gravida 0 Para  0 Regular periods  Other Problems Savannah [name]; 01/20/2020 9:14 AM) Arthritis Congestive Heart Failure General anesthesia - complications Migraine Headache Oophorectomy Left, Right. Back Pain     Review of Systems Savannah Hiss M. Ailah Barna [name]; 01/20/2020 9:11 AM) General Present- Fatigue and Weight Gain. Not Present- Appetite Loss, Chills, Fever, Night Sweats and Weight Loss. Skin Present- Rash. Not Present- Change in Wart/Mole, Dryness, Hives, Jaundice, New Lesions, Non-Healing Wounds and Ulcer. HEENT Present- Hearing Loss and Wears glasses/contact lenses. Not Present- Earache, Hoarseness, Nose Bleed, Oral Ulcers, Ringing in the Ears, Seasonal Allergies, Sinus Pain, Sore Throat, Visual Disturbances and Yellow Eyes. Respiratory Not Present- Bloody sputum, Chronic Cough, Difficulty Breathing, Snoring and Wheezing. Breast Not Present- Breast Mass, Breast Pain, Nipple Discharge and Skin Changes. Cardiovascular Present- Shortness of Breath and Swelling of Extremities. Not Present- Chest Pain, Difficulty Breathing Lying Down, Leg Cramps, Palpitations and Rapid Heart Rate. Gastrointestinal Not Present- Abdominal Pain, Bloating, Bloody Stool, Change in Bowel Habits, Chronic diarrhea, Constipation, Difficulty Swallowing, Excessive gas, Gets full quickly at meals, Hemorrhoids, Indigestion, Nausea, Rectal Pain and Vomiting. Female Genitourinary Present- Urgency. Not Present- Frequency, Nocturia, Painful Urination and Pelvic Pain. Musculoskeletal Present- Joint Pain, Joint Stiffness and Muscle Pain. Not Present- Back Pain, Muscle Weakness and Swelling of Extremities. Neurological Present- Headaches. Not Present- Decreased Memory, Fainting, Numbness, Seizures, Tingling, Tremor, Trouble walking and Weakness. Psychiatric Not Present- Anxiety, Bipolar, Change in Sleep Pattern, Depression, Fearful and Frequent crying. Endocrine Not Present- Cold Intolerance, Excessive Hunger, Hair Changes, Heat  Intolerance, Hot flashes and New Diabetes. Hematology Present- Easy Bruising. Not Present- Blood Thinners, Excessive bleeding, Gland problems, HIV and Persistent Infections.  Vitals [place] Savannah [name]; 01/20/2020 8:42 AM) 01/20/2020 8:41 AM Weight: 274 lb Height: 65in Body Surface Area: 2.26 m Body Mass Index: 45.6  kg/m  Temp.: 97.38F  Pulse: 68 (Regular)  P.OX: 92% (Room air) BP: 122/78(Sitting, Left Arm, Standard)        Physical Exam Savannah Hiss M. Johneisha Broaden [name]; 01/20/2020 9:11 AM)  The physical exam findings are as follows: Note:severe obesity  General Mental Status-Alert. General Appearance-Consistent with stated age. Hydration-Well hydrated. Voice-Normal.  Head and Neck Head-normocephalic, atraumatic with no lesions or palpable masses. Trachea-midline. Thyroid Gland Characteristics - normal size and consistency.  Eye Eyeball - Bilateral-Extraocular movements intact. Sclera/Conjunctiva - Bilateral-No scleral icterus.  Chest and Lung Exam Chest and lung exam reveals -quiet, even and easy respiratory effort with no use of accessory muscles and on auscultation, normal breath sounds, no adventitious sounds and normal vocal resonance. Inspection Chest Wall - Normal. Back - normal.  Breast - Did not examine.  Cardiovascular Cardiovascular examination reveals -normal heart sounds, regular rate and rhythm with no murmurs and normal pedal pulses bilaterally.  Abdomen Inspection Inspection of the abdomen reveals - No Hernias. Skin - Scar - Right Lower Quadrant. Note: old transverse suprapubic incision. Palpation/Percussion Palpation and Percussion of the abdomen reveal - Soft, Non Tender, No Rebound tenderness, No Rigidity (guarding) and No hepatosplenomegaly. Auscultation Auscultation of the abdomen reveals - Bowel sounds normal.  Peripheral Vascular Upper Extremity Palpation - Pulses bilaterally normal.  Neurologic Neurologic evaluation  reveals -alert and oriented x 3 with no impairment of recent or remote memory. Mental Status-Normal.  Neuropsychiatric The patient's mood and affect are described as -normal. Judgment and Insight-insight is appropriate concerning matters relevant to self.  Musculoskeletal Normal Exam - Left-Upper Extremity Strength Normal and Lower Extremity Strength Normal. Normal Exam - Right-Upper Extremity Strength Normal and Lower Extremity Strength Normal.  Lymphatic Head & Neck  General Head & Neck Lymphatics: Bilateral - Description - Normal. Axillary - Did not examine. Femoral & Inguinal - Did not examine.    Assessment & Plan Savannah Hiss M. Aala Ransom [name]; 01/20/2020 9:14 AM)  SEVERE OBESITY (E66.01) Impression: The patient meets weight loss surgery criteria. I think the patient would be an acceptable candidate for Laparoscopic vertical sleeve gastrectomy with possible hiatal hernia repair.  We reviewed her workup. We discussed relapse. We rediscussed the typical hospitalization as well as the typical postoperative recovery course. We discussed the importance of the preoperative meal plan. She has completed and attended her preoperative education class. I asked and answered all her questions.  This patient encounter took 27 minutes today to perform the following: take history, perform exam, review outside records, interpret imaging, counsel the patient on their diagnosis and document encounter, findings & plan in the EHR  Current Plans Pt Education - EMW_preopbariatric  BILIARY DYSKINESIA (K82.8) Impression: Her nuclear medicine scan was positive. Her gallbladder ejection fraction was 31% PsyD think her symptoms are consistent with biliary dyskinesia. We will proceed with same day cholecystectomy. We have previously discussed the steps along with the risk and benefits of cholecystectomy. I offered to rediscussed she declined.   CHRONIC CONGESTIVE HEART FAILURE, UNSPECIFIED HEART FAILURE  TYPE (I50.9)   KNEE OSTEOMYELITIS, LEFT (M86.9)   PREDIABETES (R73.03)   LOW HDL (UNDER 40) (E78.6)   HIATAL HERNIA (K44.9) Impression: We discussed the findings of a small hiatal hernia. I discussed that we would test for one intraoperatively. We discussed the anatomy. If found to have a true sliding hiatal hernia, we will repair it. I discussed the steps of the repair  Leighton Ruff. Savannah [name], FACS General, Bariatric, & Minimally Invasive Surgery [facility], Savannah [name]

## 2020-01-20 NOTE — H&P (Signed)
Savannah [name] Savannah [name]: 01/20/2020 8:45 AM Location: [facility] Patient #: [id number] DOB: [birth year] Married / Language: Savannah [name] / Race: White Female  History of Present Illness Savannah Hiss M. Miquel Stacks MD; 01/20/2020 9:14 AM) The patient is a 59 year old female who presents for a bariatric surgery evaluation.She comes in for follow-up regarding her obesity and comorbidities. She has completed the bariatric surgery pathway. She has received psychological and nutritional clearance. She denies any medical changes since we initially saw her. She denies any trips to the emergency room or hospital. She denies any chest pain, chest pressure, shortness of breath, orthopnea. Her dyspnea exertion is unchanged. We received cardiac clearance from her cardiologist. Her upper GI showed a small hiatal hernia. Her nuclear medicine scan of her gallbladder showed an ejection fraction of 31%. She has modified her diet and has less gallbladder episodes but still has them occasionally. She denies any melena or hematochezia. She denies any fever or chills. Her bariatric evaluation labs were unremarkable except for hemoglobin A1c of 6.4 and HDL level of 38 She did have her colonoscopy and was found to have 1 polyp with a 10 year recall date  10/01/19 She is referred by Savannah [name] to discuss weight loss surgery. She completed our seminar online. She is interested in sleeve over bypass. Despite numerous attempts for sustained weight loss she has been unsuccessful. She has done Atkins, Weight Watchers, low calorie diets, curves, noom, she is also worked with the [facility] healthy weight and wellness for many months - all without sustained weight loss. She is interested in improving her overall health. She also has severe left knee arthritis and is in need of knee replacement  Comorbidities include CHF, hepatic steatosis, biliary dyskinesia, left knee osteoarthritis  She  denies any chest pain, or chest pressure. She does sleep on one pillow. She does have dyspnea on exertion. She denies any paroxysmal nocturnal dyspnea. Her cardiologist is Savannah [name]. He states that she has an echo on August 13. She states that she also had cardiac stress test from August 2020 which I reviewed. EF was 51%. Low risk study. No evidence of ischemia or infarction, normal study. Her echocardiogram from September 25, 2019 showed only a mildly reduced EF, slightly better than previous, continue current medical treatment. She may have some swelling around her feet and ankle was put on Lasix. Sleep apnea questionnaire was low risk.  She has rare reflux. She may take Pepcid once a month. She states that she has fatty liver. She has a colonoscopy with Dr. Collene Mares on September 1. She has a bowel movement daily. She denies any melena or hematochezia. She was in the emergency room in May with right-sided pain that is been going on for several days along with vomiting and nausea. She had an ultrasound on August 3 which I reviewed which showed a fatty liver but normal gallbladder. This was followed by nuclear medicine scan that showed a calculated gallbladder ejection fraction of 31%  She's had a hysterectomy and appendectomy. She has general osteoarthritis in both knees of the left is worse. She states that she may have prediabetes. She has migraines about once a week. She denies any TIAs or amaurosis fugax.  She denies any tobacco, drugs or alcohol.  She had a normal comprehensive metabolic panel and CBC in May 2021 which I reviewed   Problem List/Past Medical Savannah Hiss M. Redmond Pulling, MD; 01/20/2020 9:14 AM) PREDIABETES (R73.03) KNEE OSTEOMYELITIS, LEFT (M86.9) CHRONIC CONGESTIVE HEART  FAILURE, UNSPECIFIED HEART FAILURE TYPE (I50.9) LOW HDL (UNDER 40) (E78.6) BILIARY DYSKINESIA (K82.8) SEVERE OBESITY (E66.01) HIATAL HERNIA (K44.9)  Past Surgical History Savannah Hiss M. Redmond Pulling, MD;  01/20/2020 9:14 AM) Appendectomy Hysterectomy (due to cancer) - Complete Hysterectomy (not due to cancer) - Complete Knee Surgery Right. Oral Surgery Hysterectomy (not due to cancer) - Partial  Diagnostic Studies History Savannah Hiss M. Redmond Pulling, MD; 01/20/2020 9:14 AM) Colonoscopy 5-10 years ago 1-5 years ago Mammogram within last year Pap Smear 1-5 years ago  Allergies Darden Palmer, RMA; 01/20/2020 8:41 AM) No Known Drug Allergies [06/25/2014]: Allergies Reconciled  Medication History Darden Palmer, [place]; 01/20/2020 8:41 AM) Flurbiprofen (100MG  Tablet, Oral as needed) Active. Robaxin-750 (750MG  Tablet, Oral as needed) Active. Fish Oil (daily) Active. Flurbiprofen Active. Relpax (40MG  Tablet, Oral) Active. Calcium (1500MG  Tablet, Oral) Active. Clobetasol Propionate (0.05% Cream, External) Active. Benadryl Allergy (25MG  Capsule, Oral) Active. Omega-3 Fish Oil (500MG  Capsule, Oral) Active. Vitamin D (50000UNIT Capsule, Oral) Active. Paxil (10MG  Tablet, Oral) Active. Carvedilol (6.25MG  Tablet, Oral) Active. Furosemide (20MG  Tablet, Oral) Active. Venlafaxine HCl ER (75MG  Capsule ER 24HR, Oral) Active. Centrum Silver (Oral) Active. Glucosamine (500MG  Tablet, Oral) Active. Lisinopril (10MG  Tablet, Oral) Active. Ocuvite (Oral) Active. Medications Reconciled  Social History Savannah Hiss M. Redmond Pulling, MD; 01/20/2020 9:14 AM) Alcohol use Remotely quit alcohol use. Caffeine use Carbonated beverages, Tea. No drug use Tobacco use Never smoker.  Family History Savannah Hiss M. Redmond Pulling, MD; 01/20/2020 9:14 AM) Anesthetic complications Mother. Arthritis Mother. Cancer Mother. Heart Disease Father. Hypertension Father, Mother. Migraine Headache Brother. Arthritis Mother. Melanoma Mother.  Pregnancy / Birth History Savannah Hiss M. Redmond Pulling, MD; 01/20/2020 9:14 AM) Age at menarche 10 years. Age of menopause [age] Contraceptive History Oral contraceptives. Gravida 0 Para  0 Regular periods  Other Problems Savannah Hiss M. Redmond Pulling, MD; 01/20/2020 9:14 AM) Arthritis Congestive Heart Failure General anesthesia - complications Migraine Headache Oophorectomy Left, Right. Back Pain     Review of Systems Savannah Hiss M. Mickeal Daws MD; 01/20/2020 9:11 AM) General Present- Fatigue and Weight Gain. Not Present- Appetite Loss, Chills, Fever, Night Sweats and Weight Loss. Skin Present- Rash. Not Present- Change in Wart/Mole, Dryness, Hives, Jaundice, New Lesions, Non-Healing Wounds and Ulcer. HEENT Present- Hearing Loss and Wears glasses/contact lenses. Not Present- Earache, Hoarseness, Nose Bleed, Oral Ulcers, Ringing in the Ears, Seasonal Allergies, Sinus Pain, Sore Throat, Visual Disturbances and Yellow Eyes. Respiratory Not Present- Bloody sputum, Chronic Cough, Difficulty Breathing, Snoring and Wheezing. Breast Not Present- Breast Mass, Breast Pain, Nipple Discharge and Skin Changes. Cardiovascular Present- Shortness of Breath and Swelling of Extremities. Not Present- Chest Pain, Difficulty Breathing Lying Down, Leg Cramps, Palpitations and Rapid Heart Rate. Gastrointestinal Not Present- Abdominal Pain, Bloating, Bloody Stool, Change in Bowel Habits, Chronic diarrhea, Constipation, Difficulty Swallowing, Excessive gas, Gets full quickly at meals, Hemorrhoids, Indigestion, Nausea, Rectal Pain and Vomiting. Female Genitourinary Present- Urgency. Not Present- Frequency, Nocturia, Painful Urination and Pelvic Pain. Musculoskeletal Present- Joint Pain, Joint Stiffness and Muscle Pain. Not Present- Back Pain, Muscle Weakness and Swelling of Extremities. Neurological Present- Headaches. Not Present- Decreased Memory, Fainting, Numbness, Seizures, Tingling, Tremor, Trouble walking and Weakness. Psychiatric Not Present- Anxiety, Bipolar, Change in Sleep Pattern, Depression, Fearful and Frequent crying. Endocrine Not Present- Cold Intolerance, Excessive Hunger, Hair Changes, Heat  Intolerance, Hot flashes and New Diabetes. Hematology Present- Easy Bruising. Not Present- Blood Thinners, Excessive bleeding, Gland problems, HIV and Persistent Infections.  Vitals Lattie Haw Virgil RMA; 01/20/2020 8:42 AM) 01/20/2020 8:41 AM Weight: 274 lb Height: 65in Body Surface Area: 2.26 m Body Mass Index: 45.6  kg/m  Temp.: 97.63F  Pulse: 68 (Regular)  P.OX: 92% (Room air) BP: 122/78(Sitting, Left Arm, Standard)        Physical Exam Savannah Hiss M. Kanai Berrios MD; 01/20/2020 9:11 AM)  The physical exam findings are as follows: Note:severe obesity  General Mental Status-Alert. General Appearance-Consistent with stated age. Hydration-Well hydrated. Voice-Normal.  Head and Neck Head-normocephalic, atraumatic with no lesions or palpable masses. Trachea-midline. Thyroid Gland Characteristics - normal size and consistency.  Eye Eyeball - Bilateral-Extraocular movements intact. Sclera/Conjunctiva - Bilateral-No scleral icterus.  Chest and Lung Exam Chest and lung exam reveals -quiet, even and easy respiratory effort with no use of accessory muscles and on auscultation, normal breath sounds, no adventitious sounds and normal vocal resonance. Inspection Chest Wall - Normal. Back - normal.  Breast - Did not examine.  Cardiovascular Cardiovascular examination reveals -normal heart sounds, regular rate and rhythm with no murmurs and normal pedal pulses bilaterally.  Abdomen Inspection Inspection of the abdomen reveals - No Hernias. Skin - Scar - Right Lower Quadrant. Note: old transverse suprapubic incision. Palpation/Percussion Palpation and Percussion of the abdomen reveal - Soft, Non Tender, No Rebound tenderness, No Rigidity (guarding) and No hepatosplenomegaly. Auscultation Auscultation of the abdomen reveals - Bowel sounds normal.  Peripheral Vascular Upper Extremity Palpation - Pulses bilaterally normal.  Neurologic Neurologic evaluation  reveals -alert and oriented x 3 with no impairment of recent or remote memory. Mental Status-Normal.  Neuropsychiatric The patient's mood and affect are described as -normal. Judgment and Insight-insight is appropriate concerning matters relevant to self.  Musculoskeletal Normal Exam - Left-Upper Extremity Strength Normal and Lower Extremity Strength Normal. Normal Exam - Right-Upper Extremity Strength Normal and Lower Extremity Strength Normal.  Lymphatic Head & Neck  General Head & Neck Lymphatics: Bilateral - Description - Normal. Axillary - Did not examine. Femoral & Inguinal - Did not examine.    Assessment & Plan Savannah Hiss M. Amadu Schlageter MD; 01/20/2020 9:14 AM)  SEVERE OBESITY (E66.01) Impression: The patient meets weight loss surgery criteria. I think the patient would be an acceptable candidate for Laparoscopic vertical sleeve gastrectomy with possible hiatal hernia repair.  We reviewed her workup. We discussed relapse. We rediscussed the typical hospitalization as well as the typical postoperative recovery course. We discussed the importance of the preoperative meal plan. She has completed and attended her preoperative education class. I asked and answered all her questions.  This patient encounter took 27 minutes today to perform the following: take history, perform exam, review outside records, interpret imaging, counsel the patient on their diagnosis and document encounter, findings & plan in the EHR  Current Plans Pt Education - EMW_preopbariatric  BILIARY DYSKINESIA (K82.8) Impression: Her nuclear medicine scan was positive. Her gallbladder ejection fraction was 31% PsyD think her symptoms are consistent with biliary dyskinesia. We will proceed with same day cholecystectomy. We have previously discussed the steps along with the risk and benefits of cholecystectomy. I offered to rediscussed she declined.   CHRONIC CONGESTIVE HEART FAILURE, UNSPECIFIED HEART FAILURE  TYPE (I50.9)   KNEE OSTEOMYELITIS, LEFT (M86.9)   PREDIABETES (R73.03)   LOW HDL (UNDER 40) (E78.6)   HIATAL HERNIA (K44.9) Impression: We discussed the findings of a small hiatal hernia. I discussed that we would test for one intraoperatively. We discussed the anatomy. If found to have a true sliding hiatal hernia, we will repair it. I discussed the steps of the repair  Leighton Ruff. Redmond Pulling, MD, FACS General, Bariatric, & Minimally Invasive Surgery [facility], [place]

## 2020-01-26 NOTE — Patient Instructions (Addendum)
DUE TO COVID-19 ONLY ONE VISITOR IS ALLOWED TO COME WITH YOU AND STAY IN THE WAITING ROOM ONLY DURING PRE OP AND PROCEDURE DAY OF SURGERY. THE 1 VISITOR  MAY VISIT WITH YOU AFTER SURGERY IN YOUR PRIVATE ROOM DURING VISITING HOURS ONLY!  YOU NEED TO HAVE A COVID 19 TEST ON_12/17______ @_2 :30______, THIS TEST MUST BE DONE BEFORE SURGERY,  COVID TESTING SITE [place] [id number], IT IS ON THE RIGHT GOING OUT [address] APPROXIMATELY  2 MINUTES PAST [employer] ON THE RIGHT. ONCE YOUR COVID TEST IS COMPLETED,  PLEASE BEGIN THE QUARANTINE INSTRUCTIONS AS OUTLINED IN YOUR HANDOUT.                Savannah [name]    Your procedure is scheduled on: 02/02/20   Report to [facility] Main  Entrance   Report to Short Stay at 5:30 AM     Call this number if you have problems the morning of surgery [place], NO CHEWING GUM [place].   MORNING OF SURGERY DRINK:   DRINK 1 G2 drink BEFORE YOU LEAVE HOME, DRINK ALL OF THE  G2 DRINK AT ONE TIME.    NO SOLID FOOD AFTER 6:00 PM THE NIGHT BEFORE YOUR SURGERY.   YOU MAY DRINK CLEAR FLUIDS. THE G2 DRINK YOU DRINK BEFORE YOU LEAVE HOME WILL BE THE LAST FLUIDS YOU DRINK BEFORE SURGERY.  PAIN IS EXPECTED AFTER SURGERY AND WILL NOT BE COMPLETELY ELIMINATED.   AMBULATION AND TYLENOL WILL HELP REDUCE INCISIONAL AND GAS PAIN. MOVEMENT IS KEY!  YOU ARE EXPECTED TO BE OUT OF BED WITHIN 4 HOURS OF ADMISSION TO YOUR PATIENT ROOM.  SITTING IN THE RECLINER THROUGHOUT THE DAY IS IMPORTANT FOR DRINKING FLUIDS AND MOVING GAS THROUGHOUT THE GI TRACT.  COMPRESSION STOCKINGS SHOULD BE WORN [place] UNLESS YOU ARE WALKING.   INCENTIVE SPIROMETER SHOULD BE USED EVERY HOUR WHILE AWAKE TO DECREASE POST-OPERATIVE COMPLICATIONS SUCH AS PNEUMONIA.  WHEN DISCHARGED HOME, IT IS IMPORTANT TO CONTINUE TO WALK EVERY HOUR AND USE THE INCENTIVE  SPIROMETER EVERY HOUR.       Take these medicines the morning of surgery with A SIP OF WATER: Carvedilol                                 You may not have any metal on your body including hair pins and              piercings  Do not wear jewelry, make-up, lotions, powders or perfumes, deodorant             Do not wear nail polish on your fingernails.  Do not shave  48 hours prior to surgery.     Do not bring valuables to the hospital. [place].  Contacts, dentures or bridgework may not be worn into surgery.                   [place] - Preparing for Surgery Before surgery, you can play an important role.   Because skin is not sterile, your skin needs to be as free of germs as possible.   You can reduce the number of germs on your skin  by washing with CHG (chlorahexidine gluconate) soap before surgery.   CHG is an antiseptic cleaner which kills germs and bonds with the skin to continue killing germs even after washing. Please DO NOT use if you have an allergy to CHG or antibacterial soaps.   If your skin becomes reddened/irritated stop using the CHG and inform your nurse when you arrive at Short Stay. Do not shave (including legs and underarms) for at least 48 hours prior to the first CHG shower.   Please follow these instructions carefully:  1.  Shower with CHG Soap the night before surgery and the  morning of Surgery.  2.  If you choose to wash your hair, wash your hair first as usual with your  normal  shampoo.  3.  After you shampoo, rinse your hair and body thoroughly to remove the  shampoo.                                        4.  Use CHG as you would any other liquid soap.  You can apply chg directly  to the skin and wash                       Gently with a scrungie or clean washcloth.  5.  Apply the CHG Soap to your body ONLY FROM THE NECK DOWN.   Do not use on face/ open                           Wound or open  sores. Avoid contact with eyes, ears mouth and genitals (private parts).                       Wash face,  Genitals (private parts) with your normal soap.             6.  Wash thoroughly, paying special attention to the area where your surgery  will be performed.  7.  Thoroughly rinse your body with warm water from the neck down.  8.  DO NOT shower/wash with your normal soap after using and rinsing off  the CHG Soap.             9.  Pat yourself dry with a clean towel.            10.  Wear clean pajamas.            11.  Place clean sheets on your bed the night of your first shower and do not  sleep with pets. Day of Surgery : Do not apply any lotions/deodorants the morning of surgery.  Please wear clean clothes to the hospital/surgery center.  FAILURE TO FOLLOW THESE INSTRUCTIONS MAY RESULT IN THE CANCELLATION OF YOUR SURGERY PATIENT SIGNATURE_________________________________  NURSE SIGNATURE__________________________________  ________________________________________________________________________

## 2020-01-28 ENCOUNTER — Encounter (HOSPITAL_COMMUNITY)
Admission: RE | Admit: 2020-01-28 | Discharge: 2020-01-28 | Disposition: A | Discharge status: Home / Self Care | Payer: BC Managed Care – PPO | Source: Ambulatory Visit | Attending: General Surgery | Admitting: General Surgery

## 2020-01-28 ENCOUNTER — Other Ambulatory Visit: Payer: Self-pay

## 2020-01-28 ENCOUNTER — Encounter (HOSPITAL_COMMUNITY): Payer: Self-pay

## 2020-01-28 DIAGNOSIS — Z01812 Encounter for preprocedural laboratory examination: Secondary | ICD-10-CM | POA: Insufficient documentation

## 2020-01-28 HISTORY — DX: Family history of other specified conditions: Z84.89

## 2020-01-28 HISTORY — DX: Personal history of other diseases of the digestive system: Z87.19

## 2020-01-28 HISTORY — DX: Cardiac arrhythmia, unspecified: I49.9

## 2020-01-28 LAB — COMPREHENSIVE METABOLIC PANEL
ALT: 44 U/L (ref 0–44)
AST: 35 U/L (ref 15–41)
Albumin: 4 g/dL (ref 3.5–5.0)
Alkaline Phosphatase: 102 U/L (ref 38–126)
Anion gap: 9 (ref 5–15)
BUN: 24 mg/dL — ABNORMAL HIGH (ref 6–20)
CO2: 23 mmol/L (ref 22–32)
Calcium: 9.8 mg/dL (ref 8.9–10.3)
Chloride: 107 mmol/L (ref 98–111)
Creatinine, Ser: 0.93 mg/dL (ref 0.44–1.00)
GFR, Estimated: >60 mL/min (ref >60)
Glucose, Bld: 102 mg/dL — ABNORMAL HIGH (ref 70–99)
Potassium: 3.8 mmol/L (ref 3.5–5.1)
Sodium: 139 mmol/L (ref 135–145)
Total Bilirubin: 0.8 mg/dL (ref 0.3–1.2)
Total Protein: 7.3 g/dL (ref 6.5–8.1)

## 2020-01-28 LAB — CBC WITH DIFFERENTIAL/PLATELET
Abs Immature Granulocytes: 0.03 10*3/uL (ref 0.00–0.07)
Basophils Absolute: 0 10*3/uL (ref 0.0–0.1)
Basophils Relative: 0 %
Eosinophils Absolute: 0.3 10*3/uL (ref 0.0–0.5)
Eosinophils Relative: 3 %
HCT: 42.5 % (ref 36.0–46.0)
Hemoglobin: 13.9 g/dL (ref 12.0–15.0)
Immature Granulocytes: 0 %
Lymphocytes Relative: 25 %
Lymphs Abs: 2.2 10*3/uL (ref 0.7–4.0)
MCH: 30 pg (ref 26.0–34.0)
MCHC: 32.7 g/dL (ref 30.0–36.0)
MCV: 91.8 fL (ref 80.0–100.0)
Monocytes Absolute: 0.7 10*3/uL (ref 0.1–1.0)
Monocytes Relative: 8 %
Neutro Abs: 5.7 10*3/uL (ref 1.7–7.7)
Neutrophils Relative %: 64 %
Platelets: 298 10*3/uL (ref 150–400)
RBC: 4.63 MIL/uL (ref 3.87–5.11)
RDW: 14 % (ref 11.5–15.5)
WBC: 9 10*3/uL (ref 4.0–10.5)
nRBC: 0 % (ref 0.0–0.2)

## 2020-01-28 NOTE — Progress Notes (Signed)
COVID Vaccine Completed:Yes Date COVID Vaccine completed:05/09/19, Booster 12/05/19 COVID vaccine manufacturer: Pfizer      PCP - Dr. Cristi Loron Cardiologist - Dr. Edmonia James  Chest x-ray - 10/30/19-Epic EKG - 09/10/19-Epic Stress Test - 09/15/18-Epic ECHO - no Cardiac Cath - no Pacemaker/ICD device last checked:NA  Sleep Study - no CPAP -   Fasting Blood Sugar -NA  Checks Blood Sugar _____ times a day  Blood Thinner Instructions:NA Aspirin Instructions: Last Dose:  Anesthesia review:   Patient denies shortness of breath, fever, cough and chest pain at PAT appointment yes   Patient verbalized understanding of instructions that were given to them at the PAT appointment. Patient was also instructed that they will need to review over the PAT instructions again at home before surgery. Yes. 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

## 2020-01-29 ENCOUNTER — Encounter (HOSPITAL_COMMUNITY): Payer: Self-pay

## 2020-01-29 ENCOUNTER — Other Ambulatory Visit (HOSPITAL_COMMUNITY)
Admission: RE | Admit: 2020-01-29 | Discharge: 2020-01-29 | Disposition: A | Discharge status: Home / Self Care | Payer: BC Managed Care – PPO | Source: Ambulatory Visit | Attending: General Surgery | Admitting: General Surgery

## 2020-01-29 DIAGNOSIS — Z20822 Contact with and (suspected) exposure to covid-19: Secondary | ICD-10-CM | POA: Insufficient documentation

## 2020-01-29 DIAGNOSIS — Z01812 Encounter for preprocedural laboratory examination: Secondary | ICD-10-CM | POA: Insufficient documentation

## 2020-01-29 NOTE — Progress Notes (Signed)
Anesthesia Chart Review   Case: [id number] Date/Time: 02/02/20 0700   Procedures:      XI ROBOTIC GASTRIC SLEEVE RESECTION (N/A )     UPPER GI ENDOSCOPY (N/A )   Anesthesia type: General   Pre-op diagnosis: MORBID OBESITY   Location: [facility] ROOM 02 / [facility]   Surgeons: [name], MD      DISCUSSION:59 y.o. never smoker with h/o PONV, CHF, morbid obesity scheduled for above procedure 02/02/2020 with Dr. [name].   Per cardiology preoperative risk assessment 09/28/2019, "Chart reviewed as part of pre-operative protocol coverage. Given past medical history and time since last visit, based on ACC/AHA guidelines, Cynithia Hakimi Force would be at acceptable risk for the planned procedure without further cardiovascular testing."  Anticipate pt can proceed with planned procedure barring acute status change.   VS: BP (!) 162/85   Pulse 84   Resp 18   Ht 5\' 5"  (1.651 m)   LMP 10/14/2010   SpO2 98%   BMI 45.61 kg/m   PROVIDERS: Lawerance Cruel, MD is PCP   Jenkins Rouge, MD is Cardiologist  LABS: Labs reviewed: Acceptable for surgery. (all labs ordered are listed, but only abnormal results are displayed)  Labs Reviewed  COMPREHENSIVE METABOLIC PANEL - Abnormal; Notable for the following components:      Result Value   Glucose, Bld 102 (*)    BUN 24 (*)    All other components within normal limits  CBC WITH DIFFERENTIAL/PLATELET  TYPE AND SCREEN     IMAGES:   EKG: 09/10/2019 Rate 115 bpm    CV: Echo 09/25/2019 IMPRESSIONS   1. Left ventricular ejection fraction, by estimation, is 50%. The left  ventricle has mildly reduced systolic function. Mild global hypokinesis.  Left ventricular diastolic parameters are consistent with Grade I  diastolic dysfunction (impaired  relaxation). GLS -23.2%, normal.  2. Right ventricular systolic function is normal. The right ventricular  size is normal. Tricuspid regurgitation signal is inadequate for assessing  PA pressure.  3. The  aortic valve is tricuspid. Aortic valve regurgitation is not  visualized. No aortic stenosis is present.  4. The mitral valve is normal in structure. No evidence of mitral valve  regurgitation. No evidence of mitral stenosis.  5. The inferior vena cava is normal in size with greater than 50%  respiratory variability, suggesting right atrial pressure of 3 mmHg.   Comparison(s): 09/10/17 EF 45-50%.  Myocardial Perfusion 09/15/2018  EF is 51 %. Visually, the EF appears to be greater than 51%  This is a low risk study. There is no evidence of ischemia or infarction .  The study is normal.    Past Medical History:  Diagnosis Date  . Arthritis    bilateral knees, recent LCL sprain-on left knee  . CHF (congestive heart failure) ([place])   . Dysrhythmia    trigem  . Environmental allergies   . Family history of adverse reaction to anesthesia    slow to wake up  . Fatty liver   . Headache(784.0)    migraines-on meds for control, relpax, ketoprophen, vicoden, muscle relaxer  . Hearing loss 2016   High frequency hearing - Bilateral   . History of hiatal hernia   . Hot flashes   . Joint pain   . Lichen sclerosus   . Lipoma    Right Knee  . Menorrhagia   . Migraines   . Necrobiosis lipoidica   . Osteoarthritis   . Overweight   . Pelvic  pain in female   . PONV (postoperative nausea and vomiting)    ponv, slow to awake  . Presence of permanent cardiac pacemaker    with activity  . Seasonal allergies   . Sinus problem   . Sprain 10/25/2010   left knee  . Trigeminal pulse   . Trigger finger, right    Middle finger and right wrist   . Vitamin D deficiency     Past Surgical History:  Procedure Laterality Date  . ABDOMINAL HYSTERECTOMY    . APPENDECTOMY    . DIAGNOSTIC LAPAROSCOPY  08/2006  . DILATION AND CURETTAGE OF UTERUS  2011   attempted ablation  . FUNCTIONAL ENDOSCOPIC SINUS SURGERY    . ganglion cyst removal    . HYSTEROSCOPY     failed  . KNEE ARTHROPLASTY Right  2018   meniscus  . LASIK    . LIPOMA EXCISION Right 07/22/2014   Procedure: EXCISION RIGHT THIGH LIPOMA;  Surgeon: Erroll Luna, MD;  Location: [facility];  Service: General;  Laterality: Right;  . ROBOTIC ASSISTED TOTAL HYSTERECTOMY  11/06/10   TLH/RSO  . SALPINGOOPHORECTOMY  11/06/2010   Procedure: SALPINGO OOPHERECTOMY;  Surgeon: Felipa Emory;  Location: [facility];  Service: Gynecology;  Laterality: Right;  . TRIGGER FINGER RELEASE      MEDICATIONS: . Calcium Carbonate-Vit D-Min (CALTRATE 600+D PLUS MINERALS) 600-800 MG-UNIT CHEW  . carvedilol (COREG) 6.25 MG tablet  . Cholecalciferol (VITAMIN D3) 50 MCG (2000 UT) TABS  . clobetasol cream (TEMOVATE) 0.05 %  . Collagen-Boron-Hyaluronic Acid (MOVE FREE ULTRA JOINT HEALTH) 40-5-3.3 MG TABS  . cyclobenzaprine (FLEXERIL) 10 MG tablet  . eletriptan (RELPAX) 40 MG tablet  . furosemide (LASIX) 20 MG tablet  . hyoscyamine (LEVSIN) 0.125 MG tablet  . lisinopril (ZESTRIL) 10 MG tablet  . meclizine (ANTIVERT) 25 MG tablet  . mometasone (ELOCON) 0.1 % ointment  . Multiple Vitamins-Minerals (BARIATRIC MULTIVITAMINS/IRON PO)  . Multiple Vitamins-Minerals (OCUVITE ADULT 50+ PO)  . naproxen (NAPROSYN) 500 MG tablet  . promethazine (PHENERGAN) 25 MG tablet  . venlafaxine XR (EFFEXOR XR) 37.5 MG 24 hr capsule   No current facility-administered medications for this encounter.     Konrad Felix, PA-C [facility] Testing [phone]

## 2020-01-30 LAB — SARS CORONAVIRUS 2 (TAT 6-24 HRS): SARS Coronavirus 2: NEGATIVE

## 2020-02-01 ENCOUNTER — Other Ambulatory Visit: Payer: Self-pay | Admitting: Cardiovascular Disease

## 2020-02-01 MED ORDER — BUPIVACAINE LIPOSOME 1.3 % IJ SUSP
20.0000 mL | Freq: Once | INTRAMUSCULAR | Status: DC
  Filled 2020-02-01: qty 20

## 2020-02-01 NOTE — Anesthesia Preprocedure Evaluation (Addendum)
Anesthesia Evaluation  Patient identified by MRN, date of birth, ID band Patient awake    Reviewed: Allergy & Precautions, H&P , NPO status , Patient's Chart, lab work & pertinent test results  History of Anesthesia Complications (+) PONV and history of anesthetic complications  Airway Mallampati: II       Dental no notable dental hx.    Pulmonary neg pulmonary ROS,    Pulmonary exam normal        Cardiovascular Normal cardiovascular exam     Neuro/Psych  Headaches (migraines with menstrual cycle), negative psych ROS   GI/Hepatic Neg liver ROS,   Endo/Other  negative endocrine ROS  Renal/GU negative Renal ROS  negative genitourinary   Musculoskeletal  (+) Arthritis  (bilateral knees),   Abdominal (+) + obese,   Peds  Hematology negative hematology ROS (+)   Anesthesia Other Findings   Reproductive/Obstetrics                            Anesthesia Physical  Anesthesia Plan  ASA: III  Anesthesia Plan: General   Post-op Pain Management:    Induction: Intravenous  PONV Risk Score and Plan: 4 or greater and Ondansetron, Dexamethasone and Scopolamine patch - Pre-op  Airway Management Planned: Oral ETT  Additional Equipment: None  Intra-op Plan:   Post-operative Plan: Extubation in OR  Informed Consent: I have reviewed the patients History and Physical, chart, labs and discussed the procedure including the risks, benefits and alternatives for the proposed anesthesia with the patient or authorized representative who has indicated his/her understanding and acceptance.     Dental advisory given  Plan Discussed with: CRNA  Anesthesia Plan Comments:        Anesthesia Quick Evaluation

## 2020-02-02 ENCOUNTER — Encounter (HOSPITAL_COMMUNITY)
Admission: RE | Disposition: A | Discharge status: Home / Self Care | Payer: Self-pay | Source: Home / Self Care | Attending: General Surgery

## 2020-02-02 ENCOUNTER — Encounter (HOSPITAL_COMMUNITY): Payer: Self-pay | Admitting: General Surgery

## 2020-02-02 ENCOUNTER — Other Ambulatory Visit: Payer: Self-pay

## 2020-02-02 ENCOUNTER — Inpatient Hospital Stay (HOSPITAL_COMMUNITY): Payer: BC Managed Care – PPO | Admitting: Certified Registered Nurse Anesthetist

## 2020-02-02 ENCOUNTER — Inpatient Hospital Stay (HOSPITAL_COMMUNITY)
Admission: RE | Admit: 2020-02-02 | Discharge: 2020-02-03 | DRG: 621 | Disposition: A | Discharge status: Home / Self Care | Payer: BC Managed Care – PPO | Attending: General Surgery | Admitting: General Surgery

## 2020-02-02 ENCOUNTER — Inpatient Hospital Stay (HOSPITAL_COMMUNITY): Payer: BC Managed Care – PPO | Admitting: Physician Assistant

## 2020-02-02 DIAGNOSIS — M1711 Unilateral primary osteoarthritis, right knee: Secondary | ICD-10-CM | POA: Diagnosis present

## 2020-02-02 DIAGNOSIS — Z808 Family history of malignant neoplasm of other organs or systems: Secondary | ICD-10-CM | POA: Diagnosis not present

## 2020-02-02 DIAGNOSIS — Z8261 Family history of arthritis: Secondary | ICD-10-CM | POA: Diagnosis not present

## 2020-02-02 DIAGNOSIS — Z8249 Family history of ischemic heart disease and other diseases of the circulatory system: Secondary | ICD-10-CM

## 2020-02-02 DIAGNOSIS — K449 Diaphragmatic hernia without obstruction or gangrene: Secondary | ICD-10-CM | POA: Diagnosis present

## 2020-02-02 DIAGNOSIS — K219 Gastro-esophageal reflux disease without esophagitis: Secondary | ICD-10-CM | POA: Diagnosis present

## 2020-02-02 DIAGNOSIS — Z9884 Bariatric surgery status: Secondary | ICD-10-CM

## 2020-02-02 DIAGNOSIS — I110 Hypertensive heart disease with heart failure: Secondary | ICD-10-CM | POA: Diagnosis present

## 2020-02-02 DIAGNOSIS — Z6841 Body Mass Index (BMI) 40.0 and over, adult: Secondary | ICD-10-CM

## 2020-02-02 DIAGNOSIS — K828 Other specified diseases of gallbladder: Secondary | ICD-10-CM | POA: Diagnosis present

## 2020-02-02 DIAGNOSIS — G43909 Migraine, unspecified, not intractable, without status migrainosus: Secondary | ICD-10-CM | POA: Diagnosis present

## 2020-02-02 DIAGNOSIS — R7303 Prediabetes: Secondary | ICD-10-CM | POA: Diagnosis present

## 2020-02-02 DIAGNOSIS — I509 Heart failure, unspecified: Secondary | ICD-10-CM | POA: Diagnosis present

## 2020-02-02 DIAGNOSIS — K760 Fatty (change of) liver, not elsewhere classified: Secondary | ICD-10-CM | POA: Diagnosis present

## 2020-02-02 DIAGNOSIS — K811 Chronic cholecystitis: Secondary | ICD-10-CM | POA: Diagnosis present

## 2020-02-02 HISTORY — PX: UPPER GI ENDOSCOPY: SHX6162

## 2020-02-02 LAB — TYPE AND SCREEN
ABO/RH(D): O POS
Antibody Screen: NEGATIVE

## 2020-02-02 LAB — HEMOGLOBIN AND HEMATOCRIT, BLOOD
HCT: 39.7 % (ref 36.0–46.0)
Hemoglobin: 12.8 g/dL (ref 12.0–15.0)

## 2020-02-02 LAB — ABO/RH: ABO/RH(D): O POS

## 2020-02-02 SURGERY — XI ROBOTIC GASTRIC SLEEVE RESECTION
Anesthesia: General | Site: Abdomen

## 2020-02-02 MED ORDER — DIPHENHYDRAMINE HCL 50 MG/ML IJ SOLN: 12.5000 mg | Freq: Three times a day (TID) | INTRAMUSCULAR | Status: DC | PRN

## 2020-02-02 MED ORDER — HEPARIN SODIUM (PORCINE) 5000 UNIT/ML IJ SOLN
5000.0000 [IU] | INTRAMUSCULAR | Status: AC
  Administered 2020-02-02: 06:00:00 5000 [IU] via SUBCUTANEOUS
  Filled 2020-02-02: qty 1

## 2020-02-02 MED ORDER — SODIUM CHLORIDE (PF) 0.9 % IJ SOLN
INTRAMUSCULAR | Status: AC
  Filled 2020-02-02: qty 50

## 2020-02-02 MED ORDER — SODIUM CHLORIDE 0.9 % IV SOLN
2.0000 g | INTRAVENOUS | Status: AC
  Administered 2020-02-02: 07:00:00 2 g via INTRAVENOUS
  Filled 2020-02-02: qty 2

## 2020-02-02 MED ORDER — KCL IN DEXTROSE-NACL 20-5-0.45 MEQ/L-%-% IV SOLN
INTRAVENOUS | Status: DC
  Filled 2020-02-02 (×3): qty 1000

## 2020-02-02 MED ORDER — ACETAMINOPHEN 500 MG PO TABS
1000.0000 mg | ORAL_TABLET | ORAL | Status: AC
  Administered 2020-02-02: 06:00:00 1000 mg via ORAL
  Filled 2020-02-02: qty 2

## 2020-02-02 MED ORDER — ENOXAPARIN SODIUM 30 MG/0.3ML ~~LOC~~ SOLN
30.0000 mg | Freq: Two times a day (BID) | SUBCUTANEOUS | Status: DC
  Administered 2020-02-02 – 2020-02-03 (×2): 30 mg via SUBCUTANEOUS
  Filled 2020-02-02 (×2): qty 0.3

## 2020-02-02 MED ORDER — CHLORHEXIDINE GLUCONATE 4 % EX LIQD: 60.0000 mL | Freq: Once | CUTANEOUS | Status: DC

## 2020-02-02 MED ORDER — PANTOPRAZOLE SODIUM 40 MG IV SOLR
40.0000 mg | Freq: Every day | INTRAVENOUS | Status: DC
  Administered 2020-02-02: 20:00:00 40 mg via INTRAVENOUS
  Filled 2020-02-02: qty 40

## 2020-02-02 MED ORDER — ONDANSETRON HCL 4 MG/2ML IJ SOLN
INTRAMUSCULAR | Status: AC
  Filled 2020-02-02: qty 2

## 2020-02-02 MED ORDER — SUCCINYLCHOLINE 20MG/ML (10ML) SYRINGE FOR MEDFUSION PUMP - OPTIME
INTRAMUSCULAR | Status: DC | PRN
  Administered 2020-02-02: 180 mg via INTRAVENOUS

## 2020-02-02 MED ORDER — ELETRIPTAN HYDROBROMIDE 40 MG PO TABS
40.0000 mg | ORAL_TABLET | Freq: Two times a day (BID) | ORAL | Status: DC | PRN
  Filled 2020-02-02: qty 1

## 2020-02-02 MED ORDER — SUGAMMADEX SODIUM 500 MG/5ML IV SOLN
INTRAVENOUS | Status: DC | PRN
  Administered 2020-02-02: 500 mg via INTRAVENOUS

## 2020-02-02 MED ORDER — PROMETHAZINE HCL 25 MG/ML IJ SOLN: 6.2500 mg | INTRAMUSCULAR | Status: DC | PRN

## 2020-02-02 MED ORDER — ENOXAPARIN (LOVENOX) PATIENT EDUCATION KIT
PACK | Freq: Once | Status: AC
  Filled 2020-02-02: qty 1

## 2020-02-02 MED ORDER — ONDANSETRON HCL 4 MG/2ML IJ SOLN
INTRAMUSCULAR | Status: DC | PRN
  Administered 2020-02-02: 4 mg via INTRAVENOUS

## 2020-02-02 MED ORDER — CARVEDILOL 6.25 MG PO TABS
6.2500 mg | ORAL_TABLET | Freq: Two times a day (BID) | ORAL | Status: DC
  Administered 2020-02-02 – 2020-02-03 (×2): 6.25 mg via ORAL
  Filled 2020-02-02 (×2): qty 1

## 2020-02-02 MED ORDER — ACETAMINOPHEN 500 MG PO TABS: 1000.0000 mg | ORAL_TABLET | Freq: Three times a day (TID) | ORAL | Status: DC

## 2020-02-02 MED ORDER — GABAPENTIN 100 MG PO CAPS
200.0000 mg | ORAL_CAPSULE | Freq: Two times a day (BID) | ORAL | Status: DC
  Administered 2020-02-02 – 2020-02-03 (×2): 200 mg via ORAL
  Filled 2020-02-02 (×2): qty 2

## 2020-02-02 MED ORDER — OXYCODONE HCL 5 MG/5ML PO SOLN
5.0000 mg | Freq: Four times a day (QID) | ORAL | Status: DC | PRN
  Administered 2020-02-02: 17:00:00 5 mg via ORAL
  Filled 2020-02-02: qty 5

## 2020-02-02 MED ORDER — MORPHINE SULFATE (PF) 2 MG/ML IV SOLN
1.0000 mg | INTRAVENOUS | Status: DC | PRN
  Administered 2020-02-02: 2 mg via INTRAVENOUS
  Filled 2020-02-02: qty 1

## 2020-02-02 MED ORDER — KETOROLAC TROMETHAMINE 30 MG/ML IJ SOLN: 30.0000 mg | Freq: Once | INTRAMUSCULAR | Status: DC | PRN

## 2020-02-02 MED ORDER — 0.9 % SODIUM CHLORIDE (POUR BTL) OPTIME
TOPICAL | Status: DC | PRN
  Administered 2020-02-02: 09:00:00 1000 mL

## 2020-02-02 MED ORDER — KETAMINE HCL 10 MG/ML IJ SOLN
INTRAMUSCULAR | Status: DC | PRN
  Administered 2020-02-02: 50 mg via INTRAVENOUS

## 2020-02-02 MED ORDER — KETAMINE HCL 10 MG/ML IJ SOLN
INTRAMUSCULAR | Status: AC
  Filled 2020-02-02: qty 1

## 2020-02-02 MED ORDER — INDOCYANINE GREEN 25 MG IV SOLR
INTRAVENOUS | Status: AC
  Filled 2020-02-02: qty 10

## 2020-02-02 MED ORDER — PROMETHAZINE HCL 25 MG/ML IJ SOLN: 12.5000 mg | Freq: Four times a day (QID) | INTRAMUSCULAR | Status: DC | PRN

## 2020-02-02 MED ORDER — LIDOCAINE HCL (PF) 2 % IJ SOLN
INTRAMUSCULAR | Status: DC | PRN
  Administered 2020-02-02: 1 mg/kg/h via INTRADERMAL

## 2020-02-02 MED ORDER — MIDAZOLAM HCL 5 MG/5ML IJ SOLN
INTRAMUSCULAR | Status: DC | PRN
  Administered 2020-02-02: 2 mg via INTRAVENOUS

## 2020-02-02 MED ORDER — FENTANYL CITRATE (PF) 100 MCG/2ML IJ SOLN
INTRAMUSCULAR | Status: AC
  Filled 2020-02-02: qty 2

## 2020-02-02 MED ORDER — INDOCYANINE GREEN 25 MG IV SOLR: 2.5000 mg | Freq: Once | INTRAVENOUS | Status: DC

## 2020-02-02 MED ORDER — ROCURONIUM BROMIDE 100 MG/10ML IV SOLN
INTRAVENOUS | Status: DC | PRN
  Administered 2020-02-02: 10 mg via INTRAVENOUS
  Administered 2020-02-02: 80 mg via INTRAVENOUS
  Administered 2020-02-02: 10 mg via INTRAVENOUS

## 2020-02-02 MED ORDER — CHLORHEXIDINE GLUCONATE 0.12 % MT SOLN
15.0000 mL | Freq: Once | OROMUCOSAL | Status: AC
  Administered 2020-02-02: 06:00:00 15 mL via OROMUCOSAL

## 2020-02-02 MED ORDER — LISINOPRIL 10 MG PO TABS
10.0000 mg | ORAL_TABLET | Freq: Every day | ORAL | Status: DC
  Administered 2020-02-03: 09:00:00 10 mg via ORAL
  Filled 2020-02-02: qty 1

## 2020-02-02 MED ORDER — SUGAMMADEX SODIUM 500 MG/5ML IV SOLN
INTRAVENOUS | Status: AC
  Filled 2020-02-02: qty 5

## 2020-02-02 MED ORDER — PHENYLEPHRINE 40 MCG/ML (10ML) SYRINGE FOR IV PUSH (FOR BLOOD PRESSURE SUPPORT)
PREFILLED_SYRINGE | INTRAVENOUS | Status: DC | PRN
  Administered 2020-02-02: 160 ug via INTRAVENOUS
  Administered 2020-02-02: 16 ug via INTRAVENOUS
  Administered 2020-02-02: 160 ug via INTRAVENOUS
  Administered 2020-02-02: 200 ug via INTRAVENOUS

## 2020-02-02 MED ORDER — MIDAZOLAM HCL 2 MG/2ML IJ SOLN
INTRAMUSCULAR | Status: AC
  Filled 2020-02-02: qty 2

## 2020-02-02 MED ORDER — SUCCINYLCHOLINE CHLORIDE 200 MG/10ML IV SOSY
PREFILLED_SYRINGE | INTRAVENOUS | Status: AC
  Filled 2020-02-02: qty 10

## 2020-02-02 MED ORDER — PROPOFOL 10 MG/ML IV BOLUS
INTRAVENOUS | Status: DC | PRN
  Administered 2020-02-02: 200 mg via INTRAVENOUS

## 2020-02-02 MED ORDER — ONDANSETRON HCL 4 MG/2ML IJ SOLN: 4.0000 mg | Freq: Four times a day (QID) | INTRAMUSCULAR | Status: DC | PRN

## 2020-02-02 MED ORDER — GABAPENTIN 300 MG PO CAPS
300.0000 mg | ORAL_CAPSULE | ORAL | Status: AC
  Administered 2020-02-02: 06:00:00 300 mg via ORAL
  Filled 2020-02-02: qty 1

## 2020-02-02 MED ORDER — ACETAMINOPHEN 160 MG/5ML PO SOLN
1000.0000 mg | Freq: Three times a day (TID) | ORAL | Status: DC
  Administered 2020-02-02 – 2020-02-03 (×3): 1000 mg via ORAL
  Filled 2020-02-02 (×3): qty 40.6

## 2020-02-02 MED ORDER — LACTATED RINGERS IV SOLN: INTRAVENOUS | Status: DC

## 2020-02-02 MED ORDER — DEXAMETHASONE SODIUM PHOSPHATE 4 MG/ML IJ SOLN
4.0000 mg | INTRAMUSCULAR | Status: AC
  Administered 2020-02-02: 08:00:00 8 mg via INTRAVENOUS

## 2020-02-02 MED ORDER — INDOCYANINE GREEN 25 MG IV SOLR
INTRAVENOUS | Status: DC | PRN
  Administered 2020-02-02: 2.5 mg via INTRAVENOUS

## 2020-02-02 MED ORDER — SCOPOLAMINE 1 MG/3DAYS TD PT72
1.0000 | MEDICATED_PATCH | TRANSDERMAL | Status: DC
  Administered 2020-02-02: 06:00:00 1.5 mg via TRANSDERMAL
  Filled 2020-02-02: qty 1

## 2020-02-02 MED ORDER — SODIUM CHLORIDE (PF) 0.9 % IJ SOLN
INTRAMUSCULAR | Status: DC | PRN
  Administered 2020-02-02: 50 mL

## 2020-02-02 MED ORDER — DEXMEDETOMIDINE (PRECEDEX) IN NS 20 MCG/5ML (4 MCG/ML) IV SYRINGE
PREFILLED_SYRINGE | INTRAVENOUS | Status: DC | PRN
  Administered 2020-02-02: 10 ug via INTRAVENOUS

## 2020-02-02 MED ORDER — DEXAMETHASONE SODIUM PHOSPHATE 10 MG/ML IJ SOLN
INTRAMUSCULAR | Status: AC
  Filled 2020-02-02: qty 1

## 2020-02-02 MED ORDER — APREPITANT 40 MG PO CAPS
40.0000 mg | ORAL_CAPSULE | ORAL | Status: AC
  Administered 2020-02-02: 06:00:00 40 mg via ORAL
  Filled 2020-02-02: qty 1

## 2020-02-02 MED ORDER — HYDROMORPHONE HCL 1 MG/ML IJ SOLN: 0.2500 mg | INTRAMUSCULAR | Status: DC | PRN

## 2020-02-02 MED ORDER — SIMETHICONE 80 MG PO CHEW: 80.0000 mg | CHEWABLE_TABLET | Freq: Four times a day (QID) | ORAL | Status: DC | PRN

## 2020-02-02 MED ORDER — MECLIZINE HCL 25 MG PO TABS
25.0000 mg | ORAL_TABLET | Freq: Four times a day (QID) | ORAL | Status: DC | PRN
Start: 2020-02-02 — End: 2020-02-03

## 2020-02-02 MED ORDER — PROPOFOL 10 MG/ML IV BOLUS
INTRAVENOUS | Status: AC
  Filled 2020-02-02: qty 20

## 2020-02-02 MED ORDER — FENTANYL CITRATE (PF) 100 MCG/2ML IJ SOLN
INTRAMUSCULAR | Status: DC | PRN
  Administered 2020-02-02: 25 ug via INTRAVENOUS
  Administered 2020-02-02: 100 ug via INTRAVENOUS

## 2020-02-02 MED ORDER — BUPIVACAINE LIPOSOME 1.3 % IJ SUSP
INTRAMUSCULAR | Status: DC | PRN
  Administered 2020-02-02: 20 mL

## 2020-02-02 MED ORDER — PHENYLEPHRINE HCL-NACL 10-0.9 MG/250ML-% IV SOLN
INTRAVENOUS | Status: DC | PRN
  Administered 2020-02-02: 60 ug/min via INTRAVENOUS

## 2020-02-02 MED ORDER — LACTATED RINGERS IR SOLN
Status: DC | PRN
  Administered 2020-02-02: 1000 mL

## 2020-02-02 MED ORDER — METHOCARBAMOL 1000 MG/10ML IJ SOLN
500.0000 mg | Freq: Three times a day (TID) | INTRAVENOUS | Status: DC | PRN
  Filled 2020-02-02: qty 5

## 2020-02-02 MED ORDER — MEPERIDINE HCL 50 MG/ML IJ SOLN: 6.2500 mg | INTRAMUSCULAR | Status: DC | PRN

## 2020-02-02 MED ORDER — ROCURONIUM BROMIDE 10 MG/ML (PF) SYRINGE
PREFILLED_SYRINGE | INTRAVENOUS | Status: AC
  Filled 2020-02-02: qty 10

## 2020-02-02 MED ORDER — ENSURE MAX PROTEIN PO LIQD
2.0000 [oz_av] | ORAL | Status: DC
  Administered 2020-02-03 (×3): 2 [oz_av] via ORAL

## 2020-02-02 MED ORDER — ORAL CARE MOUTH RINSE: 15.0000 mL | Freq: Once | OROMUCOSAL | Status: AC

## 2020-02-02 SURGICAL SUPPLY — 79 items
ADH SKN CLS APL DERMABOND .7 (GAUZE/BANDAGES/DRESSINGS) ×2
APL PRP STRL LF DISP 70% ISPRP (MISCELLANEOUS) ×4
APPLIER CLIP 5 13 M/L LIGAMAX5 (MISCELLANEOUS) ×4
APPLIER CLIP ROT 10 11.4 M/L (STAPLE)
APR CLP MED LRG 11.4X10 (STAPLE)
APR CLP MED LRG 5 ANG JAW (MISCELLANEOUS) ×2
BLADE SURG SZ11 CARB STEEL (BLADE) ×4 IMPLANT
CANNULA REDUC XI 12-8 STAPL (CANNULA) ×3
CANNULA REDUC XI 12-8MM STAPL (CANNULA) ×1
CANNULA REDUCER 12-8 DVNC XI (CANNULA) ×2 IMPLANT
CHLORAPREP W/TINT 26 (MISCELLANEOUS) ×8 IMPLANT
CLIP APPLIE 5 13 M/L LIGAMAX5 (MISCELLANEOUS) IMPLANT
CLIP APPLIE ROT 10 11.4 M/L (STAPLE) IMPLANT
COVER MAYO STAND STRL (DRAPES) ×4 IMPLANT
COVER SURGICAL LIGHT HANDLE (MISCELLANEOUS) ×4 IMPLANT
COVER TIP SHEARS 8 DVNC (MISCELLANEOUS) IMPLANT
COVER TIP SHEARS 8MM DA VINCI (MISCELLANEOUS)
COVER WAND RF STERILE (DRAPES) ×4 IMPLANT
DECANTER SPIKE VIAL GLASS SM (MISCELLANEOUS) ×4 IMPLANT
DERMABOND ADVANCED (GAUZE/BANDAGES/DRESSINGS) ×2
DERMABOND ADVANCED .7 DNX12 (GAUZE/BANDAGES/DRESSINGS) ×2 IMPLANT
DRAPE 3/4 80X56 (DRAPES) ×4 IMPLANT
DRAPE ARM DVNC X/XI (DISPOSABLE) ×6 IMPLANT
DRAPE COLUMN DVNC XI (DISPOSABLE) IMPLANT
DRAPE DA VINCI XI ARM (DISPOSABLE) ×16
DRAPE DA VINCI XI COLUMN (DISPOSABLE) ×4
DRSG TEGADERM 2-3/8X2-3/4 SM (GAUZE/BANDAGES/DRESSINGS) ×20 IMPLANT
ELECT REM PT RETURN 15FT ADLT (MISCELLANEOUS) ×4 IMPLANT
ENDOLOOP SUT PDS II  0 18 (SUTURE)
ENDOLOOP SUT PDS II 0 18 (SUTURE) IMPLANT
GAUZE 4X4 16PLY RFD (DISPOSABLE) ×4 IMPLANT
GAUZE SPONGE 2X2 8PLY STRL LF (GAUZE/BANDAGES/DRESSINGS) ×2 IMPLANT
GLOVE BIO SURGEON STRL SZ7.5 (GLOVE) ×8 IMPLANT
GLOVE INDICATOR 8.0 STRL GRN (GLOVE) ×8 IMPLANT
GOWN STRL REUS W/TWL LRG LVL3 (GOWN DISPOSABLE) ×12 IMPLANT
GOWN STRL REUS W/TWL XL LVL3 (GOWN DISPOSABLE) ×8 IMPLANT
GRASPER SUT TROCAR 14GX15 (MISCELLANEOUS) ×4 IMPLANT
KIT BASIN OR (CUSTOM PROCEDURE TRAY) ×4 IMPLANT
MARKER SKIN DUAL TIP RULER LAB (MISCELLANEOUS) ×4 IMPLANT
MAT PREVALON FULL STRYKER (MISCELLANEOUS) ×4 IMPLANT
NDL INSUFFLATION 14GA 120MM (NEEDLE) IMPLANT
NDL SPNL 22GX3.5 QUINCKE BK (NEEDLE) ×2 IMPLANT
NEEDLE INSUFFLATION 14GA 120MM (NEEDLE) IMPLANT
NEEDLE SPNL 22GX3.5 QUINCKE BK (NEEDLE) ×4 IMPLANT
PACK CARDIOVASCULAR III (CUSTOM PROCEDURE TRAY) ×4 IMPLANT
RELOAD STAPLE 60 3.5 BLU DVNC (STAPLE) ×4 IMPLANT
RELOAD STAPLE 60 4.3 GRN DVNC (STAPLE) ×2 IMPLANT
RELOAD STAPLER 3.5X60 BLU DVNC (STAPLE) ×12 IMPLANT
RELOAD STAPLER 4.3X60 GRN DVNC (STAPLE) ×2 IMPLANT
SCISSORS LAP 5X35 DISP (ENDOMECHANICALS) IMPLANT
SEAL CANN UNIV 5-8 DVNC XI (MISCELLANEOUS) ×4 IMPLANT
SEAL XI 5MM-8MM UNIVERSAL (MISCELLANEOUS) ×16
SEALER VESSEL DA VINCI XI (MISCELLANEOUS) ×4
SEALER VESSEL EXT DVNC XI (MISCELLANEOUS) ×2 IMPLANT
SET IRRIG TUBING LAPAROSCOPIC (IRRIGATION / IRRIGATOR) ×4 IMPLANT
SLEEVE GASTRECTOMY 40FR VISIGI (MISCELLANEOUS) ×4 IMPLANT
SOL ANTI FOG 6CC (MISCELLANEOUS) ×2 IMPLANT
SOLUTION ANTI FOG 6CC (MISCELLANEOUS) ×2
SOLUTION ELECTROLUBE (MISCELLANEOUS) ×4 IMPLANT
SPONGE GAUZE 2X2 STER 10/PKG (GAUZE/BANDAGES/DRESSINGS) ×2
STAPLER 60 DA VINCI SURE FORM (STAPLE) ×4
STAPLER 60 SUREFORM DVNC (STAPLE) ×2 IMPLANT
STAPLER CANNULA SEAL DVNC XI (STAPLE) ×2 IMPLANT
STAPLER CANNULA SEAL XI (STAPLE) ×4
STAPLER RELOAD 3.5X60 BLU DVNC (STAPLE) ×12
STAPLER RELOAD 3.5X60 BLUE (STAPLE) ×24
STAPLER RELOAD 4.3X60 GREEN (STAPLE) ×4
STAPLER RELOAD 4.3X60 GRN DVNC (STAPLE) ×2
SUT MNCRL AB 4-0 PS2 18 (SUTURE) ×4 IMPLANT
SUT VICRYL 0 TIES 12 18 (SUTURE) ×4 IMPLANT
SUT VLOC 180 2-0 9IN GS21 (SUTURE) IMPLANT
SYR 20ML LL LF (SYRINGE) ×4 IMPLANT
TAPE STRIPS DRAPE STRL (GAUZE/BANDAGES/DRESSINGS) ×4 IMPLANT
TOWEL OR 17X26 10 PK STRL BLUE (TOWEL DISPOSABLE) ×4 IMPLANT
TOWEL OR NON WOVEN STRL DISP B (DISPOSABLE) ×4 IMPLANT
TRAY FOLEY MTR SLVR 16FR STAT (SET/KITS/TRAYS/PACK) IMPLANT
TROCAR ADV FIXATION 5X100MM (TROCAR) IMPLANT
TROCAR BLADELESS OPT 5 100 (ENDOMECHANICALS) ×4 IMPLANT
TUBING INSUFFLATION 10FT LAP (TUBING) ×4 IMPLANT

## 2020-02-02 NOTE — Progress Notes (Signed)
Discussed post op day goals with patient including ambulation, IS, diet progression, pain, and nausea control.  BSTOP education provided including BSTOP information guide, "Guide for Pain Management after your Bariatric Procedure".  Questions answered. 

## 2020-02-02 NOTE — Op Note (Signed)
02/02/2020 Dalbert Mayotte Malphrus Feb 21, 1960 [id number]   PRE-OPERATIVE DIAGNOSIS:     Severe obesity BMI 45   Unilateral primary osteoarthritis, right knee   Migraines   Fatty liver disease, nonalcoholic Biliary dyskinesea HTN CHF    POST-OPERATIVE DIAGNOSIS:  Same + probably chronic cholecystitis  PROCEDURE:  Procedure(s): Xi robotic SLEEVE GASTRECTOMY  UPPER GI ENDOSCOPY Xi robotic cholecystectomy with ICG immunofluorescence  SURGEON:  Surgeon(s): Atilano Ina, MD FACS FASMBS  ASSISTANTS: Phylliss Blakes MD FACS  ANESTHESIA:   general  DRAINS: none   BOUGIE: 40 fr ViSiGi  LOCAL MEDICATIONS USED:   Exparel  EBL: 25 cc  SPECIMEN:  Source of Specimen:  Greater curvature of stomach; gallbladder  DISPOSITION OF SPECIMEN:  PATHOLOGY  COUNTS:  YES  INDICATION FOR PROCEDURE: This is a very pleasant 59 y.o.-year-old morbidly obese female who has had unsuccessful attempts for sustained weight loss.  Patient also suffered from intermittent right upper quadrant pain she had an ultrasound and nuclear imaging of her gallbladder she was found to have biliary dyskinesia.  The patient presents today for a planned laparoscopic sleeve gastrectomy with upper endoscopy & cholecystectomy. We have discussed the risk and benefits of the procedure extensively preoperatively. Please see my separate notes.  PROCEDURE: After obtaining informed consent and receiving 5000 units of subcutaneous heparin, the patient was brought to the operating room at [facility] and placed supine on the operating room table. General endotracheal anesthesia was established. Sequential compression devices were placed. A orogastric tube was placed. The patient's abdomen was prepped and draped in the usual standard surgical fashion. The patient received preoperative IV antibiotic. A surgical timeout was performed. ERAS protocol used.   Access to the abdomen was achieved using a 5 mm 0 laparoscope thru a 5 mm trocar  In the mid clavicular line in the left midabdomen about 3 cm to the left & slight above the level of the umbilicus using the Optiview technique. Pneumoperitoneum was smoothly established up to 15 mm of mercury. The laparoscope was advanced and the abdominal cavity was surveilled. The patient was then placed in reverse Trendelenburg.  A tap block was performed on patient's right side under direct visualization.  A 12 mm robotic trocar was placed in the mid right abdomen.  The patient was rotated to the right.  The optical entry trocar in the left midabdomen was changed to a 8 mm robotic trocar under direct visualization.  The [facility] liver retractor was placed under the left lobe of the liver through a 5 mm trocar incision site in the subxiphoid position. A final 5 mm trocar was placed in the lateral LUQ.  A tap block was performed along the patient's left side also under direct laparoscopic visualization.     The XI robot was then docked and targeted for upper abdominal anatomy.  Arm 3  was attached to the left mid abdominal trocar and the camera was inserted.  Anatomy was targeted.  Arms 2 and 4 were then attached to the robotic trochars.  A fenestrated bipolar was placed in arm 2.  A vessel sealer was placed in arm 4.  My assistant stayed at the bedside while I went to the robotic console.  The stomach was inspected. It was completely decompressed and the orogastric tube was removed.  There was no anterior dimple that was obviously visible.  However her the operative upper GI suggested a small sliding hiatal hernia.  The calibration tube was placed in the oropharynx and guided down  into the stomach by the CRNA. 10 mL of air was insufflated into the calibration balloon. The calibration tubing was then gently pulled back by the CRNA and it did not slide past the GE junction. At this point the calibration tubing was desufflated and pulled back into the esophagus. This confirmed my suspicion of no clinically  significant hiatal hernia.  At this point the calibration tubing was deflated and removed from the patient's body.    We identified the pylorus and measured 6 cm proximal to the pylorus and identified an area of where we would start taking down the short gastric vessels.  The vessel sealer was used to take down the short gastric vessels along the greater curvature of the stomach. We were able to enter the lesser sac. We continued to march along the greater curvature of the stomach taking down the short gastrics.  My assistant provided traction laterally to patient's left side.  as we approached the gastrosplenic ligament we took care in this area not to injure the spleen. We were able to take down the entire gastrosplenic ligament. We then mobilized the fundus away from the left crus of diaphragm. There were not any significant posterior gastric avascular attachments.  There was some redundant fundus up at the top but we were able to clear it off of the left crus.  This left the stomach completely mobilized. No vessels had been taken down along the lesser curvature of the stomach.   We then reidentified the pylorus. A 40Fr ViSiGi was then placed in the oropharynx and advanced down into the stomach and placed in the distal antrum and positioned along the lesser curvature. It was placed under suction which secured the 40Fr ViSiGi in place along the lesser curve. Then using the robotic 60 mm stapler with a green load, I placed a stapler along the antrum approximately 6cm from the pylorus. The stapler was angled so that there is ample room at the angularis incisura. I then fired the first staple load after inspecting it posteriorly to ensure adequate space both anteriorly and posteriorly.  The stapler did not have to pause for compression so at this point I started using 60 mm blue load staple cartridges. The robotic stapler was then repositioned with a 60 mm blue load  and we continued to march up along the  Theodosia. My assistant was holding traction along the greater curvature stomach along the cauterized short gastric vessels ensuring that the stomach was symmetrically retracted. Prior to each firing of the staple, we rotated the stomach to ensure that there is adequate stomach left.  As we approached the fundus, I used 60 mm blue cartridge aiming  lateral to the GE junction after mobilizing some of the esophageal fat pad.  The sleeve was inspected. There is no evidence of cork screw. The staple line appeared hemostatic except along the first portion of the staple line at the antrum.  The assistant placed 4 hemoclips at this point there was excellent hemostasis. The CRNA inflated the ViSiGi to the green zone and the upper abdomen was flooded with saline. There were no bubbles. The sleeve was decompressed and the ViSiGi removed.   At this time we then proceeded with the cholecystectomy portion of the procedure.  Patient was rotated to her left.  An additional 8 mm robotic trocar was placed in the right lateral abdominal wall under direct visualization.  A prograsp was placed in arm 3 and used to retract the gallbladder up toward the  patient's right shoulder.  The patient had a fatty liver.  The fenestrated bipolar was placed in arm 4 and a robotic hook electrocautery was placed in arm 1.  There was omentum adhered to the body and infundibulum of the gallbladder.  Using the robotic hook with cautery I mobilized the omentum off of the body of the gallbladder.  The infundibulum was then grasped and retracted laterally.  The peritoneum overlying the triangle of Coelho was incised with hook electrocautery.  I visualized the cystic duct as well as the cystic artery.  We were able to circumferentially dissected out and around both the cystic duct and cystic artery With the aid of the hook.  A large critical view was obtained.    Immunofluorescence was used to visualize the cystic duct entering the infundibulum.  There were  no other structures entering the gallbladder other than the cystic artery and the cystic duct.  My assistant was able to place 3 hemoclips on the proximal aspect of the cystic duct and one as it entered the gallbladder.  She also was able to place 2 hemoclips on the proximal aspect of the cystic artery.  Using EndoShears she was able to transect the cystic duct between the clips.  I then used robotic hook cautery to transect the artery where it entered the gallbladder.  I then mobilized the gallbladder off of the liver using robotic hook electrocautery.  The gallbladder was completely freed.  Irrigation was performed.  There was no evidence of bleeding or bile leak.  I reinspected the gallbladder fossa again and there is no evidence of bleeding or bile leak.  We inspected the sleeve staple line.  There is no evidence of bleeding.  The surgical robot was undocked.  I scrubbed back in.  the gallbladder was placed in a specimen bag and brought out through the 12 mm trocar.  The sleeve specimen was then extracted with the aid of a Prestige grasper.  This was done under visual position with the robotic camera.  I then closed the 12 mm trocar site with 1 interrupted 0 Vicryl sutures through the fascia using the PMI suture passer. The closure was viewed laparoscopically and it was airtight. Remaining Exparel was then infiltrated in the preperitoneal spaces around the trocar sites.   Dr. Kae Heller then performed an upper endoscopy.  Please see her operative note for details regarding the upper endoscopy The sleeve was decompressed and the endoscope was removed.    The liver retractor was removed.Pneumoperitoneum was released.  There was some bleeding from the soft tissue at the specimen extraction site so Bovie electrocautery was used for hemostasis.  All trocar sites were closed with a 4-0 Monocryl in a subcuticular fashion followed by the application of Steri-Strips, gauze and Tegaderms. The patient was extubated and  taken to the recovery room in stable condition. All needle, instrument, and sponge counts were correct x2. There are no immediate complications  (1) 60 mm green  (6) 60 mm blue   PLAN OF CARE: Admit to inpatient   PATIENT DISPOSITION:  PACU - hemodynamically stable.   Delay start of Pharmacological VTE agent (>24hrs) due to surgical blood loss or risk of bleeding:  no  Leighton Ruff. Redmond Pulling, MD, FACS FASMBS General, Bariatric, & Minimally Invasive Surgery [facility], [place]

## 2020-02-02 NOTE — Anesthesia Postprocedure Evaluation (Signed)
Anesthesia Post Note  Patient: Savannah [name]  Procedure(s) Performed: XI ROBOTIC GASTRIC SLEEVE RESECTION, XI ROBOTIC CHOLECYSTECTOMY (N/A Abdomen) UPPER GI ENDOSCOPY (N/A )     Patient location during evaluation: PACU Anesthesia Type: General Level of consciousness: awake Pain management: pain level controlled Vital Signs Assessment: post-procedure vital signs reviewed and stable Respiratory status: spontaneous breathing Cardiovascular status: stable Postop Assessment: no apparent nausea or vomiting Anesthetic complications: no   No complications documented.  Last Vitals:  Vitals:   02/02/20 1449 02/02/20 1734  BP: (!) 161/94 (!) 151/86  Pulse: 64 60  Resp: 17 17  Temp: 36.8 C 36.6 C  SpO2: 99% 99%    Last Pain:  Vitals:   02/02/20 1734  TempSrc: Oral  PainSc:                  Huston Foley

## 2020-02-02 NOTE — Transfer of Care (Signed)
Immediate Anesthesia Transfer of Care Note  Patient: Michelle Wnek Labrecque  Procedure(s) Performed: XI ROBOTIC GASTRIC SLEEVE RESECTION, XI ROBOTIC CHOLECYSTECTOMY (N/A Abdomen) UPPER GI ENDOSCOPY (N/A )  Patient Location: PACU  Anesthesia Type:General  Level of Consciousness: awake, alert  and oriented  Airway & Oxygen Therapy: Patient Spontanous Breathing and Patient connected to face mask  Post-op Assessment: Report given to RN and Post -op Vital signs reviewed and stable  Post vital signs: Reviewed and stable  Last Vitals:  Vitals Value Taken Time  BP 110/63 02/02/20 1012  Temp    Pulse 65 02/02/20 1013  Resp 21 02/02/20 1013  SpO2 95 % 02/02/20 1013  Vitals shown include unvalidated device data.  Last Pain:  Vitals:   02/02/20 0618  TempSrc: Oral      Patients Stated Pain Goal: 3 (51/10/21 1173)  Complications: No complications documented.

## 2020-02-02 NOTE — Interval H&P Note (Signed)
History and Physical Interval Note:  02/02/2020 7:11 AM  Savannah [name]  has presented today for surgery, with the diagnosis of MORBID OBESITY.  The various methods of treatment have been discussed with the patient and family. After consideration of risks, benefits and other options for treatment, the patient has consented to  Procedure(s): XI ROBOTIC GASTRIC SLEEVE RESECTION (N/A) UPPER GI ENDOSCOPY (N/A) as a surgical intervention.  The patient's history has been reviewed, patient examined, no change in status, stable for surgery.  I have reviewed the patient's chart and labs.  Questions were answered to the patient's satisfaction.    Along with robotic cholecystectomy, possible hiatal hernia repair  Discussed use of surgical robot  All questions asked and questioned  [name]

## 2020-02-02 NOTE — Progress Notes (Signed)
PHARMACY CONSULT FOR:  Risk Assessment for Post-Discharge VTE Following Bariatric Surgery  Post-Discharge VTE Risk Assessment: This patient's probability of 30-day post-discharge VTE is increased due to the factors marked:   Female  X  Age >/=60 years    BMI >/=50 kg/m2  X  CHF    Dyspnea at Rest    Paraplegia   X Non-gastric-band surgery    Operation Time >/=3 hr    Return to OR     Length of Stay >/= 3 d   Hx of VTE   Hypercoagulable condition   Significant venous stasis    Predicted probability of 30-day post-discharge VTE: 2.00%, Severe  Recommendation for Discharge: Enoxaparin 40 mg [place] q12h x 4 weeks post-discharge   Savannah [name] is a 59 y.o. female who underwent laparoscopic sleeve gastrectomy with upper endoscopy & cholecystectomy on 02/02/20.  Case start: 08:08 Case end: 09:55   Allergies  Allergen Reactions  . Dust Mite Extract Other (See Comments)    Environmental allergies    Patient Measurements: Height: 5\' 5"  (165.1 cm) Weight: 123.7 kg (272 lb 9.6 oz) IBW/kg (Calculated) : 57 Body mass index is 45.36 kg/m.  Recent Labs    02/02/20 1105  HGB 12.8  HCT 39.7   Estimated Creatinine Clearance: 85 mL/min (by C-G formula based on SCr of 0.93 mg/dL).    Past Medical History:  Diagnosis Date  . Arthritis    bilateral knees, recent LCL sprain-on left knee  . CHF (congestive heart failure) ([place])   . Dysrhythmia    trigem  . Environmental allergies   . Family history of adverse reaction to anesthesia    slow to wake up  . Fatty liver   . Headache(784.0)    migraines-on meds for control, relpax, ketoprophen, vicoden, muscle relaxer  . Hearing loss 2016   High frequency hearing - Bilateral   . History of hiatal hernia   . Hot flashes   . Joint pain   . Lichen sclerosus   . Lipoma    Right Knee  . Menorrhagia   . Migraines   . Necrobiosis lipoidica   . Osteoarthritis   . Overweight   . Pelvic pain in female   . PONV (postoperative  nausea and vomiting)    ponv, slow to awake  . Seasonal allergies   . Sinus problem   . Sprain 10/25/2010   left knee  . Trigeminal pulse   . Trigger finger, right    Middle finger and right wrist   . Vitamin D deficiency      Medications Prior to Admission  Medication Sig Dispense Refill Last Dose  . Calcium Carbonate-Vit D-Min (CALTRATE 600+D PLUS MINERALS) 600-800 MG-UNIT CHEW Chew 1 tablet by mouth in the morning, at noon, and at bedtime. (mid morning, at lunch, & in the afternoon)   Past Week at Unknown time  . carvedilol (COREG) 6.25 MG tablet Take 1 tablet (6.25 mg total) by mouth 2 (two) times daily with a meal. 180 tablet 3 02/02/2020 at Unknown time  . Cholecalciferol (VITAMIN D3) 50 MCG (2000 UT) TABS Take 2,000 Units by mouth every morning.   Past Week at Unknown time  . clobetasol cream (TEMOVATE) 0.05 % Apply topically 2 (two) times daily. (Patient taking differently: Apply 1 application topically 2 (two) times daily as needed (lichen sclerosus).) 60 g 1 Past Week at Unknown time  . Collagen-Boron-Hyaluronic Acid (MOVE FREE ULTRA JOINT HEALTH) 40-5-3.3 MG TABS Take 1 tablet by mouth daily.  Past Week at Unknown time  . cyclobenzaprine (FLEXERIL) 10 MG tablet Take 1 tablet (10 mg total) by mouth 3 (three) times daily as needed for muscle spasms. 30 tablet 1 Past Month at Unknown time  . eletriptan (RELPAX) 40 MG tablet Take 1 tablet (40 mg total) by mouth 2 (two) times daily as needed for migraine or headache. 10 tablet 5 Past Month at Unknown time  . furosemide (LASIX) 20 MG tablet Take 0.5 tablets (10 mg total) by mouth daily as needed for edema. 45 tablet 3 Past Month at Unknown time  . meclizine (ANTIVERT) 25 MG tablet Take 25 mg by mouth 4 (four) times daily as needed (dizziness/nausea/vertigo).    Past Month at Unknown time  . mometasone (ELOCON) 0.1 % ointment Apply topically daily. (Patient taking differently: Apply 1 application topically daily as needed (lichen  sclerosus).) 45 g 1 Past Month at Unknown time  . Multiple Vitamins-Minerals (BARIATRIC MULTIVITAMINS/IRON PO) Take 1 tablet by mouth daily. BariMelts Multivitamin W/Iron     . Multiple Vitamins-Minerals (OCUVITE ADULT 50+ PO) Take 1 tablet by mouth daily with lunch.   Past Month at Unknown time  . naproxen (NAPROSYN) 500 MG tablet Take 1 tablet (500 mg total) by mouth 2 (two) times daily with a meal. (Patient taking differently: Take 500 mg by mouth 2 (two) times daily as needed (pain.).) 30 tablet 3 Past Month at Unknown time  . hyoscyamine (LEVSIN) 0.125 MG tablet Take 1 tablet (0.125 mg total) by mouth every 4 (four) hours as needed. (Patient taking differently: Take 0.125 mg by mouth every 4 (four) hours as needed (abdominal cramps/spasms.).) 30 tablet 1 More than a month at Unknown time  . promethazine (PHENERGAN) 25 MG tablet Take 1 tablet (25 mg total) by mouth every 6 (six) hours as needed for nausea or vomiting. 30 tablet 0 Unknown at Unknown time  . venlafaxine XR (EFFEXOR XR) 37.5 MG 24 hr capsule Take 1 tablet daily for 1 week, then 1 tablet every other day x 1 week, then take 1 tablet every 3rd day for 1 week, then stop (Patient not taking: Reported on 01/20/2020) 30 capsule 0 Not Taking at Unknown time    Gretta Arab PharmD, BCPS Clinical Pharmacist [facility] main pharmacy [phone] 02/02/2020 11:56 AM

## 2020-02-02 NOTE — TOC Benefit Eligibility Note (Signed)
Transition of Care [facility]) Benefit Eligibility Note    Patient Details  Name: Savannah [name] MRN: [id number] Date of Birth: 15-Oct-1960   Medication/Dose: Enoxaparin 40 mg [place] q 12h x 4 weeks  Covered?: Yes  Tier: 3 Drug  Prescription Coverage Preferred Pharmacy: local  Spoke with Person/Company/Phone Number:: [employer] [phone]  Co-Pay: $5.00  Prior Approval: No  Deductible: Met       Savannah [name] Phone Number: 02/02/2020, 1:35 PM

## 2020-02-02 NOTE — Op Note (Addendum)
Preoperative diagnosis: robotic sleeve gastrectomy  Postoperative diagnosis: Same   Procedure: Upper endoscopy   Surgeon: Clovis Riley, M.D.  Anesthesia: Gen.   Description of procedure: The endoscope was placed in the mouth and oropharynx and under endoscopic vision it was advanced to the esophagogastric junction which was identified at 40cm from the teeth.  The pouch was tensely insufflated while the upper abdomen was flooded with irrigation to perform a leak test, which was negative. No bubbles were seen.  The staple line was hemostatic and the lumen was evenly tubular without undue narrowing, angulation or twisting specifically at the incisura angularis. There was some bilious fluid in the antrum and proximal stomach which was evacuated. The lumen was decompressed and the scope was withdrawn without difficulty.    Clovis Riley, M.D. General, Bariatric, & Minimally Invasive Surgery [facility] [place]

## 2020-02-02 NOTE — Anesthesia Procedure Notes (Signed)
Procedure Name: Intubation Performed by: Rosaland Lao, CRNA Pre-anesthesia Checklist: Patient identified, Emergency Drugs available, Suction available and Patient being monitored Patient Re-evaluated:Patient Re-evaluated prior to induction Oxygen Delivery Method: Circle system utilized Preoxygenation: Pre-oxygenation with 100% oxygen Induction Type: IV induction Ventilation: Mask ventilation without difficulty and Oral airway inserted - appropriate to patient size Laryngoscope Size: 3 and Glidescope Grade View: Grade I Tube type: Oral Number of attempts: 1 Airway Equipment and Method: Stylet and Oral airway Placement Confirmation: ETT inserted through vocal cords under direct vision,  positive ETCO2 and breath sounds checked- equal and bilateral Secured at: 22 cm Tube secured with: Tape Dental Injury: Teeth and Oropharynx as per pre-operative assessment  Comments: DL with MAC 4, after quick look mask ventilated and used a glidescope with 3 blade.

## 2020-02-02 NOTE — Discharge Instructions (Signed)
° ° ° °GASTRIC BYPASS/SLEEVE ° Home Care Instructions ° ° These instructions are to help you care for yourself when you go home. ° °Call: If you have any problems. °• Call [phone] and ask for the surgeon on call °• If you need immediate help, come to the ER at [facility].  °• Tell the ER staff that you are a new post-op gastric bypass or gastric sleeve patient °  °Signs and symptoms to report: • Severe vomiting or nausea °o If you cannot keep down clear liquids for longer than 1 day, call your surgeon  °• Abdominal pain that does not get better after taking your pain medication °• Fever over 100.4° F with chills °• Heart beating over 100 beats a minute °• Shortness of breath at rest °• Chest pain °•  Redness, swelling, drainage, or foul odor at incision (surgical) sites °•  If your incisions open or pull apart °• Swelling or pain in calf (lower leg) °• Diarrhea (Loose bowel movements that happen often), frequent watery, uncontrolled bowel movements °• Constipation, (no bowel movements for 3 days) if this happens: Pick one °o Milk of Magnesia, 2 tablespoons by mouth, 3 times a day for 2 days if needed °o Stop taking Milk of Magnesia once you have a bowel movement °o Call your doctor if constipation continues °Or °o Miralax  (instead of Milk of Magnesia) following the label instructions °o Stop taking Miralax once you have a bowel movement °o Call your doctor if constipation continues °• Anything you think is not normal °  °Normal side effects after surgery: • Unable to sleep at night or unable to focus °• Irritability or moody °• Being tearful (crying) or depressed °These are common complaints, possibly related to your anesthesia medications that put you to sleep, stress of surgery, and change in lifestyle.  This usually goes away a few weeks after surgery.  If these feelings continue, call your primary care doctor. °  °Wound Care: You may have surgical glue, steri-strips, or staples over your incisions after  surgery °• Surgical glue:  Looks like a clear film over your incisions and will wear off a little at a time °• Steri-strips: Strips of tape over your incisions. You may notice a yellowish color on the skin under the steri-strips. This is used to make the   steri-strips stick better. Do not pull the steri-strips off - let them fall off °• Staples: Staples may be removed before you leave the hospital °o If you go home with staples, call [facility], [phone] at for an appointment with your surgeon’s nurse to have staples removed 10 days after surgery. °• Showering: You may shower two (2) days after your surgery unless your surgeon tells you differently °o Wash gently around incisions with warm soapy water, rinse well, and gently pat dry  °o No tub baths until staples are removed, steri-strips fall off or glue is gone.  °  °Medications: • Medications should be liquid or crushed if larger than the size of a dime °• Extended release pills (medication that release a little bit at a time through the day) should NOT be crushed or cut. (examples include XL, ER, DR, SR) °• Depending on the size and number of medications you take, you may need to space (take a few throughout the day)/change the time you take your medications so that you do not over-fill your pouch (smaller stomach) °• Make sure you follow-up with your primary care doctor to   make medication changes needed during rapid weight loss and life-style changes °• If you have diabetes, follow up with the doctor that orders your diabetes medication(s) within one week after surgery and check your blood sugar regularly. °• Do not drive while taking prescription pain medication  °• It is ok to take Tylenol by the bottle instructions with your pain medicine or instead of your pain medicine as needed.  DO NOT TAKE NSAIDS (EXAMPLES OF NSAIDS:  IBUPROFREN/ NAPROXEN)  °Diet:                    First 2 Weeks ° You will see the dietician t about two (2) weeks  after your surgery. The dietician will increase the types of foods you can eat if you are handling liquids well: °• If you have severe vomiting or nausea and cannot keep down clear liquids lasting longer than 1 day, call your surgeon @ ([phone]) °Protein Shake °• Drink at least 2 ounces of shake 5-6 times per day °• Each serving of protein shakes (usually 8 - 12 ounces) should have: °o 15 grams of protein  °o And no more than 5 grams of carbohydrate  °• Goal for protein each day: °o Men = 80 grams per day °o Women = 60 grams per day °• Protein powder may be added to fluids such as non-fat milk or Lactaid milk or unsweetened Soy/Almond milk (limit to 35 grams added protein powder per serving) ° °Hydration °• Slowly increase the amount of water and other clear liquids as tolerated (See Acceptable Fluids) °• Slowly increase the amount of protein shake as tolerated  °•  Sip fluids slowly and throughout the day.  Do not use straws. °• May use sugar substitutes in small amounts (no more than 6 - 8 packets per day; i.e. Splenda) ° °Fluid Goal °• The first goal is to drink at least 8 ounces of protein shake/drink per day (or as directed by the nutritionist); some examples of protein shakes are Syntrax Nectar, Adkins Advantage, EAS Edge HP, and Unjury. See handout from pre-op Bariatric Education Class: °o Slowly increase the amount of protein shake you drink as tolerated °o You may find it easier to slowly sip shakes throughout the day °o It is important to get your proteins in first °• Your fluid goal is to drink 64 - 100 ounces of fluid daily °o It may take a few weeks to build up to this °• 32 oz (or more) should be clear liquids  °And  °• 32 oz (or more) should be full liquids (see below for examples) °• Liquids should not contain sugar, caffeine, or carbonation ° °Clear Liquids: °• Water or Sugar-free flavored water (i.e. Fruit H2O, Propel) °• Decaffeinated coffee or tea (sugar-free) °• Crystal Lite, Wyler’s Lite,  Minute Maid Lite °• Sugar-free Jell-O °• Bouillon or broth °• Sugar-free Popsicle:   *Less than 20 calories each; Limit 1 per day ° °Full Liquids: °Protein Shakes/Drinks + 2 choices per day of other full liquids °• Full liquids must be: °o No More Than 15 grams of Carbs per serving  °o No More Than 3 grams of Fat per serving °• Strained low-fat cream soup (except Cream of Potato or Tomato) °• Non-Fat milk °• Fat-free Lactaid Milk °• Unsweetened Soy Or Unsweetened Almond Milk °• Low Sugar yogurt (Dannon Lite & Fit, Greek yogurt; Oikos Triple Zero; Chobani Simply 100; Yoplait 100 calorie Greek - No Fruit on the Bottom) ° °  °Vitamins   and Minerals • Start 1 day after surgery unless otherwise directed by your surgeon °• Chewable Bariatric Specific Multivitamin / Multimineral Supplement with iron (Example: Bariatric Advantage Multi EA) °• Chewable Calcium with Vitamin D-3 °(Example: 3 Chewable Calcium Plus 600 with Vitamin D-3) °o Take 500 mg three (3) times a day for a total of 1500 mg each day °o Do not take all 3 doses of calcium at one time as it may cause constipation, and you can only absorb 500 mg  at a time  °o Do not mix multivitamins containing iron with calcium supplements; take 2 hours apart °• Menstruating women and those with a history of anemia (a blood disease that causes weakness) may need extra iron °o Talk with your doctor to see if you need more iron °• Do not stop taking or change any vitamins or minerals until you talk to your dietitian or surgeon °• Your Dietitian and/or surgeon must approve all vitamin and mineral supplements °  °Activity and Exercise: Limit your physical activity as instructed by your doctor.  It is important to continue walking at home.  During this time, use these guidelines: °• Do not lift anything greater than ten (10) pounds for at least two (2) weeks °• Do not go back to work or drive until your surgeon says you can °• You may have sex when you feel comfortable  °o It is  VERY important for female patients to use a reliable birth control method; fertility often increases after surgery  °o All hormonal birth control will be ineffective for 30 days after surgery due to medications given during surgery a barrier method must be used. °o Do not get pregnant for at least 18 months °• Start exercising as soon as your doctor tells you that you can °o Make sure your doctor approves any physical activity °• Start with a simple walking program °• Walk 5-15 minutes each day, 7 days per week.  °• Slowly increase until you are walking 30-45 minutes per day °Consider joining our BELT program. [phone] or email [email or web address] °  °Special Instructions Things to remember: °• Use your CPAP when sleeping if this applies to you ° °• [facility] has two free Bariatric Surgery Support Groups that meet monthly °o The 3rd Thursday of each month, 6 pm °• It is very important to keep all follow up appointments with your surgeon, dietitian, primary care physician, and behavioral health practitioner °• Routine follow up schedule with your surgeon include appointments at 2-3 weeks, 6-8 weeks, 6 months, and 1 year at a minimum.  Your surgeon may request to see you more often.   °• After the first year, please follow up with your bariatric surgeon and dietitian at least once a year in order to maintain best weight loss results ° ° °[facility]: [phone] °[facility]: [phone] °Bariatric Nurse Coordinator: [phone] °  °   Reviewed and Endorsed  °by [facility] Patient Education Committee, June, 2016 °Edits Approved: Aug, 2018 ° ° ° °

## 2020-02-03 ENCOUNTER — Encounter (HOSPITAL_COMMUNITY): Payer: Self-pay | Admitting: General Surgery

## 2020-02-03 ENCOUNTER — Other Ambulatory Visit (HOSPITAL_COMMUNITY): Payer: Self-pay | Admitting: General Surgery

## 2020-02-03 LAB — COMPREHENSIVE METABOLIC PANEL
ALT: 166 U/L — ABNORMAL HIGH (ref 0–44)
AST: 136 U/L — ABNORMAL HIGH (ref 15–41)
Albumin: 3.6 g/dL (ref 3.5–5.0)
Alkaline Phosphatase: 90 U/L (ref 38–126)
Anion gap: 6 (ref 5–15)
BUN: 10 mg/dL (ref 6–20)
CO2: 26 mmol/L (ref 22–32)
Calcium: 8.8 mg/dL — ABNORMAL LOW (ref 8.9–10.3)
Chloride: 108 mmol/L (ref 98–111)
Creatinine, Ser: 0.94 mg/dL (ref 0.44–1.00)
GFR, Estimated: >60 mL/min (ref >60)
Glucose, Bld: 135 mg/dL — ABNORMAL HIGH (ref 70–99)
Potassium: 4.8 mmol/L (ref 3.5–5.1)
Sodium: 140 mmol/L (ref 135–145)
Total Bilirubin: 0.6 mg/dL (ref 0.3–1.2)
Total Protein: 6.6 g/dL (ref 6.5–8.1)

## 2020-02-03 LAB — CBC WITH DIFFERENTIAL/PLATELET
Abs Immature Granulocytes: 0.08 10*3/uL — ABNORMAL HIGH (ref 0.00–0.07)
Basophils Absolute: 0 10*3/uL (ref 0.0–0.1)
Basophils Relative: 0 %
Eosinophils Absolute: 0 10*3/uL (ref 0.0–0.5)
Eosinophils Relative: 0 %
HCT: 38.8 % (ref 36.0–46.0)
Hemoglobin: 12.3 g/dL (ref 12.0–15.0)
Immature Granulocytes: 1 %
Lymphocytes Relative: 9 %
Lymphs Abs: 1.5 10*3/uL (ref 0.7–4.0)
MCH: 30.4 pg (ref 26.0–34.0)
MCHC: 31.7 g/dL (ref 30.0–36.0)
MCV: 95.8 fL (ref 80.0–100.0)
Monocytes Absolute: 1 10*3/uL (ref 0.1–1.0)
Monocytes Relative: 6 %
Neutro Abs: 14.1 10*3/uL — ABNORMAL HIGH (ref 1.7–7.7)
Neutrophils Relative %: 84 %
Platelets: 255 10*3/uL (ref 150–400)
RBC: 4.05 MIL/uL (ref 3.87–5.11)
RDW: 14.5 % (ref 11.5–15.5)
WBC: 16.7 10*3/uL — ABNORMAL HIGH (ref 4.0–10.5)
nRBC: 0 % (ref 0.0–0.2)

## 2020-02-03 LAB — SURGICAL PATHOLOGY

## 2020-02-03 MED ORDER — GABAPENTIN 100 MG PO CAPS: 200.0000 mg | ORAL_CAPSULE | Freq: Two times a day (BID) | ORAL | 0 refills | Status: DC

## 2020-02-03 MED ORDER — ONDANSETRON 4 MG PO TBDP: 4.0000 mg | ORAL_TABLET | Freq: Four times a day (QID) | ORAL | 0 refills | Status: DC | PRN

## 2020-02-03 MED ORDER — OXYCODONE HCL 5 MG PO TABS: 5.0000 mg | ORAL_TABLET | Freq: Four times a day (QID) | ORAL | 0 refills | Status: DC | PRN

## 2020-02-03 MED ORDER — PANTOPRAZOLE SODIUM 40 MG PO TBEC: 40.0000 mg | DELAYED_RELEASE_TABLET | Freq: Every day | ORAL | 0 refills | Status: DC

## 2020-02-03 MED ORDER — ACETAMINOPHEN 500 MG PO TABS: 1000.0000 mg | ORAL_TABLET | Freq: Three times a day (TID) | ORAL | 0 refills | Status: AC

## 2020-02-03 MED ORDER — ENOXAPARIN SODIUM 40 MG/0.4ML ~~LOC~~ SOLN: 40.0000 mg | Freq: Two times a day (BID) | SUBCUTANEOUS | 0 refills | Status: DC

## 2020-02-03 MED FILL — ENOXAPARIN SODIUM 40 MG/0.4: 40 | 28 days supply | Qty: 22 | Fill #0

## 2020-02-03 MED FILL — ONDANSETRON ODT 4 MG TABLET: 4 | 21 days supply | Qty: 18 | Fill #0

## 2020-02-03 MED FILL — PANTOPRAZOLE SOD DR 40 MG T: 40 | 90 days supply | Qty: 90 | Fill #0

## 2020-02-03 MED FILL — GABAPENTIN 100 MG CAPSULE: 100 | 5 days supply | Qty: 20 | Fill #0

## 2020-02-03 MED FILL — oxyCODONE HCL 5 MG TABS: 5 | 2 days supply | Qty: 10 | Fill #0

## 2020-02-03 NOTE — Progress Notes (Signed)
Patient alert and oriented, Post op day 1.  Provided support and encouragement.  Encouraged pulmonary toilet, ambulation and small sips of liquids.  All questions answered.  Will continue to monitor. 

## 2020-02-03 NOTE — Discharge Summary (Signed)
Physician Discharge Summary  Savannah [name] OQH:[id number] DOB: 01-06-1961 DOA: 02/02/2020  PCP: Lawerance Cruel, MD  Admit date: 02/02/2020 Discharge date: 02/03/2020  Recommendations for Outpatient Follow-up:    Follow-up Information    Savannah Pickerel, MD. Go on 02/24/2020.   Specialty: General Surgery Why: at 930 am.  Please arrive 15 minutes prior to appointment.  Thank you Contact information: [address] [place] [address] [phone]        Savannah Hurl, PA-C. Go on 03/29/2020.   Specialty: General Surgery Why: at 215 pm.  Please arrive 15 minutes prior to appointment.  Thank you Contact information: [address] [place] [address] [phone]              Discharge Diagnoses:  Principal Problem:   Severe obesity ([place]) Active Problems:   Unilateral primary osteoarthritis, right knee   Migraines   Fatty liver disease, nonalcoholic   S/P laparoscopic sleeve gastrectomy biliary dyskinesia  Surgical Procedure: xi robotic Sleeve Gastrectomy & cholecystectomy, upper endoscopy  Discharge Condition: Good Disposition: Home  Diet recommendation: Postoperative sleeve gastrectomy diet (liquids only)  Filed Weights   02/02/20 0618  Weight: 123.7 kg     Hospital Course:  The patient was admitted for a planned laparoscopic sleeve gastrectomy and cholecystectomy. Please see operative note. Preoperatively the patient was given 5000 units of subcutaneous heparin for DVT prophylaxis. Postoperative prophylactic Lovenox dosing was started on the evening of postoperative day 0. ERAS protocol was used. On the evening of postoperative day 0, the patient was started on water and ice chips. On postoperative day 1 the patient had no fever or tachycardia and was tolerating water in their diet was gradually advanced throughout the day. The patient was ambulating without difficulty. Their vital signs are stable without fever or tachycardia. Their  hemoglobin had remained stable.  The patient had received discharge instructions and counseling. They were deemed stable for discharge and had met discharge criteria  Her post discharge risk calculation score for the VTE was high an met threshold for 4 weeks of twice a day prophylactic Lovenox injection.  She was given a sample get as well as instructions on how to inject Lovenox.  BP (!) 161/97   Pulse 66   Temp 97.9 F (36.6 C) (Oral)   Resp 18   Ht $R'5\' 5"'qY$  (1.651 m)   Wt 123.7 kg   LMP 10/14/2010   SpO2 97%   BMI 45.36 kg/m   Gen: alert, NAD, non-toxic appearing Pupils: equal, no scleral icterus Pulm: Lungs clear to auscultation, symmetric chest rise CV: regular rate and rhythm Abd: soft, mild approp tender, nondistended. No cellulitis. No incisional hernia Ext: no edema, no calf tenderness Skin: no rash, no jaundice   Discharge Instructions  Discharge Instructions    Ambulate hourly while awake   Complete by: As directed    Call MD for:  difficulty breathing, headache or visual disturbances   Complete by: As directed    Call MD for:  persistant dizziness or light-headedness   Complete by: As directed    Call MD for:  persistant nausea and vomiting   Complete by: As directed    Call MD for:  redness, tenderness, or signs of infection (pain, swelling, redness, odor or green/yellow discharge around incision site)   Complete by: As directed    Call MD for:  severe uncontrolled pain   Complete by: As directed    Call MD for:  temperature >  101 F   Complete by: As directed    Diet bariatric full liquid   Complete by: As directed    Discharge instructions   Complete by: As directed    See bariatric discharge instructions   Incentive spirometry   Complete by: As directed    Perform hourly while awake     Allergies as of 02/03/2020      Reactions   Dust Mite Extract Other (See Comments)   Environmental allergies      Medication List    STOP taking these medications    naproxen 500 MG tablet Commonly known as: NAPROSYN   promethazine 25 MG tablet Commonly known as: PHENERGAN   venlafaxine XR 37.5 MG 24 hr capsule Commonly known as: Effexor XR     TAKE these medications   acetaminophen 500 MG tablet Commonly known as: TYLENOL Take 2 tablets (1,000 mg total) by mouth every 8 (eight) hours for 5 days.   BARIATRIC MULTIVITAMINS/IRON PO Take 1 tablet by mouth daily. BariMelts Multivitamin W/Iron What changed: Another medication with the same name was removed. Continue taking this medication, and follow the directions you see here.   Caltrate 600+D Plus Minerals 600-800 MG-UNIT Chew Chew 1 tablet by mouth in the morning, at noon, and at bedtime. (mid morning, at lunch, & in the afternoon)   carvedilol 6.25 MG tablet Commonly known as: COREG Take 1 tablet (6.25 mg total) by mouth 2 (two) times daily with a meal.   clobetasol cream 0.05 % Commonly known as: TEMOVATE Apply topically 2 (two) times daily. What changed:   how much to take  when to take this  reasons to take this   cyclobenzaprine 10 MG tablet Commonly known as: FLEXERIL Take 1 tablet (10 mg total) by mouth 3 (three) times daily as needed for muscle spasms.   eletriptan 40 MG tablet Commonly known as: RELPAX Take 1 tablet (40 mg total) by mouth 2 (two) times daily as needed for migraine or headache.   enoxaparin 40 MG/0.4ML injection Commonly known as: LOVENOX Inject 0.4 mLs (40 mg total) into the skin every 12 (twelve) hours for 28 days.   furosemide 20 MG tablet Commonly known as: LASIX Take 0.5 tablets (10 mg total) by mouth daily as needed for edema. Notes to patient: Monitor Blood Pressure Daily and keep a log for primary care physician.  Monitor for symptoms of dehydration.  You may need to make changes to your medications with rapid weight loss.     gabapentin 100 MG capsule Commonly known as: NEURONTIN Take 2 capsules (200 mg total) by mouth every 12 (twelve)  hours.   hyoscyamine 0.125 MG tablet Commonly known as: LEVSIN Take 1 tablet (0.125 mg total) by mouth every 4 (four) hours as needed. What changed: reasons to take this   lisinopril 10 MG tablet Commonly known as: ZESTRIL TAKE 1 TABLET DAILY   meclizine 25 MG tablet Commonly known as: ANTIVERT Take 25 mg by mouth 4 (four) times daily as needed (dizziness/nausea/vertigo).   mometasone 0.1 % ointment Commonly known as: ELOCON Apply topically daily. What changed:   how much to take  when to take this  reasons to take this   [place] 40-5-3.3 MG Tabs Generic drug: Collagen-Boron-Hyaluronic Acid Take 1 tablet by mouth daily.   ondansetron 4 MG disintegrating tablet Commonly known as: ZOFRAN-ODT Take 1 tablet (4 mg total) by mouth every 6 (six) hours as needed for nausea or vomiting.   oxyCODONE  5 MG immediate release tablet Commonly known as: Oxy IR/ROXICODONE Take 1 tablet (5 mg total) by mouth every 6 (six) hours as needed for severe pain.   pantoprazole 40 MG tablet Commonly known as: PROTONIX Take 1 tablet (40 mg total) by mouth daily.   Vitamin D3 50 MCG (2000 UT) Tabs Take 2,000 Units by mouth every morning.       Follow-up Information    Savannah Pickerel, MD. Go on 02/24/2020.   Specialty: General Surgery Why: at 930 am.  Please arrive 15 minutes prior to appointment.  Thank you Contact information: [address] [place] [address] [phone]        Savannah Hurl, PA-C. Go on 03/29/2020.   Specialty: General Surgery Why: at 215 pm.  Please arrive 15 minutes prior to appointment.  Thank you Contact information: [address] [place] [address] [phone]                The results of significant diagnostics from this hospitalization (including imaging, microbiology, ancillary and laboratory) are listed below for reference.    Significant Diagnostic Studies: No results found.  Labs: Basic  Metabolic Panel: Recent Labs  Lab 01/28/20 1346 02/03/20 0526  NA 139 140  K 3.8 4.8  CL 107 108  CO2 23 26  GLUCOSE 102* 135*  BUN 24* 10  CREATININE 0.93 0.94  CALCIUM 9.8 8.8*   Liver Function Tests: Recent Labs  Lab 01/28/20 1346 02/03/20 0526  AST 35 136*  ALT 44 166*  ALKPHOS 102 90  BILITOT 0.8 0.6  PROT 7.3 6.6  ALBUMIN 4.0 3.6    CBC: Recent Labs  Lab 01/28/20 1346 02/02/20 1105 02/03/20 0526  WBC 9.0  --  16.7*  NEUTROABS 5.7  --  14.1*  HGB 13.9 12.8 12.3  HCT 42.5 39.7 38.8  MCV 91.8  --  95.8  PLT 298  --  255    CBG: No results for input(s): GLUCAP in the last 168 hours.  Principal Problem:   Severe obesity ([place]) Active Problems:   Unilateral primary osteoarthritis, right knee   Migraines   Fatty liver disease, nonalcoholic   S/P laparoscopic sleeve gastrectomy s/p robotic cholecystectomy  Time coordinating discharge: 20 min  Signed:  Gayland Curry, MD [facility], [place] 02/03/2020, 2:30 PM

## 2020-02-03 NOTE — Progress Notes (Signed)
Patient alert and oriented, pain is controlled. Patient is tolerating fluids, advanced to protein shake today, patient is tolerating well.  Reviewed Gastric sleeve discharge instructions with patient and patient is able to articulate understanding.  Provided information on BELT program, Support Group and [facility] outpatient pharmacy. All questions answered, will continue to monitor.  

## 2020-02-03 NOTE — Progress Notes (Signed)
Lovenox teaching done with patient and family. Family member injected medication with no issues.

## 2020-02-09 ENCOUNTER — Telehealth (HOSPITAL_COMMUNITY): Payer: Self-pay

## 2020-02-09 NOTE — Telephone Encounter (Signed)
Patient called to discuss post bariatric surgery follow up questions.  See below:   1.  Tell me about your pain and pain management?denies  2.  Let's talk about fluid intake.  How much total fluid are you taking in?50-60 ounces  3.  How much protein have you taken in the last 2 days?55-60 grams of protein  4.  Have you had nausea?  Tell me about when have experienced nausea and what you did to help?denies  5.  Has the frequency or color changed with your urine?urine clear  6.  Tell me what your incisions look like?no problems itchy  7.  Have you been passing gas? BM?daily bm since Thursday  8.  If a problem or question were to arise who would you call?  Do you know contact numbers for [facility], [facility], and [facility]?aware of  How to contact all services  9.  How has the walking going?regularly  10.  How are your vitamins and calcium going?  How are you taking them?started mvi and calcium

## 2020-02-16 ENCOUNTER — Encounter: Payer: BC Managed Care – PPO | Attending: General Surgery | Admitting: Skilled Nursing Facility1

## 2020-02-16 ENCOUNTER — Other Ambulatory Visit: Payer: Self-pay

## 2020-02-23 ENCOUNTER — Encounter: Payer: Self-pay | Admitting: Family Medicine

## 2020-02-23 ENCOUNTER — Telehealth: Payer: Self-pay | Admitting: Skilled Nursing Facility1

## 2020-02-23 NOTE — Progress Notes (Signed)
2 Week Post-Operative Nutrition Class   Patient was seen on 04/08/18 for Post-Operative Nutrition education at [facility].    Surgery date: 02/02/2020 Surgery type: Sleeve Start weight at [facility]: 280 lb Weight today: 258.1 lb   Body Composition Scale 02/16/2020  Current Body Weight 258.1  Total Body Fat % 47  Visceral Fat 19  Fat-Free Mass % 52.9   Total Body Water % 40.9  Muscle-Mass lbs 30.6  BMI 42.6  Body Fat Displacement          Torso  lbs 75.2         Left Leg  lbs 15         Right Leg  lbs 15         Left Arm  lbs 7.5         Right Arm   lbs 7.5       The following the learning objectives were met by the patient during this course:  Identifies Phase 3 (Soft, High Proteins) Dietary Goals and will begin from 2 weeks post-operatively to 2 months post-operatively  Identifies appropriate sources of fluids and proteins   Identifies appropriate fat sources and healthy verses unhealthy fat types    States protein recommendations and appropriate sources post-operatively  Identifies the need for appropriate texture modifications, mastication, and bite sizes when consuming solids  Identifies appropriate multivitamin and calcium sources post-operatively  Describes the need for physical activity post-operatively and will follow MD recommendations  States when to call healthcare provider regarding medication questions or post-operative complications   Handouts given during class include:  Phase 3A: Soft, High Protein Diet Handout  Phase 3 High Protein Meals  Healthy Fats   Follow-Up Plan: Patient will follow-up at [facility] in 6 weeks for 2 month post-op nutrition visit for diet advancement per MD.

## 2020-02-23 NOTE — Telephone Encounter (Signed)
RD called pt to verify fluid intake once starting soft, solid proteins 2 week post-bariatric surgery.   Daily Fluid intake: Daily Protein intake:  Concerns/issues:   LVM 

## 2020-02-24 ENCOUNTER — Telehealth: Payer: Self-pay | Admitting: Skilled Nursing Facility1

## 2020-02-24 NOTE — Telephone Encounter (Signed)
Returned pt's call. LVM.

## 2020-03-14 ENCOUNTER — Telehealth: Payer: BC Managed Care – PPO | Admitting: Nurse Practitioner

## 2020-03-14 NOTE — Progress Notes (Signed)
Telehealth Visit     Virtual Visit via Video Note   This visit type was conducted due to national recommendations for restrictions regarding the COVID-19 Pandemic (e.g. social distancing) in an effort to limit this patient's exposure and mitigate transmission in our community.  Due to her co-morbid illnesses, this patient is at least at moderate risk for complications without adequate follow up.  This format is felt to be most appropriate for this patient at this time.  All issues noted in this document were discussed and addressed.  A limited physical exam was performed with this format.  Please refer to the patient's chart for her consent to telehealth for [facility].      Evaluation Performed:  Follow-up visit   The patient was identified using 2 identifiers.   This visit type was conducted due to national recommendations for restrictions regarding the COVID-19 Pandemic (e.g. social distancing).  This format is felt to be most appropriate for this patient at this time.  All issues noted in this document were discussed and addressed.  No physical exam was performed (except for noted visual exam findings with Video Visits).  Please refer to the patient's chart (MyChart message for video visits and phone note for telephone visits) for the patient's consent to telehealth for [facility].  Date:  03/28/2020   ID:  Savannah [name], DOB 1960-04-29, MRN [id number]  Patient Location:  Home  Provider location:   [facility] Office  PCP:  Lawerance Cruel, MD  Cardiologist:  Jenkins Rouge, MD  Electrophysiologist:  None   Chief Complaint:  Follow up.   History of Present Illness:    Savannah [name] is a 60 y.o. female who presents via audio/video conferencing for a telehealth visit today.  Seen for Dr. Johnsie Cancel.   She has a history of NICM, PVCs - asymptomatic, no CAD, migraines, obesity and prior shingles.   She has been on ACE and beta blocker. Losartan changed to lisinopril due  to concerns for possible insomnia.   Last seen in July by Dr. Johnsie Cancel.   Seen today via video. She has consented for this visit. She is doing well. Had her gastric sleeve back in December - down already almost 40 pounds. She is moving more, exercising more. No chest pain. Breathing is stable. No swelling. BP is ok. Not lightheaded or dizzy. Planning on knee surgery in the next couple of months. Tolerating her medicines. LFTs came down on recheck. She is happy with how she is doing.    Past Medical History:  Diagnosis Date  . Arthritis    bilateral knees, recent LCL sprain-on left knee  . CHF (congestive heart failure) ([place])   . Dysrhythmia    trigem  . Environmental allergies   . Family history of adverse reaction to anesthesia    slow to wake up  . Fatty liver   . Headache(784.0)    migraines-on meds for control, relpax, ketoprophen, vicoden, muscle relaxer  . Hearing loss 2016   High frequency hearing - Bilateral   . History of hiatal hernia   . Hot flashes   . Joint pain   . Lichen sclerosus   . Lipoma    Right Knee  . Menorrhagia   . Migraines   . Necrobiosis lipoidica   . Osteoarthritis   . Overweight   . Pelvic pain in female   . PONV (postoperative nausea and vomiting)    ponv, slow to awake  . Seasonal allergies   .  Sinus problem   . Sprain 10/25/2010   left knee  . Trigeminal pulse   . Trigger finger, right    Middle finger and right wrist   . Vitamin D deficiency    Past Surgical History:  Procedure Laterality Date  . ABDOMINAL HYSTERECTOMY    . APPENDECTOMY    . DIAGNOSTIC LAPAROSCOPY  08/2006  . DILATION AND CURETTAGE OF UTERUS  2011   attempted ablation  . FUNCTIONAL ENDOSCOPIC SINUS SURGERY    . ganglion cyst removal    . HYSTEROSCOPY     failed  . KNEE ARTHROPLASTY Right 2018   meniscus  . LASIK    . LIPOMA EXCISION Right 07/22/2014   Procedure: EXCISION RIGHT THIGH LIPOMA;  Surgeon: Erroll Luna, MD;  Location: [facility];   Service: General;  Laterality: Right;  . ROBOTIC ASSISTED TOTAL HYSTERECTOMY  11/06/10   TLH/RSO  . SALPINGOOPHORECTOMY  11/06/2010   Procedure: SALPINGO OOPHERECTOMY;  Surgeon: Felipa Emory;  Location: [facility];  Service: Gynecology;  Laterality: Right;  . TRIGGER FINGER RELEASE    . UPPER GI ENDOSCOPY N/A 02/02/2020   Procedure: UPPER GI ENDOSCOPY;  Surgeon: [name], MD;  Location: [facility];  Service: General;  Laterality: N/A;     Current Meds  Medication Sig  . Calcium Carbonate-Vit D-Min (CALTRATE 600+D PLUS MINERALS) 600-800 MG-UNIT CHEW Chew 1 tablet by mouth in the morning, at noon, and at bedtime. (mid morning, at lunch, & in the afternoon)  . carvedilol (COREG) 6.25 MG tablet Take 1 tablet (6.25 mg total) by mouth 2 (two) times daily with a meal.  . clobetasol cream (TEMOVATE) 8.09 % Apply 1 application topically as needed (lichen sclerosus).  . Collagen-Boron-Hyaluronic Acid (MOVE FREE ULTRA JOINT HEALTH) 40-5-3.3 MG TABS Take 1 tablet by mouth daily.  . cyclobenzaprine (FLEXERIL) 10 MG tablet Take 1 tablet (10 mg total) by mouth 3 (three) times daily as needed for muscle spasms.  [name] eletriptan (RELPAX) 40 MG tablet Take 1 tablet (40 mg total) by mouth 2 (two) times daily as needed for migraine or headache.  . furosemide (LASIX) 20 MG tablet Take 0.5 tablets (10 mg total) by mouth daily as needed for edema.  . hyoscyamine (LEVSIN) 0.125 MG tablet Take 0.125 mg by mouth every 4 (four) hours as needed (abdominal cramps/spasms).  [name] lisinopril (ZESTRIL) 10 MG tablet TAKE 1 TABLET DAILY  . meclizine (ANTIVERT) 25 MG tablet Take 25 mg by mouth 4 (four) times daily as needed (dizziness/nausea/vertigo).   . mometasone (ELOCON) 0.1 % ointment Apply 0.1 % topically as needed (lichen sclerosus).  . Multiple Vitamins-Minerals (BARIATRIC MULTIVITAMINS/IRON PO) Take 1 tablet by mouth daily. BariMelts Multivitamin W/Iron  . multivitamin-lutein (OCUVITE-LUTEIN) CAPS capsule Take 1 capsule by  mouth daily.  . ondansetron (ZOFRAN-ODT) 4 MG disintegrating tablet Take 1 tablet (4 mg total) by mouth every 6 (six) hours as needed for nausea or vomiting.  . pantoprazole (PROTONIX) 40 MG tablet Take 1 tablet (40 mg total) by mouth daily.  . [DISCONTINUED] clobetasol cream (TEMOVATE) 0.05 % Apply topically 2 (two) times daily. (Patient taking differently: Apply 1 application topically 2 (two) times daily as needed (lichen sclerosus).)  . [DISCONTINUED] hyoscyamine (LEVSIN) 0.125 MG tablet Take 1 tablet (0.125 mg total) by mouth every 4 (four) hours as needed. (Patient taking differently: Take 0.125 mg by mouth every 4 (four) hours as needed (abdominal cramps/spasms.).)  . [DISCONTINUED] mometasone (ELOCON) 0.1 % ointment Apply topically daily. (Patient taking differently: Apply 1  application topically daily as needed (lichen sclerosus).)     Allergies:   Dust mite extract   Social History   Tobacco Use  . Smoking status: Never Smoker  . Smokeless tobacco: Never Used  Vaping Use  . Vaping Use: Never used  Substance Use Topics  . Alcohol use: No    Alcohol/week: 0.0 standard drinks  . Drug use: No     Family Hx: The patient's family history includes Breast cancer (age of onset: 55) in her maternal aunt and paternal aunt; Heart disease in her father; High Cholesterol in her father; High blood pressure in her father and mother; Migraines in her brother and mother; Skin cancer in her mother.  ROS:   Please see the history of present illness.   All other systems reviewed are negative.    Objective:    Vital Signs:  BP 101/64   Pulse 80   Ht 5\' 5"  (1.651 m)   Wt 243 lb (110.2 kg)   LMP 10/14/2010   SpO2 94%   BMI 40.44 kg/m    Wt Readings from Last 3 Encounters:  03/28/20 243 lb (110.2 kg)  02/23/20 242 lb 9.6 oz (110 kg)  02/02/20 272 lb 9.6 oz (123.7 kg)    Alert female in no acute distress. She is not short of breath with conversation. Weight is trending down.     Labs/Other Tests and Data Reviewed:    Lab Results  Component Value Date   WBC 16.7 (H) 02/03/2020   HGB 12.3 02/03/2020   HCT 38.8 02/03/2020   PLT 255 02/03/2020   GLUCOSE 135 (H) 02/03/2020   CHOL 166 07/21/2018   TRIG 133 07/21/2018   HDL 44 07/21/2018   LDLCALC 95 07/21/2018   ALT 166 (H) 02/03/2020   AST 136 (H) 02/03/2020   NA 140 02/03/2020   K 4.8 02/03/2020   CL 108 02/03/2020   CREATININE 0.94 02/03/2020   BUN 10 02/03/2020   CO2 26 02/03/2020   TSH 3.670 10/18/2017   HGBA1C 5.6 07/21/2018       BNP (last 3 results) No results for input(s): BNP in the last 8760 hours.  ProBNP (last 3 results) No results for input(s): PROBNP in the last 8760 hours.    Prior CV studies:    The following studies were reviewed today:  ECHO IMPRESSIONS 09/2019  1. Left ventricular ejection fraction, by estimation, is 50%. The left  ventricle has mildly reduced systolic function. Mild global hypokinesis.  Left ventricular diastolic parameters are consistent with Grade I  diastolic dysfunction (impaired  relaxation). GLS -23.2%, normal.  2. Right ventricular systolic function is normal. The right ventricular  size is normal. Tricuspid regurgitation signal is inadequate for assessing  PA pressure.  3. The aortic valve is tricuspid. Aortic valve regurgitation is not  visualized. No aortic stenosis is present.  4. The mitral valve is normal in structure. No evidence of mitral valve  regurgitation. No evidence of mitral stenosis.  5. The inferior vena cava is normal in size with greater than 50%  respiratory variability, suggesting right atrial pressure of 3 mmHg.   Comparison(s): 09/10/17 EF 45-50%.    Myoview Study Highlights 09/2018   EF is 51 %. Visually, the EF appears to be greater than 51%  This is a low risk study. There is no evidence of ischemia or infarction .  The study is normal    ASSESSMENT & PLAN:    1. NICM - last echo from August  noted -  EF now 50% - she is doing well. NYHA I/II - no changes made today. Remains on beta blocker, ACE and prn Lasix.   2. Prior chest pain - normal Myoview in 2020 and calcium score of 0 - no longer endorsed.   3. Prior elevated LFTs - improving.   4. Obesity - now s/p gastric sleeve - actively losing weight.   5. Upcoming knee surgery - would be acceptable candidate from our standpoint as long as her current situation remains stable/unchanged.    Patient Risk:   After full review of this patient's clinical status, I feel that they are at least moderate risk at this time.  Time:   Today, I have spent 7 minutes with the patient with telehealth technology discussing the above issues.     Medication Adjustments/Labs and Tests Ordered: Current medicines are reviewed at length with the patient today.  Concerns regarding medicines are outlined above.   Tests Ordered: No orders of the defined types were placed in this encounter.   Medication Changes: No orders of the defined types were placed in this encounter.   Disposition:  FU with Dr. Johnsie Cancel in 6 months. I have asked her to keep a check on her BP as she continues to lose weight - call for any lightheaded/dizzy/hypotension. Otherwise, no change with current regimen.     Patient is agreeable to this plan and will call if any problems develop in the interim.   Amie Critchley, NP  03/28/2020 9:46 AM    [place]

## 2020-03-21 ENCOUNTER — Telehealth: Payer: Self-pay | Admitting: *Deleted

## 2020-03-21 NOTE — Telephone Encounter (Signed)
  Patient Consent for Virtual Visit         Savannah [name] has provided verbal consent on 03/21/2020 for a virtual visit (video or telephone).   CONSENT FOR VIRTUAL VISIT FOR:  Savannah [name]  By participating in this virtual visit I agree to the following:  I hereby voluntarily request, consent and authorize [facility] and its employed or contracted physicians, physician assistants, nurse practitioners or other licensed health care professionals (the Practitioner), to provide me with telemedicine health care services (the "Services") as deemed necessary by the treating Practitioner. I acknowledge and consent to receive the Services by the Practitioner via telemedicine. I understand that the telemedicine visit will involve communicating with the Practitioner through live audiovisual communication technology and the disclosure of certain medical information by electronic transmission. I acknowledge that I have been given the opportunity to request an in-person assessment or other available alternative prior to the telemedicine visit and am voluntarily participating in the telemedicine visit.  I understand that I have the right to withhold or withdraw my consent to the use of telemedicine in the course of my care at any time, without affecting my right to future care or treatment, and that the Practitioner or I may terminate the telemedicine visit at any time. I understand that I have the right to inspect all information obtained and/or recorded in the course of the telemedicine visit and may receive copies of available information for a reasonable fee.  I understand that some of the potential risks of receiving the Services via telemedicine include:  [name] Delay or interruption in medical evaluation due to technological equipment failure or disruption; . Information transmitted may not be sufficient (e.g. poor resolution of images) to allow for appropriate medical decision making by the Practitioner;  and/or  . In rare instances, security protocols could fail, causing a breach of personal health information.  Furthermore, I acknowledge that it is my responsibility to provide information about my medical history, conditions and care that is complete and accurate to the best of my ability. I acknowledge that Practitioner's advice, recommendations, and/or decision may be based on factors not within their control, such as incomplete or inaccurate data provided by me or distortions of diagnostic images or specimens that may result from electronic transmissions. I understand that the practice of medicine is not an exact science and that Practitioner makes no warranties or guarantees regarding treatment outcomes. I acknowledge that a copy of this consent can be made available to me via my patient portal ([facility]), or I can request a printed copy by calling the office of [facility].    I understand that my insurance will be billed for this visit.   I have read or had this consent read to me. . I understand the contents of this consent, which adequately explains the benefits and risks of the Services being provided via telemedicine.  . I have been provided ample opportunity to ask questions regarding this consent and the Services and have had my questions answered to my satisfaction. . I give my informed consent for the services to be provided through the use of telemedicine in my medical care

## 2020-03-28 ENCOUNTER — Encounter: Payer: Self-pay | Admitting: Nurse Practitioner

## 2020-03-28 ENCOUNTER — Telehealth (INDEPENDENT_AMBULATORY_CARE_PROVIDER_SITE_OTHER): Payer: BC Managed Care – PPO | Admitting: Nurse Practitioner

## 2020-03-28 ENCOUNTER — Other Ambulatory Visit: Payer: Self-pay

## 2020-03-28 VITALS — BP 101/64 | HR 80 | Ht 65.0 in | Wt 243.0 lb

## 2020-03-28 DIAGNOSIS — I5022 Chronic systolic (congestive) heart failure: Secondary | ICD-10-CM

## 2020-03-28 DIAGNOSIS — Z01810 Encounter for preprocedural cardiovascular examination: Secondary | ICD-10-CM

## 2020-03-28 DIAGNOSIS — I428 Other cardiomyopathies: Secondary | ICD-10-CM

## 2020-03-28 NOTE — Patient Instructions (Addendum)
After Visit Summary:  We will be checking the following labs today - NONE   Medication Instructions:    Continue with your current medicines.    If you need a refill on your cardiac medications before your next appointment, please call your pharmacy.     Testing/Procedures To Be Arranged:  N/A  Follow-Up:   See Dr. Johnsie Cancel in 6 months -  You will receive a reminder letter in the mail two months in advance. If you don't receive a letter, please call our office to schedule the follow-up appointment.   At [facility], you and your health needs are our priority.  As part of our continuing mission to provide you with exceptional heart care, we have created designated Provider Care Teams.  These Care Teams include your primary Cardiologist (physician) and Advanced Practice Providers (APPs -  Physician Assistants and Nurse Practitioners) who all work together to provide you with the care you need, when you need it.  Special Instructions:  . Stay safe, wash your hands for at least 20 seconds and wear a mask when needed.  . It was good to talk with you today.  . You should be an acceptable candidate for knee surgery from our standpoint.    Call the [place] office at [phone] if you have any questions, problems or concerns.

## 2020-03-29 ENCOUNTER — Ambulatory Visit: Payer: BC Managed Care – PPO | Admitting: Skilled Nursing Facility1

## 2020-03-30 ENCOUNTER — Encounter: Payer: BC Managed Care – PPO | Attending: General Surgery | Admitting: Skilled Nursing Facility1

## 2020-03-30 ENCOUNTER — Other Ambulatory Visit: Payer: Self-pay

## 2020-03-30 NOTE — Progress Notes (Signed)
Bariatric Nutrition Follow-Up Visit Medical Nutrition Therapy   2 Months Post-Operative   NUTRITION ASSESSMENT    Anthropometrics  Surgery date: 02/02/2020 Surgery type: Sleeve Start weight at [facility]: 280 lb Weight today: 243.8 lb   Body Composition Scale 02/16/2020 03/30/2020  Current Body Weight 258.1 243.8  Total Body Fat % 47 45.7  Visceral Fat 19 17  Fat-Free Mass % 52.9 54.2   Total Body Water % 40.9 41.6  Muscle-Mass lbs 30.6 30.5  BMI 42.6 40.2  Body Fat Displacement           Torso  lbs 75.2 69.1         Left Leg  lbs 15 13.8         Right Leg  lbs 15 13.8         Left Arm  lbs 7.5 6.9         Right Arm   lbs 7.5 6.9    Clinical  Medical hx: prediabetes, fatty liver Medications:  Labs:    Lifestyle & Dietary Hx  Pt states when she reaches a BMI of 39.9 she can get a knee replacement.   Estimated daily fluid intake: 70+ oz Estimated daily protein intake: 60+ g Supplements: multi + calcium Current average weekly physical activity: walking 5 days a week, resistance bands 2 days a week  24-Hr Dietary Recall First Meal: yogurt + protein water Snack:  Second Meal: pork roast  Snack: cheese stick (1-2 times a week) Third Meal: beef or pork or chicken Snack: sugar free pudding or jello Beverages: water, water + flavorings   Post-Op Goals/ Signs/ Symptoms Using straws: no Drinking while eating: no Chewing/swallowing difficulties: no Changes in vision: no Changes to mood/headaches: no Hair loss/changes to skin/nails: no Difficulty focusing/concentrating: no Sweating: no Dizziness/lightheadedness: no Palpitations: no  Carbonated/caffeinated beverages: no N/V/D/C/Gas: no Abdominal pain: no Dumping syndrome: no    NUTRITION DIAGNOSIS  Overweight/obesity (-3.3) related to past poor dietary habits and physical inactivity as evidenced by completed bariatric surgery and following dietary guidelines for continued weight loss and healthy nutrition  status.     NUTRITION INTERVENTION Nutrition counseling (C-1) and education (E-2) to facilitate bariatric surgery goals, including: . Diet advancement to the next phase (phase 4) now including non starchy vegetables . The importance of consuming adequate calories as well as certain nutrients daily due to the body's need for essential vitamins, minerals, and fats . The importance of daily physical activity and to reach a goal of at least 150 minutes of moderate to vigorous physical activity weekly (or as directed by their physician) due to benefits such as increased musculature and improved lab values . The importance of intuitive eating specifically learning hunger-satiety cues and understanding the importance of learning a new body: The importance of mindful eating to avoid grazing behaviors   Handouts Provided Include   Phase 4  Learning Style & Readiness for Change Teaching method utilized: Visual & Auditory  Demonstrated degree of understanding via: Teach Back  Readiness Level: Action Barriers to learning/adherence to lifestyle change: none identified   RD's Notes for Next Visit . Assess adherence to pt chosen goals   MONITORING & EVALUATION Dietary intake, weekly physical activity, body weight  Next Steps Patient is to follow-up in  3-4 months

## 2020-04-26 ENCOUNTER — Encounter (HOSPITAL_BASED_OUTPATIENT_CLINIC_OR_DEPARTMENT_OTHER): Payer: Self-pay

## 2020-04-28 NOTE — Telephone Encounter (Signed)
Could you call this pt for an appt?  Thank you.

## 2020-05-01 ENCOUNTER — Other Ambulatory Visit: Payer: Self-pay | Admitting: Cardiovascular Disease

## 2020-05-03 ENCOUNTER — Ambulatory Visit: Payer: BC Managed Care – PPO

## 2020-05-04 ENCOUNTER — Ambulatory Visit (INDEPENDENT_AMBULATORY_CARE_PROVIDER_SITE_OTHER): Payer: BC Managed Care – PPO | Admitting: Obstetrics & Gynecology

## 2020-05-04 ENCOUNTER — Other Ambulatory Visit: Payer: Self-pay

## 2020-05-04 ENCOUNTER — Encounter (HOSPITAL_BASED_OUTPATIENT_CLINIC_OR_DEPARTMENT_OTHER): Payer: Self-pay | Admitting: Obstetrics & Gynecology

## 2020-05-04 VITALS — BP 118/76 | HR 72 | Ht 63.75 in | Wt 235.0 lb

## 2020-05-04 DIAGNOSIS — L900 Lichen sclerosus et atrophicus: Secondary | ICD-10-CM

## 2020-05-04 DIAGNOSIS — G43909 Migraine, unspecified, not intractable, without status migrainosus: Secondary | ICD-10-CM | POA: Diagnosis not present

## 2020-05-04 DIAGNOSIS — Z9884 Bariatric surgery status: Secondary | ICD-10-CM

## 2020-05-04 DIAGNOSIS — Z01419 Encounter for gynecological examination (general) (routine) without abnormal findings: Secondary | ICD-10-CM

## 2020-05-04 DIAGNOSIS — Z90710 Acquired absence of both cervix and uterus: Secondary | ICD-10-CM | POA: Diagnosis not present

## 2020-05-04 DIAGNOSIS — E559 Vitamin D deficiency, unspecified: Secondary | ICD-10-CM | POA: Diagnosis not present

## 2020-05-04 DIAGNOSIS — R931 Abnormal findings on diagnostic imaging of heart and coronary circulation: Secondary | ICD-10-CM

## 2020-05-04 MED ORDER — CLOBETASOL PROPIONATE 0.05 % EX CREA: 1.0000 "application " | TOPICAL_CREAM | CUTANEOUS | 1 refills | Status: DC | PRN

## 2020-05-04 MED ORDER — MOMETASONE FUROATE 0.1 % EX OINT: TOPICAL_OINTMENT | CUTANEOUS | 3 refills | Status: DC

## 2020-05-04 NOTE — Progress Notes (Signed)
60 y.o. G0P0000 Married White or Caucasian female here for annual exam.  Had gastric sleeve procedure in December.  Down 45 pounds.  Had gall bladder removed at the same time.  Has follow up in May.  Down 45 pounds.  Has started to have some hair thinning.  Taking biotin now.    Denies vaginal bleeding.    Having some vulvar itching right now.  Pt feels like she's been under good control most of the past year.   Patient's last menstrual period was 10/14/2010.          The current method of family planning is post menopausal status.    Exercising: Yes.    Smoker:  yes  Health Maintenance: Pap:  Not inicated History of abnormal Pap:  no MMG:  08/2019 Colonoscopy:  10/2019 BMD:   Discussed doing this 2 year after surgery TDaP:  08/03/2016 Pneumonia vaccine(s):  Not indicated Shingrix:    Not indicated Hep C testing: 04/15/2015 Screening Labs: done with [facility] before surgery   reports that she has never smoked. She has never used smokeless tobacco. She reports that she does not drink alcohol and does not use drugs.  Past Medical History:  Diagnosis Date  . Arthritis    bilateral knees, recent LCL sprain-on left knee  . CHF (congestive heart failure) ([place])   . Dysrhythmia    trigem  . Environmental allergies   . Family history of adverse reaction to anesthesia    slow to wake up  . Fatty liver   . Headache(784.0)    migraines-on meds for control, relpax, ketoprophen, vicoden, muscle relaxer  . Hearing loss 2016   High frequency hearing - Bilateral   . History of hiatal hernia   . Hot flashes   . Joint pain   . Lichen sclerosus   . Lipoma    Right Knee  . Menorrhagia   . Migraines   . Necrobiosis lipoidica   . Osteoarthritis   . Overweight   . Pelvic pain in female   . PONV (postoperative nausea and vomiting)    ponv, slow to awake  . Seasonal allergies   . Sinus problem   . Sprain 10/25/2010   left knee  . Trigeminal pulse   . Trigger finger, right    Middle  finger and right wrist   . Vitamin D deficiency     Past Surgical History:  Procedure Laterality Date  . ABDOMINAL HYSTERECTOMY    . APPENDECTOMY    . DIAGNOSTIC LAPAROSCOPY  08/2006  . DILATION AND CURETTAGE OF UTERUS  2011   attempted ablation  . FUNCTIONAL ENDOSCOPIC SINUS SURGERY    . ganglion cyst removal    . HYSTEROSCOPY     failed  . KNEE ARTHROPLASTY Right 2018   meniscus  . LAPAROSCOPIC GASTRIC BAND REMOVAL WITH LAPAROSCOPIC GASTRIC SLEEVE RESECTION  02/02/2020  . LASIK    . LIPOMA EXCISION Right 07/22/2014   Procedure: EXCISION RIGHT THIGH LIPOMA;  Surgeon: Erroll Luna, MD;  Location: [facility];  Service: General;  Laterality: Right;  . ROBOTIC ASSISTED TOTAL HYSTERECTOMY  11/06/10   TLH/RSO  . SALPINGOOPHORECTOMY  11/06/2010   Procedure: SALPINGO OOPHERECTOMY;  Surgeon: Felipa Emory;  Location: [facility];  Service: Gynecology;  Laterality: Right;  . TRIGGER FINGER RELEASE    . UPPER GI ENDOSCOPY N/A 02/02/2020   Procedure: UPPER GI ENDOSCOPY;  Surgeon: [name], MD;  Location: [facility];  Service: General;  Laterality: N/A;  Current Outpatient Medications  Medication Sig Dispense Refill  . Calcium Carbonate-Vit D-Min (CALTRATE 600+D PLUS MINERALS) 600-800 MG-UNIT CHEW Chew 1 tablet by mouth in the morning, at noon, and at bedtime. (mid morning, at lunch, & in the afternoon)    . carvedilol (COREG) 6.25 MG tablet Take 1 tablet (6.25 mg total) by mouth 2 (two) times daily with a meal. 180 tablet 3  . clobetasol cream (TEMOVATE) 7.90 % Apply 1 application topically as needed (lichen sclerosus).    . Collagen-Boron-Hyaluronic Acid (MOVE FREE ULTRA JOINT HEALTH) 40-5-3.3 MG TABS Take 1 tablet by mouth daily.    . cyclobenzaprine (FLEXERIL) 10 MG tablet Take 1 tablet (10 mg total) by mouth 3 (three) times daily as needed for muscle spasms. 30 tablet 1  . eletriptan (RELPAX) 40 MG tablet Take 1 tablet (40 mg total) by mouth 2 (two) times daily as needed for  migraine or headache. 10 tablet 5  . furosemide (LASIX) 20 MG tablet Take 0.5 tablets (10 mg total) by mouth daily as needed for edema. 45 tablet 3  . lisinopril (ZESTRIL) 10 MG tablet TAKE 1 TABLET DAILY 90 tablet 0  . Multiple Vitamins-Minerals (BARIATRIC MULTIVITAMINS/IRON PO) Take 1 tablet by mouth daily. BariMelts Multivitamin W/Iron    . multivitamin-lutein (OCUVITE-LUTEIN) CAPS capsule Take 1 capsule by mouth daily.    . hyoscyamine (LEVSIN) 0.125 MG tablet Take 0.125 mg by mouth every 4 (four) hours as needed (abdominal cramps/spasms). (Patient not taking: Reported on 05/04/2020)    . mometasone (ELOCON) 0.1 % ointment Apply 0.1 % topically as needed (lichen sclerosus).     No current facility-administered medications for this visit.    Family History  Problem Relation Age of Onset  . Skin cancer Mother   . Migraines Mother   . High blood pressure Mother   . Heart disease Father   . High blood pressure Father   . High Cholesterol Father   . Breast cancer Maternal Aunt 70  . Breast cancer Paternal Aunt 76  . Migraines Brother     Review of Systems  All other systems reviewed and are negative.   Exam:   BP 118/76 (Cuff Size: Large)   Pulse 72   Ht 5' 3.75" (1.619 m)   Wt 235 lb (106.6 kg)   LMP 10/14/2010   SpO2 99%   BMI 40.65 kg/m   Height: 5' 3.75" (161.9 cm)  General appearance: alert, cooperative and appears stated age Head: Normocephalic, without obvious abnormality, atraumatic Neck: no adenopathy, supple, symmetrical, trachea midline and thyroid normal to inspection and palpation Lungs: clear to auscultation bilaterally Breasts: normal appearance, no masses or tenderness Heart: regular rate and rhythm Abdomen: soft, non-tender; bowel sounds normal; no masses,  no organomegaly Extremities: extremities normal, atraumatic, no cyanosis or edema Skin: Skin color, texture, turgor normal. No rashes or lesions Lymph nodes: Cervical, supraclavicular, and axillary  nodes normal. No abnormal inguinal nodes palpated Neurologic: Grossly normal   Pelvic: External genitalia:  Hypopigmentation of inferior labia majora and perineal body noted              Urethra:  normal appearing urethra with no masses, tenderness or lesions              Bartholins and Skenes: normal                 Vagina: normal appearing vagina with normal color and discharge, no lesions  Cervix: absent              Pap taken: No. Bimanual Exam:  Uterus:  uterus absent              Adnexa: no mass, fullness, tenderness               Rectovaginal: Confirms               Anus:  normal sphincter tone, no lesions  Chaperone, Britt Bottom, CMA, was present for exam.  Assessment/Plan: 1. Well woman exam with routine gynecological exam - pap not indicated - MMG 08/2019 - colonoscopy 10/2019 - BMD will be planned in about 2 years - vaccines reviewed - lab work up to date  2. Lichen sclerosus - pt will use clobetasol 0.05% ointment bid and recheck 1 month - she was not using mometasone for maintenance (as thought she only needed to use prn) so will transition to this once there is improvement  3. Hx of hysterectomy (TLH/BSO)  4. S/P laparoscopic sleeve gastrectomy - has nutrition follow up and follow up with Dr. Redmond Pulling already scheduled  5. Vitamin D deficiency  6. Migraine without status migrainosus, not intractable, unspecified migraine type  7. Decreased cardiac ejection fraction - followed by Dr. Johnsie Cancel

## 2020-05-05 ENCOUNTER — Ambulatory Visit: Payer: BC Managed Care – PPO | Admitting: Orthopaedic Surgery

## 2020-05-05 ENCOUNTER — Encounter: Payer: Self-pay | Admitting: Orthopaedic Surgery

## 2020-05-05 ENCOUNTER — Ambulatory Visit: Payer: Self-pay

## 2020-05-05 VITALS — Ht 66.0 in | Wt 235.0 lb

## 2020-05-05 DIAGNOSIS — M1712 Unilateral primary osteoarthritis, left knee: Secondary | ICD-10-CM

## 2020-05-05 NOTE — Progress Notes (Signed)
Office Visit Note   Patient: Savannah [name]           Date of Birth: November 02, 1960           MRN: [id number] Visit Date: 05/05/2020              Requested by: Lawerance Cruel, [place],  [place] [address] PCP: Lawerance Cruel, MD   Assessment & Plan: Visit Diagnoses:  1. Primary osteoarthritis of left knee     Plan: Impression is end-stage left knee DJD.  She is now a surgical candidate given recent weight loss that has allowed her to achieve a BMI of 37.9.  We will obtain prealbumin today.  Plan is for Xarelto for 4 weeks postoperatively since she cannot take NSAIDs due to gastric sleeve surgery.  We will schedule her surgery as soon as we get the necessary clearances from PCP and cardiologist.  Details of the surgery including risk benefits rehab recovery reviewed with the patient.  Anticipate that she will need to be out of work for up to 12 weeks.  Questions encouraged and answered.  Follow-Up Instructions: Return for post-op.   Orders:  Orders Placed This Encounter  Procedures  . XR KNEE 3 VIEW LEFT  . Prealbumin   No orders of the defined types were placed in this encounter.     Procedures: No procedures performed   Clinical Data: No additional findings.   Subjective: Chief Complaint  Patient presents with  . Left Knee - Pain    Savannah [name] returns today for follow-up of chronic left knee pain due to end-stage DJD.  She recently had a gastric sleeve surgery in December and has been able to lose weight and get her BMI down to 37.9.  She continues to have severe left knee pain that is debilitating and affecting ADLs.  She is unable to walk without severe pain.  We have seen her for several years and provide her with cortisone injections as well as medications and other conservative treatments.   Review of Systems  Constitutional: Negative.   HENT: Negative.   Eyes: Negative.   Respiratory: Negative.   Cardiovascular: Negative.   Endocrine:  Negative.   Musculoskeletal: Negative.   Neurological: Negative.   Hematological: Negative.   Psychiatric/Behavioral: Negative.   All other systems reviewed and are negative.    Objective: Vital Signs: Ht 5\' 6"  (1.676 m)   Wt 235 lb (106.6 kg)   LMP 10/14/2010   BMI 37.93 kg/m   Physical Exam Vitals and nursing note reviewed.  Constitutional:      Appearance: She is well-developed.  Pulmonary:     Effort: Pulmonary effort is normal.  Skin:    General: Skin is warm.     Capillary Refill: Capillary refill takes less than 2 seconds.  Neurological:     Mental Status: She is alert and oriented to person, place, and time.  Psychiatric:        Behavior: Behavior normal.        Thought Content: Thought content normal.        Judgment: Judgment normal.     Ortho Exam Left knee shows no joint effusion.  Moderate pain with passive range of motion.  Range of motion is 0 to 110 degrees.  Collaterals and cruciates are stable.  2+ patellofemoral crepitus. Specialty Comments:  No specialty comments available.  Imaging: XR KNEE 3 VIEW LEFT  Result Date: 05/05/2020 X-rays demonstrate advanced degenerative changes to  the medial patellofemoral compartments    PMFS History: Patient Active Problem List   Diagnosis Date Noted  . Primary osteoarthritis of left knee 05/05/2020  . 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 Fatty liver disease, nonalcoholic 18/84/1660  . 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  . Trigger finger, right middle finger 10/10/2018  . Other insomnia 06/23/2018  . Vitamin D deficiency 03/26/2018  . Prediabetes 03/11/2018  . Obesity 02/26/2018  . Migraines 10/01/2016  . Decreased cardiac ejection fraction 10/01/2016  . Severe obesity ([place]) 10/01/2016  . Shingles 08/03/2016  . Chronic pain of right knee 05/24/2016  . Unilateral primary osteoarthritis, right knee 05/24/2016  . Posterior tibial tendinitis, right leg 01/30/2016  . Lichen  sclerosus 63/02/6008   Past Medical History:  Diagnosis Date  . Arthritis    bilateral knees, recent LCL sprain-on left knee  . CHF (congestive heart failure) ([place])   . Dysrhythmia    trigem  . Environmental allergies   . Family history of adverse reaction to anesthesia    slow to wake up  . Fatty liver   . Headache(784.0)    migraines-on meds for control, relpax, ketoprophen, vicoden, muscle relaxer  . Hearing loss 2016   High frequency hearing - Bilateral   . History of hiatal hernia   . Hot flashes   . Joint pain   . Lichen sclerosus   . Lipoma    Right Knee  . Menorrhagia   . Migraines   . Necrobiosis lipoidica   . Osteoarthritis   . Overweight   . Pelvic pain in female   . PONV (postoperative nausea and vomiting)    ponv, slow to awake  . Seasonal allergies   . Sinus problem   . Sprain 10/25/2010   left knee  . Trigeminal [name]   . Trigger finger, right    Middle finger and right wrist   . Vitamin D deficiency     Family History  Problem Relation Age of Onset  . Skin cancer Mother   . Migraines Mother   . High blood pressure Mother   . Heart disease Father   . High blood pressure Father   . High Cholesterol Father   . Breast cancer Maternal Aunt 70  . Breast cancer Paternal Aunt 12  . Migraines Brother     Past Surgical History:  Procedure Laterality Date  . ABDOMINAL HYSTERECTOMY    . APPENDECTOMY    . DIAGNOSTIC LAPAROSCOPY  08/2006  . DILATION AND CURETTAGE OF UTERUS  2011   attempted ablation  . FUNCTIONAL ENDOSCOPIC SINUS SURGERY    . ganglion cyst removal    . HYSTEROSCOPY     failed  . KNEE ARTHROPLASTY Right 2018   meniscus  . LAPAROSCOPIC GASTRIC BAND REMOVAL WITH LAPAROSCOPIC GASTRIC SLEEVE RESECTION  02/02/2020  . LASIK    . LIPOMA EXCISION Right 07/22/2014   Procedure: EXCISION RIGHT THIGH LIPOMA;  Surgeon: Erroll Luna, MD;  Location: [facility];  Service: General;  Laterality: Right;  . ROBOTIC ASSISTED TOTAL  HYSTERECTOMY  11/06/10   TLH/RSO  . SALPINGOOPHORECTOMY  11/06/2010   Procedure: SALPINGO OOPHERECTOMY;  Surgeon: Felipa Emory;  Location: [facility];  Service: Gynecology;  Laterality: Right;  . TRIGGER FINGER RELEASE    . UPPER GI ENDOSCOPY N/A 02/02/2020   Procedure: UPPER GI ENDOSCOPY;  Surgeon: [name], MD;  Location: [facility];  Service: General;  Laterality: N/A;   Social History   Occupational History  .  Occupation: Administrator, [employer]  Tobacco Use  . Smoking status: Never Smoker  . Smokeless tobacco: Never Used  Vaping Use  . Vaping Use: Never used  Substance and Sexual Activity  . Alcohol use: No    Alcohol/week: 0.0 standard drinks  . Drug use: No  . Sexual activity: Yes    Birth control/protection: Surgical    Comment: TLH/RSO

## 2020-05-05 NOTE — Progress Notes (Signed)
Office Visit Note   Patient: Savannah [name]           Date of Birth: 11/20/1960           MRN: [id number] Visit Date: 05/05/2020              Requested by: Lawerance Cruel, [place],  [place] [address] PCP: Lawerance Cruel, MD   Assessment & Plan: Visit Diagnoses:  1. Chronic pain of left knee     Plan: Impression is advanced degenerative joint disease left knee.  We have discussed various treatment options to include cortisone injection versus total knee replacement.  The patient would like to proceed with total knee replacement at this time.  Risk, benefits possible occasions reviewed.  Rehab recovery time discussed.  All questions were answered.  She does have a decreased ejection fraction so we will go ahead and get cardiac clearance.  Follow-Up Instructions: Return for post-op.   Orders:  Orders Placed This Encounter  Procedures  . XR KNEE 3 VIEW LEFT   No orders of the defined types were placed in this encounter.     Procedures: No procedures performed   Clinical Data: No additional findings.   Subjective: Chief Complaint  Patient presents with  . Left Knee - Pain    HPI patient is a very pleasant 60 year old female who comes in today with chronic left knee pain and history of advanced degenerative joint disease.  She has been dealing with this for almost 9 years after sustaining a fall injuring her left knee.  No previous surgical intervention.  She has had pain to the entire knee but primarily the medial aspect since.  Worse with walking, standing, squatting or kneeling.  She has been taking Tylenol on occasion with mild relief of symptoms.  She is unsure if she has had a cortisone injection to the left knee.  Of note she is 3 months status post bariatric surgery.  Review of Systems as detailed in HPI.  All others reviewed and are negative.   Objective: Vital Signs: Ht 5\' 6"  (1.676 m)   Wt 235 lb (106.6 kg)   LMP 10/14/2010   BMI  37.93 kg/m   Physical Exam well-developed well-nourished female no acute distress.  Alert oriented x3.  Ortho Exam examination of the left knee shows a trace effusion.  Range of motion 0 to 95 degrees.  Medial joint line tenderness.  Moderate patellofemoral crepitus.  Ligaments are stable.  She is neurovascular intact distally.  Specialty Comments:  No specialty comments available.  Imaging: XR KNEE 3 VIEW LEFT  Result Date: 05/05/2020 X-rays demonstrate advanced degenerative changes to the medial patellofemoral compartments    PMFS History: Patient Active Problem List   Diagnosis Date Noted  . 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 Fatty liver disease, nonalcoholic 75/91/6384  . 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  . Trigger finger, right middle finger 10/10/2018  . Other insomnia 06/23/2018  . Vitamin D deficiency 03/26/2018  . Prediabetes 03/11/2018  . Obesity 02/26/2018  . Migraines 10/01/2016  . Decreased cardiac ejection fraction 10/01/2016  . Severe obesity ([place]) 10/01/2016  . Shingles 08/03/2016  . Chronic pain of right knee 05/24/2016  . Unilateral primary osteoarthritis, right knee 05/24/2016  . Posterior tibial tendinitis, right leg 01/30/2016  . Lichen sclerosus 66/59/9357   Past Medical History:  Diagnosis Date  . Arthritis    bilateral knees, recent LCL sprain-on left knee  . CHF (  congestive heart failure) ([place])   . Dysrhythmia    trigem  . Environmental allergies   . Family history of adverse reaction to anesthesia    slow to wake up  . Fatty liver   . Headache(784.0)    migraines-on meds for control, relpax, ketoprophen, vicoden, muscle relaxer  . Hearing loss 2016   High frequency hearing - Bilateral   . History of hiatal hernia   . Hot flashes   . Joint pain   . Lichen sclerosus   . Lipoma    Right Knee  . Menorrhagia   . Migraines   . Necrobiosis lipoidica   . Osteoarthritis   . Overweight   . Pelvic pain in  female   . PONV (postoperative nausea and vomiting)    ponv, slow to awake  . Seasonal allergies   . Sinus problem   . Sprain 10/25/2010   left knee  . Trigeminal pulse   . Trigger finger, right    Middle finger and right wrist   . Vitamin D deficiency     Family History  Problem Relation Age of Onset  . Skin cancer Mother   . Migraines Mother   . High blood pressure Mother   . Heart disease Father   . High blood pressure Father   . High Cholesterol Father   . Breast cancer Maternal Aunt 70  . Breast cancer Paternal Aunt 64  . Migraines Brother     Past Surgical History:  Procedure Laterality Date  . ABDOMINAL HYSTERECTOMY    . APPENDECTOMY    . DIAGNOSTIC LAPAROSCOPY  08/2006  . DILATION AND CURETTAGE OF UTERUS  2011   attempted ablation  . FUNCTIONAL ENDOSCOPIC SINUS SURGERY    . ganglion cyst removal    . HYSTEROSCOPY     failed  . KNEE ARTHROPLASTY Right 2018   meniscus  . LAPAROSCOPIC GASTRIC BAND REMOVAL WITH LAPAROSCOPIC GASTRIC SLEEVE RESECTION  02/02/2020  . LASIK    . LIPOMA EXCISION Right 07/22/2014   Procedure: EXCISION RIGHT THIGH LIPOMA;  Surgeon: Erroll Luna, MD;  Location: [facility];  Service: General;  Laterality: Right;  . ROBOTIC ASSISTED TOTAL HYSTERECTOMY  11/06/10   TLH/RSO  . SALPINGOOPHORECTOMY  11/06/2010   Procedure: SALPINGO OOPHERECTOMY;  Surgeon: Felipa Emory;  Location: [facility];  Service: Gynecology;  Laterality: Right;  . TRIGGER FINGER RELEASE    . UPPER GI ENDOSCOPY N/A 02/02/2020   Procedure: UPPER GI ENDOSCOPY;  Surgeon: [name], MD;  Location: [facility];  Service: General;  Laterality: N/A;   Social History   Occupational History  . Occupation: Administrator, [employer]  Tobacco Use  . Smoking status: Never Smoker  . Smokeless tobacco: Never Used  Vaping Use  . Vaping Use: Never used  Substance and Sexual Activity  . Alcohol use: No    Alcohol/week: 0.0 standard drinks  . Drug use: No  . Sexual activity: Yes     Birth control/protection: Surgical    Comment: TLH/RSO

## 2020-05-06 ENCOUNTER — Telehealth: Payer: Self-pay | Admitting: *Deleted

## 2020-05-06 LAB — PREALBUMIN: Prealbumin: 17 mg/dL (ref 17–34)

## 2020-05-06 LAB — EXTRA LAV TOP TUBE

## 2020-05-06 NOTE — Telephone Encounter (Signed)
   [facility] Pre-operative Risk Assessment    HEARTCARE STAFF: - Please ensure there is not already an duplicate clearance open for this procedure. - Under Visit Info/Reason for Call, type in Other and utilize the format Clearance MM/DD/YY or Clearance TBD. Do not use dashes or single digits. - If request is for dental extraction, please clarify the # of teeth to be extracted.  Request for surgical clearance:  1. What type of surgery is being performed?  LEFT TOTAL KNEE ARTHORPLASTY    2. When is this surgery scheduled?  TBD   3. What type of clearance is required (medical clearance vs. Pharmacy clearance to hold med vs. Both)?  BOTH  4. Are there any medications that need to be held prior to surgery and how long? N/A   5. Practice name and name of physician performing surgery?  [facility] / DR. XU   6. What is the office phone number?  [phone]   7.   What is the office fax number?  [phone]  8.   Anesthesia type (None, local, MAC, general) ?  ANESTHESIA & SPINAL BLOCK   Savannah [name] 05/06/2020, 4:14 PM  _________________________________________________________________   (provider comments below)

## 2020-05-11 ENCOUNTER — Other Ambulatory Visit: Payer: Self-pay

## 2020-05-12 ENCOUNTER — Telehealth: Payer: Self-pay | Admitting: Orthopaedic Surgery

## 2020-05-12 NOTE — Telephone Encounter (Signed)
Patient submitted medical release form, FMLA, and $25.00 cahs payment to [employer]. Accepted 05/12/20

## 2020-05-16 NOTE — Pre-Procedure Instructions (Addendum)
Surgical Instructions    Your procedure is scheduled on Monday, April 11th.  Report to [facility] Main Entrance "A" at 5:30 A.M., then check in with the Admitting office.  Call this number if you have problems the morning of surgery:  [phone]   If you have any questions prior to your surgery date call [phone]: Open Monday-Friday 8am-4pm    Remember:  Do not eat after midnight the night before your surgery  You may drink clear liquids until 4:30 the morning of your surgery.   Clear liquids allowed are: Water, Non-Citrus Juices (without pulp), Carbonated Beverages, Clear Tea, Black Coffee Only, and Gatorade.   Enhanced Recovery after Surgery for Orthopedics Enhanced Recovery after Surgery is a protocol used to improve the stress on your body and your recovery after surgery.  Patient Instructions  . The night before surgery:  o No food after midnight. ONLY clear liquids after midnight   . The day of surgery (if you do NOT have diabetes):  o Drink ONE (1) Pre-Surgery Clear Ensure by 4:15 am the morning of surgery   o This drink was given to you during your hospital  pre-op appointment visit. o Nothing else to drink after completing the  Pre-Surgery Clear Ensure.         If you have questions, please contact your surgeon's office.     Take these medicines the morning of surgery with A SIP OF WATER  carvedilol (COREG)    Take these medicines AS NEEDED the morning of surgery cyclobenzaprine (FLEXERIL) eletriptan (RELPAX) meclizine (ANTIVERT)  promethazine (PHENERGAN)  As of today, STOP taking any Aspirin (unless otherwise instructed by your surgeon) Aleve, Naproxen, Ibuprofen, Motrin, Advil, Goody's, BC's, all herbal medications, fish oil, and all vitamins.                    Do NOT Smoke (Tobacco/Vaping) or drink Alcohol 24 hours prior to your procedure.  If you use a CPAP at night, you may bring all equipment for your overnight stay.   Contacts, glasses,  piercing's, hearing aid's, dentures or partials may not be worn into surgery, please bring cases for these belongings.    For patients admitted to the hospital, discharge time will be determined by your treatment team.   Patients discharged the day of surgery will not be allowed to drive home, and someone needs to stay with them for 24 hours.    Special instructions:   [facility]- Preparing For Surgery  Before surgery, you can play an important role. Because skin is not sterile, your skin needs to be as free of germs as possible. You can reduce the number of germs on your skin by washing with CHG (chlorahexidine gluconate) Soap before surgery.  CHG is an antiseptic cleaner which kills germs and bonds with the skin to continue killing germs even after washing.    Oral Hygiene is also important to reduce your risk of infection.  Remember - BRUSH YOUR TEETH THE MORNING OF SURGERY WITH YOUR REGULAR TOOTHPASTE  Please do not use if you have an allergy to CHG or antibacterial soaps. If your skin becomes reddened/irritated stop using the CHG.  Do not shave (including legs and underarms) for at least 48 hours prior to first CHG shower. It is OK to shave your face.  Please follow these instructions carefully.   1. Shower the NIGHT BEFORE SURGERY and the MORNING OF SURGERY  2. If you chose to wash your hair, wash your hair  first as usual with your normal shampoo.  3. After you shampoo, rinse your hair and body thoroughly to remove the shampoo.  4. Use CHG Soap as you would any other liquid soap. You can apply CHG directly to the skin and wash gently with a scrungie or a clean washcloth.   5. Apply the CHG Soap to your body ONLY FROM THE NECK DOWN.  Do not use on open wounds or open sores. Avoid contact with your eyes, ears, mouth and genitals (private parts). Wash Face and genitals (private parts)  with your normal soap.   6. Wash thoroughly, paying special attention to the area where your  surgery will be performed.  7. Thoroughly rinse your body with warm water from the neck down.  8. DO NOT shower/wash with your normal soap after using and rinsing off the CHG Soap.  9. Pat yourself dry with a CLEAN TOWEL.  10. Wear CLEAN PAJAMAS to bed the night before surgery  11. Place CLEAN SHEETS on your bed the night before your surgery  12. DO NOT SLEEP WITH PETS.   Day of Surgery: Shower with CHG soap. Do not wear jewelry, make up, or nail polish Do not wear lotions, powders, perfumes, or deodorant. Do not shave 48 hours prior to surgery.  Do not bring valuables to the hospital. [facility] is not responsible for any belongings or valuables. Wear Clean/Comfortable clothing the morning of surgery Remember to brush your teeth WITH YOUR REGULAR TOOTHPASTE.   Please read over the following fact sheets that you were given.

## 2020-05-17 ENCOUNTER — Encounter (HOSPITAL_COMMUNITY): Payer: Self-pay

## 2020-05-17 ENCOUNTER — Other Ambulatory Visit: Payer: Self-pay

## 2020-05-17 ENCOUNTER — Encounter (HOSPITAL_COMMUNITY)
Admission: RE | Admit: 2020-05-17 | Discharge: 2020-05-17 | Disposition: A | Discharge status: Home / Self Care | Payer: BC Managed Care – PPO | Source: Ambulatory Visit | Attending: Orthopaedic Surgery | Admitting: Orthopaedic Surgery

## 2020-05-17 DIAGNOSIS — Z01812 Encounter for preprocedural laboratory examination: Secondary | ICD-10-CM | POA: Insufficient documentation

## 2020-05-17 LAB — CBC WITH DIFFERENTIAL/PLATELET
Abs Immature Granulocytes: 0.03 10*3/uL (ref 0.00–0.07)
Basophils Absolute: 0 10*3/uL (ref 0.0–0.1)
Basophils Relative: 0 %
Eosinophils Absolute: 0.3 10*3/uL (ref 0.0–0.5)
Eosinophils Relative: 4 %
HCT: 42.2 % (ref 36.0–46.0)
Hemoglobin: 13.8 g/dL (ref 12.0–15.0)
Immature Granulocytes: 0 %
Lymphocytes Relative: 22 %
Lymphs Abs: 2.1 10*3/uL (ref 0.7–4.0)
MCH: 30.3 pg (ref 26.0–34.0)
MCHC: 32.7 g/dL (ref 30.0–36.0)
MCV: 92.7 fL (ref 80.0–100.0)
Monocytes Absolute: 0.5 10*3/uL (ref 0.1–1.0)
Monocytes Relative: 6 %
Neutro Abs: 6.4 10*3/uL (ref 1.7–7.7)
Neutrophils Relative %: 68 %
Platelets: 232 10*3/uL (ref 150–400)
RBC: 4.55 MIL/uL (ref 3.87–5.11)
RDW: 14.6 % (ref 11.5–15.5)
WBC: 9.4 10*3/uL (ref 4.0–10.5)
nRBC: 0 % (ref 0.0–0.2)

## 2020-05-17 LAB — COMPREHENSIVE METABOLIC PANEL
ALT: 71 U/L — ABNORMAL HIGH (ref 0–44)
AST: 56 U/L — ABNORMAL HIGH (ref 15–41)
Albumin: 3.8 g/dL (ref 3.5–5.0)
Alkaline Phosphatase: 129 U/L — ABNORMAL HIGH (ref 38–126)
Anion gap: 8 (ref 5–15)
BUN: 11 mg/dL (ref 6–20)
CO2: 25 mmol/L (ref 22–32)
Calcium: 9.7 mg/dL (ref 8.9–10.3)
Chloride: 108 mmol/L (ref 98–111)
Creatinine, Ser: 0.87 mg/dL (ref 0.44–1.00)
GFR, Estimated: >60 mL/min (ref >60)
Glucose, Bld: 119 mg/dL — ABNORMAL HIGH (ref 70–99)
Potassium: 3.8 mmol/L (ref 3.5–5.1)
Sodium: 141 mmol/L (ref 135–145)
Total Bilirubin: 0.4 mg/dL (ref 0.3–1.2)
Total Protein: 6.7 g/dL (ref 6.5–8.1)

## 2020-05-17 LAB — URINALYSIS, ROUTINE W REFLEX MICROSCOPIC
Bilirubin Urine: NEGATIVE
Glucose, UA: NEGATIVE mg/dL
Hgb urine dipstick: NEGATIVE
Ketones, ur: NEGATIVE mg/dL
Leukocytes,Ua: NEGATIVE
Nitrite: NEGATIVE
Protein, ur: NEGATIVE mg/dL
Specific Gravity, Urine: 1.002 — ABNORMAL LOW (ref 1.005–1.030)
pH: 7 (ref 5.0–8.0)

## 2020-05-17 LAB — TYPE AND SCREEN
ABO/RH(D): O POS
Antibody Screen: NEGATIVE

## 2020-05-17 LAB — APTT: aPTT: 31 seconds (ref 24–36)

## 2020-05-17 LAB — SURGICAL PCR SCREEN
MRSA, PCR: NEGATIVE
Staphylococcus aureus: NEGATIVE

## 2020-05-17 LAB — PROTIME-INR
INR: 1 (ref 0.8–1.2)
Prothrombin Time: 12.9 seconds (ref 11.4–15.2)

## 2020-05-17 NOTE — Progress Notes (Signed)
PCP - Dr. Lona Kettle Cardiologist - Dr. Johnsie Cancel  Chest x-ray - 10/30/19 EKG - 09/10/19 Stress Test - 09/14/18 ECHO - 09/15/19 Cardiac Cath - denies   Sleep Study - denies CPAP - denies  Blood Thinner Instructions: n/a Aspirin Instructions: n/a  ERAS Protcol -pt to stop clear liquids by 0430 DOS. PRE-SURGERY Ensure or G2- Pt unable to have more than 5g of carbs d/t gastric sleeve and the Ensure contains 50g. Pt offered a 10oz bottle of water instead.   COVID TEST- Scheduled for 05/19/20. Pt aware to quarantine after testing.    Anesthesia review: Yes, hx of HF  Patient denies shortness of breath, fever, cough and chest pain at PAT appointment   All instructions explained to the patient, with a verbal understanding of the material. Patient agrees to go over the instructions while at home for a better understanding. Patient also instructed to self quarantine after being tested for COVID-19. The opportunity to ask questions was provided.

## 2020-05-18 NOTE — Anesthesia Preprocedure Evaluation (Addendum)
Anesthesia Evaluation  Patient identified by MRN, date of birth, ID band Patient awake    Reviewed: Allergy & Precautions, NPO status , Patient's Chart, lab work & pertinent test results  History of Anesthesia Complications (+) PONV  Airway Mallampati: III  TM Distance: >3 FB Neck ROM: Full    Dental  (+) Teeth Intact, Dental Advisory Given   Pulmonary neg pulmonary ROS,    breath sounds clear to auscultation       Cardiovascular +CHF   Rhythm:Regular Rate:Normal     Neuro/Psych  Headaches, negative psych ROS   GI/Hepatic Neg liver ROS, hiatal hernia,   Endo/Other  negative endocrine ROS  Renal/GU negative Renal ROS     Musculoskeletal  (+) Arthritis ,   Abdominal Normal abdominal exam  (+)   Peds  Hematology negative hematology ROS (+)   Anesthesia Other Findings   Reproductive/Obstetrics                            Anesthesia Physical Anesthesia Plan  ASA: II  Anesthesia Plan: Spinal   Post-op Pain Management:  Regional for Post-op pain   Induction: Intravenous  PONV Risk Score and Plan: 4 or greater and Ondansetron, Dexamethasone, Propofol infusion and Midazolam  Airway Management Planned: Natural Airway and Simple Face Mask  Additional Equipment: None  Intra-op Plan:   Post-operative Plan:   Informed Consent: I have reviewed the patients History and Physical, chart, labs and discussed the procedure including the risks, benefits and alternatives for the proposed anesthesia with the patient or authorized representative who has indicated his/her understanding and acceptance.       Plan Discussed with: CRNA  Anesthesia Plan Comments: (PAT note by Karoline Caldwell, PA-C: Follows with cardiology for history of nonischemic cardiomyopathy, last echo August 2021 showed EF 50%, doing well per last visit with Truitt Merle, NP on 03/29/2020.  Preop clearance was also addressed at  that visit.  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  Preop labs reviewed, transaminases very mildly elevated, improved from previous.  Otherwise unremarkable.  EKG 09/10/2019: Sinus tachycardia.  Rate 115.   TTE 09/25/2019: 1. Left ventricular ejection fraction, by estimation, is 50%. The left  ventricle has mildly reduced systolic function. Mild global hypokinesis.  Left ventricular diastolic parameters are consistent with Grade I  diastolic dysfunction (impaired  relaxation). GLS -23.2%, normal.  2. Right ventricular systolic function is normal. The right ventricular  size is normal. Tricuspid regurgitation signal is inadequate for assessing  PA pressure.  3. The aortic valve is tricuspid. Aortic valve regurgitation is not  visualized. No aortic stenosis is present.  4. The mitral valve is normal in structure. No evidence of mitral valve  regurgitation. No evidence of mitral stenosis.  5. The inferior vena cava is normal in size with greater than 50%  respiratory variability, suggesting right atrial pressure of 3 mmHg.   Comparison(s): 09/10/17 EF 45-50%.   Nuclear stress 09/15/2018: EF is 51 %. Visually, the EF appears to be greater than 51% This is a low risk study. There is no evidence of ischemia or infarction . The study is normal.   Coronary CT 06/22/2015: IMPRESSION: Coronary calcium score of 0. )      Anesthesia Quick Evaluation

## 2020-05-18 NOTE — Progress Notes (Signed)
Anesthesia Chart Review:  Follows with cardiology for history of nonischemic cardiomyopathy, last echo August 2021 showed EF 50%, doing well per last visit with Truitt Merle, NP on 03/29/2020.  Preop clearance was also addressed at that visit.  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  Preop labs reviewed, transaminases very mildly elevated, improved from previous.  Otherwise unremarkable.  EKG 09/10/2019: Sinus tachycardia.  Rate 115.   TTE 09/25/2019: 1. Left ventricular ejection fraction, by estimation, is 50%. The left  ventricle has mildly reduced systolic function. Mild global hypokinesis.  Left ventricular diastolic parameters are consistent with Grade I  diastolic dysfunction (impaired  relaxation). GLS -23.2%, normal.  2. Right ventricular systolic function is normal. The right ventricular  size is normal. Tricuspid regurgitation signal is inadequate for assessing  PA pressure.  3. The aortic valve is tricuspid. Aortic valve regurgitation is not  visualized. No aortic stenosis is present.  4. The mitral valve is normal in structure. No evidence of mitral valve  regurgitation. No evidence of mitral stenosis.  5. The inferior vena cava is normal in size with greater than 50%  respiratory variability, suggesting right atrial pressure of 3 mmHg.   Comparison(s): 09/10/17 EF 45-50%.   Nuclear stress 09/15/2018:  EF is 51 %. Visually, the EF appears to be greater than 51%  This is a low risk study. There is no evidence of ischemia or infarction .  The study is normal.   Coronary CT 06/22/2015: IMPRESSION: Coronary calcium score of 0.   Karoline Caldwell, PA-C [facility] Short Stay Center/Anesthesiology Phone [phone] 05/18/2020 2:48 PM

## 2020-05-19 ENCOUNTER — Telehealth: Payer: Self-pay

## 2020-05-19 ENCOUNTER — Other Ambulatory Visit (HOSPITAL_COMMUNITY)
Admission: RE | Admit: 2020-05-19 | Discharge: 2020-05-19 | Disposition: A | Discharge status: Home / Self Care | Payer: BC Managed Care – PPO | Source: Ambulatory Visit | Attending: Orthopaedic Surgery | Admitting: Orthopaedic Surgery

## 2020-05-19 ENCOUNTER — Other Ambulatory Visit: Payer: Self-pay | Admitting: Physician Assistant

## 2020-05-19 DIAGNOSIS — Z01812 Encounter for preprocedural laboratory examination: Secondary | ICD-10-CM | POA: Insufficient documentation

## 2020-05-19 DIAGNOSIS — Z20822 Contact with and (suspected) exposure to covid-19: Secondary | ICD-10-CM | POA: Insufficient documentation

## 2020-05-19 LAB — SARS CORONAVIRUS 2 (TAT 6-24 HRS): SARS Coronavirus 2: NEGATIVE

## 2020-05-19 MED ORDER — ONDANSETRON HCL 4 MG PO TABS: 4.0000 mg | ORAL_TABLET | Freq: Three times a day (TID) | ORAL | 0 refills | Status: DC | PRN

## 2020-05-19 MED ORDER — DOCUSATE SODIUM 100 MG PO CAPS: 100.0000 mg | ORAL_CAPSULE | Freq: Every day | ORAL | 2 refills | Status: DC | PRN

## 2020-05-19 MED ORDER — OXYCODONE-ACETAMINOPHEN 5-325 MG PO TABS: 1.0000 | ORAL_TABLET | Freq: Four times a day (QID) | ORAL | 0 refills | Status: DC | PRN

## 2020-05-19 MED ORDER — METHOCARBAMOL 500 MG PO TABS: 500.0000 mg | ORAL_TABLET | Freq: Two times a day (BID) | ORAL | 0 refills | Status: DC | PRN

## 2020-05-19 MED ORDER — RIVAROXABAN 10 MG PO TABS
10.0000 mg | ORAL_TABLET | Freq: Every day | ORAL | 0 refills | Status: DC
Start: 2020-05-19 — End: 2020-08-17

## 2020-05-19 NOTE — Telephone Encounter (Signed)
Patient called for advice about what vitamins to start and stop prior to her knee surgery. Ubaldo Glassing is out of office. Message emailed to Bariatric Coordinator.

## 2020-05-20 MED ORDER — TRANEXAMIC ACID 1000 MG/10ML IV SOLN
2000.0000 mg | INTRAVENOUS | Status: DC
  Filled 2020-05-20 (×2): qty 20

## 2020-05-23 ENCOUNTER — Ambulatory Visit (HOSPITAL_COMMUNITY): Payer: BC Managed Care – PPO

## 2020-05-23 ENCOUNTER — Encounter (HOSPITAL_COMMUNITY)
Admission: RE | Disposition: A | Discharge status: Home / Self Care | Payer: Self-pay | Source: Home / Self Care | Attending: Orthopaedic Surgery

## 2020-05-23 ENCOUNTER — Ambulatory Visit (HOSPITAL_COMMUNITY): Payer: BC Managed Care – PPO | Admitting: Physician Assistant

## 2020-05-23 ENCOUNTER — Encounter (HOSPITAL_COMMUNITY): Payer: Self-pay | Admitting: Orthopaedic Surgery

## 2020-05-23 ENCOUNTER — Other Ambulatory Visit: Payer: Self-pay

## 2020-05-23 ENCOUNTER — Ambulatory Visit (HOSPITAL_COMMUNITY): Payer: BC Managed Care – PPO | Admitting: Certified Registered Nurse Anesthetist

## 2020-05-23 ENCOUNTER — Observation Stay (HOSPITAL_COMMUNITY): Payer: BC Managed Care – PPO

## 2020-05-23 ENCOUNTER — Observation Stay (HOSPITAL_COMMUNITY)
Admission: RE | Admit: 2020-05-23 | Discharge: 2020-05-24 | Disposition: A | Discharge status: Home / Self Care | Payer: BC Managed Care – PPO | Attending: Orthopaedic Surgery | Admitting: Orthopaedic Surgery

## 2020-05-23 DIAGNOSIS — Z79899 Other long term (current) drug therapy: Secondary | ICD-10-CM | POA: Diagnosis not present

## 2020-05-23 DIAGNOSIS — R52 Pain, unspecified: Secondary | ICD-10-CM

## 2020-05-23 DIAGNOSIS — E663 Overweight: Secondary | ICD-10-CM | POA: Insufficient documentation

## 2020-05-23 DIAGNOSIS — M1712 Unilateral primary osteoarthritis, left knee: Principal | ICD-10-CM | POA: Diagnosis present

## 2020-05-23 DIAGNOSIS — Z96652 Presence of left artificial knee joint: Secondary | ICD-10-CM

## 2020-05-23 DIAGNOSIS — E559 Vitamin D deficiency, unspecified: Secondary | ICD-10-CM | POA: Diagnosis not present

## 2020-05-23 DIAGNOSIS — I499 Cardiac arrhythmia, unspecified: Secondary | ICD-10-CM | POA: Diagnosis not present

## 2020-05-23 DIAGNOSIS — Z7901 Long term (current) use of anticoagulants: Secondary | ICD-10-CM | POA: Insufficient documentation

## 2020-05-23 DIAGNOSIS — I509 Heart failure, unspecified: Secondary | ICD-10-CM | POA: Insufficient documentation

## 2020-05-23 HISTORY — PX: TOTAL KNEE ARTHROPLASTY: SHX125

## 2020-05-23 SURGERY — ARTHROPLASTY, KNEE, TOTAL
Anesthesia: Spinal | Site: Knee | Laterality: Left

## 2020-05-23 MED ORDER — ACETAMINOPHEN 325 MG PO TABS: 325.0000 mg | ORAL_TABLET | Freq: Four times a day (QID) | ORAL | Status: DC | PRN

## 2020-05-23 MED ORDER — PROPOFOL 500 MG/50ML IV EMUL
INTRAVENOUS | Status: DC | PRN
  Administered 2020-05-23: 50 ug/kg/min via INTRAVENOUS

## 2020-05-23 MED ORDER — OXYCODONE HCL 5 MG PO TABS: 5.0000 mg | ORAL_TABLET | ORAL | Status: DC | PRN

## 2020-05-23 MED ORDER — MAGNESIUM CITRATE PO SOLN: 1.0000 | Freq: Once | ORAL | Status: DC | PRN

## 2020-05-23 MED ORDER — OXYCODONE HCL ER 10 MG PO T12A
10.0000 mg | EXTENDED_RELEASE_TABLET | Freq: Two times a day (BID) | ORAL | Status: DC
Start: 2020-05-23 — End: 2020-05-24
  Administered 2020-05-23 – 2020-05-24 (×3): 10 mg via ORAL
  Filled 2020-05-23 (×3): qty 1

## 2020-05-23 MED ORDER — DOCUSATE SODIUM 100 MG PO CAPS
100.0000 mg | ORAL_CAPSULE | Freq: Two times a day (BID) | ORAL | Status: DC
  Administered 2020-05-23 – 2020-05-24 (×3): 100 mg via ORAL
  Filled 2020-05-23 (×3): qty 1

## 2020-05-23 MED ORDER — METHOCARBAMOL 500 MG PO TABS
ORAL_TABLET | ORAL | Status: AC
  Filled 2020-05-23: qty 1

## 2020-05-23 MED ORDER — BUPIVACAINE HCL 0.25 % IJ SOLN
INTRAMUSCULAR | Status: DC | PRN
  Administered 2020-05-23: 20 mL

## 2020-05-23 MED ORDER — ONDANSETRON HCL 4 MG/2ML IJ SOLN: 4.0000 mg | Freq: Four times a day (QID) | INTRAMUSCULAR | Status: DC | PRN

## 2020-05-23 MED ORDER — SORBITOL 70 % SOLN
30.0000 mL | Freq: Every day | Status: DC | PRN
  Filled 2020-05-23: qty 30

## 2020-05-23 MED ORDER — VANCOMYCIN HCL 1000 MG IV SOLR
INTRAVENOUS | Status: DC | PRN
  Administered 2020-05-23: 1000 mg

## 2020-05-23 MED ORDER — FENTANYL CITRATE (PF) 250 MCG/5ML IJ SOLN
INTRAMUSCULAR | Status: AC
  Filled 2020-05-23: qty 5

## 2020-05-23 MED ORDER — SODIUM CHLORIDE 0.9 % IV SOLN: INTRAVENOUS | Status: DC

## 2020-05-23 MED ORDER — CHLORHEXIDINE GLUCONATE 0.12 % MT SOLN
OROMUCOSAL | Status: AC
  Administered 2020-05-23: 15 mL via OROMUCOSAL
  Filled 2020-05-23: qty 15

## 2020-05-23 MED ORDER — FENTANYL CITRATE (PF) 250 MCG/5ML IJ SOLN
INTRAMUSCULAR | Status: DC | PRN
  Administered 2020-05-23: 50 ug via INTRAVENOUS

## 2020-05-23 MED ORDER — PHENYLEPHRINE HCL-NACL 10-0.9 MG/250ML-% IV SOLN
INTRAVENOUS | Status: DC | PRN
  Administered 2020-05-23: 30 ug/min via INTRAVENOUS

## 2020-05-23 MED ORDER — IRRISEPT - 450ML BOTTLE WITH 0.05% CHG IN STERILE WATER, USP 99.95% OPTIME
TOPICAL | Status: DC | PRN
  Administered 2020-05-23: 450 mL

## 2020-05-23 MED ORDER — VANCOMYCIN HCL 1000 MG IV SOLR
INTRAVENOUS | Status: AC
  Filled 2020-05-23: qty 1000

## 2020-05-23 MED ORDER — BUPIVACAINE IN DEXTROSE 0.75-8.25 % IT SOLN
INTRATHECAL | Status: DC | PRN
  Administered 2020-05-23: 1.6 mL via INTRATHECAL

## 2020-05-23 MED ORDER — LACTATED RINGERS IV SOLN: INTRAVENOUS | Status: DC

## 2020-05-23 MED ORDER — ACETAMINOPHEN 160 MG/5ML PO SOLN: 325.0000 mg | Freq: Once | ORAL | Status: DC | PRN

## 2020-05-23 MED ORDER — CHLORHEXIDINE GLUCONATE 0.12 % MT SOLN: 15.0000 mL | Freq: Once | OROMUCOSAL | Status: AC

## 2020-05-23 MED ORDER — BUPIVACAINE-MELOXICAM ER 200-6 MG/7ML IJ SOLN
INTRAMUSCULAR | Status: AC
  Filled 2020-05-23: qty 2

## 2020-05-23 MED ORDER — MENTHOL 3 MG MT LOZG: 1.0000 | LOZENGE | OROMUCOSAL | Status: DC | PRN

## 2020-05-23 MED ORDER — MIDAZOLAM HCL 2 MG/2ML IJ SOLN
INTRAMUSCULAR | Status: DC | PRN
  Administered 2020-05-23: 2 mg via INTRAVENOUS

## 2020-05-23 MED ORDER — ACETAMINOPHEN 10 MG/ML IV SOLN
INTRAVENOUS | Status: AC
  Filled 2020-05-23: qty 100

## 2020-05-23 MED ORDER — METOCLOPRAMIDE HCL 5 MG/ML IJ SOLN: 5.0000 mg | Freq: Three times a day (TID) | INTRAMUSCULAR | Status: DC | PRN

## 2020-05-23 MED ORDER — ALUM & MAG HYDROXIDE-SIMETH 200-200-20 MG/5ML PO SUSP: 30.0000 mL | ORAL | Status: DC | PRN

## 2020-05-23 MED ORDER — CEFAZOLIN SODIUM-DEXTROSE 2-4 GM/100ML-% IV SOLN
2.0000 g | INTRAVENOUS | Status: AC
  Administered 2020-05-23: 2 g via INTRAVENOUS
  Filled 2020-05-23: qty 100

## 2020-05-23 MED ORDER — POLYETHYLENE GLYCOL 3350 17 G PO PACK
17.0000 g | PACK | Freq: Every day | ORAL | Status: DC
  Administered 2020-05-23 – 2020-05-24 (×2): 17 g via ORAL
  Filled 2020-05-23 (×2): qty 1

## 2020-05-23 MED ORDER — ACETAMINOPHEN 500 MG PO TABS
1000.0000 mg | ORAL_TABLET | Freq: Four times a day (QID) | ORAL | Status: AC
  Administered 2020-05-23 – 2020-05-24 (×4): 1000 mg via ORAL
  Filled 2020-05-23 (×4): qty 2

## 2020-05-23 MED ORDER — PHENYLEPHRINE 40 MCG/ML (10ML) SYRINGE FOR IV PUSH (FOR BLOOD PRESSURE SUPPORT)
PREFILLED_SYRINGE | INTRAVENOUS | Status: DC | PRN
  Administered 2020-05-23 (×2): 120 ug via INTRAVENOUS
  Administered 2020-05-23: 80 ug via INTRAVENOUS

## 2020-05-23 MED ORDER — CARVEDILOL 6.25 MG PO TABS
6.2500 mg | ORAL_TABLET | Freq: Two times a day (BID) | ORAL | Status: DC
  Administered 2020-05-23 – 2020-05-24 (×2): 6.25 mg via ORAL
  Filled 2020-05-23 (×2): qty 1

## 2020-05-23 MED ORDER — TRANEXAMIC ACID 1000 MG/10ML IV SOLN
INTRAVENOUS | Status: DC | PRN
  Administered 2020-05-23: 2000 mg via TOPICAL

## 2020-05-23 MED ORDER — SODIUM CHLORIDE 0.9 % IR SOLN
Status: DC | PRN
  Administered 2020-05-23: 3000 mL

## 2020-05-23 MED ORDER — POVIDONE-IODINE 10 % EX SWAB
2.0000 "application " | Freq: Once | CUTANEOUS | Status: AC
  Administered 2020-05-23: 2 via TOPICAL

## 2020-05-23 MED ORDER — DIPHENHYDRAMINE HCL 12.5 MG/5ML PO ELIX
25.0000 mg | ORAL_SOLUTION | ORAL | Status: DC | PRN
  Filled 2020-05-23: qty 10

## 2020-05-23 MED ORDER — MEPERIDINE HCL 25 MG/ML IJ SOLN: 6.2500 mg | INTRAMUSCULAR | Status: DC | PRN

## 2020-05-23 MED ORDER — HYDROMORPHONE HCL 1 MG/ML IJ SOLN: 0.2500 mg | INTRAMUSCULAR | Status: DC | PRN

## 2020-05-23 MED ORDER — ONDANSETRON HCL 4 MG PO TABS: 4.0000 mg | ORAL_TABLET | Freq: Four times a day (QID) | ORAL | Status: DC | PRN

## 2020-05-23 MED ORDER — METOCLOPRAMIDE HCL 5 MG PO TABS: 5.0000 mg | ORAL_TABLET | Freq: Three times a day (TID) | ORAL | Status: DC | PRN

## 2020-05-23 MED ORDER — PANTOPRAZOLE SODIUM 40 MG PO TBEC
40.0000 mg | DELAYED_RELEASE_TABLET | Freq: Every day | ORAL | Status: DC
  Administered 2020-05-23 – 2020-05-24 (×2): 40 mg via ORAL
  Filled 2020-05-23 (×2): qty 1

## 2020-05-23 MED ORDER — MIDAZOLAM HCL 2 MG/2ML IJ SOLN
INTRAMUSCULAR | Status: AC
  Filled 2020-05-23: qty 2

## 2020-05-23 MED ORDER — TRANEXAMIC ACID-NACL 1000-0.7 MG/100ML-% IV SOLN
1000.0000 mg | Freq: Once | INTRAVENOUS | Status: AC
  Administered 2020-05-23: 1000 mg via INTRAVENOUS
  Filled 2020-05-23: qty 100

## 2020-05-23 MED ORDER — ORAL CARE MOUTH RINSE: 15.0000 mL | Freq: Once | OROMUCOSAL | Status: AC

## 2020-05-23 MED ORDER — DEXAMETHASONE SODIUM PHOSPHATE 10 MG/ML IJ SOLN
INTRAMUSCULAR | Status: DC | PRN
  Administered 2020-05-23: 5 mg via INTRAVENOUS

## 2020-05-23 MED ORDER — PHENOL 1.4 % MT LIQD: 1.0000 | OROMUCOSAL | Status: DC | PRN

## 2020-05-23 MED ORDER — BUPIVACAINE-MELOXICAM ER 400-12 MG/14ML IJ SOLN
INTRAMUSCULAR | Status: DC | PRN
  Administered 2020-05-23: 300 mg

## 2020-05-23 MED ORDER — CEFAZOLIN SODIUM-DEXTROSE 2-4 GM/100ML-% IV SOLN
2.0000 g | Freq: Four times a day (QID) | INTRAVENOUS | Status: AC
  Administered 2020-05-23 (×2): 2 g via INTRAVENOUS
  Filled 2020-05-23 (×2): qty 100

## 2020-05-23 MED ORDER — METHOCARBAMOL 500 MG PO TABS
500.0000 mg | ORAL_TABLET | Freq: Four times a day (QID) | ORAL | Status: DC | PRN
  Administered 2020-05-23 – 2020-05-24 (×2): 500 mg via ORAL
  Filled 2020-05-23: qty 1

## 2020-05-23 MED ORDER — ACETAMINOPHEN 325 MG PO TABS
325.0000 mg | ORAL_TABLET | Freq: Once | ORAL | Status: DC | PRN
Start: 2020-05-23 — End: 2020-05-23

## 2020-05-23 MED ORDER — HYDROMORPHONE HCL 1 MG/ML IJ SOLN
0.5000 mg | INTRAMUSCULAR | Status: DC | PRN
  Administered 2020-05-23: 1 mg via INTRAVENOUS
  Filled 2020-05-23: qty 1

## 2020-05-23 MED ORDER — METHOCARBAMOL 1000 MG/10ML IJ SOLN
500.0000 mg | Freq: Four times a day (QID) | INTRAVENOUS | Status: DC | PRN
  Filled 2020-05-23: qty 5

## 2020-05-23 MED ORDER — ONDANSETRON HCL 4 MG/2ML IJ SOLN
INTRAMUSCULAR | Status: DC | PRN
  Administered 2020-05-23: 4 mg via INTRAVENOUS

## 2020-05-23 MED ORDER — TRANEXAMIC ACID-NACL 1000-0.7 MG/100ML-% IV SOLN
1000.0000 mg | INTRAVENOUS | Status: AC
  Administered 2020-05-23: 1000 mg via INTRAVENOUS
  Filled 2020-05-23: qty 100

## 2020-05-23 MED ORDER — OXYCODONE HCL 5 MG PO TABS: 10.0000 mg | ORAL_TABLET | ORAL | Status: DC | PRN

## 2020-05-23 MED ORDER — 0.9 % SODIUM CHLORIDE (POUR BTL) OPTIME
TOPICAL | Status: DC | PRN
  Administered 2020-05-23: 1000 mL

## 2020-05-23 MED ORDER — ACETAMINOPHEN 10 MG/ML IV SOLN
1000.0000 mg | Freq: Once | INTRAVENOUS | Status: DC | PRN
  Administered 2020-05-23: 1000 mg via INTRAVENOUS

## 2020-05-23 MED ORDER — RIVAROXABAN 10 MG PO TABS
10.0000 mg | ORAL_TABLET | Freq: Every day | ORAL | Status: DC
  Administered 2020-05-24: 10 mg via ORAL
  Filled 2020-05-23: qty 1

## 2020-05-23 SURGICAL SUPPLY — 83 items
ADH SKN CLS APL DERMABOND .7 (GAUZE/BANDAGES/DRESSINGS) ×1
ALCOHOL 70% 16 OZ (MISCELLANEOUS) ×2 IMPLANT
BAG DECANTER FOR FLEXI CONT (MISCELLANEOUS) ×2 IMPLANT
BANDAGE ESMARK 6X9 LF (GAUZE/BANDAGES/DRESSINGS) IMPLANT
BLADE SAG 18X100X1.27 (BLADE) ×2 IMPLANT
BNDG CMPR 9X6 STRL LF SNTH (GAUZE/BANDAGES/DRESSINGS)
BNDG ESMARK 6X9 LF (GAUZE/BANDAGES/DRESSINGS)
BOWL SMART MIX CTS (DISPOSABLE) ×2 IMPLANT
BSPLAT TIB 5D E CMNT STM LT (Knees) ×1 IMPLANT
CEMENT BONE REFOBACIN R1X40 US (Cement) ×2 IMPLANT
CLSR STERI-STRIP ANTIMIC 1/2X4 (GAUZE/BANDAGES/DRESSINGS) ×4 IMPLANT
COOLER ICEMAN CLASSIC (MISCELLANEOUS) ×2 IMPLANT
COVER SURGICAL LIGHT HANDLE (MISCELLANEOUS) ×2 IMPLANT
COVER WAND RF STERILE (DRAPES) IMPLANT
CUFF TOURN SGL QUICK 34 (TOURNIQUET CUFF) ×2
CUFF TOURN SGL QUICK 42 (TOURNIQUET CUFF) IMPLANT
CUFF TRNQT CYL 34X4.125X (TOURNIQUET CUFF) ×1 IMPLANT
DERMABOND ADVANCED (GAUZE/BANDAGES/DRESSINGS) ×1
DERMABOND ADVANCED .7 DNX12 (GAUZE/BANDAGES/DRESSINGS) ×1 IMPLANT
DRAPE EXTREMITY T 121X128X90 (DISPOSABLE) ×2 IMPLANT
DRAPE HALF SHEET 40X57 (DRAPES) ×2 IMPLANT
DRAPE INCISE IOBAN 66X45 STRL (DRAPES) IMPLANT
DRAPE ORTHO SPLIT 77X108 STRL (DRAPES) ×4
DRAPE POUCH INSTRU U-SHP 10X18 (DRAPES) ×2 IMPLANT
DRAPE SURG ORHT 6 SPLT 77X108 (DRAPES) ×2 IMPLANT
DRAPE U-SHAPE 47X51 STRL (DRAPES) ×4 IMPLANT
DRSG AQUACEL AG ADV 3.5X10 (GAUZE/BANDAGES/DRESSINGS) ×2 IMPLANT
DRSG AQUACEL AG ADV 3.5X14 (GAUZE/BANDAGES/DRESSINGS) ×1 IMPLANT
DURAPREP 26ML APPLICATOR (WOUND CARE) ×6 IMPLANT
ELECT CAUTERY BLADE 6.4 (BLADE) ×2 IMPLANT
ELECT REM PT RETURN 9FT ADLT (ELECTROSURGICAL) ×2
ELECTRODE REM PT RTRN 9FT ADLT (ELECTROSURGICAL) ×1 IMPLANT
FEMORAL KNEE COMP SZ 8 STND LT (Knees) ×2 IMPLANT
FEMORAL KNEE COMP SZ 8STD LT (Knees) IMPLANT
GLOVE ECLIPSE 7.0 STRL STRAW (GLOVE) ×6 IMPLANT
GLOVE SKINSENSE NS SZ7.5 (GLOVE) ×3
GLOVE SKINSENSE STRL SZ7.5 (GLOVE) ×3 IMPLANT
GLOVE SURG SYN 7.5  E (GLOVE) ×8
GLOVE SURG SYN 7.5 E (GLOVE) ×4 IMPLANT
GLOVE SURG SYN 7.5 PF PI (GLOVE) ×4 IMPLANT
GLOVE SURG UNDER POLY LF SZ7 (GLOVE) ×2 IMPLANT
GOWN STRL REIN XL XLG (GOWN DISPOSABLE) ×2 IMPLANT
GOWN STRL REUS W/ TWL LRG LVL3 (GOWN DISPOSABLE) ×1 IMPLANT
GOWN STRL REUS W/TWL LRG LVL3 (GOWN DISPOSABLE) ×2
HANDPIECE INTERPULSE COAX TIP (DISPOSABLE) ×2
HDLS TROCR DRIL PIN KNEE 75 (PIN) ×2
HOOD PEEL AWAY FLYTE STAYCOOL (MISCELLANEOUS) ×4 IMPLANT
JET LAVAGE IRRISEPT WOUND (IRRIGATION / IRRIGATOR) ×2
KIT BASIN OR (CUSTOM PROCEDURE TRAY) ×2 IMPLANT
KIT TURNOVER KIT B (KITS) ×2 IMPLANT
LAVAGE JET IRRISEPT WOUND (IRRIGATION / IRRIGATOR) ×1 IMPLANT
MANIFOLD NEPTUNE II (INSTRUMENTS) ×2 IMPLANT
MARKER SKIN DUAL TIP RULER LAB (MISCELLANEOUS) ×2 IMPLANT
NDL SPNL 18GX3.5 QUINCKE PK (NEEDLE) ×2 IMPLANT
NEEDLE SPNL 18GX3.5 QUINCKE PK (NEEDLE) ×4 IMPLANT
NS IRRIG 1000ML POUR BTL (IV SOLUTION) ×2 IMPLANT
PACK TOTAL JOINT (CUSTOM PROCEDURE TRAY) ×2 IMPLANT
PAD ARMBOARD 7.5X6 YLW CONV (MISCELLANEOUS) ×4 IMPLANT
PAD COLD SHLDR WRAP-ON (PAD) ×2 IMPLANT
PIN DRILL HDLS TROCAR 75 4PK (PIN) IMPLANT
SAW OSC TIP CART 19.5X105X1.3 (SAW) ×2 IMPLANT
SCREW FEMALE HEX FIX 25X2.5 (ORTHOPEDIC DISPOSABLE SUPPLIES) ×1 IMPLANT
SET HNDPC FAN SPRY TIP SCT (DISPOSABLE) ×1 IMPLANT
STAPLER VISISTAT 35W (STAPLE) IMPLANT
STEM POLY PAT PLY 32M KNEE (Knees) ×1 IMPLANT
STEM TIBIA 5 DEG SZ E L KNEE (Knees) IMPLANT
STEM TIBIAL 10 8-11 EF POLY LT (Joint) ×1 IMPLANT
SUCTION FRAZIER HANDLE 10FR (MISCELLANEOUS) ×2
SUCTION TUBE FRAZIER 10FR DISP (MISCELLANEOUS) ×1 IMPLANT
SUT ETHILON 2 0 FS 18 (SUTURE) IMPLANT
SUT MNCRL AB 4-0 PS2 18 (SUTURE) IMPLANT
SUT VIC AB 0 CT1 27 (SUTURE) ×4
SUT VIC AB 0 CT1 27XBRD ANBCTR (SUTURE) ×2 IMPLANT
SUT VIC AB 1 CTX 27 (SUTURE) ×6 IMPLANT
SUT VIC AB 2-0 CT1 27 (SUTURE) ×8
SUT VIC AB 2-0 CT1 TAPERPNT 27 (SUTURE) ×4 IMPLANT
SYR 50ML LL SCALE MARK (SYRINGE) ×4 IMPLANT
TIBIA STEM 5 DEG SZ E L KNEE (Knees) ×2 IMPLANT
TOWEL GREEN STERILE (TOWEL DISPOSABLE) ×2 IMPLANT
TOWEL GREEN STERILE FF (TOWEL DISPOSABLE) ×2 IMPLANT
TRAY CATH 16FR W/PLASTIC CATH (SET/KITS/TRAYS/PACK) IMPLANT
UNDERPAD 30X36 HEAVY ABSORB (UNDERPADS AND DIAPERS) ×2 IMPLANT
WRAP KNEE MAXI GEL POST OP (GAUZE/BANDAGES/DRESSINGS) ×1 IMPLANT

## 2020-05-23 NOTE — Evaluation (Signed)
Physical Therapy Evaluation Patient Details Name: Savannah [name] MRN: [id number] DOB: [birth year] Today's Date: 05/23/2020   History of Present Illness  60 yo female s/p L TKR on 4/11. 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  Clinical Impression  Pt presents with L post-operative pain and weakness, impaired mobility, decreased L knee ROM, impaired gait, and decreased activity tolerance vs baseline. Pt to benefit from acute PT to address deficits. Pt ambulated short hallway distance with use of RW, overall requiring min assist as well as cues for form/safety. Pt educated on ankle pumps (20/hour) to perform this afternoon/evening to increase circulation, to pt's tolerance and limited by pain. PT to progress mobility as tolerated, and will continue to follow acutely.        Follow Up Recommendations Follow surgeon's recommendation for DC plan and follow-up therapies;Supervision for mobility/OOB (HHPT)    Equipment Recommendations  3in1 (PT)    Recommendations for Other Services       Precautions / Restrictions Precautions Precautions: Fall Restrictions Weight Bearing Restrictions: No      Mobility  Bed Mobility Overal bed mobility: Needs Assistance Bed Mobility: Supine to Sit     Supine to sit: Min assist     General bed mobility comments: min assist for slow lowering of LLE over EOB. Verbal cuing for sequencing task and scooting to EOB.    Transfers Overall transfer level: Needs assistance Equipment used: Rolling walker (2 wheeled) Transfers: Sit to/from Stand Sit to Stand: Min assist;From elevated surface         General transfer comment: min assist for power up, rise, steady. Verbal cuing for hand placement when rising.  Ambulation/Gait Ambulation/Gait assistance: Min assist Gait Distance (Feet): 40 Feet Assistive device: Rolling walker (2 wheeled) Gait Pattern/deviations: Step-to pattern;Decreased step length - left;Trunk  flexed;Antalgic;Decreased weight shift to left Gait velocity: decr   General Gait Details: min assist to steady, intermittently block LLE. Verbal cuing for sequencing, placement in Rw.  Stairs            Wheelchair Mobility    Modified Rankin (Stroke Patients Only)       Balance Overall balance assessment: Needs assistance Sitting-balance support: No upper extremity supported;Feet supported Sitting balance-Leahy Scale: Good     Standing balance support: Bilateral upper extremity supported;During functional activity Standing balance-Leahy Scale: Poor                               Pertinent Vitals/Pain Pain Assessment: 0-10 Pain Score: 4  Pain Location: L knee Pain Descriptors / Indicators: Sore;Discomfort Pain Intervention(s): Limited activity within patient's tolerance;Monitored during session    Home Living Family/patient expects to be discharged to:: Private residence Living Arrangements: Spouse/significant other Available Help at Discharge: Family;Available 24 hours/day (husband works from home) Type of Home: House Home Access: Stairs to enter   Technical brewer of Steps: 1 Home Layout: Able to live on main level with bedroom/bathroom Home Equipment: Walker - 2 wheels;Cane - single point;Cane - quad      Prior Function Level of Independence: Independent         Comments: pt reports not using AD PTA     Hand Dominance   Dominant Hand: Right    Extremity/Trunk Assessment   Upper Extremity Assessment Upper Extremity Assessment: Defer to OT evaluation    Lower Extremity Assessment Lower Extremity Assessment: LLE deficits/detail LLE Deficits / Details: anticipated post-operative weakness; able to  perform ankle pump, quad set, heel slide to 60*    Cervical / Trunk Assessment Cervical / Trunk Assessment: Normal  Communication   Communication: No difficulties  Cognition Arousal/Alertness: Awake/alert Behavior During Therapy:  WFL for tasks assessed/performed Overall Cognitive Status: Within Functional Limits for tasks assessed                                        General Comments      Exercises General Exercises - Lower Extremity Ankle Circles/Pumps: AROM;Both;10 reps;Seated Quad Sets: AROM;Left;5 reps;Seated Heel Slides: AAROM;Left;Supine (x2)   Assessment/Plan    PT Assessment Patient needs continued PT services  PT Problem List Decreased strength;Decreased mobility;Decreased range of motion;Decreased activity tolerance;Decreased balance;Decreased knowledge of use of DME;Pain       PT Treatment Interventions DME instruction;Therapeutic activities;Gait training;Therapeutic exercise;Patient/family education;Stair training;Balance training;Functional mobility training;Neuromuscular re-education    PT Goals (Current goals can be found in the Care Plan section)  Acute Rehab PT Goals Patient Stated Goal: walk normally PT Goal Formulation: With patient Time For Goal Achievement: 06/06/20 Potential to Achieve Goals: Good    Frequency 7X/week   Barriers to discharge        Co-evaluation               AM-PAC PT "6 Clicks" Mobility  Outcome Measure Help needed turning from your back to your side while in a flat bed without using bedrails?: A Little Help needed moving from lying on your back to sitting on the side of a flat bed without using bedrails?: A Little Help needed moving to and from a bed to a chair (including a wheelchair)?: A Little Help needed standing up from a chair using your arms (e.g., wheelchair or bedside chair)?: A Little Help needed to walk in hospital room?: A Little Help needed climbing 3-5 steps with a railing? : A Little 6 Click Score: 18    End of Session Equipment Utilized During Treatment: Gait belt Activity Tolerance: Patient tolerated treatment well;Patient limited by fatigue Patient left: in chair;with call bell/phone within reach;with  family/visitor present Nurse Communication: Mobility status PT Visit Diagnosis: Other abnormalities of gait and mobility (R26.89);Difficulty in walking, not elsewhere classified (R26.2)    Time: 3704-8889 PT Time Calculation (min) (ACUTE ONLY): 35 min   Charges:   PT Evaluation $PT Eval Low Complexity: 1 Low PT Treatments $Gait Training: 8-22 mins        Stacie Glaze, PT Acute Rehabilitation Services Pager [phone]  Office [phone]  Louis Matte 05/23/2020, 5:57 PM

## 2020-05-23 NOTE — Anesthesia Procedure Notes (Signed)
Procedure Name: MAC Date/Time: 05/23/2020 7:35 AM Performed by: Dorthea Cove, CRNA Pre-anesthesia Checklist: Patient identified, Emergency Drugs available, Suction available, Patient being monitored and Timeout performed Patient Re-evaluated:Patient Re-evaluated prior to induction Oxygen Delivery Method: Nasal cannula Preoxygenation: Pre-oxygenation with 100% oxygen Induction Type: IV induction Placement Confirmation: positive ETCO2 and CO2 detector Dental Injury: Teeth and Oropharynx as per pre-operative assessment

## 2020-05-23 NOTE — Anesthesia Postprocedure Evaluation (Signed)
Anesthesia Post Note  Patient: Savannah [name]  Procedure(s) Performed: LEFT TOTAL KNEE ARTHROPLASTY (Left Knee)     Patient location during evaluation: PACU Anesthesia Type: Spinal Level of consciousness: oriented and awake and alert Pain management: pain level controlled Vital Signs Assessment: post-procedure vital signs reviewed and stable Respiratory status: spontaneous breathing, respiratory function stable and patient connected to nasal cannula oxygen Cardiovascular status: blood pressure returned to baseline and stable Postop Assessment: no headache, no backache and no apparent nausea or vomiting Anesthetic complications: no   No complications documented.  Last Vitals:  Vitals:   05/23/20 1022 05/23/20 1046  BP: 129/76 111/84  Pulse: 76 (!) 59  Resp: 14 18  Temp: (!) 36.1 C 36.6 C  SpO2: 100% 98%    Last Pain:  Vitals:   05/23/20 1129  TempSrc:   PainSc: Savannah [name]

## 2020-05-23 NOTE — Op Note (Signed)
Total Knee Arthroplasty Procedure Note  Preoperative diagnosis: Left knee osteoarthritis  Postoperative diagnosis:same  Operative procedure: Left total knee arthroplasty. CPT [phone]  Surgeon: N. Eduard Roux, MD  Assist: Madalyn Rob, PA-C; necessary for the timely completion of procedure and due to complexity of procedure.  Anesthesia: Spinal, regional, local  Tourniquet time: see anesthesia record  Implants used: Zimmer persona Femur: CR 8 Tibia: E Patella: 32 mm Polyethylene: 10 mm, MC  Indication: Savannah [name] is a 60 y.o. year old female with a history of knee pain. Having failed conservative management, the patient elected to proceed with a total knee arthroplasty.  We have reviewed the risk and benefits of the surgery and they elected to proceed after voicing understanding.  Procedure:  After informed consent was obtained and understanding of the risk were voiced including but not limited to bleeding, infection, damage to surrounding structures including nerves and vessels, blood clots, leg length inequality and the failure to achieve desired results, the operative extremity was marked with verbal confirmation of the patient in the holding area.   The patient was then brought to the operating room and transported to the operating room table in the supine position.  A tourniquet was applied to the operative extremity around the upper thigh. The operative limb was then prepped and draped in the usual sterile fashion and preoperative antibiotics were administered.  A time out was performed prior to the start of surgery confirming the correct extremity, preoperative antibiotic administration, as well as team members, implants and instruments available for the case. Correct surgical site was also confirmed with preoperative radiographs. The limb was then elevated for exsanguination and the tourniquet was inflated. A midline incision was made and a standard medial  parapatellar approach was performed.  The infrapatellar fat pad was removed.  Suprapatellar synovium was removed to reveal the anterior distal femoral cortex.  A medial peel was performed to release the capsule of the medial tibial plateau.  The patella was then everted and was prepared and sized to a 32 mm.  A cover was placed on the patella for protection from retractors.  The knee was then brought into full flexion and we then turned our attention to the femur.  The cruciates were sacrificed.  Start site was drilled in the femur and the intramedullary distal femoral cutting guide was placed, set at 5 degrees valgus, taking 10 mm of distal resection. The distal cut was made. Osteophytes were then removed.  Next, the proximal tibial cutting guide was placed with appropriate slope, varus/valgus alignment and depth of resection. The proximal tibial cut was made. Gap blocks were then used to assess the extension gap and alignment, and appropriate soft tissue releases were performed. Attention was turned back to the femur, which was sized using the sizing guide to a size 8. Appropriate rotation of the femoral component was determined using epicondylar axis, Whiteside's line, and assessing the flexion gap under ligament tension. The appropriate size 4-in-1 cutting block was placed and checked with an angel wing and cuts were made. Posterior femoral osteophytes and uncapped bone were then removed with the curved osteotome.  Trial components were placed, and stability was checked in full extension, mid-flexion, and deep flexion. Proper tibial rotation was determined and marked.  The patella tracked well without a lateral release.  The femoral lugs were then drilled. Trial components were then removed and tibial preparation performed.  The tibia was sized for a size E component.   The bony  surfaces were irrigated with a pulse lavage and then dried. Bone cement was vacuum mixed on the back table, and the final components  sized above were cemented into place.  Antibiotic irrigation was placed in the knee joint and soft tissues while the cement cured.  After cement had finished curing, excess cement was removed. The stability of the construct was re-evaluated throughout a range of motion and found to be acceptable. The trial liner was removed, the knee was copiously irrigated, and the knee was re-evaluated for any excess bone debris. The real polyethylene liner, 10 mm thick, was inserted and checked to ensure the locking mechanism had engaged appropriately. The tourniquet was deflated and hemostasis was achieved. The wound was irrigated with normal saline.  One gram of vancomycin powder was placed in the surgical bed.  Capsular closure was performed with a #1 vicryl, subcutaneous fat closed with a 0 vicryl suture, then subcutaneous tissue closed with interrupted 2.0 vicryl suture. The skin was then closed with a 2.0 nylon and dermabond. A sterile dressing was applied.  The patient was awakened in the operating room and taken to recovery in stable condition. All sponge, needle, and instrument counts were correct at the end of the case.  Savannah [name] was necessary for opening, closing, retracting, limb positioning and overall facilitation and completion of the surgery.  Position: supine  Complications: none.  Time Out: performed   Drains/Packing: none  Estimated blood loss: minimal  Returned to Recovery Room: in good condition.   Antibiotics: yes   Mechanical VTE (DVT) Prophylaxis: sequential compression devices, TED thigh-high  Chemical VTE (DVT) Prophylaxis: xarelto  Fluid Replacement  Crystalloid: see anesthesia record Blood: none  FFP: none   Specimens Removed: 1 to pathology   Sponge and Instrument Count Correct? yes   PACU: portable radiograph - knee AP and Lateral   Plan/RTC: Return in 2 weeks for wound check.   Weight Bearing/Load Lower Extremity: full   Implant Name Type Inv. Item Serial  No. Manufacturer Lot No. LRB No. Used Action  CEMENT BONE REFOBACIN [id number] [place] - [id number] Cement CEMENT BONE REFOBACIN [id number] [place]  ZIMMER RECON(ORTH,TRAU,BIO,SG) [id number] Left 2 Implanted  PERSONA 2.5 mm FEMALE HEX SCREW Screw   ZIMMER KNEE [id number] Left 1 Implanted  FEMORAL KNEE COMP SZ 8 STND LT - [id number] Knees FEMORAL KNEE COMP SZ 8 STND LT  ZIMMER RECON(ORTH,TRAU,BIO,SG) [id number] Left 1 Implanted  TIBIA STEM 5 DEG SZ E L KNEE - [id number] Knees TIBIA STEM 5 DEG SZ E L KNEE  ZIMMER RECON(ORTH,TRAU,BIO,SG) [id number] Left 1 Implanted  STEM POLY PAT PLY 12M KNEE - [id number] Knees STEM POLY PAT PLY 12M KNEE  ZIMMER RECON(ORTH,TRAU,BIO,SG) [id number] Left 1 Implanted  STEM TIBIAL 10 8-11 EF POLY LT - [id number] Joint STEM TIBIAL 10 8-11 EF POLY LT  ZIMMER RECON(ORTH,TRAU,BIO,SG) [id number] Left 1 Implanted    N. Eduard Roux, MD [facility] 9:08 AM

## 2020-05-23 NOTE — Telephone Encounter (Signed)
   Patient Name: Savannah [name]  DOB: 1960/07/01  MRN: [id number]   Primary Cardiologist: Jenkins Rouge, MD  Chart reviewed as part of pre-operative protocol coverage. Given past medical history and time since last visit, based on ACC/AHA guidelines, Savannah [name] would be at acceptable risk for the planned procedure without further cardiovascular testing. Patient was last seen 03/28/20 by Truitt Merle, NP by televisit. Patient was stable from a cardiac perspective at that time and note states "would be acceptable risk from our standpoint as long as current situation remains stable/unchaged." She was not on any blood thinners.  It appears left TKA was performed today, 4/11. It is unclear if the patient spoke to someone from the cardiology office prior to procedure, but it appears from prior anesthesia/surgeons notes, there was no significant change in her state of health. She was scheduled for procedure. Also she had a recent sleeve gastrectomy and cholecystectomy 12/21 and this was well tolerated, and since lost 40lbs.  I will route this recommendation to the requesting party via Epic fax function and remove from pre-op pool.  Please call with questions.  Kehinde Totzke Ninfa Meeker, PA-C 05/23/2020, 11:06 AM

## 2020-05-23 NOTE — Discharge Instructions (Signed)
Information on my medicine - XARELTO (Rivaroxaban)  This medication education was reviewed with me or my healthcare representative as part of my discharge preparation.  The pharmacist that spoke with me during my hospital stay was:  Horton Finer, Student-PharmD  Why was Xarelto prescribed for you? Xarelto was prescribed for you to reduce the risk of blood clots forming after orthopedic surgery. The medical term for these abnormal blood clots is venous thromboembolism (VTE).  What do you need to know about xarelto ? Take your Xarelto ONCE DAILY at the same time every day. You may take it either with or without food.  If you have difficulty swallowing the tablet whole, you may crush it and mix in applesauce just prior to taking your dose.  Take Xarelto exactly as prescribed by your doctor and DO NOT stop taking Xarelto without talking to the doctor who prescribed the medication.  Stopping without other VTE prevention medication to take the place of Xarelto may increase your risk of developing a clot.  After discharge, you should have regular check-up appointments with your healthcare provider that is prescribing your Xarelto.    What do you do if you miss a dose? If you miss a dose, take it as soon as you remember on the same day then continue your regularly scheduled once daily regimen the next day. Do not take two doses of Xarelto on the same day.   Important Safety Information A possible side effect of Xarelto is bleeding. You should call your healthcare provider right away if you experience any of the following: ? Bleeding from an injury or your nose that does not stop. ? Unusual colored urine (red or dark brown) or unusual colored stools (red or black). ? Unusual bruising for unknown reasons. ? A serious fall or if you hit your head (even if there is no bleeding).  Some medicines may interact with Xarelto and might increase your risk of bleeding while on Xarelto. To help  avoid this, consult your healthcare provider or pharmacist prior to using any new prescription or non-prescription medications, including herbals, vitamins, non-steroidal anti-inflammatory drugs (NSAIDs) and supplements.  This website has more information on Xarelto: [email or web address]  INSTRUCTIONS AFTER JOINT REPLACEMENT   o Remove items at home which could result in a fall. This includes throw rugs or furniture in walking pathways o ICE to the affected joint every three hours while awake for 30 minutes at a time, for at least the first 3-5 days, and then as needed for pain and swelling.  Continue to use ice for pain and swelling. You may notice swelling that will progress down to the foot and ankle.  This is normal after surgery.  Elevate your leg when you are not up walking on it.   o Continue to use the breathing machine you got in the hospital (incentive spirometer) which will help keep your temperature down.  It is common for your temperature to cycle up and down following surgery, especially at night when you are not up moving around and exerting yourself.  The breathing machine keeps your lungs expanded and your temperature down.   DIET:  As you were doing prior to hospitalization, we recommend a well-balanced diet.  DRESSING / WOUND CARE / SHOWERING  Keep the surgical dressing until follow up.  The dressing is water proof, so you can shower without any extra covering.  IF THE DRESSING FALLS OFF or the wound gets wet inside, change the dressing with sterile gauze.  Please  use good hand washing techniques before changing the dressing.  Do not use any lotions or creams on the incision until instructed by your surgeon.    ACTIVITY  o Increase activity slowly as tolerated, but follow the weight bearing instructions below.   o No driving for 6 weeks or until further direction given by your physician.  You cannot drive while taking narcotics.  o No lifting or carrying greater than 10 lbs.  until further directed by your surgeon. o Avoid periods of inactivity such as sitting longer than an hour when not asleep. This helps prevent blood clots.  o You may return to work once you are authorized by your doctor.     WEIGHT BEARING   Weight bearing as tolerated with assist device (walker, cane, etc) as directed, use it as long as suggested by your surgeon or therapist, typically at least 4-6 weeks.   EXERCISES  Results after joint replacement surgery are often greatly improved when you follow the exercise, range of motion and muscle strengthening exercises prescribed by your doctor. Safety measures are also important to protect the joint from further injury. Any time any of these exercises cause you to have increased pain or swelling, decrease what you are doing until you are comfortable again and then slowly increase them. If you have problems or questions, call your caregiver or physical therapist for advice.   Rehabilitation is important following a joint replacement. After just a few days of immobilization, the muscles of the leg can become weakened and shrink (atrophy).  These exercises are designed to build up the tone and strength of the thigh and leg muscles and to improve motion. Often times heat used for twenty to thirty minutes before working out will loosen up your tissues and help with improving the range of motion but do not use heat for the first two weeks following surgery (sometimes heat can increase post-operative swelling).   These exercises can be done on a training (exercise) mat, on the floor, on a table or on a bed. Use whatever works the best and is most comfortable for you.    Use music or television while you are exercising so that the exercises are a pleasant break in your day. This will make your life better with the exercises acting as a break in your routine that you can look forward to.   Perform all exercises about fifteen times, three times per day or as  directed.  You should exercise both the operative leg and the other leg as well.  Exercises include:   . Quad Sets - Tighten up the muscle on the front of the thigh (Quad) and hold for 5-10 seconds.   . Straight Leg Raises - With your knee straight (if you were given a brace, keep it on), lift the leg to 60 degrees, hold for 3 seconds, and slowly lower the leg.  Perform this exercise against resistance later as your leg gets stronger.  . Leg Slides: Lying on your back, slowly slide your foot toward your buttocks, bending your knee up off the floor (only go as far as is comfortable). Then slowly slide your foot back down until your leg is flat on the floor again.  Glenard Haring Wings: Lying on your back spread your legs to the side as far apart as you can without causing discomfort.  . Hamstring Strength:  Lying on your back, push your heel against the floor with your leg straight by tightening up the muscles  of your buttocks.  Repeat, but this time bend your knee to a comfortable angle, and push your heel against the floor.  You may put a pillow under the heel to make it more comfortable if necessary.   A rehabilitation program following joint replacement surgery can speed recovery and prevent re-injury in the future due to weakened muscles. Contact your doctor or a physical therapist for more information on knee rehabilitation.    CONSTIPATION  Constipation is defined medically as fewer than three stools per week and severe constipation as less than one stool per week.  Even if you have a regular bowel pattern at home, your normal regimen is likely to be disrupted due to multiple reasons following surgery.  Combination of anesthesia, postoperative narcotics, change in appetite and fluid intake all can affect your bowels.   YOU MUST use at least one of the following options; they are listed in order of increasing strength to get the job done.  They are all available over the counter, and you may need to  use some, POSSIBLY even all of these options:    Drink plenty of fluids (prune juice may be helpful) and high fiber foods Colace 100 mg by mouth twice a day  Senokot for constipation as directed and as needed Dulcolax (bisacodyl), take with full glass of water  Miralax (polyethylene glycol) once or twice a day as needed.  If you have tried all these things and are unable to have a bowel movement in the first 3-4 days after surgery call either your surgeon or your primary doctor.    If you experience loose stools or diarrhea, hold the medications until you stool forms back up.  If your symptoms do not get better within 1 week or if they get worse, check with your doctor.  If you experience "the worst abdominal pain ever" or develop nausea or vomiting, please contact the office immediately for further recommendations for treatment.   ITCHING:  If you experience itching with your medications, try taking only a single pain pill, or even half a pain pill at a time.  You can also use Benadryl over the counter for itching or also to help with sleep.   TED HOSE STOCKINGS:  Use stockings on both legs until for at least 2 weeks or as directed by physician office. They may be removed at night for sleeping.  MEDICATIONS:  See your medication summary on the "After Visit Summary" that nursing will review with you.  You may have some home medications which will be placed on hold until you complete the course of blood thinner medication.  It is important for you to complete the blood thinner medication as prescribed.  PRECAUTIONS:  If you experience chest pain or shortness of breath - call 911 immediately for transfer to the hospital emergency department.   If you develop a fever greater that 101 F, purulent drainage from wound, increased redness or drainage from wound, foul odor from the wound/dressing, or calf pain - CONTACT YOUR SURGEON.                                                   FOLLOW-UP  APPOINTMENTS:  If you do not already have a post-op appointment, please call the office for an appointment to be seen by your surgeon.  Guidelines for how soon  to be seen are listed in your "After Visit Summary", but are typically between 1-4 weeks after surgery.  OTHER INSTRUCTIONS:   Knee Replacement:  Do not place pillow under knee, focus on keeping the knee straight while resting. CPM instructions: 0-90 degrees, 2 hours in the morning, 2 hours in the afternoon, and 2 hours in the evening. Place foam block, curve side up under heel at all times except when in CPM or when walking.  DO NOT modify, tear, cut, or change the foam block in any way.  POST-OPERATIVE OPIOID TAPER INSTRUCTIONS: . It is important to wean off of your opioid medication as soon as possible. If you do not need pain medication after your surgery it is ok to stop day one. [name] Opioids include: o Codeine, Hydrocodone(Norco, Vicodin), Oxycodone(Percocet, oxycontin) and hydromorphone amongst others.  . Long term and even short term use of opiods can cause: o Increased pain response o Dependence o Constipation o Depression o Respiratory depression o And more.  . Withdrawal symptoms can include o Flu like symptoms o Nausea, vomiting o And more . Techniques to manage these symptoms o Hydrate well o Eat regular healthy meals o Stay active o Use relaxation techniques(deep breathing, meditating, yoga) . Do Not substitute Alcohol to help with tapering . If you have been on opioids for less than two weeks and do not have pain than it is ok to stop all together.  . Plan to wean off of opioids o This plan should start within one week post op of your joint replacement. o Maintain the same interval or time between taking each dose and first decrease the dose.  o Cut the total daily intake of opioids by one tablet each day o Next start to increase the time between doses. o The last dose that should be eliminated is the evening dose.      MAKE SURE YOU:  . Understand these instructions.  . Get help right away if you are not doing well or get worse.   Dental Antibiotics:  In most cases prophylactic antibiotics for Dental procdeures after total joint surgery are not necessary.  Exceptions are as follows:  1. History of prior total joint infection  2. Severely immunocompromised (Organ Transplant, cancer chemotherapy, Rheumatoid biologic meds such as [place])  3. Poorly controlled diabetes (A1C &gt; 8.0, blood glucose over 200)  If you have one of these conditions, contact your surgeon for an antibiotic prescription, prior to your dental procedure.  Thank you for letting us be a part of your medical care team.  It is a privilege we respect greatly.  We hope these instructions will help you stay on track for a fast and full recovery!

## 2020-05-23 NOTE — Anesthesia Procedure Notes (Signed)
Anesthesia Regional Block: Adductor canal block   Pre-Anesthetic Checklist: ,, timeout performed, Correct Patient, Correct Site, Correct Laterality, Correct Procedure, Correct Position, site marked, Risks and benefits discussed,  Surgical consent,  Pre-op evaluation,  At surgeon's request and post-op pain management  Laterality: Left  Prep: chloraprep       Needles:  Injection technique: Single-shot  Needle Type: Echogenic Stimulator Needle     Needle Length: 9cm  Needle Gauge: 21     Additional Needles:   Procedures:,,,, ultrasound used (permanent image in chart),,,,  Narrative:  Start time: 05/23/2020 7:00 AM End time: 05/23/2020 7:05 AM Injection made incrementally with aspirations every 5 mL.  Performed by: Personally  Anesthesiologist: Effie Berkshire, MD  Additional Notes: Patient tolerated the procedure well. Local anesthetic introduced in an incremental fashion under minimal resistance after negative aspirations. No paresthesias were elicited. After completion of the procedure, no acute issues were identified and patient continued to be monitored by RN.

## 2020-05-23 NOTE — Anesthesia Procedure Notes (Signed)
Spinal  Start time: 05/23/2020 7:27 AM End time: 05/23/2020 7:29 AM Reason for block: surgical anesthesia Staffing Performed: anesthesiologist  Anesthesiologist: Effie Berkshire, MD Preanesthetic Checklist Completed: patient identified, IV checked, site marked, risks and benefits discussed, surgical consent, monitors and equipment checked, pre-op evaluation and timeout performed Spinal Block Patient position: sitting Prep: DuraPrep and site prepped and draped Location: L3-4 Injection technique: single-shot Needle Needle type: Pencan  Needle gauge: 24 G Needle length: 10 cm Needle insertion depth: 10 cm Additional Notes Patient tolerated well. No immediate complications.

## 2020-05-23 NOTE — Transfer of Care (Signed)
Immediate Anesthesia Transfer of Care Note  Patient: Savannah [name]  Procedure(s) Performed: LEFT TOTAL KNEE ARTHROPLASTY (Left Knee)  Patient Location: PACU  Anesthesia Type:MAC and Spinal  Level of Consciousness: awake, alert  and oriented  Airway & Oxygen Therapy: Patient Spontanous Breathing and Patient connected to nasal cannula oxygen  Post-op Assessment: Report given to RN and Post -op Vital signs reviewed and stable  Post vital signs: Reviewed and stable  Last Vitals:  Vitals Value Taken Time  BP 107/74   Temp    Pulse    Resp 12   SpO2 100     Last Pain:  Vitals:   05/23/20 0605  TempSrc: Oral  PainSc:          Complications: No complications documented.

## 2020-05-23 NOTE — H&P (Signed)
PREOPERATIVE H&P  Chief Complaint: primary localized osteoarthritis left knee  HPI: Savannah [name] is a 60 y.o. female who presents for surgical treatment of primary localized osteoarthritis left knee.  She denies any changes in medical history.  Past Medical History:  Diagnosis Date  . Arthritis    bilateral knees, recent LCL sprain-on left knee  . CHF (congestive heart failure) ([place])   . Dysrhythmia    trigem  . Environmental allergies   . Family history of adverse reaction to anesthesia    slow to wake up  . Fatty liver   . Headache(784.0)    migraines-on meds for control, relpax, ketoprophen, vicoden, muscle relaxer  . Hearing loss 2016   High frequency hearing - Bilateral   . History of hiatal hernia   . Hot flashes   . Joint pain   . Lichen sclerosus   . Lipoma    Right Knee  . Menorrhagia   . Migraines   . Necrobiosis lipoidica   . Osteoarthritis   . Overweight   . Pelvic pain in female   . PONV (postoperative nausea and vomiting)    ponv, slow to awake  . Seasonal allergies   . Sinus problem   . Sprain 10/25/2010   left knee  . Trigeminal pulse   . Trigger finger, right    Middle finger and right wrist   . Vitamin D deficiency    Past Surgical History:  Procedure Laterality Date  . ABDOMINAL HYSTERECTOMY    . APPENDECTOMY    . CHOLECYSTECTOMY    . DIAGNOSTIC LAPAROSCOPY  08/2006  . DILATION AND CURETTAGE OF UTERUS  2011   attempted ablation  . FUNCTIONAL ENDOSCOPIC SINUS SURGERY    . ganglion cyst removal    . HYSTEROSCOPY     failed  . KNEE ARTHROPLASTY Right 2018   meniscus  . LAPAROSCOPIC GASTRIC BAND REMOVAL WITH LAPAROSCOPIC GASTRIC SLEEVE RESECTION  02/02/2020   pt denies gastric band removal. Had gastric sleeve resection and gallbladder removal.  . LASIK    . LIPOMA EXCISION Right 07/22/2014   Procedure: EXCISION RIGHT THIGH LIPOMA;  Surgeon: Erroll Luna, MD;  Location: [facility];  Service: General;  Laterality:  Right;  . ROBOTIC ASSISTED TOTAL HYSTERECTOMY  11/06/10   TLH/RSO  . SALPINGOOPHORECTOMY  11/06/2010   Procedure: SALPINGO OOPHERECTOMY;  Surgeon: Felipa Emory;  Location: [facility];  Service: Gynecology;  Laterality: Right;  . TRIGGER FINGER RELEASE    . UPPER GI ENDOSCOPY N/A 02/02/2020   Procedure: UPPER GI ENDOSCOPY;  Surgeon: [name], MD;  Location: [facility];  Service: General;  Laterality: N/A;   Social History   Socioeconomic History  . Marital status: Married    Spouse name: Paeton Latouche  . Number of children: 0  . Years of education: 25  . Highest education level: Not on file  Occupational History  . Occupation: Administrator, [employer]  Tobacco Use  . Smoking status: Never Smoker  . Smokeless tobacco: Never Used  Vaping Use  . Vaping Use: Never used  Substance and Sexual Activity  . Alcohol use: No    Alcohol/week: 0.0 standard drinks  . Drug use: No  . Sexual activity: Yes    Birth control/protection: Surgical    Comment: TLH/RSO  Other Topics Concern  . Not on file  Social History Narrative   Lives w/ husband, Eddie Dibbles   Caffeine use: Diet coke girl   Right handed    Social Determinants  of Health   Financial Resource Strain: Not on file  Food Insecurity: No Food Insecurity  . Worried About Charity fundraiser in the Last Year: Never true  . Ran Out of Food in the Last Year: Never true  Transportation Needs: Not on file  Physical Activity: Not on file  Stress: Not on file  Social Connections: Not on file   Family History  Problem Relation Age of Onset  . Skin cancer Mother   . Migraines Mother   . High blood pressure Mother   . Heart disease Father   . High blood pressure Father   . High Cholesterol Father   . Breast cancer Maternal Aunt 70  . Breast cancer Paternal Aunt 59  . Migraines Brother    Allergies  Allergen Reactions  . Dust Mite Extract Other (See Comments)    Environmental allergies  . Nsaids Other (See Comments)    avoided due to  bariatric surgery    Prior to Admission medications   Medication Sig Start Date End Date Taking? Authorizing Provider  Biotin 10000 MCG TBDP Take 10,000 mcg by mouth in the morning.   Yes [provider]  Ca Phosphate-Cholecalciferol (CALCIUM 500 + D3) 250-500 MG-UNIT CHEW Chew 1 tablet by mouth in the morning, at noon, and at bedtime. Bariatric Calcium Citrate chewy   Yes [provider]  carvedilol (COREG) 6.25 MG tablet Take 1 tablet (6.25 mg total) by mouth 2 (two) times daily with a meal. 09/10/19  Yes Josue Hector, MD  clobetasol cream (TEMOVATE) 9.56 % Apply 1 application topically as needed (lichen sclerosus). Patient taking differently: Apply 1 application topically in the morning and at bedtime. 05/04/20  Yes Megan Salon, MD  Collagen-Boron-Hyaluronic Acid (MOVE FREE ULTRA JOINT HEALTH) 40-5-3.3 MG TABS Take 1 tablet by mouth in the morning.   Yes [provider]  cyclobenzaprine (FLEXERIL) 10 MG tablet Take 1 tablet (10 mg total) by mouth 3 (three) times daily as needed for muscle spasms. 04/20/19  Yes Megan Salon, MD  docusate sodium (COLACE) 100 MG capsule Take 1 capsule (100 mg total) by mouth daily as needed. 05/19/20 05/19/21 Yes Aundra Dubin, PA-C  eletriptan (RELPAX) 40 MG tablet Take 1 tablet (40 mg total) by mouth 2 (two) times daily as needed for migraine or headache. 09/01/19  Yes Suzzanne Cloud, NP  furosemide (LASIX) 20 MG tablet Take 0.5 tablets (10 mg total) by mouth daily as needed for edema. 09/10/19  Yes Josue Hector, MD  hyoscyamine (LEVSIN) 0.125 MG tablet Take 0.125 mg by mouth every 4 (four) hours as needed (abdominal cramps/spasms).   Yes [provider]  lisinopril (ZESTRIL) 10 MG tablet TAKE 1 TABLET DAILY Patient taking differently: Take 10 mg by mouth in the morning. 05/02/20  Yes Josue Hector, MD  meclizine (ANTIVERT) 25 MG tablet Take 25 mg by mouth 4 (four) times daily as needed for dizziness.   Yes [provider]  methocarbamol (ROBAXIN) 500 MG tablet Take 1 tablet (500 mg total) by mouth 2 (two) times daily as needed. To be taken post-op 05/19/20  Yes Dwana Melena L, PA-C  mometasone (ELOCON) 0.1 % ointment Use small topically twice weekly for maintenance. Patient taking differently: Apply 1 application topically See admin instructions. Use small topically twice weekly as needed for irritation 05/04/20  Yes Megan Salon, MD  Multiple Vitamins-Minerals (BARIATRIC MULTIVITAMINS/IRON PO) Take 1 tablet by mouth in the morning. BariMelts Multivitamin W/Iron (  chewable)   Yes [provider]  multivitamin-lutein (OCUVITE-LUTEIN) CAPS capsule Take 1 capsule by mouth in the morning.   Yes [provider]  ondansetron (ZOFRAN) 4 MG tablet Take 1 tablet (4 mg total) by mouth every 8 (eight) hours as needed for nausea or vomiting. 05/19/20  Yes Aundra Dubin, PA-C  oxyCODONE-acetaminophen (PERCOCET) 5-325 MG tablet Take 1-2 tablets by mouth every 6 (six) hours as needed. To be taken post-op 05/19/20  Yes Aundra Dubin, PA-C  promethazine (PHENERGAN) 25 MG tablet Take 25 mg by mouth every 6 (six) hours as needed for nausea or vomiting.   Yes [provider]  rivaroxaban (XARELTO) 10 MG TABS tablet Take 1 tablet (10 mg total) by mouth daily. To be taken post-op for dvt prophylaxis 05/19/20   Aundra Dubin, PA-C  enoxaparin (LOVENOX) 40 MG/0.4ML injection Inject 0.4 mLs (40 mg total) into the skin every 12 (twelve) hours for 28 days. 02/03/20 05/04/20  [name], MD  gabapentin (NEURONTIN) 100 MG capsule Take 2 capsules (200 mg total) by mouth every 12 (twelve) hours. 02/03/20 03/28/20  [name], MD  pantoprazole (PROTONIX) 40 MG tablet Take 1 tablet (40 mg total) by mouth daily. 02/03/20 05/04/20  [name], MD     Positive ROS: All other systems have been reviewed and were otherwise negative with the exception of those mentioned in the HPI and as above.  Physical  Exam: General: Alert, no acute distress Cardiovascular: No pedal edema Respiratory: No cyanosis, no use of accessory musculature GI: abdomen soft Skin: No lesions in the area of chief complaint Neurologic: Sensation intact distally Psychiatric: Patient is competent for consent with normal mood and affect Lymphatic: no lymphedema  MUSCULOSKELETAL: exam stable  Assessment: primary localized osteoarthritis left knee  Plan: Plan for Procedure(s): LEFT TOTAL KNEE ARTHROPLASTY  The risks benefits and alternatives were discussed with the patient including but not limited to the risks of nonoperative treatment, versus surgical intervention including infection, bleeding, nerve injury,  blood clots, cardiopulmonary complications, morbidity, mortality, among others, and they were willing to proceed.   Preoperative templating of the joint replacement has been completed, documented, and submitted to the Operating Room personnel in order to optimize intra-operative equipment management.   Eduard Roux, MD 05/23/2020 6:04 AM

## 2020-05-24 ENCOUNTER — Telehealth: Payer: Self-pay | Admitting: Skilled Nursing Facility1

## 2020-05-24 DIAGNOSIS — M1712 Unilateral primary osteoarthritis, left knee: Secondary | ICD-10-CM | POA: Diagnosis not present

## 2020-05-24 LAB — BASIC METABOLIC PANEL
Anion gap: 10 (ref 5–15)
BUN: 8 mg/dL (ref 6–20)
CO2: 24 mmol/L (ref 22–32)
Calcium: 9.5 mg/dL (ref 8.9–10.3)
Chloride: 106 mmol/L (ref 98–111)
Creatinine, Ser: 0.89 mg/dL (ref 0.44–1.00)
GFR, Estimated: >60 mL/min (ref >60)
Glucose, Bld: 87 mg/dL (ref 70–99)
Potassium: 3.8 mmol/L (ref 3.5–5.1)
Sodium: 140 mmol/L (ref 135–145)

## 2020-05-24 LAB — CBC
HCT: 39.4 % (ref 36.0–46.0)
Hemoglobin: 12.7 g/dL (ref 12.0–15.0)
MCH: 29.6 pg (ref 26.0–34.0)
MCHC: 32.2 g/dL (ref 30.0–36.0)
MCV: 91.8 fL (ref 80.0–100.0)
Platelets: 238 10*3/uL (ref 150–400)
RBC: 4.29 MIL/uL (ref 3.87–5.11)
RDW: 14.7 % (ref 11.5–15.5)
WBC: 13.4 10*3/uL — ABNORMAL HIGH (ref 4.0–10.5)
nRBC: 0 % (ref 0.0–0.2)

## 2020-05-24 NOTE — Progress Notes (Addendum)
Subjective: 1 Day Post-Op Procedure(s) (LRB): LEFT TOTAL KNEE ARTHROPLASTY (Left) Patient reports pain as mild.  Feeling well and progressing with PT  Objective: Vital signs in last 24 hours: Temp:  [97 F (36.1 C)-97.8 F (36.6 C)] 97.6 F (36.4 C) (04/12 0744) Pulse Rate:  [55-76] 62 (04/12 0744) Resp:  [13-18] 17 (04/12 0744) BP: (99-129)/(53-86) 113/68 (04/12 0744) SpO2:  [94 %-100 %] 99 % (04/12 0744)  Intake/Output from previous day: 04/11 0701 - 04/12 0700 In: 1220 [P.O.:220; I.V.:1000] Out: 2650 [Urine:2600; Blood:50] Intake/Output this shift: No intake/output data recorded.  No results for input(s): HGB in the last 72 hours. No results for input(s): WBC, RBC, HCT, PLT in the last 72 hours. No results for input(s): NA, K, CL, CO2, BUN, CREATININE, GLUCOSE, CALCIUM in the last 72 hours. No results for input(s): LABPT, INR in the last 72 hours.  Neurologically intact Neurovascular intact Sensation intact distally Intact pulses distally Dorsiflexion/Plantar flexion intact Incision: dressing C/D/I No cellulitis present Compartment soft   Assessment/Plan: 1 Day Post-Op Procedure(s) (LRB): LEFT TOTAL KNEE ARTHROPLASTY (Left) Advance diet Up with therapy D/C IV fluids Discharge home with home health after second PT session as long as she continues to mobilize WBAT LLE D/c meds sent into pharmacy last week    Anticipated LOS equal to or greater than 2 midnights due to - Age 27 and older with one or more of the following:  - Obesity  - Expected need for hospital services (PT, OT, Nursing) required for safe  discharge  - Anticipated need for postoperative skilled nursing care or inpatient rehab  - Active co-morbidities: None OR   - Unanticipated findings during/Post Surgery: None  - Patient is a high risk of re-admission due to: None    Savannah [name] 05/24/2020, 8:06 AM

## 2020-05-24 NOTE — Discharge Summary (Signed)
Patient ID: Savannah [name] MRN: [id number] DOB/AGE: May 07, 1960 60 y.o.  Admit date: 05/23/2020 Discharge date: 05/24/2020  Admission Diagnoses:  Principal Problem:   Primary osteoarthritis of left knee Active Problems:   Status post total left knee replacement   Discharge Diagnoses:  Same  Past Medical History:  Diagnosis Date  . Arthritis    bilateral knees, recent LCL sprain-on left knee  . CHF (congestive heart failure) ([place])   . Dysrhythmia    trigem  . Environmental allergies   . Family history of adverse reaction to anesthesia    slow to wake up  . Fatty liver   . Headache(784.0)    migraines-on meds for control, relpax, ketoprophen, vicoden, muscle relaxer  . Hearing loss 2016   High frequency hearing - Bilateral   . History of hiatal hernia   . Hot flashes   . Joint pain   . Lichen sclerosus   . Lipoma    Right Knee  . Menorrhagia   . Migraines   . Necrobiosis lipoidica   . Osteoarthritis   . Overweight   . Pelvic pain in female   . PONV (postoperative nausea and vomiting)    ponv, slow to awake  . Seasonal allergies   . Sinus problem   . Sprain 10/25/2010   left knee  . Trigeminal pulse   . Trigger finger, right    Middle finger and right wrist   . Vitamin D deficiency     Surgeries: Procedure(s): LEFT TOTAL KNEE ARTHROPLASTY on 05/23/2020   Consultants:   Discharged Condition: Improved  Hospital Course: Savannah [name] is an 60 y.o. female who was admitted 05/23/2020 for operative treatment ofPrimary osteoarthritis of left knee. Patient has severe unremitting pain that affects sleep, daily activities, and work/hobbies. After pre-op clearance the patient was taken to the operating room on 05/23/2020 and underwent  Procedure(s): LEFT TOTAL KNEE ARTHROPLASTY.    Patient was given perioperative antibiotics:  Anti-infectives (From admission, onward)   Start     Dose/Rate Route Frequency Ordered Stop   05/23/20 1400  ceFAZolin (ANCEF) IVPB 2g/100 mL  premix        2 g 200 mL/hr over 30 Minutes Intravenous Every 6 hours 05/23/20 1053 05/23/20 2047   05/23/20 0820  vancomycin (VANCOCIN) powder  Status:  Discontinued          As needed 05/23/20 0820 05/23/20 0952   05/23/20 0600  ceFAZolin (ANCEF) IVPB 2g/100 mL premix        2 g 200 mL/hr over 30 Minutes Intravenous On call to O.R. 05/23/20 0540 05/23/20 0731       Patient was given sequential compression devices, early ambulation, and chemoprophylaxis to prevent DVT.  Patient benefited maximally from hospital stay and there were no complications.    Recent vital signs:  Patient Vitals for the past 24 hrs:  BP Temp Temp src Pulse Resp SpO2  05/24/20 0744 113/68 97.6 F (36.4 C) Oral 62 17 99 %  05/24/20 0334 (!) 99/53 97.8 F (36.6 C) Oral (!) 57 -- 97 %  05/23/20 2358 109/65 97.6 F (36.4 C) Oral (!) 55 16 98 %  05/23/20 2038 116/71 97.6 F (36.4 C) Oral (!) 58 16 95 %  05/23/20 1611 105/62 97.6 F (36.4 C) Oral 72 17 98 %  05/23/20 1607 105/62 97.6 F (36.4 C) Oral (!) 58 17 96 %  05/23/20 1046 111/84 97.8 F (36.6 C) Oral (!) 59 18 98 %  05/23/20 1022 129/76 [name])  97 F (36.1 C) -- 76 14 100 %  05/23/20 1007 121/86 -- -- 61 16 94 %  05/23/20 0952 104/64 (!) 97 F (36.1 C) -- (!) 59 13 100 %     Recent laboratory studies: No results for input(s): WBC, HGB, HCT, PLT, NA, K, CL, CO2, BUN, CREATININE, GLUCOSE, INR, CALCIUM in the last 72 hours.  Invalid input(s): PT, 2   Discharge Medications:   Allergies as of 05/24/2020      Reactions   Dust Mite Extract Other (See Comments)   Environmental allergies   Nsaids Other (See Comments)   avoided due to bariatric surgery       Medication List    STOP taking these medications   Biotin 10000 MCG Tbdp   cyclobenzaprine 10 MG tablet Commonly known as: [place] 40-5-3.3 MG Tabs Generic drug: Collagen-Boron-Hyaluronic Acid     TAKE these medications   BARIATRIC MULTIVITAMINS/IRON  PO Take 1 tablet by mouth in the morning. BariMelts Multivitamin W/Iron (chewable)   multivitamin-lutein Caps capsule Take 1 capsule by mouth in the morning.   Calcium 500 + D3 250-500 MG-UNIT Chew Generic drug: Ca Phosphate-Cholecalciferol Chew 1 tablet by mouth in the morning, at noon, and at bedtime. Bariatric Calcium Citrate chewy   carvedilol 6.25 MG tablet Commonly known as: COREG Take 1 tablet (6.25 mg total) by mouth 2 (two) times daily with a meal.   clobetasol cream 0.05 % Commonly known as: TEMOVATE Apply 1 application topically as needed (lichen sclerosus). What changed: when to take this   docusate sodium 100 MG capsule Commonly known as: Colace Take 1 capsule (100 mg total) by mouth daily as needed.   eletriptan 40 MG tablet Commonly known as: RELPAX Take 1 tablet (40 mg total) by mouth 2 (two) times daily as needed for migraine or headache.   furosemide 20 MG tablet Commonly known as: LASIX Take 0.5 tablets (10 mg total) by mouth daily as needed for edema.   hyoscyamine 0.125 MG tablet Commonly known as: LEVSIN Take 0.125 mg by mouth every 4 (four) hours as needed (abdominal cramps/spasms).   lisinopril 10 MG tablet Commonly known as: ZESTRIL TAKE 1 TABLET DAILY What changed: when to take this   meclizine 25 MG tablet Commonly known as: ANTIVERT Take 25 mg by mouth 4 (four) times daily as needed for dizziness.   methocarbamol 500 MG tablet Commonly known as: Robaxin Take 1 tablet (500 mg total) by mouth 2 (two) times daily as needed. To be taken post-op   mometasone 0.1 % ointment Commonly known as: ELOCON Use small topically twice weekly for maintenance. What changed:   how much to take  how to take this  when to take this  additional instructions   ondansetron 4 MG tablet Commonly known as: Zofran Take 1 tablet (4 mg total) by mouth every 8 (eight) hours as needed for nausea or vomiting.   oxyCODONE-acetaminophen 5-325 MG  tablet Commonly known as: Percocet Take 1-2 tablets by mouth every 6 (six) hours as needed. To be taken post-op   promethazine 25 MG tablet Commonly known as: PHENERGAN Take 25 mg by mouth every 6 (six) hours as needed for nausea or vomiting.   rivaroxaban 10 MG Tabs tablet Commonly known as: XARELTO Take 1 tablet (10 mg total) by mouth daily. To be taken post-op for dvt prophylaxis            Durable Medical Equipment  (From admission, onward)  Start     Ordered   05/23/20 1054  DME Walker rolling  Once       Question:  Patient needs a walker to treat with the following condition  Answer:  Total knee replacement status   05/23/20 1053   05/23/20 1054  DME 3 n 1  Once        05/23/20 1053   05/23/20 1054  DME Bedside commode  Once       Question:  Patient needs a bedside commode to treat with the following condition  Answer:  Total knee replacement status   05/23/20 1053          Diagnostic Studies: DG Chest 2 View  Result Date: 05/23/2020 CLINICAL DATA:  Preoperative evaluation prior to left total knee replacement EXAM: CHEST - 2 VIEW COMPARISON:  Prior chest x-ray 10/30/2019 FINDINGS: The lungs are clear and negative for focal airspace consolidation, pulmonary edema or suspicious pulmonary nodule. No pleural effusion or pneumothorax. Cardiac and mediastinal contours are within normal limits. No acute fracture or lytic or blastic osseous lesions. The visualized upper abdominal bowel gas pattern is unremarkable. IMPRESSION: No active cardiopulmonary disease. Electronically Signed   By: Jacqulynn Cadet M.D.   On: 05/23/2020 08:02   DG Knee Left Port  Result Date: 05/23/2020 CLINICAL DATA:  Status post total knee replacement EXAM: PORTABLE LEFT KNEE - 1-2 VIEW COMPARISON:  May 05, 2020 FINDINGS: Frontal and lateral views were obtained. Patient is status post total knee replacement with prosthetic components well seated. No fracture or dislocation. No erosion. There  is soft tissue air present as well as air within the knee joint. IMPRESSION: Total knee replacement with prosthetic components well-seated. No fracture or dislocation. Acute postoperative changes noted. Electronically Signed   By: Lowella Grip III M.D.   On: 05/23/2020 10:19   XR KNEE 3 VIEW LEFT  Result Date: 05/05/2020 X-rays demonstrate advanced degenerative changes to the medial patellofemoral compartments   Disposition: Discharge disposition: 01-Home or Self Care          Follow-up Information    Leandrew Koyanagi, MD In 2 weeks.   Specialty: Orthopedic Surgery Why: For suture removal, For wound re-check Contact information: [place] [address] [phone]                Signed: Aundra Dubin 05/24/2020, 8:11 AM

## 2020-05-24 NOTE — Progress Notes (Signed)
Physical Therapy Treatment Patient Details Name: Savannah [name] MRN: [id number] DOB: 10-24-1960 Today's Date: 05/24/2020    History of Present Illness 60 yo female s/p L TKR on 4/11. 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    PT Comments    Pt received in chair, agreeable to therapy session and with good participation and tolerance for gait and stair training. Pt performed functional mobility tasks with supervision to min guard assist using RW and no buckling or LOB noted. Reviewed supine/seated HEP handout and pt with good L knee ROM 0-95 deg. Pt continues to benefit from PT services to progress toward functional mobility goals. Anticipate pt safe to DC home with assist from spouse once medically cleared.  Follow Up Recommendations  Follow surgeon's recommendation for DC plan and follow-up therapies;Supervision for mobility/OOB     Equipment Recommendations  3in1 (PT) (pt may refuse, she said she has sturdy countertop to assist with standing in bathroom)    Recommendations for Other Services       Precautions / Restrictions Precautions Precautions: Fall Restrictions Weight Bearing Restrictions: No    Mobility  Bed Mobility Overal bed mobility: Needs Assistance             General bed mobility comments: received in chair    Transfers Overall transfer level: Needs assistance Equipment used: Rolling walker (2 wheeled) Transfers: Sit to/from Stand Sit to Stand: Min guard;Supervision         General transfer comment: from EOB and chair heights to RW, initial min guard progressing to Supervision  Ambulation/Gait Ambulation/Gait assistance: Min guard Gait Distance (Feet): 130 Feet Assistive device: Rolling walker (2 wheeled) Gait Pattern/deviations: Step-to pattern;Trunk flexed;Antalgic;Decreased weight shift to left;Decreased step length - right Gait velocity: decr Gait velocity interpretation: <1.8 ft/sec, indicate of risk for  recurrent falls General Gait Details: min cues for safety/technique; good use of RW, no LOB or buckling; multiple standing breaks 2/2 pain/fatigue; HR 80-90's bpm, SpO2 93-98% on RA   Stairs Stairs: Yes Stairs assistance: Min assist Stair Management: Two rails;Forwards Number of Stairs: 1 General stair comments: step-to pattern, min cues for sequencing, no LOB but hesitant and pain limited   Wheelchair Mobility    Modified Rankin (Stroke Patients Only)       Balance Overall balance assessment: Needs assistance Sitting-balance support: No upper extremity supported;Feet supported Sitting balance-Leahy Scale: Good     Standing balance support: Bilateral upper extremity supported;During functional activity Standing balance-Leahy Scale: Fair Standing balance comment: able to static stand without UE support, needs RW support for gait due to LLE pain           Cognition Arousal/Alertness: Awake/alert Behavior During Therapy: WFL for tasks assessed/performed Overall Cognitive Status: Within Functional Limits for tasks assessed          General Comments: pleasant, cooperative      Exercises Total Joint Exercises Ankle Circles/Pumps: AROM;Both;10 reps;Seated Long Arc Quad: AROM;Left;10 reps;Seated Knee Flexion: AROM;Left;10 reps;Seated Goniometric ROM: L knee flexion ROM 0 deg to 95 deg    General Comments General comments (skin integrity, edema, etc.): see gait comments, VSS on RA; dressing on L knee clean/dry/intact, TED hose donned; pt had questions on CPM machine schedule, deferred to MD discharge paperwork      Pertinent Vitals/Pain Pain Assessment: Faces Faces Pain Scale: Hurts little more Pain Location: L knee Pain Descriptors / Indicators: Sore;Discomfort Pain Intervention(s): Monitored during session;Premedicated before session;Repositioned;Ice applied  PT Goals (current goals can now be found in the care plan section) Acute Rehab PT  Goals Patient Stated Goal: to get around on my own PT Goal Formulation: With patient Time For Goal Achievement: 06/06/20 Potential to Achieve Goals: Good Progress towards PT goals: Progressing toward goals    Frequency    7X/week      PT Plan Current plan remains appropriate       AM-PAC PT "6 Clicks" Mobility   Outcome Measure  Help needed turning from your back to your side while in a flat bed without using bedrails?: None Help needed moving from lying on your back to sitting on the side of a flat bed without using bedrails?: A Little Help needed moving to and from a bed to a chair (including a wheelchair)?: A Little Help needed standing up from a chair using your arms (e.g., wheelchair or bedside chair)?: A Little Help needed to walk in hospital room?: A Little Help needed climbing 3-5 steps with a railing? : A Little 6 Click Score: 19    End of Session Equipment Utilized During Treatment: Gait belt Activity Tolerance: Patient tolerated treatment well Patient left: in chair;with call bell/phone within reach;with family/visitor present (iceman donned at end of session) Nurse Communication: Mobility status PT Visit Diagnosis: Other abnormalities of gait and mobility (R26.89);Difficulty in walking, not elsewhere classified (R26.2)     Time: 7741-2878 PT Time Calculation (min) (ACUTE ONLY): 37 min  Charges:  $Gait Training: 8-22 mins $Therapeutic Exercise: 8-22 mins                     Kellyann Ordway P., PTA Acute Rehabilitation Services Pager: [phone] Office: [place] 05/24/2020, 10:25 AM

## 2020-05-24 NOTE — Telephone Encounter (Signed)
Returned pts call.   Closed the circle on her questions.

## 2020-05-24 NOTE — Progress Notes (Signed)
Patient was transported via wheelchair by volunteer for discharge home; in no acute distress nor complaints of pain nor discomfort; husband took all her belongings with him; discharge instructions given by RN and she verbalized understanding.

## 2020-05-24 NOTE — TOC Progression Note (Signed)
Transition of Care [facility]) - Progression Note    Patient Details  Name: Savannah [name] MRN: [id number] Date of Birth: Oct 10, 1960  [facility] of [facility]) CM/SW Contact  Jacalyn Lefevre Edson Snowball, RN Phone Number: 05/24/2020, 8:54 AM  Clinical Narrative:     HHPT has been arranged by MD office with [facility] confirmed with [place].   Patient already has walker and does not want 3 in 1   Expected Discharge Plan: [place] Barriers to Discharge: No Barriers Identified  Expected Discharge Plan and Services Expected Discharge Plan: [place]   Discharge Planning Services: CM Consult Post Acute Care Choice: [place] arrangements for the past 2 months: Single Family Home Expected Discharge Date: 05/24/20               DME Arranged: N/A         HH Arranged: PT [place]: [place] (now [facility]) Date [place]: 05/24/20 Time [place]: [phone] Representative spoke with at [place]: [place]   Social Determinants of Health ([place]) Interventions    Readmission Risk Interventions No flowsheet data found.

## 2020-05-25 ENCOUNTER — Encounter (HOSPITAL_COMMUNITY): Payer: Self-pay | Admitting: Orthopaedic Surgery

## 2020-06-02 ENCOUNTER — Ambulatory Visit (HOSPITAL_BASED_OUTPATIENT_CLINIC_OR_DEPARTMENT_OTHER): Payer: BC Managed Care – PPO | Admitting: Obstetrics & Gynecology

## 2020-06-03 ENCOUNTER — Other Ambulatory Visit: Payer: Self-pay | Admitting: Physician Assistant

## 2020-06-07 ENCOUNTER — Encounter: Payer: BC Managed Care – PPO | Admitting: Orthopaedic Surgery

## 2020-06-08 ENCOUNTER — Ambulatory Visit (INDEPENDENT_AMBULATORY_CARE_PROVIDER_SITE_OTHER): Payer: BC Managed Care – PPO | Admitting: Physician Assistant

## 2020-06-08 ENCOUNTER — Encounter: Payer: Self-pay | Admitting: Orthopaedic Surgery

## 2020-06-08 ENCOUNTER — Other Ambulatory Visit: Payer: Self-pay

## 2020-06-08 DIAGNOSIS — Z96652 Presence of left artificial knee joint: Secondary | ICD-10-CM

## 2020-06-08 MED ORDER — OXYCODONE-ACETAMINOPHEN 5-325 MG PO TABS: 1.0000 | ORAL_TABLET | Freq: Three times a day (TID) | ORAL | 0 refills | Status: DC | PRN

## 2020-06-08 MED ORDER — METHOCARBAMOL 500 MG PO TABS: 500.0000 mg | ORAL_TABLET | Freq: Two times a day (BID) | ORAL | 0 refills | Status: DC | PRN

## 2020-06-08 NOTE — Progress Notes (Signed)
Post-Op Visit Note   Patient: Savannah [name]           Date of Birth: 11-15-1960           MRN: [id number] Visit Date: 06/08/2020 PCP: Lawerance Cruel, MD   Assessment & Plan:  Chief Complaint:  Chief Complaint  Patient presents with  . Left Knee - Pain   Visit Diagnoses:  1. Hx of total knee replacement, left     Plan: Patient is a pleasant 60 year old female who comes in today 2 weeks out left total knee replacement.  She has been doing well.  She has been mobilizing well with home health physical therapy.  She is ambulating with a walker 70% of the time and again 30% of the time.  She is taking Percocet mostly prior to physical therapy.  Examination of her left knee reveals a fully healed surgical scar with nylon sutures in place.  No evidence of infection or cellulitis.  Calves are soft nontender.  Range of motion 0 to 104 degrees.  She is neurovascular intact distally.  Today, sutures were removed and Steri-Strips applied.  She has 2 remaining sessions of home health physical therapy and then we will transition her to an outpatient setting.  Internal referral has been made.  I refilled her Percocet and Robaxin.  She will follow-up with [place] in 4 weeks time for repeat evaluation and 2 view x-rays of the left knee.  Follow-Up Instructions: Return in about 4 weeks (around 07/06/2020).   Orders:  Orders Placed This Encounter  Procedures  . Ambulatory referral to Physical Therapy   Meds ordered this encounter  Medications  . oxyCODONE-acetaminophen (PERCOCET) 5-325 MG tablet    Sig: Take 1-2 tablets by mouth every 8 (eight) hours as needed.    Dispense:  40 tablet    Refill:  0  . methocarbamol (ROBAXIN) 500 MG tablet    Sig: Take 1 tablet (500 mg total) by mouth 2 (two) times daily as needed.    Dispense:  20 tablet    Refill:  0    Imaging: No new imaging  PMFS History: Patient Active Problem List   Diagnosis Date Noted  . Status post total left knee replacement  05/23/2020  . Primary osteoarthritis of left knee 05/05/2020  . 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 Fatty liver disease, nonalcoholic 52/84/1324  . 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  . Trigger finger, right middle finger 10/10/2018  . Other insomnia 06/23/2018  . Vitamin D deficiency 03/26/2018  . Prediabetes 03/11/2018  . Obesity 02/26/2018  . Migraines 10/01/2016  . Decreased cardiac ejection fraction 10/01/2016  . Severe obesity ([place]) 10/01/2016  . Shingles 08/03/2016  . Chronic pain of right knee 05/24/2016  . Unilateral primary osteoarthritis, right knee 05/24/2016  . Posterior tibial tendinitis, right leg 01/30/2016  . Lichen sclerosus 40/11/2723   Past Medical History:  Diagnosis Date  . Arthritis    bilateral knees, recent LCL sprain-on left knee  . CHF (congestive heart failure) ([place])   . Dysrhythmia    trigem  . Environmental allergies   . Family history of adverse reaction to anesthesia    slow to wake up  . Fatty liver   . Headache(784.0)    migraines-on meds for control, relpax, ketoprophen, vicoden, muscle relaxer  . Hearing loss 2016   High frequency hearing - Bilateral   . History of hiatal hernia   . Hot flashes   . Joint pain   .  Lichen sclerosus   . Lipoma    Right Knee  . Menorrhagia   . Migraines   . Necrobiosis lipoidica   . Osteoarthritis   . Overweight   . Pelvic pain in female   . PONV (postoperative nausea and vomiting)    ponv, slow to awake  . Seasonal allergies   . Sinus problem   . Sprain 10/25/2010   left knee  . Trigeminal pulse   . Trigger finger, right    Middle finger and right wrist   . Vitamin D deficiency     Family History  Problem Relation Age of Onset  . Skin cancer Mother   . Migraines Mother   . High blood pressure Mother   . Heart disease Father   . High blood pressure Father   . High Cholesterol Father   . Breast cancer Maternal Aunt 70  . Breast cancer Paternal Aunt 37  .  Migraines Brother     Past Surgical History:  Procedure Laterality Date  . ABDOMINAL HYSTERECTOMY    . APPENDECTOMY    . CHOLECYSTECTOMY    . DIAGNOSTIC LAPAROSCOPY  08/2006  . DILATION AND CURETTAGE OF UTERUS  2011   attempted ablation  . FUNCTIONAL ENDOSCOPIC SINUS SURGERY    . ganglion cyst removal    . HYSTEROSCOPY     failed  . KNEE ARTHROPLASTY Right 2018   meniscus  . LAPAROSCOPIC GASTRIC BAND REMOVAL WITH LAPAROSCOPIC GASTRIC SLEEVE RESECTION  02/02/2020   pt denies gastric band removal. Had gastric sleeve resection and gallbladder removal.  . LASIK    . LIPOMA EXCISION Right 07/22/2014   Procedure: EXCISION RIGHT THIGH LIPOMA;  Surgeon: Erroll Luna, MD;  Location: [facility];  Service: General;  Laterality: Right;  . ROBOTIC ASSISTED TOTAL HYSTERECTOMY  11/06/10   TLH/RSO  . SALPINGOOPHORECTOMY  11/06/2010   Procedure: SALPINGO OOPHERECTOMY;  Surgeon: Felipa Emory;  Location: [facility];  Service: Gynecology;  Laterality: Right;  . TOTAL KNEE ARTHROPLASTY Left 05/23/2020   Procedure: LEFT TOTAL KNEE ARTHROPLASTY;  Surgeon: Leandrew Koyanagi, MD;  Location: [facility];  Service: Orthopedics;  Laterality: Left;  . TRIGGER FINGER RELEASE    . UPPER GI ENDOSCOPY N/A 02/02/2020   Procedure: UPPER GI ENDOSCOPY;  Surgeon: [name], MD;  Location: [facility];  Service: General;  Laterality: N/A;   Social History   Occupational History  . Occupation: Administrator, [employer]  Tobacco Use  . Smoking status: Never Smoker  . Smokeless tobacco: Never Used  Vaping Use  . Vaping Use: Never used  Substance and Sexual Activity  . Alcohol use: No    Alcohol/week: 0.0 standard drinks  . Drug use: No  . Sexual activity: Yes    Birth control/protection: Surgical    Comment: TLH/RSO

## 2020-06-10 ENCOUNTER — Telehealth: Payer: Self-pay

## 2020-06-10 MED ORDER — OXYCODONE-ACETAMINOPHEN 5-325 MG PO TABS: 1.0000 | ORAL_TABLET | Freq: Three times a day (TID) | ORAL | 0 refills | Status: DC | PRN

## 2020-06-10 NOTE — Telephone Encounter (Signed)
Called patient. She is aware.   

## 2020-06-10 NOTE — Telephone Encounter (Signed)
Pending Approval

## 2020-06-10 NOTE — Telephone Encounter (Signed)
Savannah [name] (Key: [id number]) - [id number] oxyCODONE-Acetaminophen 5-325MG  tablets     Status: PA Response - Approved

## 2020-06-10 NOTE — Telephone Encounter (Signed)
Savannah [name] (Key: [id number])  Your information has been submitted to [place]. To check for an updated outcome later, reopen this PA request from your dashboard.  If [employer] has not responded to your request within 24 hours, contact [place] at [phone]. If you think there may be a problem with your PA request, use our live chat feature at the bottom right.

## 2020-06-10 NOTE — Telephone Encounter (Signed)
[facility] called and they need you to resend Rx for Oxycodone. "it was deleted "    Patient is a post op. Can you please send this in again.

## 2020-06-10 NOTE — Addendum Note (Signed)
Addended by: Azucena Cecil on: 06/10/2020 08:22 PM   Modules accepted: Orders

## 2020-06-10 NOTE — Telephone Encounter (Signed)
done

## 2020-06-13 NOTE — Telephone Encounter (Signed)
Called patient. She did pick up Rx.

## 2020-06-15 ENCOUNTER — Ambulatory Visit (INDEPENDENT_AMBULATORY_CARE_PROVIDER_SITE_OTHER): Payer: BC Managed Care – PPO | Admitting: Rehabilitative and Restorative Service Providers"

## 2020-06-15 ENCOUNTER — Encounter: Payer: Self-pay | Admitting: Rehabilitative and Restorative Service Providers"

## 2020-06-15 ENCOUNTER — Other Ambulatory Visit: Payer: Self-pay

## 2020-06-15 DIAGNOSIS — M6281 Muscle weakness (generalized): Secondary | ICD-10-CM

## 2020-06-15 DIAGNOSIS — M25562 Pain in left knee: Secondary | ICD-10-CM

## 2020-06-15 DIAGNOSIS — G8929 Other chronic pain: Secondary | ICD-10-CM

## 2020-06-15 DIAGNOSIS — M25662 Stiffness of left knee, not elsewhere classified: Secondary | ICD-10-CM | POA: Diagnosis not present

## 2020-06-15 DIAGNOSIS — R262 Difficulty in walking, not elsewhere classified: Secondary | ICD-10-CM | POA: Diagnosis not present

## 2020-06-15 NOTE — Patient Instructions (Signed)
Access Code: [email or web address] URL: [email or web address] Date: 06/15/2020 Prepared by: Vista Mink  Exercises Supine Quadricep Sets - 3-5 x daily - 7 x weekly - 3-5 sets - 10 reps - 5 second hold T[place]gate knee flexion AROM - 3 minutes 5X/Day

## 2020-06-15 NOTE — Therapy (Signed)
[facility] [place], [place], [address] Phone: [phone]   Fax:  [phone]  Physical Therapy Evaluation  Patient Details  Name: Savannah [name] MRN: [id number] Date of Birth: 08-Aug-1960 Referring Provider (PT): Frankey Shown MD   Encounter Date: 06/15/2020   PT End of Session - 06/15/20 1655    Visit Number 1    Number of Visits 24    PT Start Time 1600    PT Stop Time 5427    PT Time Calculation (min) 39 min    Activity Tolerance Patient tolerated treatment well;No increased pain;Patient limited by fatigue    Behavior During Therapy [facility] for tasks assessed/performed           Past Medical History:  Diagnosis Date  . Arthritis    bilateral knees, recent LCL sprain-on left knee  . CHF (congestive heart failure) ([place])   . Dysrhythmia    trigem  . Environmental allergies   . Family history of adverse reaction to anesthesia    slow to wake up  . Fatty liver   . Headache(784.0)    migraines-on meds for control, relpax, ketoprophen, vicoden, muscle relaxer  . Hearing loss 2016   High frequency hearing - Bilateral   . History of hiatal hernia   . Hot flashes   . Joint pain   . Lichen sclerosus   . Lipoma    Right Knee  . Menorrhagia   . Migraines   . Necrobiosis lipoidica   . Osteoarthritis   . Overweight   . Pelvic pain in female   . PONV (postoperative nausea and vomiting)    ponv, slow to awake  . Seasonal allergies   . Sinus problem   . Sprain 10/25/2010   left knee  . Trigeminal pulse   . Trigger finger, right    Middle finger and right wrist   . Vitamin D deficiency     Past Surgical History:  Procedure Laterality Date  . ABDOMINAL HYSTERECTOMY    . APPENDECTOMY    . CHOLECYSTECTOMY    . DIAGNOSTIC LAPAROSCOPY  08/2006  . DILATION AND CURETTAGE OF UTERUS  2011   attempted ablation  . FUNCTIONAL ENDOSCOPIC SINUS SURGERY    . ganglion cyst removal    . HYSTEROSCOPY     failed  . KNEE ARTHROPLASTY  Right 2018   meniscus  . LAPAROSCOPIC GASTRIC BAND REMOVAL WITH LAPAROSCOPIC GASTRIC SLEEVE RESECTION  02/02/2020   pt denies gastric band removal. Had gastric sleeve resection and gallbladder removal.  . LASIK    . LIPOMA EXCISION Right 07/22/2014   Procedure: EXCISION RIGHT THIGH LIPOMA;  Surgeon: Erroll Luna, MD;  Location: [facility];  Service: General;  Laterality: Right;  . ROBOTIC ASSISTED TOTAL HYSTERECTOMY  11/06/10   TLH/RSO  . SALPINGOOPHORECTOMY  11/06/2010   Procedure: SALPINGO OOPHERECTOMY;  Surgeon: Felipa Emory;  Location: [facility];  Service: Gynecology;  Laterality: Right;  . TOTAL KNEE ARTHROPLASTY Left 05/23/2020   Procedure: LEFT TOTAL KNEE ARTHROPLASTY;  Surgeon: Leandrew Koyanagi, MD;  Location: [facility];  Service: Orthopedics;  Laterality: Left;  . TRIGGER FINGER RELEASE    . UPPER GI ENDOSCOPY N/A 02/02/2020   Procedure: UPPER GI ENDOSCOPY;  Surgeon: [name], MD;  Location: [facility];  Service: General;  Laterality: N/A;    There were no vitals filed for this visit.    Subjective Assessment - 06/15/20 1650    Subjective [name] had a L knee TKA on  05/23/2020.  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    Patient is accompained by: Family member    Pertinent History R knee scope 2018, Bariatric surgery, CHF    Limitations Sitting;House hold activities;Standing;Walking    How long can you sit comfortably? Stiffness with standing after prolonged sitting    How long can you stand comfortably? Short periods of time (non-specific)    How long can you walk comfortably? Short (household) distances with a cane    Patient Stated Goals Walk better without the cane for yard work and return to work    Currently in Pain? Yes    Pain Score 5     Pain Location Knee    Pain Orientation Left    Pain Descriptors / Indicators Aching;Constant;Sore;Pressure;Tightness    Pain Type Chronic  pain;Surgical pain    Pain Radiating Towards NA    Pain Onset More than a month ago    Pain Frequency Constant    Aggravating Factors  Prolonged postures    Pain Relieving Factors Medications    Effect of Pain on Daily Activities Not working full-time as an Optometrist yet    Multiple Pain Sites No              [facility] PT Assessment - 06/15/20 0001      Assessment   Medical Diagnosis s/p L TKA    Referring Provider (PT) Frankey Shown MD    Onset Date/Surgical Date 05/23/20    Next MD Visit 07/06/2020    Prior Therapy Home health      Balance Screen   Has the patient fallen in the past 6 months No    Has the patient had a decrease in activity level because of a fear of falling?  No    Is the patient reluctant to leave their home because of a fear of falling?  No      Home Ecologist residence    Additional Comments 13 steps      Prior Function   Level of Independence Independent    Vocation Full time employment    Psychologist, sport and exercise    Leisure [place] work, read, some walking      Cognition   Overall Cognitive Status Within Functional Limits for tasks assessed      Observation/Other Assessments   Focus on Therapeutic Outcomes (FOTO)  35 (Goal 59)      ROM / Strength   AROM / PROM / Strength AROM;Strength      AROM   Overall AROM  Deficits    AROM Assessment Site Knee    Right/Left Knee Left;Right    Right Knee Extension 0    Right Knee Flexion 135    Left Knee Extension -15    Left Knee Flexion 90      Strength   Overall Strength Comments B knee strength deficits noted functionally      Ambulation/Gait   Gait Comments [name] is using a cane inside and outside the home                      Objective measurements completed on examination: See above findings.       [facility] Adult PT Treatment/Exercise - 06/15/20 0001      Exercises   Exercises Knee/Hip      Knee/Hip Exercises: Aerobic   Recumbent  Bike Seat 6 for 10 minutes (8 minutes)      Knee/Hip Exercises: Seated   Other Seated Knee/Hip Exercises Tailgate kne flexion AROM 3 minutes      Knee/Hip Exercises: Supine   Quad Sets Strengthening;Both;Other (comment)    Quad Sets Limitations 5 minutes with 5 second holds and rest PRN                  PT Education - 06/15/20 1655    Education Details Reviewed HEP and exam findings    Person(s) Educated Patient;Spouse    Methods Explanation;Demonstration;Tactile cues;Handout;Verbal cues    Comprehension Verbal cues required;Returned demonstration;Need further instruction;Verbalized understanding;Tactile cues required            PT Short Term Goals - 06/15/20 1700      PT SHORT TERM GOAL #1   Title [name] will report independence and compliance with her starter HEP addressing AROM and quadriceps strength impairments.    Time 2    Period Weeks    Status New    Target Date 06/29/20             PT Long Term Goals - 06/15/20 1701      PT LONG TERM GOAL #1   Title Improve FOTO score to 59.    Baseline 35    Time 8    Period Weeks    Status New    Target Date 08/10/20      PT LONG TERM GOAL #2   Title Improve L knee pain to consistently 0-3/10 on the Numeric Pain Rating Scale.    Baseline Can be 5+/10    Time 8    Period Weeks    Status New    Target Date 08/10/20      PT LONG TERM GOAL #3   Title [name] will be able to walk 1/4 mile or more from her car to her office without an assistive device or complaints of functional difficulty.    Baseline Unable with cane.    Time 8    Period Weeks    Status New    Target Date 08/10/20      PT LONG TERM GOAL #4   Title Improve L knee AROM for extension to -3 degrees or better and flexion to 110 degrees or better by DC.    Baseline -15 and 90, respectively    Time 8    Period Weeks    Status New    Target Date 08/10/20      PT LONG TERM GOAL #5   Title [name] will be independent and compliant with her  long-term HEP at DC.    Time 8    Period Weeks    Status New    Target Date 08/10/20                  Plan - 06/15/20 1656    Clinical Impression Statement [name] has the expected AROM, strength and functional deficitis s/p L TKA 05/23/2020.  Knee extension AROM, flexion AROM and quadriceps strength will be the emphasis early post-surgery.  Balance, gait and specific functional activities will be addressed as appropriate.    Personal Factors and Comorbidities Comorbidity 3+    Comorbidities Previous R knee scope in 2018, bariatric surgery, CHF    Examination-Activity Limitations Bathing;Dressing;Sit;Transfers;Bed Mobility;Sleep;Bend;Lift;Squat;Locomotion Level;Stairs;Carry;Stand    Examination-Participation Restrictions Interpersonal Relationship;Occupation;Yard Work;Community Activity    Stability/Clinical Decision Making Stable/Uncomplicated    Clinical Decision Making Low    Rehab Potential  Good    PT Frequency 3x / week    PT Duration 8 weeks    PT Treatment/Interventions ADLs/Self Care Home Management;Electrical Stimulation;Cryotherapy;Therapeutic activities;Stair training;Gait training;Therapeutic exercise;Balance training;Neuromuscular re-education;Patient/family education;Manual techniques;Vasopneumatic Device    PT Next Visit Plan Progress quadriceps strength, possibly stairs and balance starter activities, leg press, knee extension machine, vaso    PT Home Exercise Plan Access Code: [email or web address]    Consulted and Agree with Plan of Care Patient;Family member/caregiver    Family Member Consulted Husband           Patient will benefit from skilled therapeutic intervention in order to improve the following deficits and impairments:  Abnormal gait,Decreased activity tolerance,Decreased balance,Decreased endurance,Decreased range of motion,Decreased strength,Difficulty walking,Increased edema,Impaired flexibility,Pain,Obesity  Visit Diagnosis: Difficulty walking  Muscle  weakness (generalized)  Stiffness of left knee, not elsewhere classified  Chronic pain of left knee     Problem List Patient Active Problem List   Diagnosis Date Noted  . Status post total left knee replacement 05/23/2020  . Primary osteoarthritis of left knee 05/05/2020  . 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 Fatty liver disease, nonalcoholic 99991111  . 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  . Trigger finger, right middle finger 10/10/2018  . Other insomnia 06/23/2018  . Vitamin D deficiency 03/26/2018  . Prediabetes 03/11/2018  . Obesity 02/26/2018  . Migraines 10/01/2016  . Decreased cardiac ejection fraction 10/01/2016  . Severe obesity ([place]) 10/01/2016  . Shingles 08/03/2016  . Chronic pain of right knee 05/24/2016  . Unilateral primary osteoarthritis, right knee 05/24/2016  . Posterior tibial tendinitis, right leg 01/30/2016  . Lichen sclerosus [id number]    Farley Ly PT, MPT 06/15/2020, 5:11 PM  [facility] Physical Therapy [address] [place], [place], [address] Phone: [phone]   Fax:  [phone]  Name: AIDALIZ KESTERSON MRN: [id number] Date of Birth: 1960/10/23

## 2020-06-17 ENCOUNTER — Ambulatory Visit (INDEPENDENT_AMBULATORY_CARE_PROVIDER_SITE_OTHER): Payer: BC Managed Care – PPO | Admitting: Rehabilitative and Restorative Service Providers"

## 2020-06-17 ENCOUNTER — Encounter: Payer: Self-pay | Admitting: Rehabilitative and Restorative Service Providers"

## 2020-06-17 ENCOUNTER — Other Ambulatory Visit: Payer: Self-pay

## 2020-06-17 DIAGNOSIS — M25562 Pain in left knee: Secondary | ICD-10-CM | POA: Diagnosis not present

## 2020-06-17 DIAGNOSIS — M25662 Stiffness of left knee, not elsewhere classified: Secondary | ICD-10-CM

## 2020-06-17 DIAGNOSIS — R262 Difficulty in walking, not elsewhere classified: Secondary | ICD-10-CM

## 2020-06-17 DIAGNOSIS — M6281 Muscle weakness (generalized): Secondary | ICD-10-CM | POA: Diagnosis not present

## 2020-06-17 DIAGNOSIS — G8929 Other chronic pain: Secondary | ICD-10-CM

## 2020-06-17 DIAGNOSIS — R600 Localized edema: Secondary | ICD-10-CM

## 2020-06-17 NOTE — Therapy (Signed)
[facility] [place], [place], [address] Phone: [phone]   Fax:  [phone]  Physical Therapy Treatment  Patient Details  Name: Savannah [name] MRN: [id number] Date of Birth: [birth year] Referring Provider (PT): Frankey Shown MD   Encounter Date: 06/17/2020   PT End of Session - 06/17/20 0958    Visit Number 2    Number of Visits 24    PT Start Time 0920    PT Stop Time 4315    PT Time Calculation (min) 54 min    Activity Tolerance Patient tolerated treatment well;No increased pain;Patient limited by fatigue    Behavior During Therapy [facility] for tasks assessed/performed           Past Medical History:  Diagnosis Date  . Arthritis    bilateral knees, recent LCL sprain-on left knee  . CHF (congestive heart failure) ([place])   . Dysrhythmia    trigem  . Environmental allergies   . Family history of adverse reaction to anesthesia    slow to wake up  . Fatty liver   . Headache(784.0)    migraines-on meds for control, relpax, ketoprophen, vicoden, muscle relaxer  . Hearing loss 2016   High frequency hearing - Bilateral   . History of hiatal hernia   . Hot flashes   . Joint pain   . Lichen sclerosus   . Lipoma    Right Knee  . Menorrhagia   . Migraines   . Necrobiosis lipoidica   . Osteoarthritis   . Overweight   . Pelvic pain in female   . PONV (postoperative nausea and vomiting)    ponv, slow to awake  . Seasonal allergies   . Sinus problem   . Sprain 10/25/2010   left knee  . Trigeminal pulse   . Trigger finger, right    Middle finger and right wrist   . Vitamin D deficiency     Past Surgical History:  Procedure Laterality Date  . ABDOMINAL HYSTERECTOMY    . APPENDECTOMY    . CHOLECYSTECTOMY    . DIAGNOSTIC LAPAROSCOPY  08/2006  . DILATION AND CURETTAGE OF UTERUS  2011   attempted ablation  . FUNCTIONAL ENDOSCOPIC SINUS SURGERY    . ganglion cyst removal    . HYSTEROSCOPY     failed  . KNEE ARTHROPLASTY  Right 2018   meniscus  . LAPAROSCOPIC GASTRIC BAND REMOVAL WITH LAPAROSCOPIC GASTRIC SLEEVE RESECTION  02/02/2020   pt denies gastric band removal. Had gastric sleeve resection and gallbladder removal.  . LASIK    . LIPOMA EXCISION Right 07/22/2014   Procedure: EXCISION RIGHT THIGH LIPOMA;  Surgeon: Erroll Luna, MD;  Location: [facility];  Service: General;  Laterality: Right;  . ROBOTIC ASSISTED TOTAL HYSTERECTOMY  11/06/10   TLH/RSO  . SALPINGOOPHORECTOMY  11/06/2010   Procedure: SALPINGO OOPHERECTOMY;  Surgeon: Felipa Emory;  Location: [facility];  Service: Gynecology;  Laterality: Right;  . TOTAL KNEE ARTHROPLASTY Left 05/23/2020   Procedure: LEFT TOTAL KNEE ARTHROPLASTY;  Surgeon: Leandrew Koyanagi, MD;  Location: [facility];  Service: Orthopedics;  Laterality: Left;  . TRIGGER FINGER RELEASE    . UPPER GI ENDOSCOPY N/A 02/02/2020   Procedure: UPPER GI ENDOSCOPY;  Surgeon: [name], MD;  Location[facility];  Service: General;  Laterality: N/A;    There were no vitals filed for this visit.   Subjective Assessment - 06/17/20 0927    Subjective Savannah [name] reports good early HEP compliance.  Patient is accompained by: Family member    Pertinent History R knee scope 2018, Bariatric surgery, CHF    Limitations Sitting;House hold activities;Standing;Walking    How long can you sit comfortably? Stiffness with standing after prolonged sitting    How long can you stand comfortably? Short periods of time (non-specific)    How long can you walk comfortably? Short (household) distances with a cane    Patient Stated Goals Walk better without the cane for yard work and return to work    Currently in Pain? 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    Multiple Pain Sites No                             [facility] Adult PT Treatment/Exercise - 06/17/20 0001      Ambulation/Gait   Gait Comments Savannah [name] is using a cane inside and outside the home      Exercises   Exercises Knee/Hip      Knee/Hip Exercises: Aerobic   Recumbent Bike Seat 6 for 10 minutes      Knee/Hip Exercises: Machines for Strengthening   Total Gym Leg Press 75# 2 sets of 10 slow eccentrics B legs      Knee/Hip Exercises: Seated   Other Seated Knee/Hip Exercises Tailgate knee flexion AROM 3 minutes      Knee/Hip Exercises: Supine   Quad Sets Strengthening;Both;Other (comment)    Quad Sets Limitations 5 minutes with 5 second hold and rest PRN    Heel Slides AAROM;Left;10 reps;Limitations    Heel Slides Limitations 10 second hold with belt self-overpressure and straighten out with quadriceps sets in between reps                  PT Education - 06/17/20 0956    Education Details Reviewed HEP and progressed clinic program    Person(s) Educated Patient    Methods Explanation;Demonstration;Tactile cues;Verbal cues    Comprehension Verbalized understanding;Returned demonstration;Verbal cues required;Tactile cues required;Need further instruction            PT Short Term Goals - 06/17/20 0957      PT SHORT TERM GOAL #1   Title Savannah [name] will report independence and compliance with her starter HEP addressing AROM and quadriceps strength impairments.    Time 2    Period Weeks    Status Achieved    Target Date 06/29/20             PT Long Term Goals - 06/17/20 0957      PT LONG TERM GOAL #1   Title Improve FOTO score to 59.    Baseline 35    Time 8    Period Weeks    Status On-going      PT LONG TERM GOAL #2   Title Improve L knee pain to consistently  0-3/10 on the Numeric Pain Rating Scale.    Baseline Can be 5+/10    Time  8    Period Weeks    Status On-going      PT LONG TERM GOAL #3   Title Savannah [name] will be able to walk 1/4 mile or more from her car to her office without an assistive device or complaints of functional difficulty.    Baseline Unable with cane.    Time 8    Period Weeks    Status On-going      PT LONG TERM GOAL #4   Title Improve L knee AROM for extension to -3 degrees or better and flexion to 110 degrees or better by DC.    Baseline -15 and 90, respectively    Time 8    Period Weeks    Status On-going      PT LONG TERM GOAL #5   Title Savannah [name] will be independent and compliant with her long-term HEP at DC.    Time 8    Period Weeks    Status On-going                 Plan - 06/17/20 1006    Clinical Impression Statement Savannah [name] reports excellent early HEP compliance.  She needed some feedback with her exercises, although overall did a great job for just her 2nd PT visit.  Knee extension AROM and strength and knee flexion AROM remain the early emphasis.    Personal Factors and Comorbidities Comorbidity 3+    Comorbidities Previous R knee scope in 2018, bariatric surgery, CHF    Examination-Activity Limitations Bathing;Dressing;Sit;Transfers;Bed Mobility;Sleep;Bend;Lift;Squat;Locomotion Level;Stairs;Carry;Stand    Examination-Participation Restrictions Interpersonal Relationship;Occupation;Yard Work;Community Activity    Stability/Clinical Decision Making Stable/Uncomplicated    Rehab Potential Good    PT Frequency 3x / week    PT Duration 8 weeks    PT Treatment/Interventions ADLs/Self Care Home Management;Electrical Stimulation;Cryotherapy;Therapeutic activities;Stair training;Gait training;Therapeutic exercise;Balance training;Neuromuscular re-education;Patient/family education;Manual techniques;Vasopneumatic Device    PT Next Visit Plan Progress quadriceps strength, possibly stairs and balance starter  activities, leg press, knee extension machine, vaso    PT Home Exercise Plan Access Code: [email or web address]    Consulted and Agree with Plan of Care Patient;Family member/caregiver    Family Member Consulted Husband           Patient will benefit from skilled therapeutic intervention in order to improve the following deficits and impairments:  Abnormal gait,Decreased activity tolerance,Decreased balance,Decreased endurance,Decreased range of motion,Decreased strength,Difficulty walking,Increased edema,Impaired flexibility,Pain,Obesity  Visit Diagnosis: Difficulty walking  Muscle weakness (generalized)  Stiffness of left knee, not elsewhere classified  Chronic pain of left knee  Localized edema     Problem List Patient Active Problem List   Diagnosis Date Noted  . Status post total left knee replacement 05/23/2020  . Primary osteoarthritis of left knee 05/05/2020  . 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 Fatty liver disease, nonalcoholic 99991111  . 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  . Trigger finger, right middle finger 10/10/2018  . Other insomnia 06/23/2018  . Vitamin D deficiency 03/26/2018  . Prediabetes 03/11/2018  . Obesity 02/26/2018  . Migraines 10/01/2016  . Decreased cardiac ejection fraction 10/01/2016  . Severe obesity ([place]) 10/01/2016  . Shingles 08/03/2016  . Chronic pain of right knee 05/24/2016  . Unilateral primary osteoarthritis, right knee 05/24/2016  . Posterior tibial tendinitis, right leg 01/30/2016  . Lichen sclerosus [id number]    Savannah [name] PT, MPT 06/17/2020, 10:10 AM  [facility] Physical Therapy [address] [place], [place], [address] Phone: [phone]   Fax:  [phone]  Name:  Savannah [name] MRN: [id number] Date of Birth: Jan 15, 1961

## 2020-06-20 ENCOUNTER — Other Ambulatory Visit: Payer: Self-pay

## 2020-06-20 ENCOUNTER — Ambulatory Visit (INDEPENDENT_AMBULATORY_CARE_PROVIDER_SITE_OTHER): Payer: BC Managed Care – PPO | Admitting: Physical Therapy

## 2020-06-20 DIAGNOSIS — M25662 Stiffness of left knee, not elsewhere classified: Secondary | ICD-10-CM | POA: Diagnosis not present

## 2020-06-20 DIAGNOSIS — M6281 Muscle weakness (generalized): Secondary | ICD-10-CM | POA: Diagnosis not present

## 2020-06-20 DIAGNOSIS — G8929 Other chronic pain: Secondary | ICD-10-CM

## 2020-06-20 DIAGNOSIS — M25562 Pain in left knee: Secondary | ICD-10-CM

## 2020-06-20 DIAGNOSIS — R262 Difficulty in walking, not elsewhere classified: Secondary | ICD-10-CM

## 2020-06-20 DIAGNOSIS — R600 Localized edema: Secondary | ICD-10-CM

## 2020-06-20 NOTE — Therapy (Signed)
[facility] [address] [place], [place], [address] Phone: [phone]   Fax:  [phone]  Physical Therapy Treatment  Patient Details  Name: Savannah [name] MRN: [id number] Date of Birth: 1960-12-01 Referring Provider (PT): Frankey Shown MD   Encounter Date: 06/20/2020   PT End of Session - 06/20/20 1534    Visit Number 3    Number of Visits 24    PT Start Time 7672    PT Stop Time 0947    PT Time Calculation (min) 48 min    Activity Tolerance Patient tolerated treatment well;No increased pain;Patient limited by fatigue    Behavior During Therapy [facility] for tasks assessed/performed           Past Medical History:  Diagnosis Date  . Arthritis    bilateral knees, recent LCL sprain-on left knee  . CHF (congestive heart failure) ([place])   . Dysrhythmia    trigem  . Environmental allergies   . Family history of adverse reaction to anesthesia    slow to wake up  . Fatty liver   . Headache(784.0)    migraines-on meds for control, relpax, ketoprophen, vicoden, muscle relaxer  . Hearing loss 2016   High frequency hearing - Bilateral   . History of hiatal hernia   . Hot flashes   . Joint pain   . Lichen sclerosus   . Lipoma    Right Knee  . Menorrhagia   . Migraines   . Necrobiosis lipoidica   . Osteoarthritis   . Overweight   . Pelvic pain in female   . PONV (postoperative nausea and vomiting)    ponv, slow to awake  . Seasonal allergies   . Sinus problem   . Sprain 10/25/2010   left knee  . Trigeminal pulse   . Trigger finger, right    Middle finger and right wrist   . Vitamin D deficiency     Past Surgical History:  Procedure Laterality Date  . ABDOMINAL HYSTERECTOMY    . APPENDECTOMY    . CHOLECYSTECTOMY    . DIAGNOSTIC LAPAROSCOPY  08/2006  . DILATION AND CURETTAGE OF UTERUS  2011   attempted ablation  . FUNCTIONAL ENDOSCOPIC SINUS SURGERY    . ganglion cyst removal    . HYSTEROSCOPY     failed  . KNEE ARTHROPLASTY  Right 2018   meniscus  . LAPAROSCOPIC GASTRIC BAND REMOVAL WITH LAPAROSCOPIC GASTRIC SLEEVE RESECTION  02/02/2020   pt denies gastric band removal. Had gastric sleeve resection and gallbladder removal.  . LASIK    . LIPOMA EXCISION Right 07/22/2014   Procedure: EXCISION RIGHT THIGH LIPOMA;  Surgeon: Erroll Luna, MD;  Location: [place];  Service: General;  Laterality: Right;  . ROBOTIC ASSISTED TOTAL HYSTERECTOMY  11/06/10   TLH/RSO  . SALPINGOOPHORECTOMY  11/06/2010   Procedure: SALPINGO OOPHERECTOMY;  Surgeon: Felipa Emory;  Location: [facility];  Service: Gynecology;  Laterality: Right;  . TOTAL KNEE ARTHROPLASTY Left 05/23/2020   Procedure: LEFT TOTAL KNEE ARTHROPLASTY;  Surgeon: Leandrew Koyanagi, MD;  Location: [place];  Service: Orthopedics;  Laterality: Left;  . TRIGGER FINGER RELEASE    . UPPER GI ENDOSCOPY N/A 02/02/2020   Procedure: UPPER GI ENDOSCOPY;  Surgeon: [name], MD;  Location: [facility];  Service: General;  Laterality: N/A;    There were no vitals filed for this visit.   Subjective Assessment - 06/20/20 1533    Subjective [name] relays 4/10 overall knee pain, 7/10 overall  knee stiffness    Patient is accompained by: Family member    Pertinent History R knee scope 2018, Bariatric surgery, CHF    Limitations Sitting;House hold activities;Standing;Walking    How long can you sit comfortably? Stiffness with standing after prolonged sitting    How long can you stand comfortably? Short periods of time (non-specific)    How long can you walk comfortably? Short (household) distances with a cane    Patient Stated Goals Walk better without the cane for yard work and return to work    Pain Onset More than a month ago              [facility] PT Assessment - 06/20/20 0001      Assessment   Medical Diagnosis s/p L TKA    Referring Provider (PT) Frankey Shown MD    Onset Date/Surgical Date 05/23/20      ROM / Strength   AROM / PROM / Strength PROM      AROM    Left Knee Extension -10    Left Knee Flexion 95      PROM   PROM Assessment Site Knee    Right/Left Knee Left    Left Knee Extension 5    Left Knee Flexion 104                         [facility] Adult PT Treatment/Exercise - 06/20/20 0001      Ambulation/Gait   Gait Comments [name] is using a cane inside and outside the home      Knee/Hip Exercises: Stretches   Knee: Self-Stretch Limitations seated tailgate stretch 10 sec X 3 min      Knee/Hip Exercises: Aerobic   Nustep L5 x10 min UE/LE      Knee/Hip Exercises: Standing   Hip Abduction Both;2 sets;10 reps    Abduction Limitations 3# on Lt      Knee/Hip Exercises: Seated   Long Arc Quad Left;3 sets;10 reps    Long Arc Quad Weight 3 lbs.    Other Seated Knee/Hip Exercises SLR 2X10 on Lt      Knee/Hip Exercises: Supine   Heel Slides AAROM;Left;15 reps    Heel Slides Limitations 5 sec hold      Modalities   Modalities Cryotherapy      Cryotherapy   Number Minutes Cryotherapy 10 Minutes   vasopnuematic unavailable   Cryotherapy Location Knee    Type of Cryotherapy Ice pack      Manual Therapy   Manual therapy comments Lt knee PROM flexion and extension, Lt knee flexion mobs, patella mobs to tolerance                    PT Short Term Goals - 06/17/20 0957      PT SHORT TERM GOAL #1   Title [name] will report independence and compliance with her starter HEP addressing AROM and quadriceps strength impairments.    Time 2    Period Weeks    Status Achieved    Target Date 06/29/20             PT Long Term Goals - 06/17/20 0957      PT LONG TERM GOAL #1   Title Improve FOTO score to 59.    Baseline 35    Time 8    Period Weeks    Status On-going      PT LONG TERM GOAL #2   Title Improve  L knee pain to consistently 0-3/10 on the Numeric Pain Rating Scale.    Baseline Can be 5+/10    Time 8    Period Weeks    Status On-going      PT LONG TERM GOAL #3   Title [name] will be able to walk  1/4 mile or more from her car to her office without an assistive device or complaints of functional difficulty.    Baseline Unable with cane.    Time 8    Period Weeks    Status On-going      PT LONG TERM GOAL #4   Title Improve L knee AROM for extension to -3 degrees or better and flexion to 110 degrees or better by DC.    Baseline -15 and 90, respectively    Time 8    Period Weeks    Status On-going      PT LONG TERM GOAL #5   Title [name] will be independent and compliant with her long-term HEP at DC.    Time 8    Period Weeks    Status On-going                 Plan - 06/20/20 1535    Clinical Impression Statement She showed improvments in overall Lt knee ROM and is making good early progress but still with deficits and will continue to benefit from PT.    Personal Factors and Comorbidities Comorbidity 3+    Comorbidities Previous R knee scope in 2018, bariatric surgery, CHF    Examination-Activity Limitations Bathing;Dressing;Sit;Transfers;Bed Mobility;Sleep;Bend;Lift;Squat;Locomotion Level;Stairs;Carry;Stand    Examination-Participation Restrictions Interpersonal Relationship;Occupation;Yard Work;Community Activity    Stability/Clinical Decision Making Stable/Uncomplicated    Rehab Potential Good    PT Frequency 3x / week    PT Duration 8 weeks    PT Treatment/Interventions ADLs/Self Care Home Management;Electrical Stimulation;Cryotherapy;Therapeutic activities;Stair training;Gait training;Therapeutic exercise;Balance training;Neuromuscular re-education;Patient/family education;Manual techniques;Vasopneumatic Device    PT Next Visit Plan Progress quadriceps strength, possibly stairs and balance starter activities, leg press, knee extension machine, vaso    PT Home Exercise Plan Access Code: [email or web address]    Consulted and Agree with Plan of Care Patient;Family member/caregiver    Family Member Consulted Husband           Patient will benefit from skilled therapeutic  intervention in order to improve the following deficits and impairments:  Abnormal gait,Decreased activity tolerance,Decreased balance,Decreased endurance,Decreased range of motion,Decreased strength,Difficulty walking,Increased edema,Impaired flexibility,Pain,Obesity  Visit Diagnosis: Difficulty walking  Muscle weakness (generalized)  Stiffness of left knee, not elsewhere classified  Chronic pain of left knee  Localized edema     Problem List Patient Active Problem List   Diagnosis Date Noted  . Status post total left knee replacement 05/23/2020  . Primary osteoarthritis of left knee 05/05/2020  . 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 Fatty liver disease, nonalcoholic 25/36/6440  . 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  . Trigger finger, right middle finger 10/10/2018  . Other insomnia 06/23/2018  . Vitamin D deficiency 03/26/2018  . Prediabetes 03/11/2018  . Obesity 02/26/2018  . Migraines 10/01/2016  . Decreased cardiac ejection fraction 10/01/2016  . Severe obesity ([place]) 10/01/2016  . Shingles 08/03/2016  . Chronic pain of right knee 05/24/2016  . Unilateral primary osteoarthritis, right knee 05/24/2016  . Posterior tibial tendinitis, right leg 01/30/2016  . Lichen sclerosus 34/74/2595    Silvestre Mesi 06/20/2020, 3:36 PM  [facility] [address] [place], [place], [address] Phone: [phone]  Fax:  [phone]  Name: GAYNELLE BELO MRN: [id number] Date of Birth: 1960-10-16

## 2020-06-21 ENCOUNTER — Encounter: Payer: BC Managed Care – PPO | Attending: General Surgery | Admitting: Skilled Nursing Facility1

## 2020-06-21 ENCOUNTER — Other Ambulatory Visit: Payer: Self-pay

## 2020-06-21 DIAGNOSIS — E66813 Obesity, class 3: Secondary | ICD-10-CM

## 2020-06-22 ENCOUNTER — Ambulatory Visit (INDEPENDENT_AMBULATORY_CARE_PROVIDER_SITE_OTHER): Payer: BC Managed Care – PPO | Admitting: Physical Therapy

## 2020-06-22 DIAGNOSIS — M25562 Pain in left knee: Secondary | ICD-10-CM | POA: Diagnosis not present

## 2020-06-22 DIAGNOSIS — M6281 Muscle weakness (generalized): Secondary | ICD-10-CM

## 2020-06-22 DIAGNOSIS — M25662 Stiffness of left knee, not elsewhere classified: Secondary | ICD-10-CM | POA: Diagnosis not present

## 2020-06-22 DIAGNOSIS — R600 Localized edema: Secondary | ICD-10-CM

## 2020-06-22 DIAGNOSIS — R262 Difficulty in walking, not elsewhere classified: Secondary | ICD-10-CM

## 2020-06-22 DIAGNOSIS — G8929 Other chronic pain: Secondary | ICD-10-CM

## 2020-06-22 NOTE — Therapy (Signed)
[place], [place], [address] Phone: [phone]   Fax:  [phone]  Physical Therapy Treatment  Patient Details  Name: Savannah [name] MRN: [id number] Date of Birth: Jan 05, 1961 Referring Provider (PT): Frankey Shown MD   Encounter Date: 06/22/2020   PT End of Session - 06/22/20 1504    Visit Number 4    Number of Visits 24    PT Start Time 0938    PT Stop Time 1829    PT Time Calculation (min) 46 min    Activity Tolerance Patient tolerated treatment well;No increased pain;Patient limited by fatigue    Behavior During Therapy [facility] for tasks assessed/performed           Past Medical History:  Diagnosis Date  . Arthritis    bilateral knees, recent LCL sprain-on left knee  . CHF (congestive heart failure) ([place])   . Dysrhythmia    trigem  . Environmental allergies   . Family history of adverse reaction to anesthesia    slow to wake up  . Fatty liver   . Headache(784.0)    migraines-on meds for control, relpax, ketoprophen, vicoden, muscle relaxer  . Hearing loss 2016   High frequency hearing - Bilateral   . History of hiatal hernia   . Hot flashes   . Joint pain   . Lichen sclerosus   . Lipoma    Right Knee  . Menorrhagia   . Migraines   . Necrobiosis lipoidica   . Osteoarthritis   . Overweight   . Pelvic pain in female   . PONV (postoperative nausea and vomiting)    ponv, slow to awake  . Seasonal allergies   . Sinus problem   . Sprain 10/25/2010   left knee  . Trigeminal pulse   . Trigger finger, right    Middle finger and right wrist   . Vitamin D deficiency     Past Surgical History:  Procedure Laterality Date  . ABDOMINAL HYSTERECTOMY    . APPENDECTOMY    . CHOLECYSTECTOMY    . DIAGNOSTIC LAPAROSCOPY  08/2006  . DILATION AND CURETTAGE OF UTERUS  2011   attempted ablation  . FUNCTIONAL ENDOSCOPIC SINUS SURGERY    . ganglion cyst removal    . HYSTEROSCOPY     failed  . KNEE ARTHROPLASTY  Right 2018   meniscus  . LAPAROSCOPIC GASTRIC BAND REMOVAL WITH LAPAROSCOPIC GASTRIC SLEEVE RESECTION  02/02/2020   pt denies gastric band removal. Had gastric sleeve resection and gallbladder removal.  . LASIK    . LIPOMA EXCISION Right 07/22/2014   Procedure: EXCISION RIGHT THIGH LIPOMA;  Surgeon: Erroll Luna, MD;  Location: [place];  Service: General;  Laterality: Right;  . ROBOTIC ASSISTED TOTAL HYSTERECTOMY  11/06/10   TLH/RSO  . SALPINGOOPHORECTOMY  11/06/2010   Procedure: SALPINGO OOPHERECTOMY;  Surgeon: Felipa Emory;  Location: [facility];  Service: Gynecology;  Laterality: Right;  . TOTAL KNEE ARTHROPLASTY Left 05/23/2020   Procedure: LEFT TOTAL KNEE ARTHROPLASTY;  Surgeon: Leandrew Koyanagi, MD;  Location: [facility];  Service: Orthopedics;  Laterality: Left;  . TRIGGER FINGER RELEASE    . UPPER GI ENDOSCOPY N/A 02/02/2020   Procedure: UPPER GI ENDOSCOPY;  Surgeon: [name], MD;  Location: [facility];  Service: General;  Laterality: N/A;    There were no vitals filed for this visit.   Subjective Assessment - 06/22/20 1434    Subjective [name] relays her extension seems to be coming  along better than then flexion but she has been working hard at home with HEP.    Patient is accompained by: Family member    Pertinent History R knee scope 2018, Bariatric surgery, CHF    Limitations Sitting;House hold activities;Standing;Walking    How long can you sit comfortably? Stiffness with standing after prolonged sitting    How long can you stand comfortably? Short periods of time (non-specific)    How long can you walk comfortably? Short (household) distances with a cane    Patient Stated Goals Walk better without the cane for yard work and return to work    Pain Onset More than a month ago            [facility] Adult PT Treatment/Exercise - 06/22/20 0001      Knee/Hip Exercises: Stretches   Knee: Self-Stretch Limitations seated tailgate stretch 10 sec X 10    Gastroc Stretch  Both;3 reps;30 seconds      Knee/Hip Exercises: Aerobic   Nustep L5 x10 min UE/LE      Knee/Hip Exercises: Machines for Strengthening   Total Gym Leg Press 81# 2 sets of 15 slow eccentrics B legs, then 37# 2X15 Lt only      Knee/Hip Exercises: Standing   Forward Step Up Left;2 sets;10 reps;Hand Hold: 1;Step Height: 4"      Modalities   Modalities Vasopneumatic      Vasopneumatic   Number Minutes Vasopneumatic  10 minutes    Vasopnuematic Location  Knee    Vasopneumatic Pressure Medium    Vasopneumatic Temperature  34      Manual Therapy   Manual therapy comments Lt knee PROM flexion and extension, Lt knee flexion mobs, patella mobs to tolerance                    PT Short Term Goals - 06/17/20 0957      PT SHORT TERM GOAL #1   Title [name] will report independence and compliance with her starter HEP addressing AROM and quadriceps strength impairments.    Time 2    Period Weeks    Status Achieved    Target Date 06/29/20             PT Long Term Goals - 06/17/20 0957      PT LONG TERM GOAL #1   Title Improve FOTO score to 59.    Baseline 35    Time 8    Period Weeks    Status On-going      PT LONG TERM GOAL #2   Title Improve L knee pain to consistently 0-3/10 on the Numeric Pain Rating Scale.    Baseline Can be 5+/10    Time 8    Period Weeks    Status On-going      PT LONG TERM GOAL #3   Title [name] will be able to walk 1/4 mile or more from her car to her office without an assistive device or complaints of functional difficulty.    Baseline Unable with cane.    Time 8    Period Weeks    Status On-going      PT LONG TERM GOAL #4   Title Improve L knee AROM for extension to -3 degrees or better and flexion to 110 degrees or better by DC.    Baseline -15 and 90, respectively    Time 8    Period Weeks    Status On-going      PT  LONG TERM GOAL #5   Title [name] will be independent and compliant with her long-term HEP at DC.    Time 8     Period Weeks    Status On-going                 Plan - 06/22/20 1505    Clinical Impression Statement She had noticable improvements in Lt knee extension ROM but still with mild deficit with this and moderate deficit in knee flexion ROM. We continued to work on ROM and knee strength during session with good overall tolerance. Used Vaso at end to decrease pain, edema, and soreness. Continue POC.    Personal Factors and Comorbidities Comorbidity 3+    Comorbidities Previous R knee scope in 2018, bariatric surgery, CHF    Examination-Activity Limitations Bathing;Dressing;Sit;Transfers;Bed Mobility;Sleep;Bend;Lift;Squat;Locomotion Level;Stairs;Carry;Stand    Examination-Participation Restrictions Interpersonal Relationship;Occupation;Yard Work;Community Activity    Stability/Clinical Decision Making Stable/Uncomplicated    Rehab Potential Good    PT Frequency 3x / week    PT Duration 8 weeks    PT Treatment/Interventions ADLs/Self Care Home Management;Electrical Stimulation;Cryotherapy;Therapeutic activities;Stair training;Gait training;Therapeutic exercise;Balance training;Neuromuscular re-education;Patient/family education;Manual techniques;Vasopneumatic Device    PT Next Visit Plan Progress quadriceps strength, possibly stairs and balance starter activities, leg press, knee extension machine, vaso    PT Home Exercise Plan Access Code: [email or web address]    Consulted and Agree with Plan of Care Patient;Family member/caregiver    Family Member Consulted Husband           Patient will benefit from skilled therapeutic intervention in order to improve the following deficits and impairments:  Abnormal gait,Decreased activity tolerance,Decreased balance,Decreased endurance,Decreased range of motion,Decreased strength,Difficulty walking,Increased edema,Impaired flexibility,Pain,Obesity  Visit Diagnosis: Difficulty walking  Muscle weakness (generalized)  Stiffness of left knee, not elsewhere  classified  Chronic pain of left knee  Localized edema     Problem List Patient Active Problem List   Diagnosis Date Noted  . Status post total left knee replacement 05/23/2020  . Primary osteoarthritis of left knee 05/05/2020  . 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 Fatty liver disease, nonalcoholic 03/47/4259  . 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  . Trigger finger, right middle finger 10/10/2018  . Other insomnia 06/23/2018  . Vitamin D deficiency 03/26/2018  . Prediabetes 03/11/2018  . Obesity 02/26/2018  . Migraines 10/01/2016  . Decreased cardiac ejection fraction 10/01/2016  . Severe obesity ([place]) 10/01/2016  . Shingles 08/03/2016  . Chronic pain of right knee 05/24/2016  . Unilateral primary osteoarthritis, right knee 05/24/2016  . Posterior tibial tendinitis, right leg 01/30/2016  . Lichen sclerosus 56/38/7564    Silvestre Mesi 06/22/2020, 3:07 PM  [facility] [address] [place], [place], [address] Phone: [phone]   Fax:  [phone]  Name: BRINAE WOODS MRN: [id number] Date of Birth: [birth year]

## 2020-06-23 ENCOUNTER — Other Ambulatory Visit: Payer: Self-pay

## 2020-06-23 ENCOUNTER — Encounter: Payer: Self-pay | Admitting: Rehabilitative and Restorative Service Providers"

## 2020-06-23 ENCOUNTER — Ambulatory Visit (INDEPENDENT_AMBULATORY_CARE_PROVIDER_SITE_OTHER): Payer: BC Managed Care – PPO | Admitting: Rehabilitative and Restorative Service Providers"

## 2020-06-23 DIAGNOSIS — M25662 Stiffness of left knee, not elsewhere classified: Secondary | ICD-10-CM | POA: Diagnosis not present

## 2020-06-23 DIAGNOSIS — M6281 Muscle weakness (generalized): Secondary | ICD-10-CM

## 2020-06-23 DIAGNOSIS — R600 Localized edema: Secondary | ICD-10-CM

## 2020-06-23 DIAGNOSIS — G8929 Other chronic pain: Secondary | ICD-10-CM

## 2020-06-23 DIAGNOSIS — R262 Difficulty in walking, not elsewhere classified: Secondary | ICD-10-CM | POA: Diagnosis not present

## 2020-06-23 DIAGNOSIS — M25562 Pain in left knee: Secondary | ICD-10-CM

## 2020-06-23 NOTE — Progress Notes (Signed)
   Medical Nutrition Therapy:  Appt start time: 6:00pm end time:  7:00pm  Primary concerns today: Post-operative Bariatric Surgery Nutrition Management 6 Month Post-Op Class  Anthropometrics  Surgery date: 02/02/2020 Surgery type: Sleeve [facility] weight at [facility]: 280 lb Weight today: pt declined   Body Composition Scale 02/16/2020 03/30/2020  Current Body Weight 258.1 243.8  Total Body Fat % 47 45.7  Visceral Fat 19 17  Fat-Free Mass % 52.9 54.2   Total Body Water % 40.9 41.6  Muscle-Mass lbs 30.6 30.5  BMI 42.6 40.2  Body Fat Displacement           Torso  lbs 75.2 69.1         Left Leg  lbs 15 13.8         Right Leg  lbs 15 13.8         Left Arm  lbs 7.5 6.9         Right Arm   lbs 7.5 6.9     Information Reviewed/ Discussed During Appointment: -Review of composition scale numbers -Fluid requirements (64-100 ounces) -Protein requirements (60-80g) -Strategies for tolerating diet -Advancement of diet to include Starchy vegetables -Barriers to inclusion of new foods -Inclusion of appropriate multivitamin and calcium supplements  -Exercise recommendations   Fluid intake: adequate   Medications: See List Supplementation: appropriate   Using straws: no Drinking while eating: no Having you been chewing well: yes Chewing/swallowing difficulties: no Changes in vision: no Changes to mood/headaches: no Hair loss/[place] to skin/Changes to nails: no Any difficulty focusing or concentrating: no Sweating: no Dizziness/Lightheaded: no Palpitations: no  Carbonated beverages: no N/V/D/C/GAS: no Abdominal Pain: no Dumping syndrome: no  Recent physical activity:  ADL's  Progress Towards Goal(s):  In Progress Teaching method utilized: Visual & Auditory  Demonstrated degree of understanding via: Teach Back  Readiness Level: Action Barriers to learning/adherence to lifestyle change: none identified  Handouts given during visit include:  Phase V diet Progression   Goals  Sheet  The Benefits of Exercise are endless.....  Support Group Topics   Teaching Method Utilized:  Visual Auditory Hands on  Demonstrated degree of understanding via:  Teach Back   Monitoring/Evaluation:  Dietary intake, exercise, and body weight. Follow up in 3 months for 9 month post-op visit.

## 2020-06-23 NOTE — Therapy (Signed)
[place], [place], [address] Phone: [phone]   Fax:  [phone]  Physical Therapy Treatment  Patient Details  Name: Savannah [name] MRN: [id number] Date of Birth: 07-May-1960 Referring Provider (PT): Frankey Shown MD   Encounter Date: 06/23/2020   PT End of Session - 06/23/20 0923    Visit Number 5    Number of Visits 24    PT Start Time 0845    PT Stop Time 0940    PT Time Calculation (min) 55 min    Activity Tolerance Patient tolerated treatment well;No increased pain;Patient limited by fatigue    Behavior During Therapy [facility] for tasks assessed/performed           Past Medical History:  Diagnosis Date  . Arthritis    bilateral knees, recent LCL sprain-on left knee  . CHF (congestive heart failure) ([place])   . Dysrhythmia    trigem  . Environmental allergies   . Family history of adverse reaction to anesthesia    slow to wake up  . Fatty liver   . Headache(784.0)    migraines-on meds for control, relpax, ketoprophen, vicoden, muscle relaxer  . Hearing loss 2016   High frequency hearing - Bilateral   . History of hiatal hernia   . Hot flashes   . Joint pain   . Lichen sclerosus   . Lipoma    Right Knee  . Menorrhagia   . Migraines   . Necrobiosis lipoidica   . Osteoarthritis   . Overweight   . Pelvic pain in female   . PONV (postoperative nausea and vomiting)    ponv, slow to awake  . Seasonal allergies   . Sinus problem   . Sprain 10/25/2010   left knee  . Trigeminal pulse   . Trigger finger, right    Middle finger and right wrist   . Vitamin D deficiency     Past Surgical History:  Procedure Laterality Date  . ABDOMINAL HYSTERECTOMY    . APPENDECTOMY    . CHOLECYSTECTOMY    . DIAGNOSTIC LAPAROSCOPY  08/2006  . DILATION AND CURETTAGE OF UTERUS  2011   attempted ablation  . FUNCTIONAL ENDOSCOPIC SINUS SURGERY    . ganglion cyst removal    . HYSTEROSCOPY     failed  . KNEE ARTHROPLASTY  Right 2018   meniscus  . LAPAROSCOPIC GASTRIC BAND REMOVAL WITH LAPAROSCOPIC GASTRIC SLEEVE RESECTION  02/02/2020   pt denies gastric band removal. Had gastric sleeve resection and gallbladder removal.  . LASIK    . LIPOMA EXCISION Right 07/22/2014   Procedure: EXCISION RIGHT THIGH LIPOMA;  Surgeon: Erroll Luna, MD;  Location: [facility];  Service: General;  Laterality: Right;  . ROBOTIC ASSISTED TOTAL HYSTERECTOMY  11/06/10   TLH/RSO  . SALPINGOOPHORECTOMY  11/06/2010   Procedure: SALPINGO OOPHERECTOMY;  Surgeon: Felipa Emory;  Location: [facility];  Service: Gynecology;  Laterality: Right;  . TOTAL KNEE ARTHROPLASTY Left 05/23/2020   Procedure: LEFT TOTAL KNEE ARTHROPLASTY;  Surgeon: Leandrew Koyanagi, MD;  Location: [facility];  Service: Orthopedics;  Laterality: Left;  . TRIGGER FINGER RELEASE    . UPPER GI ENDOSCOPY N/A 02/02/2020   Procedure: UPPER GI ENDOSCOPY;  Surgeon: [name], MD;  Location: [facility];  Service: General;  Laterality: N/A;    There were no vitals filed for this visit.   Subjective Assessment - 06/23/20 0850    Subjective Savannah [name] notes she only uses pain medication before  bed now.  She is also not using the cane inside the house.    Patient is accompained by: Family member    Pertinent History R knee scope 2018, Bariatric surgery, CHF    Limitations Sitting;House hold activities;Standing;Walking    How long can you sit comfortably? Stiffness with standing after prolonged sitting    How long can you stand comfortably? 10 minutes    How long can you walk comfortably? 15-20 minutes    Patient Stated Goals Walk better without the cane for yard work and return to work    Currently in Pain? 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    Multiple Pain Sites No              [facility] PT Assessment - 06/23/20 0001      ROM / Strength   AROM / PROM / Strength PROM      AROM   Left Knee Extension -8    Left Knee Flexion 99                         [facility] Adult PT Treatment/Exercise - 06/23/20 0001      Ambulation/Gait   Gait Comments Savannah [name] is using a cane outside the home only now      Exercises   Exercises Knee/Hip      Knee/Hip Exercises: Aerobic   Recumbent Bike Seat 6 for 10 minutes      Knee/Hip Exercises: Machines for Strengthening   Total Gym Leg Press 81# 2 sets of 15 slow eccentrics B legs      Knee/Hip Exercises: Seated   Other Seated Knee/Hip Exercises Tailgate knee flexion AROM 3 minutes      Knee/Hip Exercises: Supine   Quad Sets Strengthening;Both;Other (comment)    Quad Sets Limitations 5 minutes with 5 second holds    Heel Slides AAROM;Left;15 reps    Heel Slides Limitations 10 second hold with belt self-overpressure and straighten out with quadriceps sets in between reps      Modalities   Modalities Vasopneumatic      Vasopneumatic   Number Minutes Vasopneumatic  10 minutes    Vasopnuematic Location  Knee    Vasopneumatic Pressure Medium    Vasopneumatic Temperature  34                  PT Education - 06/23/20 0854    Education Details Reviewed HEP with verbal cues needed    Person(s) Educated Patient    Methods Explanation;Demonstration;Tactile cues;Verbal cues    Comprehension Verbal cues required;Returned demonstration;Need further instruction;Tactile cues required;Verbalized understanding            PT Short Term Goals - 06/23/20 0855      PT SHORT TERM GOAL #1   Title Savannah [name] will report independence and compliance with her starter HEP addressing AROM and quadriceps strength  impairments.    Time 2    Period Weeks  Status Achieved    Target Date 06/29/20             PT Long Term Goals - 06/23/20 0921      PT LONG TERM GOAL #1   Title Improve FOTO score to 59.    Baseline 35    Time 8    Period Weeks    Status On-going      PT LONG TERM GOAL #2   Title Improve L knee pain to consistently 0-3/10 on the Numeric Pain Rating Scale.    Baseline Can be 5+/10    Time 8    Period Weeks    Status On-going      PT LONG TERM GOAL #3   Title Savannah [name] will be able to walk 1/4 mile or more from her car to her office without an assistive device or complaints of functional difficulty.    Baseline Unable with cane.    Time 8    Period Weeks    Status On-going      PT LONG TERM GOAL #4   Title Improve L knee AROM for extension to -3 degrees or better and flexion to 110 degrees or better by DC.    Baseline -8 and 99, respectively    Time 8    Period Weeks    Status On-going      PT LONG TERM GOAL #5   Title Savannah [name] will be independent and compliant with her long-term HEP at DC.    Time 8    Period Weeks    Status On-going                 Plan - 06/23/20 0623    Clinical Impression Statement Savannah [name] is making very good objective progress with her post-surgical AROM.  She is no longer using the cane in the house and can usually get by with pain meds just before bedtime.  Focus remains extension AROM and strength along with knee flexion AROM.    Personal Factors and Comorbidities Comorbidity 3+    Comorbidities Previous R knee scope in 2018, bariatric surgery, CHF    Examination-Activity Limitations Bathing;Dressing;Sit;Transfers;Bed Mobility;Sleep;Bend;Lift;Squat;Locomotion Level;Stairs;Carry;Stand    Examination-Participation Restrictions Interpersonal Relationship;Occupation;Yard Work;Community Activity    Stability/Clinical Decision Making Stable/Uncomplicated    Rehab Potential Good    PT Frequency 3x / week    PT Duration 8 weeks    PT  Treatment/Interventions ADLs/Self Care Home Management;Electrical Stimulation;Cryotherapy;Therapeutic activities;Stair training;Gait training;Therapeutic exercise;Balance training;Neuromuscular re-education;Patient/family education;Manual techniques;Vasopneumatic Device    PT Next Visit Plan Progress quadriceps strength, possibly stairs and balance starter activities, leg press, knee extension machine, vaso    PT Home Exercise Plan Access Code: [email or web address]    Consulted and Agree with Plan of Care Patient;Family member/caregiver    Family Member Consulted Husband           Patient will benefit from skilled therapeutic intervention in order to improve the following deficits and impairments:  Abnormal gait,Decreased activity tolerance,Decreased balance,Decreased endurance,Decreased range of motion,Decreased strength,Difficulty walking,Increased edema,Impaired flexibility,Pain,Obesity  Visit Diagnosis: Difficulty walking  Muscle weakness (generalized)  Stiffness of left knee, not elsewhere classified  Chronic pain of left knee  Localized edema     Problem List Patient Active Problem List   Diagnosis Date Noted  . Status post total left knee replacement 05/23/2020  . Primary osteoarthritis of left knee 05/05/2020  . 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 Fatty liver disease, nonalcoholic 76/16/0737  . 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  .  Trigger finger, right middle finger 10/10/2018  . Other insomnia 06/23/2018  . Vitamin D deficiency 03/26/2018  . Prediabetes 03/11/2018  . Obesity 02/26/2018  . Migraines 10/01/2016  . Decreased cardiac ejection fraction 10/01/2016  . Severe obesity ([place]) 10/01/2016  . Shingles 08/03/2016  . Chronic pain of right knee 05/24/2016  . Unilateral primary osteoarthritis, right knee 05/24/2016  . Posterior tibial tendinitis, right leg 01/30/2016  . Lichen sclerosus 38/11/1749    Farley Ly PT, MPT 06/23/2020, 9:25  AM  [facility] Physical Therapy [address] [place], [place], [address] Phone: [phone]   Fax:  [phone]  Name: Savannah [name] MRN: [id number] Date of Birth: May 28, 1960

## 2020-06-28 ENCOUNTER — Ambulatory Visit (INDEPENDENT_AMBULATORY_CARE_PROVIDER_SITE_OTHER): Payer: BC Managed Care – PPO | Admitting: Physical Therapy

## 2020-06-28 ENCOUNTER — Other Ambulatory Visit: Payer: Self-pay | Admitting: Physician Assistant

## 2020-06-28 ENCOUNTER — Other Ambulatory Visit: Payer: Self-pay

## 2020-06-28 ENCOUNTER — Encounter: Payer: Self-pay | Admitting: Physical Therapy

## 2020-06-28 DIAGNOSIS — G8929 Other chronic pain: Secondary | ICD-10-CM

## 2020-06-28 DIAGNOSIS — M25662 Stiffness of left knee, not elsewhere classified: Secondary | ICD-10-CM | POA: Diagnosis not present

## 2020-06-28 DIAGNOSIS — M6281 Muscle weakness (generalized): Secondary | ICD-10-CM | POA: Diagnosis not present

## 2020-06-28 DIAGNOSIS — R600 Localized edema: Secondary | ICD-10-CM

## 2020-06-28 DIAGNOSIS — R262 Difficulty in walking, not elsewhere classified: Secondary | ICD-10-CM | POA: Diagnosis not present

## 2020-06-28 DIAGNOSIS — M25562 Pain in left knee: Secondary | ICD-10-CM | POA: Diagnosis not present

## 2020-06-28 NOTE — Therapy (Signed)
[place], [place], [address] Phone: [phone]   Fax:  [phone]  Physical Therapy Treatment  Patient Details  Name: Savannah [name] MRN: [id number] Date of Birth: 01/18/1961 Referring Provider (PT): Frankey Shown MD   Encounter Date: 06/28/2020   PT End of Session - 06/28/20 1558    Visit Number 6    Number of Visits 24    PT Start Time 3151    PT Stop Time 7616    PT Time Calculation (min) 45 min    Activity Tolerance Patient tolerated treatment well;No increased pain;Patient limited by fatigue    Behavior During Therapy [facility] for tasks assessed/performed           Past Medical History:  Diagnosis Date  . Arthritis    bilateral knees, recent LCL sprain-on left knee  . CHF (congestive heart failure) ([place])   . Dysrhythmia    trigem  . Environmental allergies   . Family history of adverse reaction to anesthesia    slow to wake up  . Fatty liver   . Headache(784.0)    migraines-on meds for control, relpax, ketoprophen, vicoden, muscle relaxer  . Hearing loss 2016   High frequency hearing - Bilateral   . History of hiatal hernia   . Hot flashes   . Joint pain   . Lichen sclerosus   . Lipoma    Right Knee  . Menorrhagia   . Migraines   . Necrobiosis lipoidica   . Osteoarthritis   . Overweight   . Pelvic pain in female   . PONV (postoperative nausea and vomiting)    ponv, slow to awake  . Seasonal allergies   . Sinus problem   . Sprain 10/25/2010   left knee  . Trigeminal pulse   . Trigger finger, right    Middle finger and right wrist   . Vitamin D deficiency     Past Surgical History:  Procedure Laterality Date  . ABDOMINAL HYSTERECTOMY    . APPENDECTOMY    . CHOLECYSTECTOMY    . DIAGNOSTIC LAPAROSCOPY  08/2006  . DILATION AND CURETTAGE OF UTERUS  2011   attempted ablation  . FUNCTIONAL ENDOSCOPIC SINUS SURGERY    . ganglion cyst removal    . HYSTEROSCOPY     failed  . KNEE ARTHROPLASTY  Right 2018   meniscus  . LAPAROSCOPIC GASTRIC BAND REMOVAL WITH LAPAROSCOPIC GASTRIC SLEEVE RESECTION  02/02/2020   pt denies gastric band removal. Had gastric sleeve resection and gallbladder removal.  . LASIK    . LIPOMA EXCISION Right 07/22/2014   Procedure: EXCISION RIGHT THIGH LIPOMA;  Surgeon: Erroll Luna, MD;  Location: [facility];  Service: General;  Laterality: Right;  . ROBOTIC ASSISTED TOTAL HYSTERECTOMY  11/06/10   TLH/RSO  . SALPINGOOPHORECTOMY  11/06/2010   Procedure: SALPINGO OOPHERECTOMY;  Surgeon: Felipa Emory;  Location: [facility];  Service: Gynecology;  Laterality: Right;  . TOTAL KNEE ARTHROPLASTY Left 05/23/2020   Procedure: LEFT TOTAL KNEE ARTHROPLASTY;  Surgeon: Leandrew Koyanagi, MD;  Location: [facility];  Service: Orthopedics;  Laterality: Left;  . TRIGGER FINGER RELEASE    . UPPER GI ENDOSCOPY N/A 02/02/2020   Procedure: UPPER GI ENDOSCOPY;  Surgeon: [name], MD;  Location[facility];  Service: General;  Laterality: N/A;    There were no vitals filed for this visit.   Subjective Assessment - 06/28/20 1612    Subjective Pt reporting increased swelling and stiffness today.  Pertinent History R knee scope 2018, Bariatric surgery, CHF    Limitations Sitting;House hold activities;Standing;Walking    How long can you sit comfortably? Stiffness with standing after prolonged sitting    How long can you stand comfortably? 10 minutes    How long can you walk comfortably? 15-20 minutes    Patient Stated Goals Walk better without the cane for yard work and return to work    Currently in Pain? Yes    Pain Score 4     Pain Location Knee    Pain Orientation Left    Pain Descriptors / Indicators Aching;Sore;Tightness    Pain Type Surgical pain    Pain Onset More than a month ago                             [facility] Adult PT Treatment/Exercise - 06/28/20 0001      Ambulation/Gait   Gait Comments No assistive device unless in community level  surfaces.      Exercises   Exercises Knee/Hip      Knee/Hip Exercises: Aerobic   Recumbent Bike Seat 6 for 10 minutes      Knee/Hip Exercises: Machines for Strengthening   Total Gym Leg Press 81# 2 sets of 15, 50# L LE only 2x10      Knee/Hip Exercises: Standing   Forward Step Up Left;15 reps    Forward Step Up Limitations single UE support    Other Standing Knee Exercises forward lunges on step using L LE x 15 holding 5 seconds      Knee/Hip Exercises: Seated   Long Arc Quad Strengthening;Left;20 reps    Long Arc Quad Weight 3 lbs.    Long Arc Quad Limitations holding 3 seconds each    Other Seated Knee/Hip Exercises SLR 2x10    Sit to [employer] without UE support;10 reps      Knee/Hip Exercises: Supine   Quad Sets Strengthening;Left;15 reps    Quad Sets Limitations 5 seconds    Heel Slides AAROM;Left;15 reps      Modalities   Modalities Vasopneumatic      Vasopneumatic   Number Minutes Vasopneumatic  10 minutes    Vasopnuematic Location  Knee    Vasopneumatic Pressure Medium    Vasopneumatic Temperature  34                    PT Short Term Goals - 06/28/20 1609      PT SHORT TERM GOAL #1   Title [name] will report independence and compliance with her starter HEP addressing AROM and quadriceps strength impairments.    Status Achieved             PT Long Term Goals - 06/28/20 1610      PT LONG TERM GOAL #1   Title Improve FOTO score to 59.    Status On-going      PT LONG TERM GOAL #2   Title Improve L knee pain to consistently 0-3/10 on the Numeric Pain Rating Scale.    Status On-going      PT LONG TERM GOAL #3   Title [name] will be able to walk 1/4 mile or more from her car to her office without an assistive device or complaints of functional difficulty.    Status On-going      PT LONG TERM GOAL #4   Title Improve L knee AROM for extension to -3 degrees or better  and flexion to 110 degrees or better by DC.    Baseline -8 to 100 degrees    Status  On-going      PT LONG TERM GOAL #5   Title [name] will be independent and compliant with her long-term HEP at DC.    Status On-going                 Plan - 06/28/20 1559    Clinical Impression Statement Pt arriving today concerned with L LE pain and swelling. Pt reporting she is only using her straight cane only on uneven community surfaces. Pt reported going to [employer] over the weekend and after about 45 minutes of being on her feet with using a cart for support she had to sit down. Pt tolerating exercises well. Pt with notable tightness in her left IT band and posterior, IASTM performed with good response. Continue to progress strength, ROM and functional mobility.    Personal Factors and Comorbidities Comorbidity 3+    Comorbidities Previous R knee scope in 2018, bariatric surgery, CHF    Examination-Activity Limitations Bathing;Dressing;Sit;Transfers;Bed Mobility;Sleep;Bend;Lift;Squat;Locomotion Level;Stairs;Carry;Stand    Examination-Participation Restrictions Interpersonal Relationship;Occupation;Yard Work;Community Activity    Stability/Clinical Decision Making Stable/Uncomplicated    Rehab Potential Good    PT Frequency 3x / week    PT Duration 8 weeks    PT Treatment/Interventions ADLs/Self Care Home Management;Electrical Stimulation;Cryotherapy;Therapeutic activities;Stair training;Gait training;Therapeutic exercise;Balance training;Neuromuscular re-education;Patient/family education;Manual techniques;Vasopneumatic Device    PT Next Visit Plan Progress quadriceps strength, possibly stairs and balance starter activities, leg press, knee extension machine, vaso    PT Home Exercise Plan Access Code: [email or web address]    Consulted and Agree with Plan of Care Patient;Family member/caregiver           Patient will benefit from skilled therapeutic intervention in order to improve the following deficits and impairments:  Abnormal gait,Decreased activity tolerance,Decreased  balance,Decreased endurance,Decreased range of motion,Decreased strength,Difficulty walking,Increased edema,Impaired flexibility,Pain,Obesity  Visit Diagnosis: Difficulty walking  Muscle weakness (generalized)  Stiffness of left knee, not elsewhere classified  Chronic pain of left knee  Localized edema     Problem List Patient Active Problem List   Diagnosis Date Noted  . Status post total left knee replacement 05/23/2020  . Primary osteoarthritis of left knee 05/05/2020  . 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 Fatty liver disease, nonalcoholic 64/33/2951  . 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  . Trigger finger, right middle finger 10/10/2018  . Other insomnia 06/23/2018  . Vitamin D deficiency 03/26/2018  . Prediabetes 03/11/2018  . Obesity 02/26/2018  . Migraines 10/01/2016  . Decreased cardiac ejection fraction 10/01/2016  . Severe obesity ([place]) 10/01/2016  . Shingles 08/03/2016  . Chronic pain of right knee 05/24/2016  . Unilateral primary osteoarthritis, right knee 05/24/2016  . Posterior tibial tendinitis, right leg 01/30/2016  . Lichen sclerosus 88/41/6606    Oretha Caprice, PT, MPT 06/28/2020, 4:13 PM  [facility] Physical Therapy [address] [place], [place], [address] Phone: [phone]   Fax:  [phone]  Name: Savannah [name] MRN: [id number] Date of Birth: 07-07-1960

## 2020-06-30 ENCOUNTER — Ambulatory Visit (INDEPENDENT_AMBULATORY_CARE_PROVIDER_SITE_OTHER): Payer: BC Managed Care – PPO | Admitting: Rehabilitative and Restorative Service Providers"

## 2020-06-30 ENCOUNTER — Other Ambulatory Visit: Payer: Self-pay | Admitting: Physician Assistant

## 2020-06-30 ENCOUNTER — Other Ambulatory Visit: Payer: Self-pay | Admitting: Neurology

## 2020-06-30 ENCOUNTER — Telehealth: Payer: Self-pay | Admitting: Physician Assistant

## 2020-06-30 ENCOUNTER — Other Ambulatory Visit: Payer: Self-pay

## 2020-06-30 ENCOUNTER — Encounter: Payer: Self-pay | Admitting: Rehabilitative and Restorative Service Providers"

## 2020-06-30 DIAGNOSIS — R262 Difficulty in walking, not elsewhere classified: Secondary | ICD-10-CM

## 2020-06-30 DIAGNOSIS — M6281 Muscle weakness (generalized): Secondary | ICD-10-CM | POA: Diagnosis not present

## 2020-06-30 DIAGNOSIS — M25562 Pain in left knee: Secondary | ICD-10-CM

## 2020-06-30 DIAGNOSIS — M25662 Stiffness of left knee, not elsewhere classified: Secondary | ICD-10-CM | POA: Diagnosis not present

## 2020-06-30 DIAGNOSIS — G8929 Other chronic pain: Secondary | ICD-10-CM

## 2020-06-30 DIAGNOSIS — R600 Localized edema: Secondary | ICD-10-CM

## 2020-06-30 MED ORDER — METHOCARBAMOL 500 MG PO TABS: 500.0000 mg | ORAL_TABLET | Freq: Two times a day (BID) | ORAL | 0 refills | Status: DC | PRN

## 2020-06-30 NOTE — Telephone Encounter (Signed)
Sent in

## 2020-06-30 NOTE — Telephone Encounter (Signed)
Pt would like you to refill methocarbamol (ROBAXIN) 500 MG tablet [[id number]]

## 2020-06-30 NOTE — Therapy (Signed)
[place], [place], [address] Phone: [phone]   Fax:  [phone]  Physical Therapy Treatment/Reassessment  Patient Details  Name: Savannah [name] MRN: [id number] Date of Birth: [birth year] Referring Provider (PT): Frankey Shown MD   Encounter Date: 06/30/2020   PT End of Session - 06/30/20 1137    Visit Number 7    Number of Visits 24    PT Start Time 1101    PT Stop Time 6301    PT Time Calculation (min) 55 min    Activity Tolerance Patient tolerated treatment well;No increased pain;Patient limited by fatigue    Behavior During Therapy [facility] for tasks assessed/performed           Past Medical History:  Diagnosis Date  . Arthritis    bilateral knees, recent LCL sprain-on left knee  . CHF (congestive heart failure) ([place])   . Dysrhythmia    trigem  . Environmental allergies   . Family history of adverse reaction to anesthesia    slow to wake up  . Fatty liver   . Headache(784.0)    migraines-on meds for control, relpax, ketoprophen, vicoden, muscle relaxer  . Hearing loss 2016   High frequency hearing - Bilateral   . History of hiatal hernia   . Hot flashes   . Joint pain   . Lichen sclerosus   . Lipoma    Right Knee  . Menorrhagia   . Migraines   . Necrobiosis lipoidica   . Osteoarthritis   . Overweight   . Pelvic pain in female   . PONV (postoperative nausea and vomiting)    ponv, slow to awake  . Seasonal allergies   . Sinus problem   . Sprain 10/25/2010   left knee  . Trigeminal pulse   . Trigger finger, right    Middle finger and right wrist   . Vitamin D deficiency     Past Surgical History:  Procedure Laterality Date  . ABDOMINAL HYSTERECTOMY    . APPENDECTOMY    . CHOLECYSTECTOMY    . DIAGNOSTIC LAPAROSCOPY  08/2006  . DILATION AND CURETTAGE OF UTERUS  2011   attempted ablation  . FUNCTIONAL ENDOSCOPIC SINUS SURGERY    . ganglion cyst removal    . HYSTEROSCOPY     failed  . KNEE  ARTHROPLASTY Right 2018   meniscus  . LAPAROSCOPIC GASTRIC BAND REMOVAL WITH LAPAROSCOPIC GASTRIC SLEEVE RESECTION  02/02/2020   pt denies gastric band removal. Had gastric sleeve resection and gallbladder removal.  . LASIK    . LIPOMA EXCISION Right 07/22/2014   Procedure: EXCISION RIGHT THIGH LIPOMA;  Surgeon: Erroll Luna, MD;  Location: [place];  Service: General;  Laterality: Right;  . ROBOTIC ASSISTED TOTAL HYSTERECTOMY  11/06/10   TLH/RSO  . SALPINGOOPHORECTOMY  11/06/2010   Procedure: SALPINGO OOPHERECTOMY;  Surgeon: Felipa Emory;  Location: [facility];  Service: Gynecology;  Laterality: Right;  . TOTAL KNEE ARTHROPLASTY Left 05/23/2020   Procedure: LEFT TOTAL KNEE ARTHROPLASTY;  Surgeon: Leandrew Koyanagi, MD;  Location: [facility];  Service: Orthopedics;  Laterality: Left;  . TRIGGER FINGER RELEASE    . UPPER GI ENDOSCOPY N/A 02/02/2020   Procedure: UPPER GI ENDOSCOPY;  Surgeon: [name], MD;  Location[facility];  Service: General;  Laterality: N/A;    There were no vitals filed for this visit.   Subjective Assessment - 06/30/20 1107    Subjective Savannah [name] reports better sleeping overall, although she overdid  some walking yesterday and didn't sleep well last night.    Pertinent History R knee scope 2018, Bariatric surgery, CHF    Limitations Sitting;House hold activities;Standing;Walking    How long can you sit comfortably? Stiffness with standing after prolonged sitting    How long can you stand comfortably? 10-15 minutes    How long can you walk comfortably? 20-25 minutes    Patient Stated Goals Walk better without the cane for yard work and return to work    Currently in Pain? 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    Multiple Pain Sites No              [facility] PT Assessment - 06/30/20 0001      AROM   Left Knee Extension -3    Left Knee Flexion 96                         [facility] Adult PT Treatment/Exercise - 06/30/20 0001      Ambulation/Gait   Gait Comments No AD      Exercises   Exercises Knee/Hip      Knee/Hip Exercises: Aerobic   Recumbent Bike Seat 6 for 10 minutes      Knee/Hip Exercises: Machines for Strengthening   Total Gym Leg Press 100# 2 sets of 15, 50# L LE only 2 sets of 15      Knee/Hip Exercises: Seated   Other Seated Knee/Hip Exercises Tailgate knee flexion AROM 3 minutes      Knee/Hip Exercises: Supine   Quad Sets Strengthening;Both;Other (comment);2 sets;10 reps;Limitations    Quad Sets Limitations 5 seconds    Heel Slides AAROM;Left;15 reps    Heel Slides Limitations 10 second holds with belt      Modalities   Modalities Vasopneumatic      Vasopneumatic   Number Minutes Vasopneumatic  10 minutes    Vasopnuematic Location  Knee    Vasopneumatic Pressure Medium    Vasopneumatic Temperature  34                  PT Education - 06/30/20 1132    Education Details Reviewed HEP with emphasis on flexion AROM and quadriceps strength.    Person(s) Educated Patient    Methods Explanation;Demonstration;Tactile cues;Verbal cues    Comprehension Verbal cues required;Need further instruction;Returned demonstration;Verbalized understanding;Tactile cues required            PT Short Term Goals - 06/30/20 1133      PT SHORT TERM GOAL #1   Title Savannah [name] will report independence and compliance with her starter HEP addressing AROM and quadriceps strength impairments.    Status Achieved             PT Long Term Goals - 06/30/20 1134      PT LONG TERM GOAL #1   Title Improve  FOTO score to 59.    Baseline 40 at 06/30/20 RA, 35 at evaluation  Time 8    Period Weeks    Status On-going    Target Date 08/10/20      PT LONG TERM GOAL #2   Title Improve L knee pain to consistently 0-3/10 on the Numeric Pain Rating Scale.    Baseline Can be 5+/10    Time 8    Status On-going    Target Date 08/10/20      PT LONG TERM GOAL #3   Title Savannah [name] will be able to walk 1/4 mile or more from her car to her office without an assistive device or complaints of functional difficulty.    Baseline Can walk but 1/4 mile still too much    Time 8    Period Weeks    Status On-going    Target Date 08/10/20      PT LONG TERM GOAL #4   Title Improve L knee AROM for extension to -3 degrees or better and flexion to 110 degrees or better by DC.    Baseline -3 to 96 degrees    Time 8    Period Weeks    Status On-going    Target Date 08/10/20      PT LONG TERM GOAL #5   Title Savannah [name] will be independent and compliant with her long-term HEP at DC.    Status On-going                 Plan - 06/30/20 1138    Clinical Impression Statement Savannah [name] is making excellent progress with her knee extension AROM.  Flexion AROM, quadriceps strength and edema will benefit from continued work.  Savannah [name] is motivated to improve and get back to work and the 1/4 mile walk required to get from her car to her office.    Personal Factors and Comorbidities Comorbidity 3+    Comorbidities Previous R knee scope in 2018, bariatric surgery, CHF    Examination-Activity Limitations Bathing;Dressing;Sit;Transfers;Bed Mobility;Sleep;Bend;Lift;Squat;Locomotion Level;Stairs;Carry;Stand    Examination-Participation Restrictions Interpersonal Relationship;Occupation;Yard Work;Community Activity    Stability/Clinical Decision Making Stable/Uncomplicated    Rehab Potential Good    PT Frequency 3x / week    PT Duration 8 weeks    PT Treatment/Interventions ADLs/Self Care Home Management;Electrical  Stimulation;Cryotherapy;Therapeutic activities;Stair training;Gait training;Therapeutic exercise;Balance training;Neuromuscular re-education;Patient/family education;Manual techniques;Vasopneumatic Device    PT Next Visit Plan Progress quadriceps strength, possibly stairs and balance starter activities, leg press, knee extension machine, vaso    PT Home Exercise Plan Access Code: [email or web address]    Consulted and Agree with Plan of Care Patient;Family member/caregiver           Patient will benefit from skilled therapeutic intervention in order to improve the following deficits and impairments:  Abnormal gait,Decreased activity tolerance,Decreased balance,Decreased endurance,Decreased range of motion,Decreased strength,Difficulty walking,Increased edema,Impaired flexibility,Pain,Obesity  Visit Diagnosis: Difficulty walking  Muscle weakness (generalized)  Stiffness of left knee, not elsewhere classified  Chronic pain of left knee  Localized edema     Problem List Patient Active Problem List   Diagnosis Date Noted  . Status post total left knee replacement 05/23/2020  . Primary osteoarthritis of left knee 05/05/2020  . 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 Fatty liver disease, nonalcoholic 41/74/0814  . 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  . Trigger finger, right middle finger 10/10/2018  . Other insomnia 06/23/2018  . Vitamin D deficiency 03/26/2018  . Prediabetes 03/11/2018  . Obesity 02/26/2018  . Migraines 10/01/2016  . Decreased cardiac ejection fraction 10/01/2016  . Severe obesity ([place]) 10/01/2016  .  Shingles 08/03/2016  . Chronic pain of right knee 05/24/2016  . Unilateral primary osteoarthritis, right knee 05/24/2016  . Posterior tibial tendinitis, right leg 01/30/2016  . Lichen sclerosus 70/26/3785    Farley Ly PT, MPT 06/30/2020, 11:48 AM  [facility] Physical Therapy [address] [place], [place], [address] Phone:  [phone]   Fax:  [phone]  Name: Savannah [name] MRN: [id number] Date of Birth: 05/09/1960

## 2020-07-01 NOTE — Telephone Encounter (Signed)
Called patient no answer. LMOM.   Rx sent into pharm.  

## 2020-07-06 ENCOUNTER — Ambulatory Visit (INDEPENDENT_AMBULATORY_CARE_PROVIDER_SITE_OTHER): Payer: BC Managed Care – PPO | Admitting: Orthopaedic Surgery

## 2020-07-06 ENCOUNTER — Encounter: Payer: Self-pay | Admitting: Physical Therapy

## 2020-07-06 ENCOUNTER — Ambulatory Visit (INDEPENDENT_AMBULATORY_CARE_PROVIDER_SITE_OTHER): Payer: BC Managed Care – PPO | Admitting: Physical Therapy

## 2020-07-06 ENCOUNTER — Other Ambulatory Visit: Payer: Self-pay

## 2020-07-06 ENCOUNTER — Ambulatory Visit (INDEPENDENT_AMBULATORY_CARE_PROVIDER_SITE_OTHER): Payer: BC Managed Care – PPO

## 2020-07-06 ENCOUNTER — Encounter: Payer: Self-pay | Admitting: Orthopaedic Surgery

## 2020-07-06 VITALS — Ht 65.0 in | Wt 229.0 lb

## 2020-07-06 DIAGNOSIS — R262 Difficulty in walking, not elsewhere classified: Secondary | ICD-10-CM

## 2020-07-06 DIAGNOSIS — Z96652 Presence of left artificial knee joint: Secondary | ICD-10-CM

## 2020-07-06 DIAGNOSIS — M25662 Stiffness of left knee, not elsewhere classified: Secondary | ICD-10-CM | POA: Diagnosis not present

## 2020-07-06 DIAGNOSIS — G8929 Other chronic pain: Secondary | ICD-10-CM

## 2020-07-06 DIAGNOSIS — M25562 Pain in left knee: Secondary | ICD-10-CM

## 2020-07-06 DIAGNOSIS — M6281 Muscle weakness (generalized): Secondary | ICD-10-CM

## 2020-07-06 DIAGNOSIS — R600 Localized edema: Secondary | ICD-10-CM

## 2020-07-06 MED ORDER — METHOCARBAMOL 500 MG PO TABS: 500.0000 mg | ORAL_TABLET | Freq: Two times a day (BID) | ORAL | 6 refills | Status: DC | PRN

## 2020-07-06 NOTE — Therapy (Signed)
[place], [place], [address] Phone: [phone]   Fax:  [phone]  Physical Therapy Treatment  Patient Details  Name: Savannah [name] MRN: [id number] Date of Birth: 1961-01-24 Referring Provider (PT): Frankey Shown MD   Encounter Date: 07/06/2020   PT End of Session - 07/06/20 0945    Visit Number 8    Number of Visits 24    PT Start Time 0845    PT Stop Time 0925    PT Time Calculation (min) 40 min    Activity Tolerance Patient tolerated treatment well;No increased pain    Behavior During Therapy WFL for tasks assessed/performed           Past Medical History:  Diagnosis Date  . Arthritis    bilateral knees, recent LCL sprain-on left knee  . CHF (congestive heart failure) ([place])   . Dysrhythmia    trigem  . Environmental allergies   . Family history of adverse reaction to anesthesia    slow to wake up  . Fatty liver   . Headache(784.0)    migraines-on meds for control, relpax, ketoprophen, vicoden, muscle relaxer  . Hearing loss 2016   High frequency hearing - Bilateral   . History of hiatal hernia   . Hot flashes   . Joint pain   . Lichen sclerosus   . Lipoma    Right Knee  . Menorrhagia   . Migraines   . Necrobiosis lipoidica   . Osteoarthritis   . Overweight   . Pelvic pain in female   . PONV (postoperative nausea and vomiting)    ponv, slow to awake  . Seasonal allergies   . Sinus problem   . Sprain 10/25/2010   left knee  . Trigeminal pulse   . Trigger finger, right    Middle finger and right wrist   . Vitamin D deficiency     Past Surgical History:  Procedure Laterality Date  . ABDOMINAL HYSTERECTOMY    . APPENDECTOMY    . CHOLECYSTECTOMY    . DIAGNOSTIC LAPAROSCOPY  08/2006  . DILATION AND CURETTAGE OF UTERUS  2011   attempted ablation  . FUNCTIONAL ENDOSCOPIC SINUS SURGERY    . ganglion cyst removal    . HYSTEROSCOPY     failed  . KNEE ARTHROPLASTY Right 2018   meniscus  .  LAPAROSCOPIC GASTRIC BAND REMOVAL WITH LAPAROSCOPIC GASTRIC SLEEVE RESECTION  02/02/2020   pt denies gastric band removal. Had gastric sleeve resection and gallbladder removal.  . LASIK    . LIPOMA EXCISION Right 07/22/2014   Procedure: EXCISION RIGHT THIGH LIPOMA;  Surgeon: Erroll Luna, MD;  Location: [facility];  Service: General;  Laterality: Right;  . ROBOTIC ASSISTED TOTAL HYSTERECTOMY  11/06/10   TLH/RSO  . SALPINGOOPHORECTOMY  11/06/2010   Procedure: SALPINGO OOPHERECTOMY;  Surgeon: Felipa Emory;  Location: [facility];  Service: Gynecology;  Laterality: Right;  . TOTAL KNEE ARTHROPLASTY Left 05/23/2020   Procedure: LEFT TOTAL KNEE ARTHROPLASTY;  Surgeon: Leandrew Koyanagi, MD;  Location: [facility];  Service: Orthopedics;  Laterality: Left;  . TRIGGER FINGER RELEASE    . UPPER GI ENDOSCOPY N/A 02/02/2020   Procedure: UPPER GI ENDOSCOPY;  Surgeon: [name], MD;  [facility];  Service: General;  Laterality: N/A;    There were no vitals filed for this visit.   Subjective Assessment - 07/06/20 0855    Subjective Savannah [name] reports MD did not have any concerns at Follow up,  she relays about 3/4 knee pain and stiffness.    Patient is accompained by: Family member    Pertinent History R knee scope 2018, Bariatric surgery, CHF    Limitations Sitting;House hold activities;Standing;Walking    How long can you sit comfortably? Stiffness with standing after prolonged sitting    How long can you stand comfortably? 10-15 minutes    How long can you walk comfortably? 20-25 minutes    Patient Stated Goals Walk better without the cane for yard work and return to work    Pain Onset More than a month ago                             [facility] Adult PT Treatment/Exercise - 07/06/20 0001      Knee/Hip Exercises: Stretches   Knee: Self-Stretch Limitations seated tailgate stretch 10 sec X 10, standing lunge stetch 5 sec X10    Gastroc Stretch Both;3 reps;30 seconds       Knee/Hip Exercises: Aerobic   Recumbent Bike Seat 6 for 10 minutes      Knee/Hip Exercises: Machines for Strengthening   Total Gym Leg Press 100# 2 sets of 15, 50# L LE only 2 sets of 15      Knee/Hip Exercises: Standing   Forward Step Up Left;15 reps;Step Height: 6"    Forward Step Up Limitations single UE support      Manual Therapy   Manual therapy comments Lt knee PROM flexion and extension, Lt knee flexion mobs, patella mobs, manual hamstring stretching to tolerance                    PT Short Term Goals - 06/30/20 1133      PT SHORT TERM GOAL #1   Title Savannah [name] will report independence and compliance with her starter HEP addressing AROM and quadriceps strength impairments.    Status Achieved             PT Long Term Goals - 06/30/20 1134      PT LONG TERM GOAL #1   Title Improve FOTO score to 59.    Baseline 40 at 06/30/20 RA, 35 at evaluation    Time 8    Period Weeks    Status On-going    Target Date 08/10/20      PT LONG TERM GOAL #2   Title Improve L knee pain to consistently 0-3/10 on the Numeric Pain Rating Scale.    Baseline Can be 5+/10    Time 8    Status On-going    Target Date 08/10/20      PT LONG TERM GOAL #3   Title Savannah [name] will be able to walk 1/4 mile or more from her car to her office without an assistive device or complaints of functional difficulty.    Baseline Can walk but 1/4 mile still too much    Time 8    Period Weeks    Status On-going    Target Date 08/10/20      PT LONG TERM GOAL #4   Title Improve L knee AROM for extension to -3 degrees or better and flexion to 110 degrees or better by DC.    Baseline -3 to 96 degrees    Time 8    Period Weeks    Status On-going    Target Date 08/10/20      PT LONG TERM GOAL #5   Title Savannah [name] will be  independent and compliant with her long-term HEP at DC.    Status On-going                 Plan - 07/06/20 0946    Clinical Impression Statement Focused on Lt knee ROM and  strength to her tolerance and she is progressing as expected.    Personal Factors and Comorbidities Comorbidity 3+    Comorbidities Previous R knee scope in 2018, bariatric surgery, CHF    Examination-Activity Limitations Bathing;Dressing;Sit;Transfers;Bed Mobility;Sleep;Bend;Lift;Squat;Locomotion Level;Stairs;Carry;Stand    Examination-Participation Restrictions Interpersonal Relationship;Occupation;Yard Work;Community Activity    Stability/Clinical Decision Making Stable/Uncomplicated    Rehab Potential Good    PT Frequency 3x / week    PT Duration 8 weeks    PT Treatment/Interventions ADLs/Self Care Home Management;Electrical Stimulation;Cryotherapy;Therapeutic activities;Stair training;Gait training;Therapeutic exercise;Balance training;Neuromuscular re-education;Patient/family education;Manual techniques;Vasopneumatic Device    PT Next Visit Plan knee ROM, Progress quadriceps strength, possibly stairs and balance starter activities, leg press, knee extension machine    PT Home Exercise Plan Access Code: [email or web address]    Consulted and Agree with Plan of Care Patient;Family member/caregiver           Patient will benefit from skilled therapeutic intervention in order to improve the following deficits and impairments:  Abnormal gait,Decreased activity tolerance,Decreased balance,Decreased endurance,Decreased range of motion,Decreased strength,Difficulty walking,Increased edema,Impaired flexibility,Pain,Obesity  Visit Diagnosis: Difficulty walking  Muscle weakness (generalized)  Stiffness of left knee, not elsewhere classified  Chronic pain of left knee  Localized edema     Problem List Patient Active Problem List   Diagnosis Date Noted  . Status post total left knee replacement 05/23/2020  . Primary osteoarthritis of left knee 05/05/2020  . 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 Fatty liver disease, nonalcoholic 61/51/8343  . Persistent vertigo of central origin  02/10/2019  . Trigger finger, right middle finger 10/10/2018  . Other insomnia 06/23/2018  . Vitamin D deficiency 03/26/2018  . Prediabetes 03/11/2018  . Obesity 02/26/2018  . Migraines 10/01/2016  . Decreased cardiac ejection fraction 10/01/2016  . Severe obesity ([place]) 10/01/2016  . Shingles 08/03/2016  . Chronic pain of right knee 05/24/2016  . Unilateral primary osteoarthritis, right knee 05/24/2016  . Posterior tibial tendinitis, right leg 01/30/2016  . Lichen sclerosus 73/57/8978    Savannah [name] 07/06/2020, 9:47 AM  [facility] Physical Therapy [address] [place], [place], [address] Phone: [phone]   Fax:  [phone]  Name: Savannah [name] MRN: [id number] Date of Birth: 29-Apr-1960

## 2020-07-06 NOTE — Progress Notes (Signed)
Post-Op Visit Note   Patient: Savannah [name]           Date of Birth: 09-11-1960           MRN: [id number] Visit Date: 07/06/2020 PCP: Lawerance Cruel, MD   Assessment & Plan:  Chief Complaint:  Chief Complaint  Patient presents with  . Left Knee - Follow-up    Left total knee arthroplasty 05/23/2020   Visit Diagnoses:  1. Hx of total knee replacement, left     Plan:   [name] is 6 weeks status post left total knee replacement.  She takes oxycodone about 2 times a week mainly with PT.  She is doing physical therapy 2-3 times a week.  Main complaint is swelling that is limiting some range of motion but she is able to get to greater than 95 degrees with exercises and physical therapy.  Left knee shows a healed surgical incision.  Range of motion is 0-95.  Stable to varus valgus.  X-rays unremarkable.  No signs of infection.  At this point I think it is fine for her to return back to work from home 2 to 4 hours a day until we see her back in another 6 weeks.  This will allow her to continue to do home exercises and make her physical therapy appointment.  DVT prophylaxis, dental prophylaxis, wound care instructions are reviewed today.  Recheck in 6 weeks.   Follow-Up Instructions: Return in about 6 weeks (around 08/17/2020).   Orders:  Orders Placed This Encounter  Procedures  . XR Knee 1-2 Views Left   No orders of the defined types were placed in this encounter.   Imaging: XR Knee 1-2 Views Left  Result Date: 07/06/2020 Stable total knee replacement in good alignment.    PMFS History: Patient Active Problem List   Diagnosis Date Noted  . Status post total left knee replacement 05/23/2020  . Primary osteoarthritis of left knee 05/05/2020  . 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 Fatty liver disease, nonalcoholic 67/20/9470  . 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  . Trigger finger, right middle finger 10/10/2018  . Other insomnia 06/23/2018  .  Vitamin D deficiency 03/26/2018  . Prediabetes 03/11/2018  . Obesity 02/26/2018  . Migraines 10/01/2016  . Decreased cardiac ejection fraction 10/01/2016  . Severe obesity ([place]) 10/01/2016  . Shingles 08/03/2016  . Chronic pain of right knee 05/24/2016  . Unilateral primary osteoarthritis, right knee 05/24/2016  . Posterior tibial tendinitis, right leg 01/30/2016  . Lichen sclerosus 96/28/3662   Past Medical History:  Diagnosis Date  . Arthritis    bilateral knees, recent LCL sprain-on left knee  . CHF (congestive heart failure) ([place])   . Dysrhythmia    trigem  . Environmental allergies   . Family history of adverse reaction to anesthesia    slow to wake up  . Fatty liver   . Headache(784.0)    migraines-on meds for control, relpax, ketoprophen, vicoden, muscle relaxer  . Hearing loss 2016   High frequency hearing - Bilateral   . History of hiatal hernia   . Hot flashes   . Joint pain   . Lichen sclerosus   . Lipoma    Right Knee  . Menorrhagia   . Migraines   . Necrobiosis lipoidica   . Osteoarthritis   . Overweight   . Pelvic pain in female   . PONV (postoperative nausea and vomiting)    ponv, slow to awake  .  Seasonal allergies   . Sinus problem   . Sprain 10/25/2010   left knee  . Trigeminal pulse   . Trigger finger, right    Middle finger and right wrist   . Vitamin D deficiency     Family History  Problem Relation Age of Onset  . Skin cancer Mother   . Migraines Mother   . High blood pressure Mother   . Heart disease Father   . High blood pressure Father   . High Cholesterol Father   . Breast cancer Maternal Aunt 70  . Breast cancer Paternal Aunt 17  . Migraines Brother     Past Surgical History:  Procedure Laterality Date  . ABDOMINAL HYSTERECTOMY    . APPENDECTOMY    . CHOLECYSTECTOMY    . DIAGNOSTIC LAPAROSCOPY  08/2006  . DILATION AND CURETTAGE OF UTERUS  2011   attempted ablation  . FUNCTIONAL ENDOSCOPIC SINUS SURGERY    . ganglion cyst  removal    . HYSTEROSCOPY     failed  . KNEE ARTHROPLASTY Right 2018   meniscus  . LAPAROSCOPIC GASTRIC BAND REMOVAL WITH LAPAROSCOPIC GASTRIC SLEEVE RESECTION  02/02/2020   pt denies gastric band removal. Had gastric sleeve resection and gallbladder removal.  . LASIK    . LIPOMA EXCISION Right 07/22/2014   Procedure: EXCISION RIGHT THIGH LIPOMA;  Surgeon: Erroll Luna, MD;  Location: [place];  Service: General;  Laterality: Right;  . ROBOTIC ASSISTED TOTAL HYSTERECTOMY  11/06/10   TLH/RSO  . SALPINGOOPHORECTOMY  11/06/2010   Procedure: SALPINGO OOPHERECTOMY;  Surgeon: Felipa Emory;  Location: [facility];  Service: Gynecology;  Laterality: Right;  . TOTAL KNEE ARTHROPLASTY Left 05/23/2020   Procedure: LEFT TOTAL KNEE ARTHROPLASTY;  Surgeon: Leandrew Koyanagi, MD;  Location: [place];  Service: Orthopedics;  Laterality: Left;  . TRIGGER FINGER RELEASE    . UPPER GI ENDOSCOPY N/A 02/02/2020   Procedure: UPPER GI ENDOSCOPY;  Surgeon: [name], MD;  Location[facility];  Service: General;  Laterality: N/A;   Social History   Occupational History  . Occupation: Administrator, [employer]  Tobacco Use  . Smoking status: Never Smoker  . Smokeless tobacco: Never Used  Vaping Use  . Vaping Use: Never used  Substance and Sexual Activity  . Alcohol use: No    Alcohol/week: 0.0 standard drinks  . Drug use: No  . Sexual activity: Yes    Birth control/protection: Surgical    Comment: TLH/RSO

## 2020-07-07 ENCOUNTER — Ambulatory Visit (INDEPENDENT_AMBULATORY_CARE_PROVIDER_SITE_OTHER): Payer: BC Managed Care – PPO | Admitting: Physical Therapy

## 2020-07-07 DIAGNOSIS — M25562 Pain in left knee: Secondary | ICD-10-CM | POA: Diagnosis not present

## 2020-07-07 DIAGNOSIS — M6281 Muscle weakness (generalized): Secondary | ICD-10-CM | POA: Diagnosis not present

## 2020-07-07 DIAGNOSIS — G8929 Other chronic pain: Secondary | ICD-10-CM

## 2020-07-07 DIAGNOSIS — M25662 Stiffness of left knee, not elsewhere classified: Secondary | ICD-10-CM | POA: Diagnosis not present

## 2020-07-07 DIAGNOSIS — R262 Difficulty in walking, not elsewhere classified: Secondary | ICD-10-CM | POA: Diagnosis not present

## 2020-07-07 DIAGNOSIS — R600 Localized edema: Secondary | ICD-10-CM

## 2020-07-07 NOTE — Therapy (Signed)
[place], [place], [address] Phone: [phone]   Fax:  [phone]  Physical Therapy Treatment  Patient Details  Name: Savannah [name] MRN: [id number] Date of Birth: Apr 02, 1960 Referring Provider (PT): Savannah Shown MD   Encounter Date: 07/07/2020   PT End of Session - 07/07/20 1047    Visit Number 9    Number of Visits 24    PT Start Time 0175    PT Stop Time 1100    PT Time Calculation (min) 45 min    Activity Tolerance Patient tolerated treatment well;No increased pain    Behavior During Therapy WFL for tasks assessed/performed           Past Medical History:  Diagnosis Date  . Arthritis    bilateral knees, recent LCL sprain-on left knee  . CHF (congestive heart failure) ([place])   . Dysrhythmia    trigem  . Environmental allergies   . Family history of adverse reaction to anesthesia    slow to wake up  . Fatty liver   . Headache(784.0)    migraines-on meds for control, relpax, ketoprophen, vicoden, muscle relaxer  . Hearing loss 2016   High frequency hearing - Bilateral   . History of hiatal hernia   . Hot flashes   . Joint pain   . Lichen sclerosus   . Lipoma    Right Knee  . Menorrhagia   . Migraines   . Necrobiosis lipoidica   . Osteoarthritis   . Overweight   . Pelvic pain in female   . PONV (postoperative nausea and vomiting)    ponv, slow to awake  . Seasonal allergies   . Sinus problem   . Sprain 10/25/2010   left knee  . Trigeminal pulse   . Trigger finger, right    Middle finger and right wrist   . Vitamin D deficiency     Past Surgical History:  Procedure Laterality Date  . ABDOMINAL HYSTERECTOMY    . APPENDECTOMY    . CHOLECYSTECTOMY    . DIAGNOSTIC LAPAROSCOPY  08/2006  . DILATION AND CURETTAGE OF UTERUS  2011   attempted ablation  . FUNCTIONAL ENDOSCOPIC SINUS SURGERY    . ganglion cyst removal    . HYSTEROSCOPY     failed  . KNEE ARTHROPLASTY Right 2018   meniscus  .  LAPAROSCOPIC GASTRIC BAND REMOVAL WITH LAPAROSCOPIC GASTRIC SLEEVE RESECTION  02/02/2020   pt denies gastric band removal. Had gastric sleeve resection and gallbladder removal.  . LASIK    . LIPOMA EXCISION Right 07/22/2014   Procedure: EXCISION RIGHT THIGH LIPOMA;  Surgeon: Erroll Luna, MD;  Location: [facility];  Service: General;  Laterality: Right;  . ROBOTIC ASSISTED TOTAL HYSTERECTOMY  11/06/10   TLH/RSO  . SALPINGOOPHORECTOMY  11/06/2010   Procedure: SALPINGO OOPHERECTOMY;  Surgeon: Felipa Emory;  Location: [facility];  Service: Gynecology;  Laterality: Right;  . TOTAL KNEE ARTHROPLASTY Left 05/23/2020   Procedure: LEFT TOTAL KNEE ARTHROPLASTY;  Surgeon: Leandrew Koyanagi, MD;  Location: [facility];  Service: Orthopedics;  Laterality: Left;  . TRIGGER FINGER RELEASE    . UPPER GI ENDOSCOPY N/A 02/02/2020   Procedure: UPPER GI ENDOSCOPY;  Surgeon: [name], MD;  Location: [facility];  Service: General;  Laterality: N/A;    There were no vitals filed for this visit.   Subjective Assessment - 07/07/20 1021    Subjective Savannah [name] reports not really having pain but stiffness in her knee  is her biggest complaint    Patient is accompained by: Family member    Pertinent History R knee scope 2018, Bariatric surgery, CHF    Limitations Sitting;House hold activities;Standing;Walking    How long can you sit comfortably? Stiffness with standing after prolonged sitting    How long can you stand comfortably? 10-15 minutes    How long can you walk comfortably? 20-25 minutes    Patient Stated Goals Walk better without the cane for yard work and return to work    Pain Onset More than a month ago              [facility] PT Assessment - 07/07/20 0001      Assessment   Medical Diagnosis s/p L TKA    Referring Provider (PT) Savannah Shown MD    Onset Date/Surgical Date 05/23/20      AROM   Left Knee Extension -4    Left Knee Flexion 100      PROM   Left Knee Flexion 104                          [facility] Adult PT Treatment/Exercise - 07/07/20 0001      Knee/Hip Exercises: Stretches   Active Hamstring Stretch Left;2 reps;30 seconds    Active Hamstring Stretch Limitations standing with Lt foot DF on treadmill    Knee: Self-Stretch Limitations seated tailgate stretch 10 sec X 10, standing lunge stetch 10 sec X10    Gastroc Stretch Both;3 reps;30 seconds      Knee/Hip Exercises: Aerobic   Nustep seat 7 L5 X10 min      Knee/Hip Exercises: Machines for Strengthening   Cybex Knee Extension 10# bilat 2X10, then 5# left leg eccentrics    Cybex Knee Flexion 25#bilat 2X10 then 15# Lt leg    Total Gym Leg Press 106# 2 sets of 15, 56# L LE only 2 sets of 15      Knee/Hip Exercises: Standing   Forward Step Up Left;Step Height: 6";10 reps    Forward Step Up Limitations single UE support      Manual Therapy   Manual therapy comments Lt knee PROM flexion and extension, Lt knee flexion mobs, patella mobs, manual hamstring stretching to tolerance                    PT Short Term Goals - 06/30/20 1133      PT SHORT TERM GOAL #1   Title Savannah [name] will report independence and compliance with her starter HEP addressing AROM and quadriceps strength impairments.    Status Achieved             PT Long Term Goals - 06/30/20 1134      PT LONG TERM GOAL #1   Title Improve FOTO score to 59.    Baseline 40 at 06/30/20 RA, 35 at evaluation    Time 8    Period Weeks    Status On-going    Target Date 08/10/20      PT LONG TERM GOAL #2   Title Improve L knee pain to consistently 0-3/10 on the Numeric Pain Rating Scale.    Baseline Can be 5+/10    Time 8    Status On-going    Target Date 08/10/20      PT LONG TERM GOAL #3   Title Savannah [name] will be able to walk 1/4 mile or more from her car to her office without  an assistive device or complaints of functional difficulty.    Baseline Can walk but 1/4 mile still too much    Time 8    Period Weeks    Status  On-going    Target Date 08/10/20      PT LONG TERM GOAL #4   Title Improve L knee AROM for extension to -3 degrees or better and flexion to 110 degrees or better by DC.    Baseline -3 to 96 degrees    Time 8    Period Weeks    Status On-going    Target Date 08/10/20      PT LONG TERM GOAL #5   Title Savannah [name] will be independent and compliant with her long-term HEP at DC.    Status On-going                 Plan - 07/07/20 1048    Clinical Impression Statement Her strength is progressing well and added more resistance machines today for knee strength. She does continue to struggle with knee stiffness. She will continue to benefit from more PT to maximize knee ROM and strength.    Personal Factors and Comorbidities Comorbidity 3+    Comorbidities Previous R knee scope in 2018, bariatric surgery, CHF    Examination-Activity Limitations Bathing;Dressing;Sit;Transfers;Bed Mobility;Sleep;Bend;Lift;Squat;Locomotion Level;Stairs;Carry;Stand    Examination-Participation Restrictions Interpersonal Relationship;Occupation;Yard Work;Community Activity    Stability/Clinical Decision Making Stable/Uncomplicated    Rehab Potential Good    PT Frequency 3x / week    PT Duration 8 weeks    PT Treatment/Interventions ADLs/Self Care Home Management;Electrical Stimulation;Cryotherapy;Therapeutic activities;Stair training;Gait training;Therapeutic exercise;Balance training;Neuromuscular re-education;Patient/family education;Manual techniques;Vasopneumatic Device    PT Next Visit Plan knee ROM, Progress quadriceps strength, possibly stairs and balance starter activities, leg press, knee extension machine    PT Home Exercise Plan Access Code: [email or web address]    Consulted and Agree with Plan of Care Patient;Family member/caregiver           Patient will benefit from skilled therapeutic intervention in order to improve the following deficits and impairments:  Abnormal gait,Decreased activity  tolerance,Decreased balance,Decreased endurance,Decreased range of motion,Decreased strength,Difficulty walking,Increased edema,Impaired flexibility,Pain,Obesity  Visit Diagnosis: Difficulty walking  Muscle weakness (generalized)  Stiffness of left knee, not elsewhere classified  Chronic pain of left knee  Localized edema     Problem List Patient Active Problem List   Diagnosis Date Noted  . Status post total left knee replacement 05/23/2020  . Primary osteoarthritis of left knee 05/05/2020  . 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 Fatty liver disease, nonalcoholic 78/67/6720  . 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  . Trigger finger, right middle finger 10/10/2018  . Other insomnia 06/23/2018  . Vitamin D deficiency 03/26/2018  . Prediabetes 03/11/2018  . Obesity 02/26/2018  . Migraines 10/01/2016  . Decreased cardiac ejection fraction 10/01/2016  . Severe obesity ([place]) 10/01/2016  . Shingles 08/03/2016  . Chronic pain of right knee 05/24/2016  . Unilateral primary osteoarthritis, right knee 05/24/2016  . Posterior tibial tendinitis, right leg 01/30/2016  . Lichen sclerosus 94/70/9628    Silvestre Mesi 07/07/2020, 11:01 AM  [facility] [address] [place], [place], [address] Phone: [phone]   Fax:  [phone]  Name: Savannah [name] MRN: [id number] Date of Birth: [birth year]

## 2020-07-08 ENCOUNTER — Other Ambulatory Visit: Payer: Self-pay

## 2020-07-08 ENCOUNTER — Encounter: Payer: Self-pay | Admitting: Rehabilitative and Restorative Service Providers"

## 2020-07-08 ENCOUNTER — Ambulatory Visit (INDEPENDENT_AMBULATORY_CARE_PROVIDER_SITE_OTHER): Payer: BC Managed Care – PPO | Admitting: Rehabilitative and Restorative Service Providers"

## 2020-07-08 DIAGNOSIS — M25662 Stiffness of left knee, not elsewhere classified: Secondary | ICD-10-CM

## 2020-07-08 DIAGNOSIS — M25562 Pain in left knee: Secondary | ICD-10-CM | POA: Diagnosis not present

## 2020-07-08 DIAGNOSIS — M6281 Muscle weakness (generalized): Secondary | ICD-10-CM

## 2020-07-08 DIAGNOSIS — R262 Difficulty in walking, not elsewhere classified: Secondary | ICD-10-CM | POA: Diagnosis not present

## 2020-07-08 DIAGNOSIS — R600 Localized edema: Secondary | ICD-10-CM

## 2020-07-08 DIAGNOSIS — G8929 Other chronic pain: Secondary | ICD-10-CM

## 2020-07-08 NOTE — Therapy (Signed)
[place], [place], [address] Phone: [phone]   Fax:  [phone]  Physical Therapy Treatment  Patient Details  Name: Savannah [name] MRN: [id number] Date of Birth: October 17, 1960 Referring Provider (PT): Frankey Shown MD   Encounter Date: 07/08/2020   PT End of Session - 07/08/20 1501    Visit Number 10    Number of Visits 24    PT Start Time 1430    PT Stop Time 1520    PT Time Calculation (min) 50 min    Activity Tolerance Patient tolerated treatment well;No increased pain    Behavior During Therapy WFL for tasks assessed/performed           Past Medical History:  Diagnosis Date  . Arthritis    bilateral knees, recent LCL sprain-on left knee  . CHF (congestive heart failure) ([place])   . Dysrhythmia    trigem  . Environmental allergies   . Family history of adverse reaction to anesthesia    slow to wake up  . Fatty liver   . Headache(784.0)    migraines-on meds for control, relpax, ketoprophen, vicoden, muscle relaxer  . Hearing loss 2016   High frequency hearing - Bilateral   . History of hiatal hernia   . Hot flashes   . Joint pain   . Lichen sclerosus   . Lipoma    Right Knee  . Menorrhagia   . Migraines   . Necrobiosis lipoidica   . Osteoarthritis   . Overweight   . Pelvic pain in female   . PONV (postoperative nausea and vomiting)    ponv, slow to awake  . Seasonal allergies   . Sinus problem   . Sprain 10/25/2010   left knee  . Trigeminal pulse   . Trigger finger, right    Middle finger and right wrist   . Vitamin D deficiency     Past Surgical History:  Procedure Laterality Date  . ABDOMINAL HYSTERECTOMY    . APPENDECTOMY    . CHOLECYSTECTOMY    . DIAGNOSTIC LAPAROSCOPY  08/2006  . DILATION AND CURETTAGE OF UTERUS  2011   attempted ablation  . FUNCTIONAL ENDOSCOPIC SINUS SURGERY    . ganglion cyst removal    . HYSTEROSCOPY     failed  . KNEE ARTHROPLASTY Right 2018   meniscus  .  LAPAROSCOPIC GASTRIC BAND REMOVAL WITH LAPAROSCOPIC GASTRIC SLEEVE RESECTION  02/02/2020   pt denies gastric band removal. Had gastric sleeve resection and gallbladder removal.  . LASIK    . LIPOMA EXCISION Right 07/22/2014   Procedure: EXCISION RIGHT THIGH LIPOMA;  Surgeon: Erroll Luna, MD;  Location: [facility];  Service: General;  Laterality: Right;  . ROBOTIC ASSISTED TOTAL HYSTERECTOMY  11/06/10   TLH/RSO  . SALPINGOOPHORECTOMY  11/06/2010   Procedure: SALPINGO OOPHERECTOMY;  Surgeon: Felipa Emory;  Location: [facility];  Service: Gynecology;  Laterality: Right;  . TOTAL KNEE ARTHROPLASTY Left 05/23/2020   Procedure: LEFT TOTAL KNEE ARTHROPLASTY;  Surgeon: Leandrew Koyanagi, MD;  Location: [facility];  Service: Orthopedics;  Laterality: Left;  . TRIGGER FINGER RELEASE    . UPPER GI ENDOSCOPY N/A 02/02/2020   Procedure: UPPER GI ENDOSCOPY;  Surgeon: [name], MD;  Location: [facility];  Service: General;  Laterality: N/A;    There were no vitals filed for this visit.   Subjective Assessment - 07/08/20 1435    Subjective Savannah [name] reports a good visit with Dr. Erlinda Hong this week.  Returning full extension AROM, improving her bend and improving strength are high priorities.    Patient is accompained by: Family member    Pertinent History R knee scope 2018, Bariatric surgery, CHF    Limitations Sitting;House hold activities;Standing;Walking    How long can you sit comfortably? Stiffness with standing after prolonged sitting    How long can you stand comfortably? 10-15 minutes    How long can you walk comfortably? 20-25 minutes    Patient Stated Goals Walk better without the cane for yard work and return to work    Currently in Pain? Yes    Pain Score 3     Pain Location Knee    Pain Orientation Left    Pain Descriptors / Indicators Aching;Sore;Tightness    Pain Type Chronic pain;Surgical pain    Pain Radiating Towards NA    Pain Onset More than a month ago    Pain Frequency Constant     Aggravating Factors  Prolonged activities (sit, stand, walk)    Pain Relieving Factors Ice, exercises, meds    Effect of Pain on Daily Activities Not back to full-time work    Multiple Pain Sites No                             [facility] Adult PT Treatment/Exercise - 07/08/20 0001      Therapeutic Activites    Therapeutic Activities ADL's    ADL's Step up and over 6 inch step slow eccentrics      Neuro Re-ed    Neuro Re-ed Details  Tandem balance eyes open/closed/open with head turning 3X 20 seconds and dynamic heel to toe balance      Exercises   Exercises Knee/Hip      Knee/Hip Exercises: Aerobic   Recumbent Bike Seat 6 for 10 minutes      Knee/Hip Exercises: Machines for Strengthening   Total Gym Leg Press 106# 2 sets of 15, 56# L LE only 2 sets of 15      Knee/Hip Exercises: Seated   Other Seated Knee/Hip Exercises --      Knee/Hip Exercises: Supine   Quad Sets --    Quad Sets Limitations --    Heel Slides --    Heel Slides Limitations --      Modalities   Modalities Vasopneumatic      Vasopneumatic   Number Minutes Vasopneumatic  10 minutes    Vasopnuematic Location  Knee    Vasopneumatic Pressure Medium    Vasopneumatic Temperature  34                  PT Education - 07/08/20 1458    Education Details Progressed functional work with steps and balance.  Focus on quadriceps strength and knee extension AROM with HEP.    Person(s) Educated Patient    Methods Explanation;Demonstration;Verbal cues    Comprehension Verbalized understanding;Returned demonstration;Verbal cues required;Need further instruction            PT Short Term Goals - 07/08/20 1500      PT SHORT TERM GOAL #1   Title Savannah [name] will report independence and compliance with her starter HEP addressing AROM and quadriceps strength impairments.    Status Achieved             PT Long Term Goals - 07/08/20 1500      PT LONG TERM GOAL #1   Title Improve FOTO score to 59.  Baseline 40 at 06/30/20 RA, 35 at evaluation    Time 8    Period Weeks    Status On-going      PT LONG TERM GOAL #2   Title Improve L knee pain to consistently 0-3/10 on the Numeric Pain Rating Scale.    Baseline Can be 5+/10    Time 8    Status On-going      PT LONG TERM GOAL #3   Title Savannah [name] will be able to walk 1/4 mile or more from her car to her office without an assistive device or complaints of functional difficulty.    Baseline Can walk but 1/4 mile still too much    Time 8    Period Weeks    Status On-going      PT LONG TERM GOAL #4   Title Improve L knee AROM for extension to -3 degrees or better and flexion to 110 degrees or better by DC.    Baseline -3 to 96 degrees    Time 8    Period Weeks    Status On-going      PT LONG TERM GOAL #5   Title Savannah [name] will be independent and compliant with her long-term HEP at DC.    Status On-going                 Plan - 07/08/20 1502    Clinical Impression Statement Progressed more functional activities like steps and started proprioception/balance activities today.  Emphasis remains extension AROM, strength and flexion AROM with Savannah [name]'s HEP.    Personal Factors and Comorbidities Comorbidity 3+    Comorbidities Previous R knee scope in 2018, bariatric surgery, CHF    Examination-Activity Limitations Bathing;Dressing;Sit;Transfers;Bed Mobility;Sleep;Bend;Lift;Squat;Locomotion Level;Stairs;Carry;Stand    Examination-Participation Restrictions Interpersonal Relationship;Occupation;Yard Work;Community Activity    Stability/Clinical Decision Making Stable/Uncomplicated    Rehab Potential Good    PT Frequency 3x / week    PT Duration 8 weeks    PT Treatment/Interventions ADLs/Self Care Home Management;Electrical Stimulation;Cryotherapy;Therapeutic activities;Stair training;Gait training;Therapeutic exercise;Balance training;Neuromuscular re-education;Patient/family education;Manual techniques;Vasopneumatic Device    PT Next  Visit Plan Knee ROM, Progress quadriceps strength, possibly stairs and balance starter activities, leg press, knee extension machine    PT Home Exercise Plan Access Code: [email or web address]    Consulted and Agree with Plan of Care Patient;Family member/caregiver           Patient will benefit from skilled therapeutic intervention in order to improve the following deficits and impairments:  Abnormal gait,Decreased activity tolerance,Decreased balance,Decreased endurance,Decreased range of motion,Decreased strength,Difficulty walking,Increased edema,Impaired flexibility,Pain,Obesity  Visit Diagnosis: Difficulty walking  Muscle weakness (generalized)  Stiffness of left knee, not elsewhere classified  Chronic pain of left knee  Localized edema     Problem List Patient Active Problem List   Diagnosis Date Noted  . Status post total left knee replacement 05/23/2020  . Primary osteoarthritis of left knee 05/05/2020  . 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 Fatty liver disease, nonalcoholic 98/12/9145  . 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  . Trigger finger, right middle finger 10/10/2018  . Other insomnia 06/23/2018  . Vitamin D deficiency 03/26/2018  . Prediabetes 03/11/2018  . Obesity 02/26/2018  . Migraines 10/01/2016  . Decreased cardiac ejection fraction 10/01/2016  . Severe obesity ([place]) 10/01/2016  . Shingles 08/03/2016  . Chronic pain of right knee 05/24/2016  . Unilateral primary osteoarthritis, right knee 05/24/2016  . Posterior tibial tendinitis, right leg 01/30/2016  . Lichen sclerosus 82/95/6213    Joie Bimler  [address] PT, MPT 07/08/2020, 3:06 PM  [facility] [address] [place], [place], [address] Phone: [phone]   Fax:  [phone]  Name: Savannah [name] MRN: [id number] Date of Birth: 11/04/1960

## 2020-07-12 ENCOUNTER — Encounter: Payer: Self-pay | Admitting: Physical Therapy

## 2020-07-12 ENCOUNTER — Ambulatory Visit (INDEPENDENT_AMBULATORY_CARE_PROVIDER_SITE_OTHER): Payer: BC Managed Care – PPO | Admitting: Physical Therapy

## 2020-07-12 DIAGNOSIS — R262 Difficulty in walking, not elsewhere classified: Secondary | ICD-10-CM

## 2020-07-12 DIAGNOSIS — M6281 Muscle weakness (generalized): Secondary | ICD-10-CM | POA: Diagnosis not present

## 2020-07-12 DIAGNOSIS — M25562 Pain in left knee: Secondary | ICD-10-CM

## 2020-07-12 DIAGNOSIS — M25662 Stiffness of left knee, not elsewhere classified: Secondary | ICD-10-CM

## 2020-07-12 DIAGNOSIS — G8929 Other chronic pain: Secondary | ICD-10-CM

## 2020-07-12 NOTE — Patient Instructions (Signed)
Access Code: [email or web address] URL: [email or web address] Date: 07/12/2020 Prepared by: Daleen Bo  Exercises Supine Quadricep Sets - 3-5 x daily - 7 x weekly - 3-5 sets - 10 reps - 5 second hold Standing Knee Flexion Stretch on Step - 2 x daily - 7 x weekly - 3 sets - 10 reps - 5 hold

## 2020-07-12 NOTE — Therapy (Signed)
[facility] [place], [place], [address] Phone: [phone]   Fax:  [phone]  Physical Therapy Treatment  Patient Details  Name: Savannah [name] MRN: [id number] Date of Birth: 01/29/1961 Referring Provider (PT): Frankey Shown MD   Encounter Date: 07/12/2020   PT End of Session - 07/12/20 1459    Visit Number 11   Pt elects to perform icing at home.   Number of Visits 24    PT Start Time 1430    PT Stop Time 8921    PT Time Calculation (min) 48 min    Activity Tolerance Patient tolerated treatment well;No increased pain    Behavior During Therapy WFL for tasks assessed/performed           Past Medical History:  Diagnosis Date  . Arthritis    bilateral knees, recent LCL sprain-on left knee  . CHF (congestive heart failure) ([place])   . Dysrhythmia    trigem  . Environmental allergies   . Family history of adverse reaction to anesthesia    slow to wake up  . Fatty liver   . Headache(784.0)    migraines-on meds for control, relpax, ketoprophen, vicoden, muscle relaxer  . Hearing loss 2016   High frequency hearing - Bilateral   . History of hiatal hernia   . Hot flashes   . Joint pain   . Lichen sclerosus   . Lipoma    Right Knee  . Menorrhagia   . Migraines   . Necrobiosis lipoidica   . Osteoarthritis   . Overweight   . Pelvic pain in female   . PONV (postoperative nausea and vomiting)    ponv, slow to awake  . Seasonal allergies   . Sinus problem   . Sprain 10/25/2010   left knee  . Trigeminal pulse   . Trigger finger, right    Middle finger and right wrist   . Vitamin D deficiency     Past Surgical History:  Procedure Laterality Date  . ABDOMINAL HYSTERECTOMY    . APPENDECTOMY    . CHOLECYSTECTOMY    . DIAGNOSTIC LAPAROSCOPY  08/2006  . DILATION AND CURETTAGE OF UTERUS  2011   attempted ablation  . FUNCTIONAL ENDOSCOPIC SINUS SURGERY    . ganglion cyst removal    . HYSTEROSCOPY     failed  . KNEE  ARTHROPLASTY Right 2018   meniscus  . LAPAROSCOPIC GASTRIC BAND REMOVAL WITH LAPAROSCOPIC GASTRIC SLEEVE RESECTION  02/02/2020   pt denies gastric band removal. Had gastric sleeve resection and gallbladder removal.  . LASIK    . LIPOMA EXCISION Right 07/22/2014   Procedure: EXCISION RIGHT THIGH LIPOMA;  Surgeon: Erroll Luna, MD;  Location: [facility];  Service: General;  Laterality: Right;  . ROBOTIC ASSISTED TOTAL HYSTERECTOMY  11/06/10   TLH/RSO  . SALPINGOOPHORECTOMY  11/06/2010   Procedure: SALPINGO OOPHERECTOMY;  Surgeon: Felipa Emory;  Location: [facility];  Service: Gynecology;  Laterality: Right;  . TOTAL KNEE ARTHROPLASTY Left 05/23/2020   Procedure: LEFT TOTAL KNEE ARTHROPLASTY;  Surgeon: Leandrew Koyanagi, MD;  Location: [facility];  Service: Orthopedics;  Laterality: Left;  . TRIGGER FINGER RELEASE    . UPPER GI ENDOSCOPY N/A 02/02/2020   Procedure: UPPER GI ENDOSCOPY;  Surgeon: [name], MD;  Location: [facility];  Service: General;  Laterality: N/A;    There were no vitals filed for this visit.   Subjective Assessment - 07/12/20 1430    Subjective Pt states that  she is doing well but very stiff today. She notices that there is some extra swelling and has no complaints with HEP.    Patient is accompained by: Family member    Pertinent History R knee scope 2018, Bariatric surgery, CHF    Limitations Sitting;House hold activities;Standing;Walking    How long can you sit comfortably? Stiffness with standing after prolonged sitting    How long can you stand comfortably? 10-15 minutes    How long can you walk comfortably? 20-25 minutes    Patient Stated Goals Walk better without the cane for yard work and return to work    Currently in Pain? Yes    Pain Score 3     Pain Location Knee    Pain Orientation Left    Pain Descriptors / Indicators Aching;Sore;Tightness    Pain Onset More than a month ago              [facility] PT Assessment - 07/12/20 0001      AROM    Left Knee Extension -10    Left Knee Flexion 100                         [facility] Adult PT Treatment/Exercise - 07/12/20 0001      Therapeutic Activites    Therapeutic Activities ADL's    ADL's Step up and over 6 inch step slow eccentrics; STS from regular chair height table 2x10      Neuro Re-ed    Neuro Re-ed Details  Tandem balance eyes open/closed  3X 20 seconds and tandem walking      Exercises   Exercises Knee/Hip      Knee/Hip Exercises: Aerobic   Recumbent Bike Seat 6 for 10 minutes               Knee/Hip Exercises: Standing   Other Standing Knee Exercises step flexion stretch 10s 10x      Knee/Hip Exercises: Supine   Other Supine Knee/Hip Exercises supine quad set with heel prop 10x 5s hold                                    Manual Therapy   Manual therapy comments Joint mob: TKE grade II-III with tibial ER; knee flexion tibial AP grade III                  PT Education - 07/12/20 1458    Education Details knee mechanics, anatomy, exercise progression, DOMS expectations, muscle firing, edema management, HEP, POC, gait mechanics   Person(s) Educated Patient    Methods Explanation;Demonstration;Tactile cues;Verbal cues    Comprehension Verbalized understanding;Returned demonstration;Verbal cues required;Tactile cues required            PT Short Term Goals - 07/08/20 1500      PT SHORT TERM GOAL #1   Title Savannah [name] will report independence and compliance with her starter HEP addressing AROM and quadriceps strength impairments.    Status Achieved             PT Long Term Goals - 07/08/20 1500      PT LONG TERM GOAL #1   Title Improve FOTO score to 59.    Baseline 40 at 06/30/20 RA, 35 at evaluation    Time 8    Period Weeks    Status On-going      PT LONG TERM GOAL #  2   Title Improve L knee pain to consistently 0-3/10 on the Numeric Pain Rating Scale.    Baseline Can be 5+/10    Time 8    Status On-going      PT LONG  TERM GOAL #3   Title Savannah [name] will be able to walk 1/4 mile or more from her car to her office without an assistive device or complaints of functional difficulty.    Baseline Can walk but 1/4 mile still too much    Time 8    Period Weeks    Status On-going      PT LONG TERM GOAL #4   Title Improve L knee AROM for extension to -3 degrees or better and flexion to 110 degrees or better by DC.    Baseline -3 to 96 degrees    Time 8    Period Weeks    Status On-going      PT LONG TERM GOAL #5   Title Savannah [name] will be independent and compliant with her long-term HEP at DC.    Status On-going                 Plan - 07/12/20 1516    Clinical Impression Statement Pt presents with increased swelling and stiffness with the L knee at today's session. Pt started the session with 100 deg of knee flexion and -10 deg of knee flexion that was improved to 105 and -4 deg of knee ext at the end of session. Pt responded welll to joint mobilization and ROM exercise. Pt was able to perform STS exercise to a standard height chair but required increased VC for equal WB and eccentric lowering. Pt's gait mechanics also improved with cuing for heel toe relationship. Plan to progress ROM  and strength as tolerated. Likely able to perform STS to lower chair height next session. Pt would benefit from continued skilled therapy in order to reach goals and maximize functional L LE strength and ROM for full return to PLOF.    Personal Factors and Comorbidities Comorbidity 3+    Comorbidities Previous R knee scope in 2018, bariatric surgery, CHF    Examination-Activity Limitations Bathing;Dressing;Sit;Transfers;Bed Mobility;Sleep;Bend;Lift;Squat;Locomotion Level;Stairs;Carry;Stand    Examination-Participation Restrictions Interpersonal Relationship;Occupation;Yard Work;Community Activity    Stability/Clinical Decision Making Stable/Uncomplicated    Rehab Potential Good    PT Frequency 3x / week    PT Duration 8 weeks     PT Treatment/Interventions ADLs/Self Care Home Management;Electrical Stimulation;Cryotherapy;Therapeutic activities;Stair training;Gait training;Therapeutic exercise;Balance training;Neuromuscular re-education;Patient/family education;Manual techniques;Vasopneumatic Device    PT Next Visit Plan Knee ROM, Progress quadriceps strength, leg press, knee extension machine, lateral step up, STS to lower height    PT Home Exercise Plan Access Code: [email or web address]    Consulted and Agree with Plan of Care Patient;Family member/caregiver           Patient will benefit from skilled therapeutic intervention in order to improve the following deficits and impairments:  Abnormal gait,Decreased activity tolerance,Decreased balance,Decreased endurance,Decreased range of motion,Decreased strength,Difficulty walking,Increased edema,Impaired flexibility,Pain,Obesity  Visit Diagnosis: Difficulty walking  Muscle weakness (generalized)  Stiffness of left knee, not elsewhere classified  Chronic pain of left knee     Problem List Patient Active Problem List   Diagnosis Date Noted  . Status post total left knee replacement 05/23/2020  . Primary osteoarthritis of left knee 05/05/2020  . 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 Fatty liver disease, nonalcoholic 21/19/4174  . 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  . Trigger finger, right  middle finger 10/10/2018  . Other insomnia 06/23/2018  . Vitamin D deficiency 03/26/2018  . Prediabetes 03/11/2018  . Obesity 02/26/2018  . Migraines 10/01/2016  . Decreased cardiac ejection fraction 10/01/2016  . Severe obesity ([place]) 10/01/2016  . Shingles 08/03/2016  . Chronic pain of right knee 05/24/2016  . Unilateral primary osteoarthritis, right knee 05/24/2016  . Posterior tibial tendinitis, right leg 01/30/2016  . Lichen sclerosus 11/15/4959    Daleen Bo PT, DPT 07/12/20 3:35 PM   [facility] [address] [place], [place], [address] Phone: [phone]   Fax:  [phone]  Name: Savannah [name] MRN: [id number] Date of Birth: Dec 12, 1960

## 2020-07-13 ENCOUNTER — Other Ambulatory Visit: Payer: Self-pay

## 2020-07-13 ENCOUNTER — Ambulatory Visit (INDEPENDENT_AMBULATORY_CARE_PROVIDER_SITE_OTHER): Payer: BC Managed Care – PPO | Admitting: Obstetrics & Gynecology

## 2020-07-13 ENCOUNTER — Encounter (HOSPITAL_BASED_OUTPATIENT_CLINIC_OR_DEPARTMENT_OTHER): Payer: Self-pay | Admitting: Obstetrics & Gynecology

## 2020-07-13 VITALS — BP 105/77 | HR 90 | Ht 65.0 in | Wt 217.0 lb

## 2020-07-13 DIAGNOSIS — L900 Lichen sclerosus et atrophicus: Secondary | ICD-10-CM

## 2020-07-13 NOTE — Progress Notes (Signed)
GYNECOLOGY  VISIT  CC:   Recheck lichen sclerosus  HPI: 60 y.o. G0P0000 Married White or Caucasian female here for recheck of vulvar lichen sclerosus.  Reports she has not been as faithful with using steroid ointment as she should be and still having a little itching but it is improved.  Denies bleeding, vaginal or at skin.  Down now about 50 pounds since surgery, gastric sleeve.  Feeling well.  GYNECOLOGIC HISTORY: Patient's last menstrual period was 10/14/2010.  Patient Active Problem List   Diagnosis Date Noted  . Status post total left knee replacement 05/23/2020  . Primary osteoarthritis of left knee 05/05/2020  . 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 Fatty liver disease, nonalcoholic 70/26/3785  . 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  . Trigger finger, right middle finger 10/10/2018  . Other insomnia 06/23/2018  . Vitamin D deficiency 03/26/2018  . Prediabetes 03/11/2018  . Obesity 02/26/2018  . Migraines 10/01/2016  . Decreased cardiac ejection fraction 10/01/2016  . Severe obesity ([place]) 10/01/2016  . Shingles 08/03/2016  . Chronic pain of right knee 05/24/2016  . Unilateral primary osteoarthritis, right knee 05/24/2016  . Posterior tibial tendinitis, right leg 01/30/2016  . Lichen sclerosus 88/50/2774    Past Medical History:  Diagnosis Date  . Arthritis    bilateral knees, recent LCL sprain-on left knee  . CHF (congestive heart failure) ([place])   . Dysrhythmia    trigem  . Environmental allergies   . Family history of adverse reaction to anesthesia    slow to wake up  . Fatty liver   . Headache(784.0)    migraines-on meds for control, relpax, ketoprophen, vicoden, muscle relaxer  . Hearing loss 2016   High frequency hearing - Bilateral   . History of hiatal hernia   . Hot flashes   . Joint pain   . Lichen sclerosus   . Lipoma    Right Knee  . Menorrhagia   . Migraines   . Necrobiosis lipoidica   . Osteoarthritis   . Overweight    . Pelvic pain in female   . PONV (postoperative nausea and vomiting)    ponv, slow to awake  . Seasonal allergies   . Sinus problem   . Sprain 10/25/2010   left knee  . Trigeminal pulse   . Trigger finger, right    Middle finger and right wrist   . Vitamin D deficiency     Past Surgical History:  Procedure Laterality Date  . APPENDECTOMY    . CHOLECYSTECTOMY    . DIAGNOSTIC LAPAROSCOPY  08/2006  . DILATION AND CURETTAGE OF UTERUS  2011   attempted ablation  . FUNCTIONAL ENDOSCOPIC SINUS SURGERY    . ganglion cyst removal    . HYSTEROSCOPY     failed  . KNEE ARTHROPLASTY Right 2018   meniscus  . LAPAROSCOPIC GASTRIC SLEEVE RESECTION  01/2020  . LASIK    . LIPOMA EXCISION Right 07/22/2014   Procedure: EXCISION RIGHT THIGH LIPOMA;  Surgeon: Erroll Luna, MD;  Location: [facility];  Service: General;  Laterality: Right;  . ROBOTIC ASSISTED TOTAL HYSTERECTOMY  11/06/10   TLH/RSO  . SALPINGOOPHORECTOMY  11/06/2010   Procedure: SALPINGO OOPHERECTOMY;  Surgeon: Felipa Emory;  Location: [facility];  Service: Gynecology;  Laterality: Right;  . TOTAL KNEE ARTHROPLASTY Left 05/23/2020   Procedure: LEFT TOTAL KNEE ARTHROPLASTY;  Surgeon: Leandrew Koyanagi, MD;  Location: [facility];  Service: Orthopedics;  Laterality: Left;  . TRIGGER FINGER RELEASE    .  UPPER GI ENDOSCOPY N/A 02/02/2020   Procedure: UPPER GI ENDOSCOPY;  Surgeon: [name], MD;  Location: [facility];  Service: General;  Laterality: N/A;    MEDS:   Current Outpatient Medications on File Prior to Visit  Medication Sig Dispense Refill  . Ca Phosphate-Cholecalciferol (CALCIUM 500 + D3) 250-500 MG-UNIT CHEW Chew 1 tablet by mouth in the morning, at noon, and at bedtime. Bariatric Calcium Citrate chewy    . carvedilol (COREG) 6.25 MG tablet Take 1 tablet (6.25 mg total) by mouth 2 (two) times daily with a meal. 180 tablet 3  . clobetasol cream (TEMOVATE) 9.62 % Apply 1 application topically as needed (lichen sclerosus).  (Patient taking differently: Apply 1 application topically in the morning and at bedtime.) 60 g 1  . eletriptan (RELPAX) 40 MG tablet TAKE 1 TABLET TWICE A DAY  AS NEEDED FOR MIGRAINE OR  HEADACHE 6 tablet 9  . furosemide (LASIX) 20 MG tablet Take 0.5 tablets (10 mg total) by mouth daily as needed for edema. 45 tablet 3  . hyoscyamine (LEVSIN) 0.125 MG tablet Take 0.125 mg by mouth every 4 (four) hours as needed (abdominal cramps/spasms).    [name] lisinopril (ZESTRIL) 10 MG tablet TAKE 1 TABLET DAILY (Patient taking differently: Take 10 mg by mouth in the morning.) 90 tablet 0  . meclizine (ANTIVERT) 25 MG tablet Take 25 mg by mouth 4 (four) times daily as needed for dizziness.    . methocarbamol (ROBAXIN) 500 MG tablet Take 1 tablet (500 mg total) by mouth 2 (two) times daily as needed. 20 tablet 0  . methocarbamol (ROBAXIN) 500 MG tablet Take 1 tablet (500 mg total) by mouth 2 (two) times daily as needed. To be taken post-op 30 tablet 6  . mometasone (ELOCON) 0.1 % ointment Use small topically twice weekly for maintenance. (Patient taking differently: Apply 1 application topically See admin instructions. Use small topically twice weekly as needed for irritation) 45 g 3  . Multiple Vitamins-Minerals (BARIATRIC MULTIVITAMINS/IRON PO) Take 1 tablet by mouth in the morning. BariMelts Multivitamin W/Iron (chewable)    . multivitamin-lutein (OCUVITE-LUTEIN) CAPS capsule Take 1 capsule by mouth in the morning.    . ondansetron (ZOFRAN) 4 MG tablet Take 1 tablet (4 mg total) by mouth every 8 (eight) hours as needed for nausea or vomiting. 40 tablet 0  . oxyCODONE-acetaminophen (PERCOCET) 5-325 MG tablet Take 1-2 tablets by mouth every 8 (eight) hours as needed. 40 tablet 0  . promethazine (PHENERGAN) 25 MG tablet Take 25 mg by mouth every 6 (six) hours as needed for nausea or vomiting.    . docusate sodium (COLACE) 100 MG capsule Take 1 capsule (100 mg total) by mouth daily as needed. (Patient not taking:  Reported on 07/13/2020) 30 capsule 2  . rivaroxaban (XARELTO) 10 MG TABS tablet Take 1 tablet (10 mg total) by mouth daily. To be taken post-op for dvt prophylaxis (Patient not taking: Reported on 07/13/2020) 30 tablet 0  . [DISCONTINUED] enoxaparin (LOVENOX) 40 MG/0.4ML injection Inject 0.4 mLs (40 mg total) into the skin every 12 (twelve) hours for 28 days. 22.4 mL 0  . [DISCONTINUED] gabapentin (NEURONTIN) 100 MG capsule Take 2 capsules (200 mg total) by mouth every 12 (twelve) hours. 20 capsule 0  . [DISCONTINUED] pantoprazole (PROTONIX) 40 MG tablet Take 1 tablet (40 mg total) by mouth daily. 90 tablet 0   No current facility-administered medications on file prior to visit.    ALLERGIES: Dust mite extract and Nsaids  Family  History  Problem Relation Age of Onset  . Skin cancer Mother   . Migraines Mother   . High blood pressure Mother   . Heart disease Father   . High blood pressure Father   . High Cholesterol Father   . Breast cancer Maternal Aunt 70  . Breast cancer Paternal Aunt 21  . Migraines Brother     SH:  Married, non smoker  Review of Systems  Constitutional: Negative.     PHYSICAL EXAMINATION:    BP 105/77 (BP Location: Left Arm, Patient Position: Sitting, Cuff Size: Large)   Pulse 90   Ht 5\' 5"  (1.651 m)   Wt 217 lb (98.4 kg)   LMP 10/14/2010   BMI 36.11 kg/m     Physical Exam Vitals reviewed.  Constitutional:      Appearance: Normal appearance.  Genitourinary:    Labia:        Right: Lesion present.        Left: Lesion present.     Lymphadenopathy:     Lower Body: No right inguinal adenopathy. No left inguinal adenopathy.  Neurological:     General: No focal deficit present.     Mental Status: She is alert and oriented to person, place, and time.  Psychiatric:        Mood and Affect: Mood normal.    Chaperone, Octaviano Batty, CMA, was present for exam.  Assessment/Plan: 1. Lichen sclerosus - pt will continue clobetasol 0.05% ointment but  decrease to nightly now.  Continue for another month and then switch to mometasone, twice weekly.  Has refills and does not need additional at this time.  If itching does not fully resolve, advised to return for biopsy.

## 2020-07-14 ENCOUNTER — Ambulatory Visit (INDEPENDENT_AMBULATORY_CARE_PROVIDER_SITE_OTHER): Payer: BC Managed Care – PPO | Admitting: Rehabilitative and Restorative Service Providers"

## 2020-07-14 ENCOUNTER — Encounter: Payer: Self-pay | Admitting: Rehabilitative and Restorative Service Providers"

## 2020-07-14 DIAGNOSIS — M25562 Pain in left knee: Secondary | ICD-10-CM

## 2020-07-14 DIAGNOSIS — M25662 Stiffness of left knee, not elsewhere classified: Secondary | ICD-10-CM | POA: Diagnosis not present

## 2020-07-14 DIAGNOSIS — R262 Difficulty in walking, not elsewhere classified: Secondary | ICD-10-CM | POA: Diagnosis not present

## 2020-07-14 DIAGNOSIS — M6281 Muscle weakness (generalized): Secondary | ICD-10-CM | POA: Diagnosis not present

## 2020-07-14 DIAGNOSIS — R600 Localized edema: Secondary | ICD-10-CM

## 2020-07-14 DIAGNOSIS — G8929 Other chronic pain: Secondary | ICD-10-CM

## 2020-07-14 NOTE — Patient Instructions (Signed)
Emphasis on 100 quad sets/day for edema, knee extension AROM and strength

## 2020-07-14 NOTE — Therapy (Signed)
[facility] [address] [place], [place], [address] Phone: [phone]   Fax:  [phone]  Physical Therapy Treatment  Patient Details  Name: Savannah [name] MRN: [id number] Date of Birth: 06/15/1960 Referring Provider (PT): Frankey Shown MD   Encounter Date: 07/14/2020   PT End of Session - 07/14/20 1511    Visit Number 12    Number of Visits 24    PT Start Time 1430    PT Stop Time 1525    PT Time Calculation (min) 55 min    Activity Tolerance Patient tolerated treatment well    Behavior During Therapy [facility] for tasks assessed/performed           Past Medical History:  Diagnosis Date  . Arthritis    bilateral knees, recent LCL sprain-on left knee  . CHF (congestive heart failure) ([place])   . Dysrhythmia    trigem  . Environmental allergies   . Family history of adverse reaction to anesthesia    slow to wake up  . Fatty liver   . Headache(784.0)    migraines-on meds for control, relpax, ketoprophen, vicoden, muscle relaxer  . Hearing loss 2016   High frequency hearing - Bilateral   . History of hiatal hernia   . Hot flashes   . Joint pain   . Lichen sclerosus   . Lipoma    Right Knee  . Menorrhagia   . Migraines   . Necrobiosis lipoidica   . Osteoarthritis   . Overweight   . Pelvic pain in female   . PONV (postoperative nausea and vomiting)    ponv, slow to awake  . Seasonal allergies   . Sinus problem   . Sprain 10/25/2010   left knee  . Trigeminal pulse   . Trigger finger, right    Middle finger and right wrist   . Vitamin D deficiency     Past Surgical History:  Procedure Laterality Date  . APPENDECTOMY    . CHOLECYSTECTOMY    . DIAGNOSTIC LAPAROSCOPY  08/2006  . DILATION AND CURETTAGE OF UTERUS  2011   attempted ablation  . FUNCTIONAL ENDOSCOPIC SINUS SURGERY    . ganglion cyst removal    . HYSTEROSCOPY     failed  . KNEE ARTHROPLASTY Right 2018   meniscus  . LAPAROSCOPIC GASTRIC SLEEVE RESECTION  01/2020   . LASIK    . LIPOMA EXCISION Right 07/22/2014   Procedure: EXCISION RIGHT THIGH LIPOMA;  Surgeon: Erroll Luna, MD;  Location: [place];  Service: General;  Laterality: Right;  . ROBOTIC ASSISTED TOTAL HYSTERECTOMY  11/06/10   TLH/RSO  . SALPINGOOPHORECTOMY  11/06/2010   Procedure: SALPINGO OOPHERECTOMY;  Surgeon: Felipa Emory;  Location: [facility];  Service: Gynecology;  Laterality: Right;  . TOTAL KNEE ARTHROPLASTY Left 05/23/2020   Procedure: LEFT TOTAL KNEE ARTHROPLASTY;  Surgeon: Leandrew Koyanagi, MD;  Location: [place];  Service: Orthopedics;  Laterality: Left;  . TRIGGER FINGER RELEASE    . UPPER GI ENDOSCOPY N/A 02/02/2020   Procedure: UPPER GI ENDOSCOPY;  Surgeon: [name], MD;  Location: [facility];  Service: General;  Laterality: N/A;    There were no vitals filed for this visit.   Subjective Assessment - 07/14/20 1434    Subjective Savannah [name] still has some sleep interruptions due to her knee.  She has returned to work and is "tired" not necessarily hurting.    Patient is accompained by: Family member    Pertinent History R knee  scope 2018, Bariatric surgery, CHF    Limitations Sitting;House hold activities;Standing;Walking    How long can you sit comfortably? Stiffness with standing after prolonged sitting    How long can you stand comfortably? 10-15 minutes    How long can you walk comfortably? 20-25 minutes    Patient Stated Goals Walk better without the cane for yard work and return to work    Currently in Pain? Yes    Pain Score 3     Pain Location Knee    Pain Orientation Left    Pain Descriptors / Indicators Aching;Tightness;Sore    Pain Type Chronic pain;Surgical pain    Pain Radiating Towards NA    Pain Onset More than a month ago    Pain Frequency Constant    Aggravating Factors  Prolonged activities (sit, stand, walk)    Pain Relieving Factors Ice, exercises    Effect of Pain on Daily Activities Working part-time from home    Multiple Pain Sites No               [facility] PT Assessment - 07/14/20 0001      AROM   Left Knee Extension -7    Left Knee Flexion 100                         [facility] Adult PT Treatment/Exercise - 07/14/20 0001      Therapeutic Activites    Therapeutic Activities ADL's    ADL's Step up and over 6 and 8 inch steps slow eccentrics      Neuro Re-ed    Neuro Re-ed Details  Tandem balance eyes open/closed/open with head turning 3X 20 seconds and dynamic heel to toe balance      Exercises   Exercises Knee/Hip      Knee/Hip Exercises: Aerobic   Recumbent Bike Seat 6 for 8 minutes      Knee/Hip Exercises: Machines for Strengthening   Total Gym Leg Press 112# 2 sets of 15, 56# L LE only 2 sets of 15      Knee/Hip Exercises: Supine   Quad Sets Strengthening;Both;2 sets;10 reps;Limitations    Quad Sets Limitations 5 seconds    Knee Flexion AAROM;Left;1 set;10 reps;Limitations    Knee Flexion Limitations 10 seconds with belt      Modalities   Modalities Vasopneumatic      Vasopneumatic   Number Minutes Vasopneumatic  10 minutes    Vasopnuematic Location  Knee    Vasopneumatic Pressure Medium    Vasopneumatic Temperature  34                  PT Education - 07/14/20 1508    Education Details Reviewed HEP, progressed step height and empasis on edema control, quadriceps strength and knee extension AROM    Person(s) Educated Patient    Methods Explanation;Demonstration;Tactile cues;Verbal cues    Comprehension Need further instruction;Returned demonstration;Verbal cues required;Verbalized understanding            PT Short Term Goals - 07/14/20 1509      PT SHORT TERM GOAL #1   Title Savannah [name] will report independence and compliance with her starter HEP addressing AROM and quadriceps strength impairments.    Status Achieved             PT Long Term Goals - 07/14/20 1510      PT LONG TERM GOAL #1   Title Improve FOTO score to 59.    Baseline  40 at 06/30/20 RA, 35 at  evaluation    Time 8    Period Weeks    Status On-going      PT LONG TERM GOAL #2   Title Improve L knee pain to consistently 0-3/10 on the Numeric Pain Rating Scale.    Baseline Can be 5+/10    Time 8    Status On-going      PT LONG TERM GOAL #3   Title Savannah [name] will be able to walk 1/4 mile or more from her car to her office without an assistive device or complaints of functional difficulty.    Baseline Can walk but 1/4 mile without an AD    Time 8    Period Weeks    Status Achieved      PT LONG TERM GOAL #4   Title Improve L knee AROM for extension to -3 degrees or better and flexion to 110 degrees or better by DC.    Baseline -7 to 100 degrees    Time 8    Period Weeks    Status On-going      PT LONG TERM GOAL #5   Title Savannah [name] will be independent and compliant with her long-term HEP at DC.    Status On-going                 Plan - 07/14/20 1512    Clinical Impression Statement Savannah [name] continues to be consistent with her HEP.  Edema is limiting AROM (-7 to 100 today) and Savannah [name]'s HEP is focused on returning full AROM (particularly extension), reducing edema and improving quadriceps strength.  Continue current emphasis to meet all LTGs.    Personal Factors and Comorbidities Comorbidity 3+    Comorbidities Previous R knee scope in 2018, bariatric surgery, CHF    Examination-Activity Limitations Bathing;Dressing;Sit;Transfers;Bed Mobility;Sleep;Bend;Lift;Squat;Locomotion Level;Stairs;Carry;Stand    Examination-Participation Restrictions Interpersonal Relationship;Occupation;Yard Work;Community Activity    Stability/Clinical Decision Making Stable/Uncomplicated    Rehab Potential Good    PT Frequency 3x / week    PT Duration 8 weeks    PT Treatment/Interventions ADLs/Self Care Home Management;Electrical Stimulation;Cryotherapy;Therapeutic activities;Stair training;Gait training;Therapeutic exercise;Balance training;Neuromuscular re-education;Patient/family education;Manual  techniques;Vasopneumatic Device    PT Next Visit Plan Knee ROM, Progress quadriceps strength, leg press, knee extension machine, lateral step up, STS to lower height    PT Home Exercise Plan Access Code: [email or web address]    Consulted and Agree with Plan of Care Patient;Family member/caregiver           Patient will benefit from skilled therapeutic intervention in order to improve the following deficits and impairments:  Abnormal gait,Decreased activity tolerance,Decreased balance,Decreased endurance,Decreased range of motion,Decreased strength,Difficulty walking,Increased edema,Impaired flexibility,Pain,Obesity  Visit Diagnosis: Difficulty walking  Muscle weakness (generalized)  Stiffness of left knee, not elsewhere classified  Chronic pain of left knee  Localized edema     Problem List Patient Active Problem List   Diagnosis Date Noted  . Status post total left knee replacement 05/23/2020  . Primary osteoarthritis of left knee 05/05/2020  . 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 Fatty liver disease, nonalcoholic 09/25/4816  . 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  . Trigger finger, right middle finger 10/10/2018  . Other insomnia 06/23/2018  . Vitamin D deficiency 03/26/2018  . Prediabetes 03/11/2018  . Obesity 02/26/2018  . Migraines 10/01/2016  . Decreased cardiac ejection fraction 10/01/2016  . Severe obesity ([place]) 10/01/2016  . Shingles 08/03/2016  . Chronic pain of right knee 05/24/2016  . Unilateral primary osteoarthritis, right  knee 05/24/2016  . Posterior tibial tendinitis, right leg 01/30/2016  . Lichen sclerosus 34/96/1164    Farley Ly  PT, MPT 07/14/2020, 4:45 PM  [facility] [address] [place], [place], [address] Phone: [phone]   Fax:  [phone]  Name: Savannah [name] MRN: [id number] Date of Birth: 1960-11-07

## 2020-07-15 ENCOUNTER — Other Ambulatory Visit: Payer: Self-pay | Admitting: Obstetrics & Gynecology

## 2020-07-15 ENCOUNTER — Ambulatory Visit (INDEPENDENT_AMBULATORY_CARE_PROVIDER_SITE_OTHER): Payer: BC Managed Care – PPO | Admitting: Rehabilitative and Restorative Service Providers"

## 2020-07-15 ENCOUNTER — Encounter: Payer: Self-pay | Admitting: Rehabilitative and Restorative Service Providers"

## 2020-07-15 ENCOUNTER — Other Ambulatory Visit: Payer: Self-pay

## 2020-07-15 DIAGNOSIS — M25662 Stiffness of left knee, not elsewhere classified: Secondary | ICD-10-CM | POA: Diagnosis not present

## 2020-07-15 DIAGNOSIS — R600 Localized edema: Secondary | ICD-10-CM | POA: Diagnosis not present

## 2020-07-15 DIAGNOSIS — G8929 Other chronic pain: Secondary | ICD-10-CM

## 2020-07-15 DIAGNOSIS — M25562 Pain in left knee: Secondary | ICD-10-CM

## 2020-07-15 DIAGNOSIS — R262 Difficulty in walking, not elsewhere classified: Secondary | ICD-10-CM

## 2020-07-15 DIAGNOSIS — M6281 Muscle weakness (generalized): Secondary | ICD-10-CM

## 2020-07-15 DIAGNOSIS — Z1231 Encounter for screening mammogram for malignant neoplasm of breast: Secondary | ICD-10-CM

## 2020-07-15 NOTE — Therapy (Signed)
[facility] [place], [place], [address] Phone: [phone]   Fax:  [phone]  Physical Therapy Treatment  Patient Details  Name: Savannah [name] MRN: [id number] Date of Birth: 1960/03/29 Referring Provider (PT): Frankey Shown MD   Encounter Date: 07/15/2020   PT End of Session - 07/15/20 1124    Visit Number 13    Number of Visits 24    PT Start Time 1055    PT Stop Time 1155    PT Time Calculation (min) 60 min    Activity Tolerance Patient tolerated treatment well;Patient limited by fatigue    Behavior During Therapy [facility] for tasks assessed/performed           Past Medical History:  Diagnosis Date  . Arthritis    bilateral knees, recent LCL sprain-on left knee  . CHF (congestive heart failure) ([place])   . Dysrhythmia    trigem  . Environmental allergies   . Family history of adverse reaction to anesthesia    slow to wake up  . Fatty liver   . Headache(784.0)    migraines-on meds for control, relpax, ketoprophen, vicoden, muscle relaxer  . Hearing loss 2016   High frequency hearing - Bilateral   . History of hiatal hernia   . Hot flashes   . Joint pain   . Lichen sclerosus   . Lipoma    Right Knee  . Menorrhagia   . Migraines   . Necrobiosis lipoidica   . Osteoarthritis   . Overweight   . Pelvic pain in female   . PONV (postoperative nausea and vomiting)    ponv, slow to awake  . Seasonal allergies   . Sinus problem   . Sprain 10/25/2010   left knee  . Trigeminal pulse   . Trigger finger, right    Middle finger and right wrist   . Vitamin D deficiency     Past Surgical History:  Procedure Laterality Date  . APPENDECTOMY    . CHOLECYSTECTOMY    . DIAGNOSTIC LAPAROSCOPY  08/2006  . DILATION AND CURETTAGE OF UTERUS  2011   attempted ablation  . FUNCTIONAL ENDOSCOPIC SINUS SURGERY    . ganglion cyst removal    . HYSTEROSCOPY     failed  . KNEE ARTHROPLASTY Right 2018   meniscus  . LAPAROSCOPIC GASTRIC  SLEEVE RESECTION  01/2020  . LASIK    . LIPOMA EXCISION Right 07/22/2014   Procedure: EXCISION RIGHT THIGH LIPOMA;  Surgeon: Erroll Luna, MD;  Location: [place];  Service: General;  Laterality: Right;  . ROBOTIC ASSISTED TOTAL HYSTERECTOMY  11/06/10   TLH/RSO  . SALPINGOOPHORECTOMY  11/06/2010   Procedure: SALPINGO OOPHERECTOMY;  Surgeon: Felipa Emory;  Location: [facility];  Service: Gynecology;  Laterality: Right;  . TOTAL KNEE ARTHROPLASTY Left 05/23/2020   Procedure: LEFT TOTAL KNEE ARTHROPLASTY;  Surgeon: Leandrew Koyanagi, MD;  Location: [place];  Service: Orthopedics;  Laterality: Left;  . TRIGGER FINGER RELEASE    . UPPER GI ENDOSCOPY N/A 02/02/2020   Procedure: UPPER GI ENDOSCOPY;  Surgeon: [name], MD;  Location: [facility];  Service: General;  Laterality: N/A;    There were no vitals filed for this visit.   Subjective Assessment - 07/15/20 1100    Subjective Her knee did not keep her up last night.  She will be returning to work after PT today.    Patient is accompained by: Family member    Pertinent History R knee  scope 2018, Bariatric surgery, CHF    Limitations Sitting;House hold activities;Standing;Walking    How long can you sit comfortably? Stiffness with standing after prolonged sitting    How long can you stand comfortably? 10-15 minutes    How long can you walk comfortably? 20-25 minutes    Patient Stated Goals Walk better without the cane for yard work and return to work    Currently in Pain? Yes    Pain Score 3     Pain Location Knee    Pain Orientation Left    Pain Descriptors / Indicators Aching;Tightness;Sore    Pain Type Chronic pain;Surgical pain    Pain Radiating Towards NA    Pain Onset More than a month ago    Pain Frequency Constant    Aggravating Factors  Prolonged sitting, standing and walking    Pain Relieving Factors Ice and exercises    Effect of Pain on Daily Activities Working part-time from home    Multiple Pain Sites No                              [facility] Adult PT Treatment/Exercise - 07/15/20 0001      Therapeutic Activites    Therapeutic Activities ADL's    ADL's Step up and over 6 and 8 inch steps slow eccentrics      Neuro Re-ed    Neuro Re-ed Details  Tandem balance eyes closed/open with head turning 3X 20 seconds and single leg balance 3X 20 seconds      Exercises   Exercises Knee/Hip      Knee/Hip Exercises: Aerobic   Recumbent Bike Seat 6 for 8 minutes      Knee/Hip Exercises: Machines for Strengthening   Cybex Knee Extension 10# 2 sets of 15 up with both, down with L only slow eccentrics    Total Gym Leg Press 112# 2 sets of 15, 56# L LE only 2 sets of 15      Knee/Hip Exercises: Supine   Quad Sets --    Quad Sets Limitations --    Knee Flexion --    Knee Flexion Limitations --      Modalities   Modalities Vasopneumatic      Vasopneumatic   Number Minutes Vasopneumatic  10 minutes    Vasopnuematic Location  Knee    Vasopneumatic Pressure Medium    Vasopneumatic Temperature  34                  PT Education - 07/15/20 1122    Education Details Reviewed importance of edema control, quadriceps sets and knee flexion AROM with HEP.    Person(s) Educated Patient    Methods Explanation;Demonstration;Verbal cues    Comprehension Verbalized understanding;Returned demonstration;Need further instruction;Verbal cues required            PT Short Term Goals - 07/15/20 1123      PT SHORT TERM GOAL #1   Title Savannah [name] will report independence and compliance with her starter HEP addressing AROM and quadriceps strength impairments.    Status Achieved             PT Long Term Goals - 07/15/20 1123      PT LONG TERM GOAL #1   Title Improve FOTO score to 59.    Baseline 40 at 06/30/20 RA, 35 at evaluation    Time 8    Period Weeks    Status On-going  PT LONG TERM GOAL #2   Title Improve L knee pain to consistently 0-3/10 on the Numeric Pain Rating Scale.     Baseline Can be 5+/10    Time 8    Status On-going      PT LONG TERM GOAL #3   Title Savannah [name] will be able to walk 1/4 mile or more from her car to her office without an assistive device or complaints of functional difficulty.    Baseline Can walk but 1/4 mile without an AD    Time 8    Period Weeks    Status Achieved      PT LONG TERM GOAL #4   Title Improve L knee AROM for extension to -3 degrees or better and flexion to 110 degrees or better by DC.    Baseline -7 to 100 degrees    Time 8    Period Weeks    Status On-going      PT LONG TERM GOAL #5   Title Savannah [name] will be independent and compliant with her long-term HEP at DC.    Status On-going                 Plan - 07/15/20 1145    Clinical Impression Statement Savannah [name] continues her great effort at home and in the office with her physical therapy.  Edema is the primary concern and is being addressed by appropriate quadriceps strength work to decrease strain on the knee joint.  Continue AROM, strength, balance and functional work to meet LTGs.    Personal Factors and Comorbidities Comorbidity 3+    Comorbidities Previous R knee scope in 2018, bariatric surgery, CHF    Examination-Activity Limitations Bathing;Dressing;Sit;Transfers;Bed Mobility;Sleep;Bend;Lift;Squat;Locomotion Level;Stairs;Carry;Stand    Examination-Participation Restrictions Interpersonal Relationship;Occupation;Yard Work;Community Activity    Stability/Clinical Decision Making Stable/Uncomplicated    Rehab Potential Good    PT Frequency 3x / week    PT Duration 8 weeks    PT Treatment/Interventions ADLs/Self Care Home Management;Electrical Stimulation;Cryotherapy;Therapeutic activities;Stair training;Gait training;Therapeutic exercise;Balance training;Neuromuscular re-education;Patient/family education;Manual techniques;Vasopneumatic Device    PT Next Visit Plan Knee ROM, Progress quadriceps strength, leg press, knee extension machine, lateral step up, STS  to lower height    PT Home Exercise Plan Access Code: [email or web address]    Consulted and Agree with Plan of Care Patient;Family member/caregiver           Patient will benefit from skilled therapeutic intervention in order to improve the following deficits and impairments:  Abnormal gait,Decreased activity tolerance,Decreased balance,Decreased endurance,Decreased range of motion,Decreased strength,Difficulty walking,Increased edema,Impaired flexibility,Pain,Obesity  Visit Diagnosis: Difficulty walking  Muscle weakness (generalized)  Localized edema  Stiffness of left knee, not elsewhere classified  Chronic pain of left knee     Problem List Patient Active Problem List   Diagnosis Date Noted  . Status post total left knee replacement 05/23/2020  . Primary osteoarthritis of left knee 05/05/2020  . 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 Fatty liver disease, nonalcoholic 62/69/4854  . 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  . Trigger finger, right middle finger 10/10/2018  . Other insomnia 06/23/2018  . Vitamin D deficiency 03/26/2018  . Prediabetes 03/11/2018  . Obesity 02/26/2018  . Migraines 10/01/2016  . Decreased cardiac ejection fraction 10/01/2016  . Severe obesity ([place]) 10/01/2016  . Shingles 08/03/2016  . Chronic pain of right knee 05/24/2016  . Unilateral primary osteoarthritis, right knee 05/24/2016  . Posterior tibial tendinitis, right leg 01/30/2016  . Lichen sclerosus 62/70/3500    Farley Ly  PT, MPT 07/15/2020, 12:45 PM  [facility] Physical Therapy [address] [place], [place], [address] Phone: [phone]   Fax:  [phone]  Name: Savannah [name] MRN: [id number] Date of Birth: 08-14-1960

## 2020-07-16 ENCOUNTER — Encounter: Payer: Self-pay | Admitting: Orthopaedic Surgery

## 2020-07-18 ENCOUNTER — Encounter: Payer: BC Managed Care – PPO | Admitting: Physical Therapy

## 2020-07-18 ENCOUNTER — Other Ambulatory Visit: Payer: Self-pay | Admitting: Physician Assistant

## 2020-07-18 ENCOUNTER — Encounter: Payer: Self-pay | Admitting: Physical Therapy

## 2020-07-18 MED ORDER — AMOXICILLIN 500 MG PO TABS: ORAL_TABLET | ORAL | 2 refills | Status: DC

## 2020-07-18 NOTE — Telephone Encounter (Signed)
I pended order.  Can you send to correct pharmacy?

## 2020-07-18 NOTE — Telephone Encounter (Signed)
Ok, sent in 

## 2020-07-20 ENCOUNTER — Ambulatory Visit (INDEPENDENT_AMBULATORY_CARE_PROVIDER_SITE_OTHER): Payer: BC Managed Care – PPO | Admitting: Physical Therapy

## 2020-07-20 ENCOUNTER — Other Ambulatory Visit: Payer: Self-pay

## 2020-07-20 DIAGNOSIS — R600 Localized edema: Secondary | ICD-10-CM

## 2020-07-20 DIAGNOSIS — M6281 Muscle weakness (generalized): Secondary | ICD-10-CM

## 2020-07-20 DIAGNOSIS — M25662 Stiffness of left knee, not elsewhere classified: Secondary | ICD-10-CM | POA: Diagnosis not present

## 2020-07-20 DIAGNOSIS — G8929 Other chronic pain: Secondary | ICD-10-CM

## 2020-07-20 DIAGNOSIS — M25562 Pain in left knee: Secondary | ICD-10-CM

## 2020-07-20 DIAGNOSIS — R262 Difficulty in walking, not elsewhere classified: Secondary | ICD-10-CM

## 2020-07-20 NOTE — Therapy (Signed)
[place], [place], [address] Phone: [phone]   Fax:  [phone]  Physical Therapy Treatment  Patient Details  Name: Savannah [name] MRN: [id number] Date of Birth: 05-27-1960 Referring Provider (PT): Frankey Shown MD   Encounter Date: 07/20/2020   PT End of Session - 07/20/20 1510    Visit Number 14    Number of Visits 24    PT Start Time 0230    PT Stop Time 0318    PT Time Calculation (min) 48 min    Activity Tolerance Patient tolerated treatment well;Patient limited by fatigue    Behavior During Therapy [facility] for tasks assessed/performed           Past Medical History:  Diagnosis Date  . Arthritis    bilateral knees, recent LCL sprain-on left knee  . CHF (congestive heart failure) ([place])   . Dysrhythmia    trigem  . Environmental allergies   . Family history of adverse reaction to anesthesia    slow to wake up  . Fatty liver   . Headache(784.0)    migraines-on meds for control, relpax, ketoprophen, vicoden, muscle relaxer  . Hearing loss 2016   High frequency hearing - Bilateral   . History of hiatal hernia   . Hot flashes   . Joint pain   . Lichen sclerosus   . Lipoma    Right Knee  . Menorrhagia   . Migraines   . Necrobiosis lipoidica   . Osteoarthritis   . Overweight   . Pelvic pain in female   . PONV (postoperative nausea and vomiting)    ponv, slow to awake  . Seasonal allergies   . Sinus problem   . Sprain 10/25/2010   left knee  . Trigeminal pulse   . Trigger finger, right    Middle finger and right wrist   . Vitamin D deficiency     Past Surgical History:  Procedure Laterality Date  . APPENDECTOMY    . CHOLECYSTECTOMY    . DIAGNOSTIC LAPAROSCOPY  08/2006  . DILATION AND CURETTAGE OF UTERUS  2011   attempted ablation  . FUNCTIONAL ENDOSCOPIC SINUS SURGERY    . ganglion cyst removal    . HYSTEROSCOPY     failed  . KNEE ARTHROPLASTY Right 2018   meniscus  . LAPAROSCOPIC GASTRIC  SLEEVE RESECTION  01/2020  . LASIK    . LIPOMA EXCISION Right 07/22/2014   Procedure: EXCISION RIGHT THIGH LIPOMA;  Surgeon: Erroll Luna, MD;  Location: [place];  Service: General;  Laterality: Right;  . ROBOTIC ASSISTED TOTAL HYSTERECTOMY  11/06/10   TLH/RSO  . SALPINGOOPHORECTOMY  11/06/2010   Procedure: SALPINGO OOPHERECTOMY;  Surgeon: Felipa Emory;  Location: [facility];  Service: Gynecology;  Laterality: Right;  . TOTAL KNEE ARTHROPLASTY Left 05/23/2020   Procedure: LEFT TOTAL KNEE ARTHROPLASTY;  Surgeon: Leandrew Koyanagi, MD;  Location: [place];  Service: Orthopedics;  Laterality: Left;  . TRIGGER FINGER RELEASE    . UPPER GI ENDOSCOPY N/A 02/02/2020   Procedure: UPPER GI ENDOSCOPY;  Surgeon: [name], MD;  Location: [facility];  Service: General;  Laterality: N/A;    There were no vitals filed for this visit.   Subjective Assessment - 07/20/20 1449    Subjective Her Lt knee is doing good less than 3/10 pain. She tweaked her Rt knee however 2 days ago and had a lot of pain and swelling. She will see MD about this Friday.  Patient is accompained by: Family member    Pertinent History R knee scope 2018, Bariatric surgery, CHF    Limitations Sitting;House hold activities;Standing;Walking    How long can you sit comfortably? Stiffness with standing after prolonged sitting    How long can you stand comfortably? 10-15 minutes    How long can you walk comfortably? 20-25 minutes    Patient Stated Goals Walk better without the cane for yard work and return to work    Pain Onset More than a month ago              [facility] PT Assessment - 07/20/20 0001      Assessment   Medical Diagnosis s/p L TKA    Referring Provider (PT) Frankey Shown MD      AROM   Left Knee Extension -6    Left Knee Flexion 102      PROM   Left Knee Extension -4    Left Knee Flexion 102      Strength   Overall Strength Comments 4+ Lt knee strength overall             [facility] Adult PT  Treatment/Exercise - 07/20/20 0001      Knee/Hip Exercises: Stretches   Active Hamstring Stretch Left;3 reps;30 seconds    Active Hamstring Stretch Limitations standing with Lt foot DF on treadmill    Knee: Self-Stretch Limitations seated tailgate stretch 10 sec X 10    Gastroc Stretch Both;3 reps;30 seconds      Knee/Hip Exercises: Aerobic   Recumbent Bike 10 min      Knee/Hip Exercises: Machines for Strengthening   Total Gym Leg Press 100# 4X10 DL      Knee/Hip Exercises: Standing   Forward Step Up Left;Step Height: 6";15 reps;Hand Hold: 1      Vasopneumatic   Number Minutes Vasopneumatic  10 minutes    Vasopnuematic Location  Knee    Vasopneumatic Pressure Medium    Vasopneumatic Temperature  34      Manual Therapy   Manual therapy comments Lt knee PROM and mobs into extension, patella mobs                    PT Short Term Goals - 07/15/20 1123      PT SHORT TERM GOAL #1   Title [name] will report independence and compliance with her starter HEP addressing AROM and quadriceps strength impairments.    Status Achieved             PT Long Term Goals - 07/15/20 1123      PT LONG TERM GOAL #1   Title Improve FOTO score to 59.    Baseline 40 at 06/30/20 RA, 35 at evaluation    Time 8    Period Weeks    Status On-going      PT LONG TERM GOAL #2   Title Improve L knee pain to consistently 0-3/10 on the Numeric Pain Rating Scale.    Baseline Can be 5+/10    Time 8    Status On-going      PT LONG TERM GOAL #3   Title [name] will be able to walk 1/4 mile or more from her car to her office without an assistive device or complaints of functional difficulty.    Baseline Can walk but 1/4 mile without an AD    Time 8    Period Weeks    Status Achieved      PT  LONG TERM GOAL #4   Title Improve L knee AROM for extension to -3 degrees or better and flexion to 110 degrees or better by DC.    Baseline -7 to 100 degrees    Time 8    Period Weeks    Status  On-going      PT LONG TERM GOAL #5   Title [name] will be independent and compliant with her long-term HEP at DC.    Status On-going                 Plan - 07/20/20 1511    Clinical Impression Statement Updated measurements show some progress in ROM but still with limitations. Her strength is progressing well but was limited some by Rt knee pain that she will see MD about on Friday.    Personal Factors and Comorbidities Comorbidity 3+    Comorbidities Previous R knee scope in 2018, bariatric surgery, CHF    Examination-Activity Limitations Bathing;Dressing;Sit;Transfers;Bed Mobility;Sleep;Bend;Lift;Squat;Locomotion Level;Stairs;Carry;Stand    Examination-Participation Restrictions Interpersonal Relationship;Occupation;Yard Work;Community Activity    Stability/Clinical Decision Making Stable/Uncomplicated    Rehab Potential Good    PT Frequency 3x / week    PT Duration 8 weeks    PT Treatment/Interventions ADLs/Self Care Home Management;Electrical Stimulation;Cryotherapy;Therapeutic activities;Stair training;Gait training;Therapeutic exercise;Balance training;Neuromuscular re-education;Patient/family education;Manual techniques;Vasopneumatic Device    PT Next Visit Plan Knee ROM, Progress quadriceps strength, leg press, knee extension machine, lateral step up, STS to lower height    PT Home Exercise Plan Access Code: [email or web address]    Consulted and Agree with Plan of Care Patient;Family member/caregiver           Patient will benefit from skilled therapeutic intervention in order to improve the following deficits and impairments:  Abnormal gait,Decreased activity tolerance,Decreased balance,Decreased endurance,Decreased range of motion,Decreased strength,Difficulty walking,Increased edema,Impaired flexibility,Pain,Obesity  Visit Diagnosis: Difficulty walking  Muscle weakness (generalized)  Localized edema  Stiffness of left knee, not elsewhere classified  Chronic pain of left  knee     Problem List Patient Active Problem List   Diagnosis Date Noted  . Status post total left knee replacement 05/23/2020  . Primary osteoarthritis of left knee 05/05/2020  . 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 Fatty liver disease, nonalcoholic 51/03/5850  . 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  . Trigger finger, right middle finger 10/10/2018  . Other insomnia 06/23/2018  . Vitamin D deficiency 03/26/2018  . Prediabetes 03/11/2018  . Obesity 02/26/2018  . Migraines 10/01/2016  . Decreased cardiac ejection fraction 10/01/2016  . Severe obesity ([place]) 10/01/2016  . Shingles 08/03/2016  . Chronic pain of right knee 05/24/2016  . Unilateral primary osteoarthritis, right knee 05/24/2016  . Posterior tibial tendinitis, right leg 01/30/2016  . Lichen sclerosus 77/82/4235    Silvestre Mesi 07/20/2020, 3:13 PM  [facility] [address] [place], [place], [address] Phone: [phone]   Fax:  [phone]  Name: ESTEE YOHE MRN: [id number] Date of Birth: 23-Jul-1960

## 2020-07-22 ENCOUNTER — Ambulatory Visit (INDEPENDENT_AMBULATORY_CARE_PROVIDER_SITE_OTHER): Payer: BC Managed Care – PPO | Admitting: Rehabilitative and Restorative Service Providers"

## 2020-07-22 ENCOUNTER — Encounter: Payer: Self-pay | Admitting: Rehabilitative and Restorative Service Providers"

## 2020-07-22 ENCOUNTER — Other Ambulatory Visit: Payer: Self-pay

## 2020-07-22 ENCOUNTER — Ambulatory Visit (INDEPENDENT_AMBULATORY_CARE_PROVIDER_SITE_OTHER): Payer: BC Managed Care – PPO | Admitting: Orthopaedic Surgery

## 2020-07-22 ENCOUNTER — Encounter: Payer: Self-pay | Admitting: Orthopaedic Surgery

## 2020-07-22 ENCOUNTER — Ambulatory Visit: Payer: Self-pay

## 2020-07-22 DIAGNOSIS — M25662 Stiffness of left knee, not elsewhere classified: Secondary | ICD-10-CM

## 2020-07-22 DIAGNOSIS — M25562 Pain in left knee: Secondary | ICD-10-CM

## 2020-07-22 DIAGNOSIS — M6281 Muscle weakness (generalized): Secondary | ICD-10-CM | POA: Diagnosis not present

## 2020-07-22 DIAGNOSIS — R262 Difficulty in walking, not elsewhere classified: Secondary | ICD-10-CM

## 2020-07-22 DIAGNOSIS — R600 Localized edema: Secondary | ICD-10-CM | POA: Diagnosis not present

## 2020-07-22 DIAGNOSIS — G8929 Other chronic pain: Secondary | ICD-10-CM

## 2020-07-22 DIAGNOSIS — M25561 Pain in right knee: Secondary | ICD-10-CM

## 2020-07-22 NOTE — Patient Instructions (Signed)
Access Code: [email or web address] URL: [email or web address] Date: 07/22/2020 Prepared by: Vista Mink  Exercises Supine Quadricep Sets - 3-5 x daily - 7 x weekly - 3-5 sets - 10 reps - 5 second hold Small Range Straight Leg Raise - 1-2 x daily - 7 x weekly - 4-10 sets - 5 reps - 3 seconds hold Seated Knee Flexion AAROM - 2-3 x daily - 7 x weekly - 1 sets - 1 reps - 3 minutes hold

## 2020-07-22 NOTE — Therapy (Signed)
[facility] [address] [place], [place], [address] Phone: [phone]   Fax:  [phone]  Physical Therapy Treatment  Patient Details  Name: Savannah [name] MRN: [id number] Date of Birth: [birth year] Referring Provider (PT): Frankey Shown MD   Encounter Date: 07/22/2020   PT End of Session - 07/22/20 1651     Visit Number 15    Number of Visits 24    PT Start Time 1430    PT Stop Time 8676    PT Time Calculation (min) 55 min    Activity Tolerance Patient tolerated treatment well    Behavior During Therapy [facility] for tasks assessed/performed             Past Medical History:  Diagnosis Date   Arthritis    bilateral knees, recent LCL sprain-on left knee   CHF (congestive heart failure) ([place])    Dysrhythmia    trigem   Environmental allergies    Family history of adverse reaction to anesthesia    slow to wake up   Fatty liver    Headache(784.0)    migraines-on meds for control, relpax, ketoprophen, vicoden, muscle relaxer   Hearing loss 2016   High frequency hearing - Bilateral    History of hiatal hernia    Hot flashes    Joint pain    Lichen sclerosus    Lipoma    Right Knee   Menorrhagia    Migraines    Necrobiosis lipoidica    Osteoarthritis    Overweight    Pelvic pain in female    PONV (postoperative nausea and vomiting)    ponv, slow to awake   Seasonal allergies    Sinus problem    Sprain 10/25/2010   left knee   Trigeminal pulse    Trigger finger, right    Middle finger and right wrist    Vitamin D deficiency     Past Surgical History:  Procedure Laterality Date   APPENDECTOMY     CHOLECYSTECTOMY     DIAGNOSTIC LAPAROSCOPY  08/2006   DILATION AND CURETTAGE OF UTERUS  2011   attempted ablation   FUNCTIONAL ENDOSCOPIC SINUS SURGERY     ganglion cyst removal     HYSTEROSCOPY     failed   KNEE ARTHROPLASTY Right 2018   meniscus   LAPAROSCOPIC GASTRIC SLEEVE RESECTION  01/2020   LASIK     LIPOMA EXCISION  Right 07/22/2014   Procedure: EXCISION RIGHT THIGH LIPOMA;  Surgeon: Erroll Luna, MD;  Location: [place];  Service: General;  Laterality: Right;   ROBOTIC ASSISTED TOTAL HYSTERECTOMY  11/06/10   TLH/RSO   SALPINGOOPHORECTOMY  11/06/2010   Procedure: SALPINGO OOPHERECTOMY;  Surgeon: Felipa Emory;  Location: [facility] [place] [facility];  Service: Gynecology;  Laterality: Right;   TOTAL KNEE ARTHROPLASTY Left 05/23/2020   Procedure: LEFT TOTAL KNEE ARTHROPLASTY;  Surgeon: Leandrew Koyanagi, MD;  Location: [place];  Service: Orthopedics;  Laterality: Left;   TRIGGER FINGER RELEASE     UPPER GI ENDOSCOPY N/A 02/02/2020   Procedure: UPPER GI ENDOSCOPY;  Surgeon: [name], MD;  Location[facility];  Service: General;  Laterality: N/A;    There were no vitals filed for this visit.   Subjective Assessment - 07/22/20 1430     Subjective Savannah [name] notes she got an injection in her R knee.  L knee is a bit swollen due to increased WB and a fall this morning.  She is wearing compression hose on the  L.  Due to a fall today her L knee is more sore than normal.    Patient is accompained by: Family member    Pertinent History R knee scope 2018, Bariatric surgery, CHF    Limitations Sitting;House hold activities;Standing;Walking    How long can you sit comfortably? Stiffness with standing after prolonged sitting    How long can you stand comfortably? 15-20 minutes (was 10-15 minutes)    How long can you walk comfortably? 30-45 minutes (was 20-25 minutes)    Patient Stated Goals Walk better without the cane for yard work and return to work    Currently in Pain? Yes    Pain Score 5     Pain Location Knee    Pain Orientation Left    Pain Descriptors / Indicators Tightness;Sore;Aching;Sharp    Pain Type Chronic pain;Surgical pain    Pain Radiating Towards NA    Pain Onset More than a month ago    Pain Frequency Constant    Aggravating Factors  WB too long    Pain Relieving Factors Ice, pain meds and exercises     Effect of Pain on Daily Activities Working part-time from home    Multiple Pain Sites No                               [facility] Adult PT Treatment/Exercise - 07/22/20 0001       Neuro Re-ed    Neuro Re-ed Details  Tandem balance eyes open and closed 3X 30 seconds each, dynamic tandem balance and with head turning      Exercises   Exercises Knee/Hip      Knee/Hip Exercises: Aerobic   Recumbent Bike Seat 6 for 8 minutes      Knee/Hip Exercises: Machines for Strengthening   Total Gym Leg Press 112# 2 sets of 15, 56# L LE only 2 sets of 15      Knee/Hip Exercises: Seated   Long Arc Quad Strengthening;Both;3 sets;5 reps;Limitations    Long Arc Quad Limitations Seated straight leg raises    Other Seated Knee/Hip Exercises Tailgate knee flexion 2 minutes    Other Seated Knee/Hip Exercises Knee flexion AAROM (R pushes L into flexion) 10X 10 seconds      Modalities   Modalities Vasopneumatic      Vasopneumatic   Number Minutes Vasopneumatic  10 minutes    Vasopnuematic Location  Knee    Vasopneumatic Pressure Medium    Vasopneumatic Temperature  34                    PT Education - 07/22/20 1438     Education Details Ran through a slightly modified program due to her fall and resulting soreness.    Person(s) Educated Patient    Methods Explanation;Demonstration;Tactile cues;Verbal cues    Comprehension Need further instruction;Verbal cues required;Returned demonstration;Verbalized understanding;Tactile cues required              PT Short Term Goals - 07/22/20 1439       PT SHORT TERM GOAL #1   Title Savannah [name] will report independence and compliance with her starter HEP addressing AROM and quadriceps strength impairments.    Status Achieved               PT Long Term Goals - 07/22/20 1439       PT LONG TERM GOAL #1   Title Improve FOTO score to 59.  Baseline 40 at 06/30/20 RA, 35 at evaluation    Time 8    Period Weeks    Status  On-going      PT LONG TERM GOAL #2   Title Improve L knee pain to consistently 0-3/10 on the Numeric Pain Rating Scale.    Baseline Can be 5+/10    Time 8    Status On-going      PT LONG TERM GOAL #3   Title Savannah [name] will be able to walk 1/4 mile or more from her car to her office without an assistive device or complaints of functional difficulty.    Baseline Can walk but 1/4 mile without an AD    Time 8    Period Weeks    Status Achieved      PT LONG TERM GOAL #4   Title Improve L knee AROM for extension to -3 degrees or better and flexion to 110 degrees or better by DC.    Baseline -7 to 100 degrees    Time 8    Period Weeks    Status On-going      PT LONG TERM GOAL #5   Title Savannah [name] will be independent and compliant with her long-term HEP at DC.    Status On-going                   Plan - 07/22/20 1652     Clinical Impression Statement Due to a fall this morning and a cortisone shot in her R knee, we modified some activities today.  Added seated straight leg raises to her HEP with recommendations to focus on 4 exercises at home (see patient instructions) emphasizing extension AROM and strength, edema control and flexion AROM.  Continue POC to meet remaining LTGs before she returns to work full-time in July.    Personal Factors and Comorbidities Comorbidity 3+    Comorbidities Previous R knee scope in 2018, bariatric surgery, CHF    Examination-Activity Limitations Bathing;Dressing;Sit;Transfers;Bed Mobility;Sleep;Bend;Lift;Squat;Locomotion Level;Stairs;Carry;Stand    Examination-Participation Restrictions Interpersonal Relationship;Occupation;Yard Work;Community Activity    Stability/Clinical Decision Making Stable/Uncomplicated    Rehab Potential Good    PT Frequency 3x / week    PT Duration 8 weeks    PT Treatment/Interventions ADLs/Self Care Home Management;Electrical Stimulation;Cryotherapy;Therapeutic activities;Stair training;Gait training;Therapeutic  exercise;Balance training;Neuromuscular re-education;Patient/family education;Manual techniques;Vasopneumatic Device    PT Next Visit Plan Knee ROM, Progress quadriceps strength, leg press, knee extension machine, lateral step up, STS to lower height    PT Home Exercise Plan Access Code: [email or web address]    Consulted and Agree with Plan of Care Patient;Family member/caregiver             Patient will benefit from skilled therapeutic intervention in order to improve the following deficits and impairments:  Abnormal gait, Decreased activity tolerance, Decreased balance, Decreased endurance, Decreased range of motion, Decreased strength, Difficulty walking, Increased edema, Impaired flexibility, Pain, Obesity  Visit Diagnosis: Difficulty walking  Muscle weakness (generalized)  Localized edema  Stiffness of left knee, not elsewhere classified  Chronic pain of left knee     Problem List Patient Active Problem List   Diagnosis Date Noted   Status post total left knee replacement 05/23/2020   Primary osteoarthritis of left knee 05/05/2020   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   Fatty liver disease, nonalcoholic 86/76/7209   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   Trigger finger, right middle finger 10/10/2018   Other insomnia 06/23/2018   Vitamin D deficiency 03/26/2018   Prediabetes 03/11/2018  Obesity 02/26/2018   Migraines 10/01/2016   Decreased cardiac ejection fraction 10/01/2016   Severe obesity ([place]) 10/01/2016   Shingles 08/03/2016   Chronic pain of right knee 05/24/2016   Unilateral primary osteoarthritis, right knee 05/24/2016   Posterior tibial tendinitis, right leg 07/13/5613   Lichen sclerosus 37/94/3276    Farley Ly PT, MPT 07/22/2020, 4:55 PM  [facility] [address] [place], [place], [address] Phone: [phone]   Fax:  [phone]  Name: Savannah [name] MRN: [id number] Date of Birth:  24-Sep-1960

## 2020-07-22 NOTE — Progress Notes (Signed)
Office Visit Note   Patient: Savannah [name]           Date of Birth: March 21, 1960           MRN: [id number] Visit Date: 07/22/2020              Requested by: Lawerance Cruel, [place],  [place] [address] PCP: Lawerance Cruel, MD   Assessment & Plan: Visit Diagnoses:  1. Acute pain of right knee     Plan: Based on findings I think she likely had an exacerbation of her underlying right knee DJD.  I do not think that this is related to an IT band issue.  She has a fair amount of patellofemoral arthritis especially on the lateral side.  Based on treatment options that we discussed she agreed to undergo a right knee injection today.  She will also take it easy and use topical NSAIDs.  Follow-up as scheduled for the left total knee replacement.  Follow-Up Instructions: No follow-ups on file.   Orders:  Orders Placed This Encounter  Procedures   XR KNEE 3 VIEW RIGHT   No orders of the defined types were placed in this encounter.     Procedures: Large Joint Inj: R knee on 07/23/2020 12:56 PM Indications: pain Details: 22 G needle  Arthrogram: No  Medications: 40 mg methylPREDNISolone acetate 40 MG/ML; 2 mL lidocaine 1 %; 2 mL bupivacaine 0.5 % Consent was given by the patient. Patient was prepped and draped in the usual sterile fashion.      Clinical Data: No additional findings.   Subjective: Chief Complaint  Patient presents with   Right Knee - Pain    HPI  Review of Systems   Objective: Vital Signs: LMP 10/14/2010   Physical Exam  Ortho Exam Right knee shows no joint effusion.  Collaterals and cruciates are stable.  On no crepitus over the IT band.  Tenderness in the lateral gastroc.  No joint line tenderness.  Negative McMurray.  Range of motion is fairly normal. Specialty Comments:  No specialty comments available.  Imaging: No results found.   PMFS History: Patient Active Problem List   Diagnosis Date Noted   Status post  total left knee replacement 05/23/2020   Primary osteoarthritis of left knee 05/05/2020   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   Fatty liver disease, nonalcoholic 59/93/5701   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   Trigger finger, right middle finger 10/10/2018   Other insomnia 06/23/2018   Vitamin D deficiency 03/26/2018   Prediabetes 03/11/2018   Obesity 02/26/2018   Migraines 10/01/2016   Decreased cardiac ejection fraction 10/01/2016   Severe obesity ([place]) 10/01/2016   Shingles 08/03/2016   Chronic pain of right knee 05/24/2016   Unilateral primary osteoarthritis, right knee 05/24/2016   Posterior tibial tendinitis, right leg 77/93/9030   Lichen sclerosus 11/04/3005   Past Medical History:  Diagnosis Date   Arthritis    bilateral knees, recent LCL sprain-on left knee   CHF (congestive heart failure) ([place])    Dysrhythmia    trigem   Environmental allergies    Family history of adverse reaction to anesthesia    slow to wake up   Fatty liver    Headache(784.0)    migraines-on meds for control, relpax, ketoprophen, vicoden, muscle relaxer   Hearing loss 2016   High frequency hearing - Bilateral    History of hiatal hernia    Hot flashes  Joint pain    Lichen sclerosus    Lipoma    Right Knee   Menorrhagia    Migraines    Necrobiosis lipoidica    Osteoarthritis    Overweight    Pelvic pain in female    PONV (postoperative nausea and vomiting)    ponv, slow to awake   Seasonal allergies    Sinus problem    Sprain 10/25/2010   left knee   Trigeminal pulse    Trigger finger, right    Middle finger and right wrist    Vitamin D deficiency     Family History  Problem Relation Age of Onset   Skin cancer Mother    Migraines Mother    High blood pressure Mother    Heart disease Father    High blood pressure Father    High Cholesterol Father    Breast cancer Maternal Aunt 70   Breast cancer Paternal Aunt 65   Migraines Brother     Past  Surgical History:  Procedure Laterality Date   APPENDECTOMY     CHOLECYSTECTOMY     DIAGNOSTIC LAPAROSCOPY  08/2006   DILATION AND CURETTAGE OF UTERUS  2011   attempted ablation   FUNCTIONAL ENDOSCOPIC SINUS SURGERY     ganglion cyst removal     HYSTEROSCOPY     failed   KNEE ARTHROPLASTY Right 2018   meniscus   LAPAROSCOPIC GASTRIC SLEEVE RESECTION  01/2020   LASIK     LIPOMA EXCISION Right 07/22/2014   Procedure: EXCISION RIGHT THIGH LIPOMA;  Surgeon: Erroll Luna, MD;  Location: [place];  Service: General;  Laterality: Right;   ROBOTIC ASSISTED TOTAL HYSTERECTOMY  11/06/10   TLH/RSO   SALPINGOOPHORECTOMY  11/06/2010   Procedure: SALPINGO OOPHERECTOMY;  Surgeon: Felipa Emory;  Location: [facility];  Service: Gynecology;  Laterality: Right;   TOTAL KNEE ARTHROPLASTY Left 05/23/2020   Procedure: LEFT TOTAL KNEE ARTHROPLASTY;  Surgeon: Leandrew Koyanagi, MD;  Location: [place];  Service: Orthopedics;  Laterality: Left;   TRIGGER FINGER RELEASE     UPPER GI ENDOSCOPY N/A 02/02/2020   Procedure: UPPER GI ENDOSCOPY;  Surgeon: [name], MD;  Location[facility];  Service: General;  Laterality: N/A;   Social History   Occupational History   Occupation: Administrator, [employer]  Tobacco Use   Smoking status: Never   Smokeless tobacco: Never  Vaping Use   Vaping Use: Never used  Substance and Sexual Activity   Alcohol use: No    Alcohol/week: 0.0 standard drinks   Drug use: No   Sexual activity: Yes    Birth control/protection: Surgical    Comment: TLH/RSO

## 2020-07-23 ENCOUNTER — Other Ambulatory Visit: Payer: Self-pay | Admitting: Cardiovascular Disease

## 2020-07-23 DIAGNOSIS — M25561 Pain in right knee: Secondary | ICD-10-CM

## 2020-07-23 MED ORDER — LIDOCAINE HCL 1 % IJ SOLN
2.0000 mL | INTRAMUSCULAR | Status: AC | PRN
  Administered 2020-07-23: 2 mL

## 2020-07-23 MED ORDER — METHYLPREDNISOLONE ACETATE 40 MG/ML IJ SUSP
40.0000 mg | INTRAMUSCULAR | Status: AC | PRN
  Administered 2020-07-23: 40 mg via INTRA_ARTICULAR

## 2020-07-23 MED ORDER — BUPIVACAINE HCL 0.5 % IJ SOLN
2.0000 mL | INTRAMUSCULAR | Status: AC | PRN
Start: 2020-07-23 — End: 2020-07-23
  Administered 2020-07-23: 2 mL via INTRA_ARTICULAR

## 2020-07-25 ENCOUNTER — Other Ambulatory Visit: Payer: Self-pay

## 2020-07-25 ENCOUNTER — Ambulatory Visit (INDEPENDENT_AMBULATORY_CARE_PROVIDER_SITE_OTHER): Payer: BC Managed Care – PPO | Admitting: Physical Therapy

## 2020-07-25 DIAGNOSIS — M25562 Pain in left knee: Secondary | ICD-10-CM

## 2020-07-25 DIAGNOSIS — R262 Difficulty in walking, not elsewhere classified: Secondary | ICD-10-CM | POA: Diagnosis not present

## 2020-07-25 DIAGNOSIS — M6281 Muscle weakness (generalized): Secondary | ICD-10-CM

## 2020-07-25 DIAGNOSIS — G8929 Other chronic pain: Secondary | ICD-10-CM

## 2020-07-25 DIAGNOSIS — M25662 Stiffness of left knee, not elsewhere classified: Secondary | ICD-10-CM | POA: Diagnosis not present

## 2020-07-25 DIAGNOSIS — R600 Localized edema: Secondary | ICD-10-CM | POA: Diagnosis not present

## 2020-07-25 NOTE — Therapy (Signed)
[facility] Therapy [address] [place], [place], [address] Phone: [phone]   Fax:  [phone]  Physical Therapy Treatment  Patient Details  Name: Savannah [name] MRN: [id number] Date of Birth: 1960/05/20 Referring Provider (PT): Frankey Shown MD   Encounter Date: 07/25/2020   PT End of Session - 07/25/20 1511     Visit Number 16    Number of Visits 24    PT Start Time 1430    PT Stop Time 2505    PT Time Calculation (min) 51 min    Activity Tolerance Patient tolerated treatment well    Behavior During Therapy [facility] for tasks assessed/performed             Past Medical History:  Diagnosis Date   Arthritis    bilateral knees, recent LCL sprain-on left knee   CHF (congestive heart failure) ([place])    Dysrhythmia    trigem   Environmental allergies    Family history of adverse reaction to anesthesia    slow to wake up   Fatty liver    Headache(784.0)    migraines-on meds for control, relpax, ketoprophen, vicoden, muscle relaxer   Hearing loss 2016   High frequency hearing - Bilateral    History of hiatal hernia    Hot flashes    Joint pain    Lichen sclerosus    Lipoma    Right Knee   Menorrhagia    Migraines    Necrobiosis lipoidica    Osteoarthritis    Overweight    Pelvic pain in female    PONV (postoperative nausea and vomiting)    ponv, slow to awake   Seasonal allergies    Sinus problem    Sprain 10/25/2010   left knee   Trigeminal pulse    Trigger finger, right    Middle finger and right wrist    Vitamin D deficiency     Past Surgical History:  Procedure Laterality Date   APPENDECTOMY     CHOLECYSTECTOMY     DIAGNOSTIC LAPAROSCOPY  08/2006   DILATION AND CURETTAGE OF UTERUS  2011   attempted ablation   FUNCTIONAL ENDOSCOPIC SINUS SURGERY     ganglion cyst removal     HYSTEROSCOPY     failed   KNEE ARTHROPLASTY Right 2018   meniscus   LAPAROSCOPIC GASTRIC SLEEVE RESECTION  01/2020   LASIK     LIPOMA EXCISION  Right 07/22/2014   Procedure: EXCISION RIGHT THIGH LIPOMA;  Surgeon: Erroll Luna, MD;  Location: [place];  Service: General;  Laterality: Right;   ROBOTIC ASSISTED TOTAL HYSTERECTOMY  11/06/10   TLH/RSO   SALPINGOOPHORECTOMY  11/06/2010   Procedure: SALPINGO OOPHERECTOMY;  Surgeon: Felipa Emory;  Location: [facility];  Service: Gynecology;  Laterality: Right;   TOTAL KNEE ARTHROPLASTY Left 05/23/2020   Procedure: LEFT TOTAL KNEE ARTHROPLASTY;  Surgeon: Leandrew Koyanagi, MD;  Location: [place];  Service: Orthopedics;  Laterality: Left;   TRIGGER FINGER RELEASE     UPPER GI ENDOSCOPY N/A 02/02/2020   Procedure: UPPER GI ENDOSCOPY;  Surgeon: [name], MD;  Location: [facility];  Service: General;  Laterality: N/A;    There were no vitals filed for this visit.   Subjective Assessment - 07/25/20 1429     Subjective She relays it has been a rough couple of days after falling on friday when she tripped and fell on her both of her knees. She relays 5/10 pain in her Lt knee and  that her knee is very stiff.    Patient is accompained by: Family member    Pertinent History R knee scope 2018, Bariatric surgery, CHF    Limitations Sitting;House hold activities;Standing;Walking    How long can you sit comfortably? Stiffness with standing after prolonged sitting    How long can you stand comfortably? 15-20 minutes (was 10-15 minutes)    How long can you walk comfortably? 30-45 minutes (was 20-25 minutes)    Patient Stated Goals Walk better without the cane for yard work and return to work    Pain Onset More than a month ago              [facility] Adult PT Treatment/Exercise - 07/25/20 0001       Knee/Hip Exercises: Stretches   Active Hamstring Stretch Left;3 reps;30 seconds    Active Hamstring Stretch Limitations standing with Lt foot DF on treadmill    Knee: Self-Stretch Limitations seated tailgate stretch 10 sec X 10, standing lunge stretch 5 sec X10 at treadmill    Gastroc Stretch  Both;3 reps;30 seconds      Knee/Hip Exercises: Aerobic   Nustep L5X10 min      Knee/Hip Exercises: Machines for Strengthening   Cybex Knee Extension 10# 2 sets of 15 up with both, down with L only slow eccentrics    Cybex Knee Flexion 35#bilat X10 then 25# 2X10    Total Gym Leg Press 112# 2 sets of 10, 56# L LE only 2 sets of 15      Knee/Hip Exercises: Standing   Forward Step Up Left;Step Height: 6";15 reps;Hand Hold: 1      Vasopneumatic   Number Minutes Vasopneumatic  10 minutes    Vasopnuematic Location  Knee    Vasopneumatic Pressure Medium    Vasopneumatic Temperature  34      Manual Therapy   Manual therapy comments Lt knee PROM flexion and extension and mobs into extension,                      PT Short Term Goals - 07/22/20 1439       PT SHORT TERM GOAL #1   Title [name] will report independence and compliance with her starter HEP addressing AROM and quadriceps strength impairments.    Status Achieved               PT Long Term Goals - 07/22/20 1439       PT LONG TERM GOAL #1   Title Improve FOTO score to 59.    Baseline 40 at 06/30/20 RA, 35 at evaluation    Time 8    Period Weeks    Status On-going      PT LONG TERM GOAL #2   Title Improve L knee pain to consistently 0-3/10 on the Numeric Pain Rating Scale.    Baseline Can be 5+/10    Time 8    Status On-going      PT LONG TERM GOAL #3   Title [name] will be able to walk 1/4 mile or more from her car to her office without an assistive device or complaints of functional difficulty.    Baseline Can walk but 1/4 mile without an AD    Time 8    Period Weeks    Status Achieved      PT LONG TERM GOAL #4   Title Improve L knee AROM for extension to -3 degrees or better and flexion to 110 degrees  or better by DC.    Baseline -7 to 100 degrees    Time 8    Period Weeks    Status On-going      PT LONG TERM GOAL #5   Title [name] will be independent and compliant with her long-term HEP at  DC.    Status On-going                   Plan - 07/25/20 1512     Clinical Impression Statement She was still having some pain and swelling in her Lt knee from fall over the weekend but she does not appear to have significant injury from this and she was able to tolerate her usual strength and ROM program. Continue POC with focus on improving knee ROM and strength.    Personal Factors and Comorbidities Comorbidity 3+    Comorbidities Previous R knee scope in 2018, bariatric surgery, CHF    Examination-Activity Limitations Bathing;Dressing;Sit;Transfers;Bed Mobility;Sleep;Bend;Lift;Squat;Locomotion Level;Stairs;Carry;Stand    Examination-Participation Restrictions Interpersonal Relationship;Occupation;Yard Work;Community Activity    Stability/Clinical Decision Making Stable/Uncomplicated    Rehab Potential Good    PT Frequency 3x / week    PT Duration 8 weeks    PT Treatment/Interventions ADLs/Self Care Home Management;Electrical Stimulation;Cryotherapy;Therapeutic activities;Stair training;Gait training;Therapeutic exercise;Balance training;Neuromuscular re-education;Patient/family education;Manual techniques;Vasopneumatic Device    PT Next Visit Plan Knee ROM, Progress quadriceps strength, leg press, knee extension machine, lateral step up, STS to lower height    PT Home Exercise Plan Access Code: [email or web address]    Consulted and Agree with Plan of Care Patient;Family member/caregiver             Patient will benefit from skilled therapeutic intervention in order to improve the following deficits and impairments:  Abnormal gait, Decreased activity tolerance, Decreased balance, Decreased endurance, Decreased range of motion, Decreased strength, Difficulty walking, Increased edema, Impaired flexibility, Pain, Obesity  Visit Diagnosis: Difficulty walking  Muscle weakness (generalized)  Localized edema  Stiffness of left knee, not elsewhere classified  Chronic pain of left  knee     Problem List Patient Active Problem List   Diagnosis Date Noted   Status post total left knee replacement 05/23/2020   Primary osteoarthritis of left knee 05/05/2020   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   Fatty liver disease, nonalcoholic 27/07/2374   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   Trigger finger, right middle finger 10/10/2018   Other insomnia 06/23/2018   Vitamin D deficiency 03/26/2018   Prediabetes 03/11/2018   Obesity 02/26/2018   Migraines 10/01/2016   Decreased cardiac ejection fraction 10/01/2016   Severe obesity ([place]) 10/01/2016   Shingles 08/03/2016   Chronic pain of right knee 05/24/2016   Unilateral primary osteoarthritis, right knee 05/24/2016   Posterior tibial tendinitis, right leg 28/31/5176   Lichen sclerosus 16/08/3708    Silvestre Mesi 07/25/2020, 3:14 PM  [facility] Physical Therapy [address] [place], [place], [address] Phone: [phone]   Fax:  [phone]  Name: DEIDREA GAETZ MRN: [id number] Date of Birth: [birth year]

## 2020-07-27 ENCOUNTER — Other Ambulatory Visit: Payer: Self-pay

## 2020-07-27 ENCOUNTER — Ambulatory Visit (INDEPENDENT_AMBULATORY_CARE_PROVIDER_SITE_OTHER): Payer: BC Managed Care – PPO | Admitting: Physical Therapy

## 2020-07-27 DIAGNOSIS — R262 Difficulty in walking, not elsewhere classified: Secondary | ICD-10-CM

## 2020-07-27 DIAGNOSIS — R600 Localized edema: Secondary | ICD-10-CM

## 2020-07-27 DIAGNOSIS — M6281 Muscle weakness (generalized): Secondary | ICD-10-CM | POA: Diagnosis not present

## 2020-07-27 DIAGNOSIS — M25662 Stiffness of left knee, not elsewhere classified: Secondary | ICD-10-CM | POA: Diagnosis not present

## 2020-07-27 NOTE — Therapy (Signed)
[facility] Physical Therapy [address] [place], [place], [address] Phone: [phone]   Fax:  [phone]  Physical Therapy Treatment  Patient Details  Name: Savannah [name] MRN: [id number] Date of Birth: [birth year] Referring Provider (PT): Frankey Shown MD   Encounter Date: 07/27/2020   PT End of Session - 07/27/20 1500     Visit Number 17    Number of Visits 24    PT Start Time 0086    PT Stop Time 7619    PT Time Calculation (min) 48 min    Activity Tolerance Patient tolerated treatment well    Behavior During Therapy [facility] for tasks assessed/performed             Past Medical History:  Diagnosis Date   Arthritis    bilateral knees, recent LCL sprain-on left knee   CHF (congestive heart failure) ([place])    Dysrhythmia    trigem   Environmental allergies    Family history of adverse reaction to anesthesia    slow to wake up   Fatty liver    Headache(784.0)    migraines-on meds for control, relpax, ketoprophen, vicoden, muscle relaxer   Hearing loss 2016   High frequency hearing - Bilateral    History of hiatal hernia    Hot flashes    Joint pain    Lichen sclerosus    Lipoma    Right Knee   Menorrhagia    Migraines    Necrobiosis lipoidica    Osteoarthritis    Overweight    Pelvic pain in female    PONV (postoperative nausea and vomiting)    ponv, slow to awake   Seasonal allergies    Sinus problem    Sprain 10/25/2010   left knee   Trigeminal pulse    Trigger finger, right    Middle finger and right wrist    Vitamin D deficiency     Past Surgical History:  Procedure Laterality Date   APPENDECTOMY     CHOLECYSTECTOMY     DIAGNOSTIC LAPAROSCOPY  08/2006   DILATION AND CURETTAGE OF UTERUS  2011   attempted ablation   FUNCTIONAL ENDOSCOPIC SINUS SURGERY     ganglion cyst removal     HYSTEROSCOPY     failed   KNEE ARTHROPLASTY Right 2018   meniscus   LAPAROSCOPIC GASTRIC SLEEVE RESECTION  01/2020   LASIK     LIPOMA EXCISION  Right 07/22/2014   Procedure: EXCISION RIGHT THIGH LIPOMA;  Surgeon: Erroll Luna, MD;  Location: [place];  Service: General;  Laterality: Right;   ROBOTIC ASSISTED TOTAL HYSTERECTOMY  11/06/10   TLH/RSO   SALPINGOOPHORECTOMY  11/06/2010   Procedure: SALPINGO OOPHERECTOMY;  Surgeon: Felipa Emory;  Location: [facility];  Service: Gynecology;  Laterality: Right;   TOTAL KNEE ARTHROPLASTY Left 05/23/2020   Procedure: LEFT TOTAL KNEE ARTHROPLASTY;  Surgeon: Leandrew Koyanagi, MD;  Location: [place];  Service: Orthopedics;  Laterality: Left;   TRIGGER FINGER RELEASE     UPPER GI ENDOSCOPY N/A 02/02/2020   Procedure: UPPER GI ENDOSCOPY;  Surgeon: [name], MD;  Location: [facility];  Service: General;  Laterality: N/A;    There were no vitals filed for this visit.   Subjective Assessment - 07/27/20 1438     Subjective Overall her Lt knee pain and stiffness are a little better, 4/10 overall today upon arrival.    Patient is accompained by: Family member    Pertinent History R knee scope 2018,  Bariatric surgery, CHF    Limitations Sitting;House hold activities;Standing;Walking    How long can you sit comfortably? Stiffness with standing after prolonged sitting    How long can you stand comfortably? 15-20 minutes (was 10-15 minutes)    How long can you walk comfortably? 30-45 minutes (was 20-25 minutes)    Patient Stated Goals Walk better without the cane for yard work and return to work    Pain Onset More than a month ago              [facility] Adult PT Treatment/Exercise - 07/27/20 0001       Knee/Hip Exercises: Stretches   Active Hamstring Stretch Left;3 reps;30 seconds    Active Hamstring Stretch Limitations standing with Lt foot DF on treadmill    Knee: Self-Stretch Limitations seated tailgate stretch 10 sec X 10, standing lunge stretch 5 sec X10 at treadmill    Gastroc Stretch Supine TKE stretch Both;3 reps;30 seconds  5 min  r   Knee/Hip Exercises: Aerobic   Nustep  L5X10 min      Knee/Hip Exercises: Machines for Strengthening   Cybex Knee Extension 10# 2 sets of 15 up with both, down with L only slow eccentrics    Cybex Knee Flexion 35#bilat X10 then 25# 2X10    Total Gym Leg Press 112# 3 sets of 10, 56# L LE only 2 sets of 15      Knee/Hip Exercises: Standing   Lateral Step Up Left;10 reps;Hand Hold: 1;Step Height: 6"    Forward Step Up Left;Step Height: 6";Hand Hold: 1;10 reps      Vasopneumatic   Number Minutes Vasopneumatic  10 minutes    Vasopnuematic Location  Knee    Vasopneumatic Pressure Medium    Vasopneumatic Temperature  34      Manual Therapy   Manual therapy comments Lt knee PROM flexion and extension and mobs into extension,                      PT Short Term Goals - 07/22/20 1439       PT SHORT TERM GOAL #1   Title Savannah [name] will report independence and compliance with her starter HEP addressing AROM and quadriceps strength impairments.    Status Achieved               PT Long Term Goals - 07/22/20 1439       PT LONG TERM GOAL #1   Title Improve FOTO score to 59.    Baseline 40 at 06/30/20 RA, 35 at evaluation    Time 8    Period Weeks    Status On-going      PT LONG TERM GOAL #2   Title Improve L knee pain to consistently 0-3/10 on the Numeric Pain Rating Scale.    Baseline Can be 5+/10    Time 8    Status On-going      PT LONG TERM GOAL #3   Title Savannah [name] will be able to walk 1/4 mile or more from her car to her office without an assistive device or complaints of functional difficulty.    Baseline Can walk but 1/4 mile without an AD    Time 8    Period Weeks    Status Achieved      PT LONG TERM GOAL #4   Title Improve L knee AROM for extension to -3 degrees or better and flexion to 110 degrees or better by DC.  Baseline -7 to 100 degrees    Time 8    Period Weeks    Status On-going      PT LONG TERM GOAL #5   Title Savannah [name] will be independent and compliant with her long-term HEP at DC.     Status On-going                   Plan - 07/27/20 1511     Clinical Impression Statement A little more ROM and less stiffness noted with manual therapy but still with ROM deficits. Her strengthening is progressing well. Continue POC.    Personal Factors and Comorbidities Comorbidity 3+    Comorbidities Previous R knee scope in 2018, bariatric surgery, CHF    Examination-Activity Limitations Bathing;Dressing;Sit;Transfers;Bed Mobility;Sleep;Bend;Lift;Squat;Locomotion Level;Stairs;Carry;Stand    Examination-Participation Restrictions Interpersonal Relationship;Occupation;Yard Work;Community Activity    Stability/Clinical Decision Making Stable/Uncomplicated    Rehab Potential Good    PT Frequency 3x / week    PT Duration 8 weeks    PT Treatment/Interventions ADLs/Self Care Home Management;Electrical Stimulation;Cryotherapy;Therapeutic activities;Stair training;Gait training;Therapeutic exercise;Balance training;Neuromuscular re-education;Patient/family education;Manual techniques;Vasopneumatic Device    PT Next Visit Plan Knee ROM, Progress quadriceps strength, leg press, knee extension machine, lateral step up, STS to lower height    PT Home Exercise Plan Access Code: [email or web address]    Consulted and Agree with Plan of Care Patient;Family member/caregiver             Patient will benefit from skilled therapeutic intervention in order to improve the following deficits and impairments:  Abnormal gait, Decreased activity tolerance, Decreased balance, Decreased endurance, Decreased range of motion, Decreased strength, Difficulty walking, Increased edema, Impaired flexibility, Pain, Obesity  Visit Diagnosis: Difficulty walking  Muscle weakness (generalized)  Localized edema  Stiffness of left knee, not elsewhere classified     Problem List Patient Active Problem List   Diagnosis Date Noted   Status post total left knee replacement 05/23/2020   Primary osteoarthritis of left  knee 05/05/2020   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   Fatty liver disease, nonalcoholic 88/50/2774   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   Trigger finger, right middle finger 10/10/2018   Other insomnia 06/23/2018   Vitamin D deficiency 03/26/2018   Prediabetes 03/11/2018   Obesity 02/26/2018   Migraines 10/01/2016   Decreased cardiac ejection fraction 10/01/2016   Severe obesity ([place]) 10/01/2016   Shingles 08/03/2016   Chronic pain of right knee 05/24/2016   Unilateral primary osteoarthritis, right knee 05/24/2016   Posterior tibial tendinitis, right leg 12/87/8676   Lichen sclerosus 72/10/4707    Silvestre Mesi 07/27/2020, 3:12 PM  [facility] Physical Therapy [address] [place], [place], [address] Phone: [phone]   Fax:  [phone]  Name: Savannah [name] MRN: [id number] Date of Birth: 06-06-1960

## 2020-07-29 ENCOUNTER — Encounter: Payer: Self-pay | Admitting: Rehabilitative and Restorative Service Providers"

## 2020-07-29 ENCOUNTER — Ambulatory Visit (INDEPENDENT_AMBULATORY_CARE_PROVIDER_SITE_OTHER): Payer: BC Managed Care – PPO | Admitting: Rehabilitative and Restorative Service Providers"

## 2020-07-29 ENCOUNTER — Other Ambulatory Visit: Payer: Self-pay

## 2020-07-29 DIAGNOSIS — M6281 Muscle weakness (generalized): Secondary | ICD-10-CM

## 2020-07-29 DIAGNOSIS — M25662 Stiffness of left knee, not elsewhere classified: Secondary | ICD-10-CM | POA: Diagnosis not present

## 2020-07-29 DIAGNOSIS — R600 Localized edema: Secondary | ICD-10-CM | POA: Diagnosis not present

## 2020-07-29 DIAGNOSIS — R262 Difficulty in walking, not elsewhere classified: Secondary | ICD-10-CM | POA: Diagnosis not present

## 2020-07-29 DIAGNOSIS — M25562 Pain in left knee: Secondary | ICD-10-CM

## 2020-07-29 DIAGNOSIS — G8929 Other chronic pain: Secondary | ICD-10-CM

## 2020-07-29 NOTE — Therapy (Signed)
[facility] Physical Therapy [address] [place], [place], [address] Phone: [phone]   Fax:  [phone]  Physical Therapy Treatment/Reassessment  Patient Details  Name: Savannah [name] MRN: [id number] Date of Birth: 12-28-1960 Referring Provider (PT): Frankey Shown MD   Encounter Date: 07/29/2020   PT End of Session - 07/29/20 1516     Visit Number 18    Number of Visits 24    PT Start Time 1430    PT Stop Time 4008    PT Time Calculation (min) 53 min    Activity Tolerance Patient tolerated treatment well    Behavior During Therapy [facility] for tasks assessed/performed             Past Medical History:  Diagnosis Date   Arthritis    bilateral knees, recent LCL sprain-on left knee   CHF (congestive heart failure) ([place])    Dysrhythmia    trigem   Environmental allergies    Family history of adverse reaction to anesthesia    slow to wake up   Fatty liver    Headache(784.0)    migraines-on meds for control, relpax, ketoprophen, vicoden, muscle relaxer   Hearing loss 2016   High frequency hearing - Bilateral    History of hiatal hernia    Hot flashes    Joint pain    Lichen sclerosus    Lipoma    Right Knee   Menorrhagia    Migraines    Necrobiosis lipoidica    Osteoarthritis    Overweight    Pelvic pain in female    PONV (postoperative nausea and vomiting)    ponv, slow to awake   Seasonal allergies    Sinus problem    Sprain 10/25/2010   left knee   Trigeminal pulse    Trigger finger, right    Middle finger and right wrist    Vitamin D deficiency     Past Surgical History:  Procedure Laterality Date   APPENDECTOMY     CHOLECYSTECTOMY     DIAGNOSTIC LAPAROSCOPY  08/2006   DILATION AND CURETTAGE OF UTERUS  2011   attempted ablation   FUNCTIONAL ENDOSCOPIC SINUS SURGERY     ganglion cyst removal     HYSTEROSCOPY     failed   KNEE ARTHROPLASTY Right 2018   meniscus   LAPAROSCOPIC GASTRIC SLEEVE RESECTION  01/2020   LASIK      LIPOMA EXCISION Right 07/22/2014   Procedure: EXCISION RIGHT THIGH LIPOMA;  Surgeon: Erroll Luna, MD;  Location: [place];  Service: General;  Laterality: Right;   ROBOTIC ASSISTED TOTAL HYSTERECTOMY  11/06/10   TLH/RSO   SALPINGOOPHORECTOMY  11/06/2010   Procedure: SALPINGO OOPHERECTOMY;  Surgeon: Felipa Emory;  Location: [facility];  Service: Gynecology;  Laterality: Right;   TOTAL KNEE ARTHROPLASTY Left 05/23/2020   Procedure: LEFT TOTAL KNEE ARTHROPLASTY;  Surgeon: Leandrew Koyanagi, MD;  Location: [place];  Service: Orthopedics;  Laterality: Left;   TRIGGER FINGER RELEASE     UPPER GI ENDOSCOPY N/A 02/02/2020   Procedure: UPPER GI ENDOSCOPY;  Surgeon: [name], MD;  Location: [facility];  Service: General;  Laterality: N/A;    There were no vitals filed for this visit.   Subjective Assessment - 07/29/20 1434     Subjective Savannah [name] reports feeling "tight."  She has not needed an oxy all week.    Patient is accompained by: Family member    Pertinent History R knee scope 2018, Bariatric surgery, CHF  Limitations Sitting;House hold activities;Standing;Walking    How long can you sit comfortably? Stiffness with standing after prolonged sitting    How long can you stand comfortably? 15-20 minutes (was 10-15 minutes)    How long can you walk comfortably? 30-45 minutes (was 20-25 minutes)    Patient Stated Goals Walk better without the cane for yard work and return to work    Currently in Pain? Yes    Pain Score 3     Pain Location Knee    Pain Orientation Left    Pain Descriptors / Indicators Tightness    Pain Type Chronic pain;Surgical pain    Pain Radiating Towards NA    Pain Onset More than a month ago    Pain Frequency Constant    Aggravating Factors  WB too long    Pain Relieving Factors Ice and exercises    Effect of Pain on Daily Activities Working 4 hours per day at home    Multiple Pain Sites No                               [facility] Adult PT  Treatment/Exercise - 07/29/20 0001       Therapeutic Activites    Therapeutic Activities ADL's    ADL's Step-up and over 4 and 6 inch step      Neuro Re-ed    Neuro Re-ed Details  Tandem balance eyes open and with head turning 2X 20 seconds each      Exercises   Exercises Knee/Hip      Knee/Hip Exercises: Aerobic   Recumbent Bike Seat 6 for 8 minutes      Knee/Hip Exercises: Machines for Strengthening   Total Gym Leg Press 125# 2 sets of 15, 62# L LE only 2 sets of 15 full extension to slow eccentrics      Knee/Hip Exercises: Seated   Other Seated Knee/Hip Exercises Knee flexion AAROM (R pushes L into flexion) 10X 10 seconds      Knee/Hip Exercises: Supine   Quad Sets Strengthening;Both;2 sets;10 reps;Limitations    Quad Sets Limitations 5 seconds with PT overpressure      Modalities   Modalities Vasopneumatic      Vasopneumatic   Number Minutes Vasopneumatic  10 minutes    Vasopnuematic Location  Knee    Vasopneumatic Pressure Medium    Vasopneumatic Temperature  34                    PT Education - 07/29/20 1445     Education Details Savannah [name] continues to report good HEP compliance.  "Tightness" is most functionally limiting.    Person(s) Educated Patient    Methods Explanation;Demonstration;Tactile cues;Verbal cues    Comprehension Verbal cues required;Returned demonstration;Need further instruction;Verbalized understanding;Tactile cues required              PT Short Term Goals - 07/29/20 1446       PT SHORT TERM GOAL #1   Title Savannah [name] will report independence and compliance with her starter HEP addressing AROM and quadriceps strength impairments.    Status Achieved               PT Long Term Goals - 07/29/20 1456       PT LONG TERM GOAL #1   Title Improve FOTO score to 59.    Baseline 47 at 07/29/20 RA, 35 at evaluation    Time 8  Period Weeks    Status On-going      PT LONG TERM GOAL #2   Title Improve L knee pain to consistently  0-3/10 on the Numeric Pain Rating Scale.    Baseline Can be 5+/10    Time 8    Status Achieved      PT LONG TERM GOAL #3   Title Savannah [name] will be able to walk 1/4 mile or more from her car to her office without an assistive device or complaints of functional difficulty.    Baseline Can walk but 1/4 mile without an AD    Time 8    Period Weeks    Status Achieved      PT LONG TERM GOAL #4   Title Improve L knee AROM for extension to -3 degrees or better and flexion to 110 degrees or better by DC.    Baseline -6 to 95 degrees    Time 8    Period Weeks    Status On-going      PT LONG TERM GOAL #5   Title Savannah [name] will be independent and compliant with her long-term HEP at DC.    Status Achieved                   Plan - 07/29/20 1519     Clinical Impression Statement Savannah [name] is making very good overall progress with her physical therapy.  AROM is -6 to 95 degrees but has been 100 degrees before a recent fall.  I expect AROM to continue to improve as edema subsides.  Her HEP is emphasizing edema control, quadriceps strength and knee AROM.  I recommend she continue her supervised PT up to her follow-up with Dr. Erlinda Hong to maximize AROM, strength and self-reported function before transfer into more independent rehabilitation.    Personal Factors and Comorbidities Comorbidity 3+    Comorbidities Previous R knee scope in 2018, bariatric surgery, CHF    Examination-Activity Limitations Bathing;Dressing;Sit;Transfers;Bed Mobility;Sleep;Bend;Lift;Squat;Locomotion Level;Stairs;Carry;Stand    Examination-Participation Restrictions Interpersonal Relationship;Occupation;Yard Work;Community Activity    Stability/Clinical Decision Making Stable/Uncomplicated    Rehab Potential Good    PT Frequency 3x / week    PT Duration 8 weeks    PT Treatment/Interventions ADLs/Self Care Home Management;Electrical Stimulation;Cryotherapy;Therapeutic activities;Stair training;Gait training;Therapeutic exercise;Balance  training;Neuromuscular re-education;Patient/family education;Manual techniques;Vasopneumatic Device    PT Next Visit Plan Edema control, hip abductors strength (lateral lean with gait), quadriceps strength, AROM    PT Home Exercise Plan Access Code: [email or web address]    Consulted and Agree with Plan of Care Patient;Family member/caregiver             Patient will benefit from skilled therapeutic intervention in order to improve the following deficits and impairments:  Abnormal gait, Decreased activity tolerance, Decreased balance, Decreased endurance, Decreased range of motion, Decreased strength, Difficulty walking, Increased edema, Impaired flexibility, Pain, Obesity  Visit Diagnosis: Difficulty walking  Muscle weakness (generalized)  Localized edema  Stiffness of left knee, not elsewhere classified  Chronic pain of left knee     Problem List Patient Active Problem List   Diagnosis Date Noted   Status post total left knee replacement 05/23/2020   Primary osteoarthritis of left knee 05/05/2020   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   Fatty liver disease, nonalcoholic 83/38/2505   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   Trigger finger, right middle finger 10/10/2018   Other insomnia 06/23/2018   Vitamin D deficiency 03/26/2018   Prediabetes 03/11/2018   Obesity 02/26/2018   Migraines 10/01/2016  Decreased cardiac ejection fraction 10/01/2016   Severe obesity ([place]) 10/01/2016   Shingles 08/03/2016   Chronic pain of right knee 05/24/2016   Unilateral primary osteoarthritis, right knee 05/24/2016   Posterior tibial tendinitis, right leg 25/49/8264   Lichen sclerosus 15/83/0940    Farley Ly PT, MPT 07/29/2020, 3:39 PM  [facility] Physical Therapy [address] [place], [place], [address] Phone: [phone]   Fax:  [phone]  Name: Savannah [name] MRN: [id number] Date of Birth: 08-28-1960

## 2020-07-29 NOTE — Patient Instructions (Signed)
Access Code: [email or web address] URL: [email or web address] Date: 07/29/2020 Prepared by: Vista Mink  Exercises Supine Quadricep Sets - 3-5 x daily - 7 x weekly - 3-5 sets - 10 reps - 5 second hold Small Range Straight Leg Raise - 1-2 x daily - 7 x weekly - 4-10 sets - 5 reps - 3 seconds hold Seated Knee Flexion AAROM - 2-3 x daily - 7 x weekly - 1 sets - 1 reps - 3 minutes hold Standing Hip Hiking - 2 x daily - 7 x weekly - 2 sets - 10 reps - 3 seconds hold

## 2020-08-01 ENCOUNTER — Ambulatory Visit (INDEPENDENT_AMBULATORY_CARE_PROVIDER_SITE_OTHER): Payer: BC Managed Care – PPO | Admitting: Physical Therapy

## 2020-08-01 ENCOUNTER — Other Ambulatory Visit: Payer: Self-pay

## 2020-08-01 DIAGNOSIS — G8929 Other chronic pain: Secondary | ICD-10-CM

## 2020-08-01 DIAGNOSIS — R262 Difficulty in walking, not elsewhere classified: Secondary | ICD-10-CM | POA: Diagnosis not present

## 2020-08-01 DIAGNOSIS — R600 Localized edema: Secondary | ICD-10-CM

## 2020-08-01 DIAGNOSIS — M6281 Muscle weakness (generalized): Secondary | ICD-10-CM

## 2020-08-01 DIAGNOSIS — M25562 Pain in left knee: Secondary | ICD-10-CM

## 2020-08-01 DIAGNOSIS — M25662 Stiffness of left knee, not elsewhere classified: Secondary | ICD-10-CM | POA: Diagnosis not present

## 2020-08-01 NOTE — Therapy (Signed)
[facility] Physical Therapy [address] [place], [place], [address] Phone: [phone]   Fax:  [phone]  Physical Therapy Treatment  Patient Details  Name: Savannah [name] MRN: [id number] Date of Birth: 24-Jul-1960 Referring Provider (PT): Frankey Shown MD   Encounter Date: 08/01/2020   PT End of Session - 08/01/20 1513     Visit Number 19    Number of Visits 24    PT Start Time 8416    PT Stop Time 1510    PT Time Calculation (min) 55 min    Activity Tolerance Patient tolerated treatment well    Behavior During Therapy [facility] for tasks assessed/performed             Past Medical History:  Diagnosis Date   Arthritis    bilateral knees, recent LCL sprain-on left knee   CHF (congestive heart failure) ([place])    Dysrhythmia    trigem   Environmental allergies    Family history of adverse reaction to anesthesia    slow to wake up   Fatty liver    Headache(784.0)    migraines-on meds for control, relpax, ketoprophen, vicoden, muscle relaxer   Hearing loss 2016   High frequency hearing - Bilateral    History of hiatal hernia    Hot flashes    Joint pain    Lichen sclerosus    Lipoma    Right Knee   Menorrhagia    Migraines    Necrobiosis lipoidica    Osteoarthritis    Overweight    Pelvic pain in female    PONV (postoperative nausea and vomiting)    ponv, slow to awake   Seasonal allergies    Sinus problem    Sprain 10/25/2010   left knee   Trigeminal pulse    Trigger finger, right    Middle finger and right wrist    Vitamin D deficiency     Past Surgical History:  Procedure Laterality Date   APPENDECTOMY     CHOLECYSTECTOMY     DIAGNOSTIC LAPAROSCOPY  08/2006   DILATION AND CURETTAGE OF UTERUS  2011   attempted ablation   FUNCTIONAL ENDOSCOPIC SINUS SURGERY     ganglion cyst removal     HYSTEROSCOPY     failed   KNEE ARTHROPLASTY Right 2018   meniscus   LAPAROSCOPIC GASTRIC SLEEVE RESECTION  01/2020   LASIK     LIPOMA EXCISION  Right 07/22/2014   Procedure: EXCISION RIGHT THIGH LIPOMA;  Surgeon: Erroll Luna, MD;  Location: [place];  Service: General;  Laterality: Right;   ROBOTIC ASSISTED TOTAL HYSTERECTOMY  11/06/10   TLH/RSO   SALPINGOOPHORECTOMY  11/06/2010   Procedure: SALPINGO OOPHERECTOMY;  Surgeon: Felipa Emory;  Location: [facility];  Service: Gynecology;  Laterality: Right;   TOTAL KNEE ARTHROPLASTY Left 05/23/2020   Procedure: LEFT TOTAL KNEE ARTHROPLASTY;  Surgeon: Leandrew Koyanagi, MD;  Location: [place];  Service: Orthopedics;  Laterality: Left;   TRIGGER FINGER RELEASE     UPPER GI ENDOSCOPY N/A 02/02/2020   Procedure: UPPER GI ENDOSCOPY;  Surgeon: [name], MD;  Location: [facility];  Service: General;  Laterality: N/A;    There were no vitals filed for this visit.   Subjective Assessment - 08/01/20 1430     Subjective She relays left knee pain is doing good, less than 2/10 pain but still with complaints of stiffness.    Patient is accompained by: Family member    Pertinent History R knee  scope 2018, Bariatric surgery, CHF    Limitations Sitting;House hold activities;Standing;Walking    How long can you sit comfortably? Stiffness with standing after prolonged sitting    How long can you stand comfortably? 15-20 minutes (was 10-15 minutes)    How long can you walk comfortably? 30-45 minutes (was 20-25 minutes)    Patient Stated Goals Walk better without the cane for yard work and return to work    Pain Onset More than a month ago              [facility] Adult PT Treatment/Exercise - 08/01/20 0001       Knee/Hip Exercises: Stretches   Active Hamstring Stretch Left;3 reps;30 seconds    Active Hamstring Stretch Limitations standing with Lt foot DF on treadmill    Knee: Self-Stretch Limitations standing lunge stretch 5 sec x10    Gastroc Stretch Both;3 reps;30 seconds    Other Knee/Hip Stretches supine TKE stretch 5 min      Knee/Hip Exercises: Aerobic   Recumbent Bike 10 min       Knee/Hip Exercises: Machines for Strengthening   Cybex Knee Extension 15# up with 2, down with left 3X10    Cybex Knee Flexion 35#bilat 2X10    Total Gym Leg Press 125# 3 sets of 15, 62# L LE only 2 sets of 15 full extension to slow eccentrics      Knee/Hip Exercises: Standing   Lateral Step Up Left;10 reps;Hand Hold: 1;Step Height: 6"    Forward Step Up Left;Step Height: 6";Hand Hold: 1;10 reps    Step Down 10 reps;Hand Hold: 1;Step Height: 6"    Step Down Limitations stepping down with Rt leg and back up with Lt leg    Functional Squat 15 reps    Functional Squat Limitations with UE support pausing at bottom for 2 sec      Vasopneumatic   Number Minutes Vasopneumatic  10 minutes    Vasopnuematic Location  Knee    Vasopneumatic Pressure Medium    Vasopneumatic Temperature  34      Manual Therapy   Manual therapy comments Lt knee PROM flexion and extension and mobs into extension,                      PT Short Term Goals - 07/29/20 1446       PT SHORT TERM GOAL #1   Title [name] will report independence and compliance with her starter HEP addressing AROM and quadriceps strength impairments.    Status Achieved               PT Long Term Goals - 07/29/20 1456       PT LONG TERM GOAL #1   Title Improve FOTO score to 59.    Baseline 47 at 07/29/20 RA, 35 at evaluation    Time 8    Period Weeks    Status On-going      PT LONG TERM GOAL #2   Title Improve L knee pain to consistently 0-3/10 on the Numeric Pain Rating Scale.    Baseline Can be 5+/10    Time 8    Status Achieved      PT LONG TERM GOAL #3   Title [name] will be able to walk 1/4 mile or more from her car to her office without an assistive device or complaints of functional difficulty.    Baseline Can walk but 1/4 mile without an AD    Time 8  Period Weeks    Status Achieved      PT LONG TERM GOAL #4   Title Improve L knee AROM for extension to -3 degrees or better and flexion to 110  degrees or better by DC.    Baseline -6 to 95 degrees    Time 8    Period Weeks    Status On-going      PT LONG TERM GOAL #5   Title [name] will be independent and compliant with her long-term HEP at DC.    Status Achieved                   Plan - 08/01/20 1513     Clinical Impression Statement She is doing great with strength progression but continues to be limited by Lt knee stiffness and continued with ROM focus today to her tolerance. Continue POC.    Personal Factors and Comorbidities Comorbidity 3+    Comorbidities Previous R knee scope in 2018, bariatric surgery, CHF    Examination-Activity Limitations Bathing;Dressing;Sit;Transfers;Bed Mobility;Sleep;Bend;Lift;Squat;Locomotion Level;Stairs;Carry;Stand    Examination-Participation Restrictions Interpersonal Relationship;Occupation;Yard Work;Community Activity    Stability/Clinical Decision Making Stable/Uncomplicated    Rehab Potential Good    PT Frequency 3x / week    PT Duration 8 weeks    PT Treatment/Interventions ADLs/Self Care Home Management;Electrical Stimulation;Cryotherapy;Therapeutic activities;Stair training;Gait training;Therapeutic exercise;Balance training;Neuromuscular re-education;Patient/family education;Manual techniques;Vasopneumatic Device    PT Next Visit Plan Edema control, hip abductors strength (lateral lean with gait), quadriceps strength, AROM    PT Home Exercise Plan Access Code: [email or web address]    Consulted and Agree with Plan of Care Patient;Family member/caregiver             Patient will benefit from skilled therapeutic intervention in order to improve the following deficits and impairments:  Abnormal gait, Decreased activity tolerance, Decreased balance, Decreased endurance, Decreased range of motion, Decreased strength, Difficulty walking, Increased edema, Impaired flexibility, Pain, Obesity  Visit Diagnosis: Difficulty walking  Muscle weakness (generalized)  Localized  edema  Stiffness of left knee, not elsewhere classified  Chronic pain of left knee     Problem List Patient Active Problem List   Diagnosis Date Noted   Status post total left knee replacement 05/23/2020   Primary osteoarthritis of left knee 05/05/2020   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   Fatty liver disease, nonalcoholic 63/02/6008   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   Trigger finger, right middle finger 10/10/2018   Other insomnia 06/23/2018   Vitamin D deficiency 03/26/2018   Prediabetes 03/11/2018   Obesity 02/26/2018   Migraines 10/01/2016   Decreased cardiac ejection fraction 10/01/2016   Severe obesity ([place]) 10/01/2016   Shingles 08/03/2016   Chronic pain of right knee 05/24/2016   Unilateral primary osteoarthritis, right knee 05/24/2016   Posterior tibial tendinitis, right leg 93/23/5573   Lichen sclerosus 22/03/5425    Silvestre Mesi 08/01/2020, 3:14 PM  [facility] Physical Therapy [address] [place], [place], [address] Phone: [phone]   Fax:  [phone]  Name: BOOTS MCGLOWN MRN: [id number] Date of Birth: 04-16-1960

## 2020-08-03 ENCOUNTER — Other Ambulatory Visit: Payer: Self-pay

## 2020-08-03 ENCOUNTER — Ambulatory Visit (INDEPENDENT_AMBULATORY_CARE_PROVIDER_SITE_OTHER): Payer: BC Managed Care – PPO | Admitting: Physical Therapy

## 2020-08-03 DIAGNOSIS — G8929 Other chronic pain: Secondary | ICD-10-CM

## 2020-08-03 DIAGNOSIS — R600 Localized edema: Secondary | ICD-10-CM | POA: Diagnosis not present

## 2020-08-03 DIAGNOSIS — M6281 Muscle weakness (generalized): Secondary | ICD-10-CM

## 2020-08-03 DIAGNOSIS — R262 Difficulty in walking, not elsewhere classified: Secondary | ICD-10-CM | POA: Diagnosis not present

## 2020-08-03 DIAGNOSIS — M25562 Pain in left knee: Secondary | ICD-10-CM

## 2020-08-03 DIAGNOSIS — M25662 Stiffness of left knee, not elsewhere classified: Secondary | ICD-10-CM

## 2020-08-03 NOTE — Therapy (Signed)
[facility] [address] [place], [place], [address] Phone: [phone]   Fax:  [phone]  Physical Therapy Treatment  Patient Details  Name: Savannah [name] MRN: [id number] Date of Birth: 20-Jun-1960 Referring Provider (PT): Frankey Shown MD   Encounter Date: 08/03/2020   PT End of Session - 08/03/20 1511     Visit Number 20    Number of Visits 24    PT Start Time 1430    PT Stop Time 0947    PT Time Calculation (min) 45 min    Activity Tolerance Patient tolerated treatment well    Behavior During Therapy [facility] for tasks assessed/performed             Past Medical History:  Diagnosis Date   Arthritis    bilateral knees, recent LCL sprain-on left knee   CHF (congestive heart failure) ([place])    Dysrhythmia    trigem   Environmental allergies    Family history of adverse reaction to anesthesia    slow to wake up   Fatty liver    Headache(784.0)    migraines-on meds for control, relpax, ketoprophen, vicoden, muscle relaxer   Hearing loss 2016   High frequency hearing - Bilateral    History of hiatal hernia    Hot flashes    Joint pain    Lichen sclerosus    Lipoma    Right Knee   Menorrhagia    Migraines    Necrobiosis lipoidica    Osteoarthritis    Overweight    Pelvic pain in female    PONV (postoperative nausea and vomiting)    ponv, slow to awake   Seasonal allergies    Sinus problem    Sprain 10/25/2010   left knee   Trigeminal pulse    Trigger finger, right    Middle finger and right wrist    Vitamin D deficiency     Past Surgical History:  Procedure Laterality Date   APPENDECTOMY     CHOLECYSTECTOMY     DIAGNOSTIC LAPAROSCOPY  08/2006   DILATION AND CURETTAGE OF UTERUS  2011   attempted ablation   FUNCTIONAL ENDOSCOPIC SINUS SURGERY     ganglion cyst removal     HYSTEROSCOPY     failed   KNEE ARTHROPLASTY Right 2018   meniscus   LAPAROSCOPIC GASTRIC SLEEVE RESECTION  01/2020   LASIK     LIPOMA EXCISION  Right 07/22/2014   Procedure: EXCISION RIGHT THIGH LIPOMA;  Surgeon: Erroll Luna, MD;  Location: [place];  Service: General;  Laterality: Right;   ROBOTIC ASSISTED TOTAL HYSTERECTOMY  11/06/10   TLH/RSO   SALPINGOOPHORECTOMY  11/06/2010   Procedure: SALPINGO OOPHERECTOMY;  Surgeon: Felipa Emory;  Location: [facility];  Service: Gynecology;  Laterality: Right;   TOTAL KNEE ARTHROPLASTY Left 05/23/2020   Procedure: LEFT TOTAL KNEE ARTHROPLASTY;  Surgeon: Leandrew Koyanagi, MD;  Location: [place];  Service: Orthopedics;  Laterality: Left;   TRIGGER FINGER RELEASE     UPPER GI ENDOSCOPY N/A 02/02/2020   Procedure: UPPER GI ENDOSCOPY;  Surgeon: [name], MD;  Location: [facility];  Service: General;  Laterality: N/A;    There were no vitals filed for this visit.   Subjective Assessment - 08/03/20 1442     Subjective She relays no pain today just stiffness in her Lt knee    Patient is accompained by: Family member    Pertinent History R knee scope 2018, Bariatric surgery, CHF  Limitations Sitting;House hold activities;Standing;Walking    How long can you sit comfortably? Stiffness with standing after prolonged sitting    How long can you stand comfortably? 15-20 minutes (was 10-15 minutes)    How long can you walk comfortably? 30-45 minutes (was 20-25 minutes)    Patient Stated Goals Walk better without the cane for yard work and return to work    Pain Onset More than a month ago                 [facility] Adult PT Treatment/Exercise - 08/03/20 0001       Knee/Hip Exercises: Stretches   Active Hamstring Stretch Left;3 reps;30 seconds    Active Hamstring Stretch Limitations standing with Lt foot DF on treadmill    Knee: Self-Stretch Limitations standing lunge stretch 5 sec x10    Gastroc Stretch Both;3 reps;30 seconds    Other Knee/Hip Stretches prone hang 2# x 4 minutes      Knee/Hip Exercises: Aerobic   Recumbent Bike 10 min      Knee/Hip Exercises: Machines for  Strengthening   Cybex Knee Extension --    Cybex Knee Flexion --    Total Gym Leg Press --      Knee/Hip Exercises: Standing   Lateral Step Up --    Forward Step Up --    Step Down --    Step Down Limitations --    Functional Squat --    Functional Squat Limitations --    Other Standing Knee Exercises TRX squats 2X10 with 2 sec hold, TRX lunges X10 bilat      Vasopneumatic   Number Minutes Vasopneumatic  10 minutes    Vasopnuematic Location  Knee    Vasopneumatic Pressure Medium    Vasopneumatic Temperature  34      Manual Therapy   Manual therapy comments in prone for deep STM to hamstrings and gastroc while in prone hang stretch, then manual quad stretching 30 sec X 3 then moved to supine for Lt knee PROM flexion and extension and mobs into extension                      PT Short Term Goals - 07/29/20 1446       PT SHORT TERM GOAL #1   Title Savannah [name] will report independence and compliance with her starter HEP addressing AROM and quadriceps strength impairments.    Status Achieved               PT Long Term Goals - 07/29/20 1456       PT LONG TERM GOAL #1   Title Improve FOTO score to 59.    Baseline 47 at 07/29/20 RA, 35 at evaluation    Time 8    Period Weeks    Status On-going      PT LONG TERM GOAL #2   Title Improve L knee pain to consistently 0-3/10 on the Numeric Pain Rating Scale.    Baseline Can be 5+/10    Time 8    Status Achieved      PT LONG TERM GOAL #3   Title Savannah [name] will be able to walk 1/4 mile or more from her car to her office without an assistive device or complaints of functional difficulty.    Baseline Can walk but 1/4 mile without an AD    Time 8    Period Weeks    Status Achieved      PT LONG TERM  GOAL #4   Title Improve L knee AROM for extension to -3 degrees or better and flexion to 110 degrees or better by DC.    Baseline -6 to 95 degrees    Time 8    Period Weeks    Status On-going      PT LONG TERM GOAL #5    Title Savannah [name] will be independent and compliant with her long-term HEP at DC.    Status Achieved                   Plan - 08/03/20 1512     Clinical Impression Statement Showed her prone hang and quadriped knee stretch to try at home to further help ROM and she continues to be limited by Lt knee stiffness. Her pain levels and strength are doing good however.    Personal Factors and Comorbidities Comorbidity 3+    Comorbidities Previous R knee scope in 2018, bariatric surgery, CHF    Examination-Activity Limitations Bathing;Dressing;Sit;Transfers;Bed Mobility;Sleep;Bend;Lift;Squat;Locomotion Level;Stairs;Carry;Stand    Examination-Participation Restrictions Interpersonal Relationship;Occupation;Yard Work;Community Activity    Stability/Clinical Decision Making Stable/Uncomplicated    Rehab Potential Good    PT Frequency 3x / week    PT Duration 8 weeks    PT Treatment/Interventions ADLs/Self Care Home Management;Electrical Stimulation;Cryotherapy;Therapeutic activities;Stair training;Gait training;Therapeutic exercise;Balance training;Neuromuscular re-education;Patient/family education;Manual techniques;Vasopneumatic Device    PT Next Visit Plan ROM focus and hip/knee strength    PT Home Exercise Plan Access Code: [email or web address]    Consulted and Agree with Plan of Care Patient;Family member/caregiver             Patient will benefit from skilled therapeutic intervention in order to improve the following deficits and impairments:  Abnormal gait, Decreased activity tolerance, Decreased balance, Decreased endurance, Decreased range of motion, Decreased strength, Difficulty walking, Increased edema, Impaired flexibility, Pain, Obesity  Visit Diagnosis: Difficulty walking  Muscle weakness (generalized)  Localized edema  Stiffness of left knee, not elsewhere classified  Chronic pain of left knee     Problem List Patient Active Problem List   Diagnosis Date Noted   Status post  total left knee replacement 05/23/2020   Primary osteoarthritis of left knee 05/05/2020   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   Fatty liver disease, nonalcoholic 81/44/8185   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   Trigger finger, right middle finger 10/10/2018   Other insomnia 06/23/2018   Vitamin D deficiency 03/26/2018   Prediabetes 03/11/2018   Obesity 02/26/2018   Migraines 10/01/2016   Decreased cardiac ejection fraction 10/01/2016   Severe obesity ([place]) 10/01/2016   Shingles 08/03/2016   Chronic pain of right knee 05/24/2016   Unilateral primary osteoarthritis, right knee 05/24/2016   Posterior tibial tendinitis, right leg 63/14/9702   Lichen sclerosus 63/78/5885    Silvestre Mesi 08/03/2020, 3:14 PM  [facility] Physical Therapy [address] [place], [place], [address] Phone: [phone]   Fax:  [phone]  Name: Savannah [name] MRN: [id number] Date of Birth: 1960-03-12

## 2020-08-05 ENCOUNTER — Encounter: Payer: Self-pay | Admitting: Rehabilitative and Restorative Service Providers"

## 2020-08-05 ENCOUNTER — Ambulatory Visit (INDEPENDENT_AMBULATORY_CARE_PROVIDER_SITE_OTHER): Payer: BC Managed Care – PPO | Admitting: Rehabilitative and Restorative Service Providers"

## 2020-08-05 ENCOUNTER — Other Ambulatory Visit: Payer: Self-pay

## 2020-08-05 DIAGNOSIS — R262 Difficulty in walking, not elsewhere classified: Secondary | ICD-10-CM | POA: Diagnosis not present

## 2020-08-05 DIAGNOSIS — R600 Localized edema: Secondary | ICD-10-CM

## 2020-08-05 DIAGNOSIS — M25562 Pain in left knee: Secondary | ICD-10-CM

## 2020-08-05 DIAGNOSIS — M25662 Stiffness of left knee, not elsewhere classified: Secondary | ICD-10-CM | POA: Diagnosis not present

## 2020-08-05 DIAGNOSIS — M6281 Muscle weakness (generalized): Secondary | ICD-10-CM | POA: Diagnosis not present

## 2020-08-05 DIAGNOSIS — G8929 Other chronic pain: Secondary | ICD-10-CM

## 2020-08-05 NOTE — Patient Instructions (Signed)
Seated SLR, quadriceps sets, prone leg hangeand knee flexion activities.

## 2020-08-05 NOTE — Therapy (Signed)
[facility] Therapy [address] [place], [place], [address] Phone: [phone]   Fax:  [phone]  Physical Therapy Treatment  Patient Details  Name: Savannah [name] MRN: [id number] Date of Birth: [birth year] Referring Provider (PT): Frankey Shown MD   Encounter Date: 08/05/2020   PT End of Session - 08/05/20 1639     Visit Number 21    Number of Visits 24    PT Start Time 1430    PT Stop Time 4193    PT Time Calculation (min) 55 min    Activity Tolerance Patient tolerated treatment well    Behavior During Therapy [facility] for tasks assessed/performed             Past Medical History:  Diagnosis Date   Arthritis    bilateral knees, recent LCL sprain-on left knee   CHF (congestive heart failure) ([place])    Dysrhythmia    trigem   Environmental allergies    Family history of adverse reaction to anesthesia    slow to wake up   Fatty liver    Headache(784.0)    migraines-on meds for control, relpax, ketoprophen, vicoden, muscle relaxer   Hearing loss 2016   High frequency hearing - Bilateral    History of hiatal hernia    Hot flashes    Joint pain    Lichen sclerosus    Lipoma    Right Knee   Menorrhagia    Migraines    Necrobiosis lipoidica    Osteoarthritis    Overweight    Pelvic pain in female    PONV (postoperative nausea and vomiting)    ponv, slow to awake   Seasonal allergies    Sinus problem    Sprain 10/25/2010   left knee   Trigeminal pulse    Trigger finger, right    Middle finger and right wrist    Vitamin D deficiency     Past Surgical History:  Procedure Laterality Date   APPENDECTOMY     CHOLECYSTECTOMY     DIAGNOSTIC LAPAROSCOPY  08/2006   DILATION AND CURETTAGE OF UTERUS  2011   attempted ablation   FUNCTIONAL ENDOSCOPIC SINUS SURGERY     ganglion cyst removal     HYSTEROSCOPY     failed   KNEE ARTHROPLASTY Right 2018   meniscus   LAPAROSCOPIC GASTRIC SLEEVE RESECTION  01/2020   LASIK     LIPOMA EXCISION  Right 07/22/2014   Procedure: EXCISION RIGHT THIGH LIPOMA;  Surgeon: Erroll Luna, MD;  Location: [place];  Service: General;  Laterality: Right;   ROBOTIC ASSISTED TOTAL HYSTERECTOMY  11/06/10   TLH/RSO   SALPINGOOPHORECTOMY  11/06/2010   Procedure: SALPINGO OOPHERECTOMY;  Surgeon: Felipa Emory;  Location: [facility];  Service: Gynecology;  Laterality: Right;   TOTAL KNEE ARTHROPLASTY Left 05/23/2020   Procedure: LEFT TOTAL KNEE ARTHROPLASTY;  Surgeon: Leandrew Koyanagi, MD;  Location: [place];  Service: Orthopedics;  Laterality: Left;   TRIGGER FINGER RELEASE     UPPER GI ENDOSCOPY N/A 02/02/2020   Procedure: UPPER GI ENDOSCOPY;  Surgeon: [name], MD;  Location: [facility];  Service: General;  Laterality: N/A;    There were no vitals filed for this visit.   Subjective Assessment - 08/05/20 1440     Subjective Savannah [name] reports good progress with her HEP and knee rehabilitation this week.  She is a fan of the new prone knee extension stretch Brian added and of the new knee flexion AAROM activity  added today.    Patient is accompained by: Family member    Pertinent History R knee scope 2018, Bariatric surgery, CHF    Limitations Sitting;House hold activities;Standing;Walking    How long can you sit comfortably? Stiffness with standing after prolonged sitting    How long can you stand comfortably? 15-20 minutes (was 10-15 minutes)    How long can you walk comfortably? 30-45 minutes (was 20-25 minutes)    Patient Stated Goals Walk better without the cane for yard work and return to work    Currently in Pain? Yes    Pain Score 4     Pain Location Knee    Pain Orientation Left    Pain Descriptors / Indicators Tightness    Pain Type Chronic pain;Surgical pain    Pain Radiating Towards NA    Pain Onset More than a month ago    Pain Frequency Constant    Aggravating Factors  Too much WB and prolonged postures    Pain Relieving Factors Ice, movement and exercises    Effect of Pain  on Daily Activities Siffness with gait and unable to walk normally due to stiffness and edema    Multiple Pain Sites No                [facility] PT Assessment - 08/05/20 0001       AROM   Left Knee Flexion 100                           [facility] Adult PT Treatment/Exercise - 08/05/20 0001       Therapeutic Activites    Therapeutic Activities ADL's    ADL's Step-up and over 6 and 8 inch step slow eccentrics and land on opposite heel      Neuro Re-ed    Neuro Re-ed Details  Tandem dynamic balance floor and foam forward and backwards      Exercises   Exercises Knee/Hip      Knee/Hip Exercises: Stretches   Other Knee/Hip Stretches Prone hang 2# for 3 minutes      Knee/Hip Exercises: Machines for Strengthening   Total Gym Leg Press 125# 3 sets of 15, 62# L LE only 2 sets of 15 full extension to slow eccentrics      Knee/Hip Exercises: Seated   Other Seated Knee/Hip Exercises Seated knee flexion off treatment table (slide L knee back into flexion then scoot butt forward off table and drop it towards the L heel-forced flexion) 10X 10 seconds      Modalities   Modalities Vasopneumatic      Vasopneumatic   Number Minutes Vasopneumatic  10 minutes    Vasopnuematic Location  Knee    Vasopneumatic Pressure Medium    Vasopneumatic Temperature  34                    PT Education - 08/05/20 1638     Education Details Focused on activities she can do at home and progressions to extension (prone leg/knee hang) and new flexion AAROM activity.    Person(s) Educated Patient    Methods Explanation;Demonstration;Tactile cues;Verbal cues    Comprehension Returned demonstration;Need further instruction;Tactile cues required;Verbalized understanding;Verbal cues required              PT Short Term Goals - 08/05/20 1639       PT SHORT TERM GOAL #1   Title Savannah [name] will report independence and compliance with her starter  HEP addressing AROM and quadriceps  strength impairments.    Status Achieved               PT Long Term Goals - 08/05/20 1639       PT LONG TERM GOAL #1   Title Improve FOTO score to 59.    Baseline 47 at 07/29/20 RA, 35 at evaluation    Time 8    Period Weeks    Status On-going      PT LONG TERM GOAL #2   Title Improve L knee pain to consistently 0-3/10 on the Numeric Pain Rating Scale.    Baseline Can be 5+/10    Time 8    Status Achieved      PT LONG TERM GOAL #3   Title Savannah [name] will be able to walk 1/4 mile or more from her car to her office without an assistive device or complaints of functional difficulty.    Baseline Can walk but 1/4 mile without an AD    Time 8    Period Weeks    Status Achieved      PT LONG TERM GOAL #4   Title Improve L knee AROM for extension to -3 degrees or better and flexion to 110 degrees or better by DC.    Baseline -6 to 100 degrees    Time 8    Period Weeks    Status On-going      PT LONG TERM GOAL #5   Title Savannah [name] will be independent and compliant with her long-term HEP at DC.    Status Achieved                   Plan - 08/05/20 1640     Clinical Impression Statement Savannah [name] likes her new prone leg/knee hang and AAROM flexion activity added the last 2 visits.  Edema is slowly improving after her fall and flexion AROM measured better objectively today.  Extension AROM and strength and knee flexion AROM remain top priorities with Savannah [name]'s rehabilitation.    Personal Factors and Comorbidities Comorbidity 3+    Comorbidities Previous R knee scope in 2018, bariatric surgery, CHF    Examination-Activity Limitations Bathing;Dressing;Sit;Transfers;Bed Mobility;Sleep;Bend;Lift;Squat;Locomotion Level;Stairs;Carry;Stand    Examination-Participation Restrictions Interpersonal Relationship;Occupation;Yard Work;Community Activity    Stability/Clinical Decision Making Stable/Uncomplicated    Rehab Potential Good    PT Frequency 3x / week    PT Duration 8 weeks    PT  Treatment/Interventions ADLs/Self Care Home Management;Electrical Stimulation;Cryotherapy;Therapeutic activities;Stair training;Gait training;Therapeutic exercise;Balance training;Neuromuscular re-education;Patient/family education;Manual techniques;Vasopneumatic Device    PT Next Visit Plan ROM focus and hip/knee strength    PT Home Exercise Plan Access Code: [email or web address]    Consulted and Agree with Plan of Care Patient;Family member/caregiver             Patient will benefit from skilled therapeutic intervention in order to improve the following deficits and impairments:  Abnormal gait, Decreased activity tolerance, Decreased balance, Decreased endurance, Decreased range of motion, Decreased strength, Difficulty walking, Increased edema, Impaired flexibility, Pain, Obesity  Visit Diagnosis: Difficulty walking  Muscle weakness (generalized)  Localized edema  Stiffness of left knee, not elsewhere classified  Chronic pain of left knee     Problem List Patient Active Problem List   Diagnosis Date Noted   Status post total left knee replacement 05/23/2020   Primary osteoarthritis of left knee 05/05/2020   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   Fatty liver disease, nonalcoholic 41/93/7902   Persistent vertigo of central origin  02/10/2019   Trigger finger, right middle finger 10/10/2018   Other insomnia 06/23/2018   Vitamin D deficiency 03/26/2018   Prediabetes 03/11/2018   Obesity 02/26/2018   Migraines 10/01/2016   Decreased cardiac ejection fraction 10/01/2016   Severe obesity ([place]) 10/01/2016   Shingles 08/03/2016   Chronic pain of right knee 05/24/2016   Unilateral primary osteoarthritis, right knee 05/24/2016   Posterior tibial tendinitis, right leg 03/05/2409   Lichen sclerosus 46/43/1427    Farley Ly PT, MPT 08/05/2020, 4:43 PM  [facility] [address] [place], [place], [address] Phone: [phone]   Fax:   [phone]  Name: Savannah [name] MRN: [id number] Date of Birth: 09-01-1960

## 2020-08-09 ENCOUNTER — Other Ambulatory Visit: Payer: Self-pay

## 2020-08-09 ENCOUNTER — Ambulatory Visit (INDEPENDENT_AMBULATORY_CARE_PROVIDER_SITE_OTHER): Payer: BC Managed Care – PPO | Admitting: Physical Therapy

## 2020-08-09 DIAGNOSIS — R262 Difficulty in walking, not elsewhere classified: Secondary | ICD-10-CM

## 2020-08-09 DIAGNOSIS — M25662 Stiffness of left knee, not elsewhere classified: Secondary | ICD-10-CM

## 2020-08-09 DIAGNOSIS — R600 Localized edema: Secondary | ICD-10-CM | POA: Diagnosis not present

## 2020-08-09 DIAGNOSIS — G8929 Other chronic pain: Secondary | ICD-10-CM

## 2020-08-09 DIAGNOSIS — M25562 Pain in left knee: Secondary | ICD-10-CM

## 2020-08-09 DIAGNOSIS — M6281 Muscle weakness (generalized): Secondary | ICD-10-CM

## 2020-08-09 NOTE — Therapy (Signed)
[facility] Physical Therapy [address] [place], [place], [address] Phone: [phone]   Fax:  [phone]  Physical Therapy Treatment  Patient Details  Name: Savannah [name] MRN: [id number] Date of Birth: [birth year] Referring Provider (PT): Frankey Shown MD   Encounter Date: 08/09/2020   PT End of Session - 08/09/20 1549     Visit Number 22    Number of Visits 24    PT Start Time 1430    PT Stop Time 7782    PT Time Calculation (min) 42 min    Activity Tolerance Patient tolerated treatment well    Behavior During Therapy [facility] for tasks assessed/performed             Past Medical History:  Diagnosis Date   Arthritis    bilateral knees, recent LCL sprain-on left knee   CHF (congestive heart failure) ([place])    Dysrhythmia    trigem   Environmental allergies    Family history of adverse reaction to anesthesia    slow to wake up   Fatty liver    Headache(784.0)    migraines-on meds for control, relpax, ketoprophen, vicoden, muscle relaxer   Hearing loss 2016   High frequency hearing - Bilateral    History of hiatal hernia    Hot flashes    Joint pain    Lichen sclerosus    Lipoma    Right Knee   Menorrhagia    Migraines    Necrobiosis lipoidica    Osteoarthritis    Overweight    Pelvic pain in female    PONV (postoperative nausea and vomiting)    ponv, slow to awake   Seasonal allergies    Sinus problem    Sprain 10/25/2010   left knee   Trigeminal pulse    Trigger finger, right    Middle finger and right wrist    Vitamin D deficiency     Past Surgical History:  Procedure Laterality Date   APPENDECTOMY     CHOLECYSTECTOMY     DIAGNOSTIC LAPAROSCOPY  08/2006   DILATION AND CURETTAGE OF UTERUS  2011   attempted ablation   FUNCTIONAL ENDOSCOPIC SINUS SURGERY     ganglion cyst removal     HYSTEROSCOPY     failed   KNEE ARTHROPLASTY Right 2018   meniscus   LAPAROSCOPIC GASTRIC SLEEVE RESECTION  01/2020   LASIK     LIPOMA EXCISION  Right 07/22/2014   Procedure: EXCISION RIGHT THIGH LIPOMA;  Surgeon: Erroll Luna, MD;  Location: [place];  Service: General;  Laterality: Right;   ROBOTIC ASSISTED TOTAL HYSTERECTOMY  11/06/10   TLH/RSO   SALPINGOOPHORECTOMY  11/06/2010   Procedure: SALPINGO OOPHERECTOMY;  Surgeon: Felipa Emory;  Location: [place] [facility];  Service: Gynecology;  Laterality: Right;   TOTAL KNEE ARTHROPLASTY Left 05/23/2020   Procedure: LEFT TOTAL KNEE ARTHROPLASTY;  Surgeon: Leandrew Koyanagi, MD;  Location: [place];  Service: Orthopedics;  Laterality: Left;   TRIGGER FINGER RELEASE     UPPER GI ENDOSCOPY N/A 02/02/2020   Procedure: UPPER GI ENDOSCOPY;  Surgeon: [name], MD;  Location: [facility];  Service: General;  Laterality: N/A;    There were no vitals filed for this visit.   Subjective Assessment - 08/09/20 1505     Subjective Savannah [name] reports not having pain, just stiffness in her Lt knee is her biggest complaint.    Patient is accompained by: Family member    Pertinent History R knee scope 2018, Bariatric  surgery, CHF    Limitations Sitting;House hold activities;Standing;Walking    How long can you sit comfortably? Stiffness with standing after prolonged sitting    How long can you stand comfortably? 15-20 minutes (was 10-15 minutes)    How long can you walk comfortably? 30-45 minutes (was 20-25 minutes)    Patient Stated Goals Walk better without the cane for yard work and return to work    Pain Onset More than a month ago              [facility] Adult PT Treatment/Exercise - 08/09/20 0001       Knee/Hip Exercises: Stretches   Gastroc Stretch Both;3 reps;30 seconds    Other Knee/Hip Stretches Prone hang 3# for 3 minutes    Other Knee/Hip Stretches prone quad stretch 30 sec X 3      Knee/Hip Exercises: Aerobic   Nustep L7 X10 min      Knee/Hip Exercises: Machines for Strengthening   Cybex Knee Extension 15# up with 2, down with left 3X10    Cybex Knee Flexion 35#bilat 2X15    Total  Gym Leg Press 131# 3 sets of 10, 68# L LE only 2 sets of 10 full extension to slow eccentrics      Knee/Hip Exercises: Standing   Other Standing Knee Exercises TRX squats 2X10 with 2 sec hold                      PT Short Term Goals - 08/05/20 1639       PT SHORT TERM GOAL #1   Title Savannah [name] will report independence and compliance with her starter HEP addressing AROM and quadriceps strength impairments.    Status Achieved               PT Long Term Goals - 08/05/20 1639       PT LONG TERM GOAL #1   Title Improve FOTO score to 59.    Baseline 47 at 07/29/20 RA, 35 at evaluation    Time 8    Period Weeks    Status On-going      PT LONG TERM GOAL #2   Title Improve L knee pain to consistently 0-3/10 on the Numeric Pain Rating Scale.    Baseline Can be 5+/10    Time 8    Status Achieved      PT LONG TERM GOAL #3   Title Savannah [name] will be able to walk 1/4 mile or more from her car to her office without an assistive device or complaints of functional difficulty.    Baseline Can walk but 1/4 mile without an AD    Time 8    Period Weeks    Status Achieved      PT LONG TERM GOAL #4   Title Improve L knee AROM for extension to -3 degrees or better and flexion to 110 degrees or better by DC.    Baseline -6 to 100 degrees    Time 8    Period Weeks    Status On-going      PT LONG TERM GOAL #5   Title Savannah [name] will be independent and compliant with her long-term HEP at DC.    Status Achieved                   Plan - 08/09/20 1550     Clinical Impression Statement She is doing overall well but does still lack some ROM leading to knee stiffness.  This should continue to improve with time and consistent stretching. She has 2 more visits left on currentl POC and will likely be ready to discharge to HEP at that time.    Personal Factors and Comorbidities Comorbidity 3+    Comorbidities Previous R knee scope in 2018, bariatric surgery, CHF    Examination-Activity  Limitations Bathing;Dressing;Sit;Transfers;Bed Mobility;Sleep;Bend;Lift;Squat;Locomotion Level;Stairs;Carry;Stand    Examination-Participation Restrictions Interpersonal Relationship;Occupation;Yard Work;Community Activity    Stability/Clinical Decision Making Stable/Uncomplicated    Rehab Potential Good    PT Frequency 3x / week    PT Duration 8 weeks    PT Treatment/Interventions ADLs/Self Care Home Management;Electrical Stimulation;Cryotherapy;Therapeutic activities;Stair training;Gait training;Therapeutic exercise;Balance training;Neuromuscular re-education;Patient/family education;Manual techniques;Vasopneumatic Device    PT Next Visit Plan ROM focus and hip/knee strength    PT Home Exercise Plan Access Code: [email or web address]    Consulted and Agree with Plan of Care Patient;Family member/caregiver             Patient will benefit from skilled therapeutic intervention in order to improve the following deficits and impairments:  Abnormal gait, Decreased activity tolerance, Decreased balance, Decreased endurance, Decreased range of motion, Decreased strength, Difficulty walking, Increased edema, Impaired flexibility, Pain, Obesity  Visit Diagnosis: Difficulty walking  Muscle weakness (generalized)  Localized edema  Stiffness of left knee, not elsewhere classified  Chronic pain of left knee     Problem List Patient Active Problem List   Diagnosis Date Noted   Status post total left knee replacement 05/23/2020   Primary osteoarthritis of left knee 05/05/2020   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   Fatty liver disease, nonalcoholic 46/96/2952   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   Trigger finger, right middle finger 10/10/2018   Other insomnia 06/23/2018   Vitamin D deficiency 03/26/2018   Prediabetes 03/11/2018   Obesity 02/26/2018   Migraines 10/01/2016   Decreased cardiac ejection fraction 10/01/2016   Severe obesity ([place]) 10/01/2016   Shingles  08/03/2016   Chronic pain of right knee 05/24/2016   Unilateral primary osteoarthritis, right knee 05/24/2016   Posterior tibial tendinitis, right leg 84/13/2440   Lichen sclerosus 12/09/2534    Silvestre Mesi 08/09/2020, 3:52 PM  [facility] Physical Therapy [address] [place], [place], [address] Phone: [phone]   Fax:  [phone]  Name: Savannah [name] MRN: [id number] Date of Birth: 01-22-1961

## 2020-08-11 ENCOUNTER — Other Ambulatory Visit: Payer: Self-pay

## 2020-08-11 ENCOUNTER — Ambulatory Visit (INDEPENDENT_AMBULATORY_CARE_PROVIDER_SITE_OTHER): Payer: BC Managed Care – PPO | Admitting: Physical Therapy

## 2020-08-11 DIAGNOSIS — M25562 Pain in left knee: Secondary | ICD-10-CM

## 2020-08-11 DIAGNOSIS — M6281 Muscle weakness (generalized): Secondary | ICD-10-CM

## 2020-08-11 DIAGNOSIS — R600 Localized edema: Secondary | ICD-10-CM

## 2020-08-11 DIAGNOSIS — M25662 Stiffness of left knee, not elsewhere classified: Secondary | ICD-10-CM

## 2020-08-11 DIAGNOSIS — R262 Difficulty in walking, not elsewhere classified: Secondary | ICD-10-CM

## 2020-08-11 DIAGNOSIS — G8929 Other chronic pain: Secondary | ICD-10-CM

## 2020-08-11 NOTE — Therapy (Signed)
[facility] Physical Therapy [address] [place], [place], [address] Phone: [phone]   Fax:  [phone]  Physical Therapy Treatment  Patient Details  Name: Savannah [name] MRN: [id number] Date of Birth: September 02, 1960 Referring Provider (PT): Frankey Shown MD   Encounter Date: 08/11/2020   PT End of Session - 08/11/20 1514     Visit Number 23    Number of Visits 24    PT Start Time 1430    PT Stop Time 3016    PT Time Calculation (min) 44 min    Activity Tolerance Patient tolerated treatment well    Behavior During Therapy [facility] for tasks assessed/performed             Past Medical History:  Diagnosis Date   Arthritis    bilateral knees, recent LCL sprain-on left knee   CHF (congestive heart failure) ([place])    Dysrhythmia    trigem   Environmental allergies    Family history of adverse reaction to anesthesia    slow to wake up   Fatty liver    Headache(784.0)    migraines-on meds for control, relpax, ketoprophen, vicoden, muscle relaxer   Hearing loss 2016   High frequency hearing - Bilateral    History of hiatal hernia    Hot flashes    Joint pain    Lichen sclerosus    Lipoma    Right Knee   Menorrhagia    Migraines    Necrobiosis lipoidica    Osteoarthritis    Overweight    Pelvic pain in female    PONV (postoperative nausea and vomiting)    ponv, slow to awake   Seasonal allergies    Sinus problem    Sprain 10/25/2010   left knee   Trigeminal pulse    Trigger finger, right    Middle finger and right wrist    Vitamin D deficiency     Past Surgical History:  Procedure Laterality Date   APPENDECTOMY     CHOLECYSTECTOMY     DIAGNOSTIC LAPAROSCOPY  08/2006   DILATION AND CURETTAGE OF UTERUS  2011   attempted ablation   FUNCTIONAL ENDOSCOPIC SINUS SURGERY     ganglion cyst removal     HYSTEROSCOPY     failed   KNEE ARTHROPLASTY Right 2018   meniscus   LAPAROSCOPIC GASTRIC SLEEVE RESECTION  01/2020   LASIK     LIPOMA EXCISION  Right 07/22/2014   Procedure: EXCISION RIGHT THIGH LIPOMA;  Surgeon: Erroll Luna, MD;  Location: [place];  Service: General;  Laterality: Right;   ROBOTIC ASSISTED TOTAL HYSTERECTOMY  11/06/10   TLH/RSO   SALPINGOOPHORECTOMY  11/06/2010   Procedure: SALPINGO OOPHERECTOMY;  Surgeon: Felipa Emory;  Location: [place] [facility];  Service: Gynecology;  Laterality: Right;   TOTAL KNEE ARTHROPLASTY Left 05/23/2020   Procedure: LEFT TOTAL KNEE ARTHROPLASTY;  Surgeon: Leandrew Koyanagi, MD;  Location: [place];  Service: Orthopedics;  Laterality: Left;   TRIGGER FINGER RELEASE     UPPER GI ENDOSCOPY N/A 02/02/2020   Procedure: UPPER GI ENDOSCOPY;  Surgeon: [name], MD;  Location: [facility];  Service: General;  Laterality: N/A;    There were no vitals filed for this visit.   Subjective Assessment - 08/11/20 1453     Subjective Savannah [name] reports about 1-2 out of 10 pain in her left knee    Patient is accompained by: Family member    Pertinent History R knee scope 2018, Bariatric surgery, CHF  Limitations Sitting;House hold activities;Standing;Walking    How long can you sit comfortably? Stiffness with standing after prolonged sitting    How long can you stand comfortably? 15-20 minutes (was 10-15 minutes)    How long can you walk comfortably? 30-45 minutes (was 20-25 minutes)    Patient Stated Goals Walk better without the cane for yard work and return to work    Pain Onset More than a month ago              [facility] Adult PT Treatment/Exercise - 08/11/20 0001       Knee/Hip Exercises: Stretches   Active Hamstring Stretch Left;3 reps;30 seconds    Active Hamstring Stretch Limitations standing with Lt foot DF on treadmill    Knee: Self-Stretch Limitations standing lunge stretch 5 sec x10    Gastroc Stretch Both;3 reps;30 seconds    Other Knee/Hip Stretches Prone hang 3# for 4 minutes    Other Knee/Hip Stretches prone quad stretch 60 sec X 3      Knee/Hip Exercises: Aerobic   Recumbent  Bike 10 min      Knee/Hip Exercises: Machines for Strengthening   Cybex Knee Extension 15# up with 2, down with left 3X10    Cybex Knee Flexion 35#bilat 2X15    Total Gym Leg Press 131# 3 sets of 10, 68# L LE only 2 sets of 10 full extension to slow eccentrics      Knee/Hip Exercises: Standing   Step Down 10 reps;Hand Hold: 1;Step Height: 4"   heel taps   Other Standing Knee Exercises deep squats with UE support 2X10 holding 5 sec                      PT Short Term Goals - 08/05/20 1639       PT SHORT TERM GOAL #1   Title Savannah [name] will report independence and compliance with her starter HEP addressing AROM and quadriceps strength impairments.    Status Achieved               PT Long Term Goals - 08/05/20 1639       PT LONG TERM GOAL #1   Title Improve FOTO score to 59.    Baseline 47 at 07/29/20 RA, 35 at evaluation    Time 8    Period Weeks    Status On-going      PT LONG TERM GOAL #2   Title Improve L knee pain to consistently 0-3/10 on the Numeric Pain Rating Scale.    Baseline Can be 5+/10    Time 8    Status Achieved      PT LONG TERM GOAL #3   Title Savannah [name] will be able to walk 1/4 mile or more from her car to her office without an assistive device or complaints of functional difficulty.    Baseline Can walk but 1/4 mile without an AD    Time 8    Period Weeks    Status Achieved      PT LONG TERM GOAL #4   Title Improve L knee AROM for extension to -3 degrees or better and flexion to 110 degrees or better by DC.    Baseline -6 to 100 degrees    Time 8    Period Weeks    Status On-going      PT LONG TERM GOAL #5   Title Savannah [name] will be independent and compliant with her long-term HEP at DC.  Status Achieved                   Plan - 08/11/20 1515     Clinical Impression Statement Continued to focus on ROM as this is her biggest deficit. Strength wise she is doing well. We will update measumrents and goals for DC vs recert next visit.     Personal Factors and Comorbidities Comorbidity 3+    Comorbidities Previous R knee scope in 2018, bariatric surgery, CHF    Examination-Activity Limitations Bathing;Dressing;Sit;Transfers;Bed Mobility;Sleep;Bend;Lift;Squat;Locomotion Level;Stairs;Carry;Stand    Examination-Participation Restrictions Interpersonal Relationship;Occupation;Yard Work;Community Activity    Stability/Clinical Decision Making Stable/Uncomplicated    Rehab Potential Good    PT Frequency 3x / week    PT Duration 8 weeks    PT Treatment/Interventions ADLs/Self Care Home Management;Electrical Stimulation;Cryotherapy;Therapeutic activities;Stair training;Gait training;Therapeutic exercise;Balance training;Neuromuscular re-education;Patient/family education;Manual techniques;Vasopneumatic Device    PT Next Visit Plan ROM focus and hip/knee strength    PT Home Exercise Plan Access Code: [email or web address]    Consulted and Agree with Plan of Care Patient;Family member/caregiver             Patient will benefit from skilled therapeutic intervention in order to improve the following deficits and impairments:  Abnormal gait, Decreased activity tolerance, Decreased balance, Decreased endurance, Decreased range of motion, Decreased strength, Difficulty walking, Increased edema, Impaired flexibility, Pain, Obesity  Visit Diagnosis: Difficulty walking  Muscle weakness (generalized)  Localized edema  Stiffness of left knee, not elsewhere classified  Chronic pain of left knee     Problem List Patient Active Problem List   Diagnosis Date Noted   Status post total left knee replacement 05/23/2020   Primary osteoarthritis of left knee 05/05/2020   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   Fatty liver disease, nonalcoholic 57/02/7791   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   Trigger finger, right middle finger 10/10/2018   Other insomnia 06/23/2018   Vitamin D deficiency 03/26/2018   Prediabetes  03/11/2018   Obesity 02/26/2018   Migraines 10/01/2016   Decreased cardiac ejection fraction 10/01/2016   Severe obesity ([place]) 10/01/2016   Shingles 08/03/2016   Chronic pain of right knee 05/24/2016   Unilateral primary osteoarthritis, right knee 05/24/2016   Posterior tibial tendinitis, right leg 90/30/0923   Lichen sclerosus 30/08/6224    Silvestre Mesi 08/11/2020, 3:18 PM  [facility] Physical Therapy [address] [place], [place], [address] Phone: [phone]   Fax:  [phone]  Name: Savannah [name] MRN: [id number] Date of Birth: 28-Jul-1960

## 2020-08-16 ENCOUNTER — Other Ambulatory Visit: Payer: Self-pay

## 2020-08-16 ENCOUNTER — Ambulatory Visit (INDEPENDENT_AMBULATORY_CARE_PROVIDER_SITE_OTHER): Payer: BC Managed Care – PPO | Admitting: Physical Therapy

## 2020-08-16 DIAGNOSIS — R262 Difficulty in walking, not elsewhere classified: Secondary | ICD-10-CM | POA: Diagnosis not present

## 2020-08-16 DIAGNOSIS — M25562 Pain in left knee: Secondary | ICD-10-CM

## 2020-08-16 DIAGNOSIS — M25662 Stiffness of left knee, not elsewhere classified: Secondary | ICD-10-CM | POA: Diagnosis not present

## 2020-08-16 DIAGNOSIS — M6281 Muscle weakness (generalized): Secondary | ICD-10-CM | POA: Diagnosis not present

## 2020-08-16 DIAGNOSIS — R600 Localized edema: Secondary | ICD-10-CM | POA: Diagnosis not present

## 2020-08-16 DIAGNOSIS — G8929 Other chronic pain: Secondary | ICD-10-CM

## 2020-08-16 NOTE — Therapy (Signed)
[facility] [address] [place], [place], [address] Phone: [phone]   Fax:  [phone]  Physical Therapy Treatment  Patient Details  Name: Savannah [name] MRN: [id number] Date of Birth: [birth year] Referring Provider (PT): Frankey Shown MD   Encounter Date: 08/16/2020   PT End of Session - 08/16/20 1507     Visit Number 24    Number of Visits 24    PT Start Time 0301    PT Stop Time 1520    PT Time Calculation (min) 48 min    Activity Tolerance Patient tolerated treatment well    Behavior During Therapy [facility] for tasks assessed/performed             Past Medical History:  Diagnosis Date   Arthritis    bilateral knees, recent LCL sprain-on left knee   CHF (congestive heart failure) ([place])    Dysrhythmia    trigem   Environmental allergies    Family history of adverse reaction to anesthesia    slow to wake up   Fatty liver    Headache(784.0)    migraines-on meds for control, relpax, ketoprophen, vicoden, muscle relaxer   Hearing loss 2016   High frequency hearing - Bilateral    History of hiatal hernia    Hot flashes    Joint pain    Lichen sclerosus    Lipoma    Right Knee   Menorrhagia    Migraines    Necrobiosis lipoidica    Osteoarthritis    Overweight    Pelvic pain in female    PONV (postoperative nausea and vomiting)    ponv, slow to awake   Seasonal allergies    Sinus problem    Sprain 10/25/2010   left knee   Trigeminal pulse    Trigger finger, right    Middle finger and right wrist    Vitamin D deficiency     Past Surgical History:  Procedure Laterality Date   APPENDECTOMY     CHOLECYSTECTOMY     DIAGNOSTIC LAPAROSCOPY  08/2006   DILATION AND CURETTAGE OF UTERUS  2011   attempted ablation   FUNCTIONAL ENDOSCOPIC SINUS SURGERY     ganglion cyst removal     HYSTEROSCOPY     failed   KNEE ARTHROPLASTY Right 2018   meniscus   LAPAROSCOPIC GASTRIC SLEEVE RESECTION  01/2020   LASIK     LIPOMA EXCISION  Right 07/22/2014   Procedure: EXCISION RIGHT THIGH LIPOMA;  Surgeon: Erroll Luna, MD;  Location: [place];  Service: General;  Laterality: Right;   ROBOTIC ASSISTED TOTAL HYSTERECTOMY  11/06/10   TLH/RSO   SALPINGOOPHORECTOMY  11/06/2010   Procedure: SALPINGO OOPHERECTOMY;  Surgeon: Felipa Emory;  Location: [facility];  Service: Gynecology;  Laterality: Right;   TOTAL KNEE ARTHROPLASTY Left 05/23/2020   Procedure: LEFT TOTAL KNEE ARTHROPLASTY;  Surgeon: Leandrew Koyanagi, MD;  Location: [place];  Service: Orthopedics;  Laterality: Left;   TRIGGER FINGER RELEASE     UPPER GI ENDOSCOPY N/A 02/02/2020   Procedure: UPPER GI ENDOSCOPY;  Surgeon: [name], MD;  Location: [facility];  Service: General;  Laterality: N/A;    There were no vitals filed for this visit.   Subjective Assessment - 08/16/20 1533     Subjective [name] reports no pain, Lt knee stiffness is her only complaint.    Patient is accompained by: Family member    Pertinent History R knee scope 2018, Bariatric surgery, CHF  Limitations Sitting;House hold activities;Standing;Walking    How long can you sit comfortably? Stiffness with standing after prolonged sitting    How long can you stand comfortably? 15-20 minutes (was 10-15 minutes)    How long can you walk comfortably? 30-45 minutes (was 20-25 minutes)    Patient Stated Goals Walk better without the cane for yard work and return to work    Pain Onset More than a month ago                [facility] PT Assessment - 08/16/20 0001       Assessment   Medical Diagnosis s/p L TKA    Referring Provider (PT) Frankey Shown MD    Onset Date/Surgical Date 05/23/20      Observation/Other Assessments   Focus on Therapeutic Outcomes (FOTO)  51%, goal 59%      PROM   Left Knee Extension -3    Left Knee Flexion 97      Strength   Overall Strength Comments Lt knee strength 5/5 MMT              [facility] Adult PT Treatment/Exercise - 08/16/20 0001       Knee/Hip  Exercises: Stretches   Active Hamstring Stretch Left;3 reps;30 seconds    Active Hamstring Stretch Limitations standing with Lt foot DF on treadmill    Knee: Self-Stretch Limitations standing lunge stretch 5 sec x10, supine heelslides AAROM 5 sec X15    Gastroc Stretch Both;3 reps;30 seconds    Other Knee/Hip Stretches Prone hang 3# for 5 minutes    Other Knee/Hip Stretches prone quad stretch 60 sec X 3      Knee/Hip Exercises: Aerobic   Recumbent Bike 10 min L5      Knee/Hip Exercises: Machines for Strengthening   Cybex Knee Extension 15# up with 2, down with left 2X15    Cybex Knee Flexion 35#bilat 2X15    Total Gym Leg Press 131# 3 sets of 10, 68# L LE only 2 sets of 10 full extension to slow eccentrics      Manual Therapy   Manual therapy comments Lt knee PROM                      PT Short Term Goals - 08/16/20 1535       PT SHORT TERM GOAL #1   Title [name] will report independence and compliance with her starter HEP addressing AROM and quadriceps strength impairments.    Status Achieved               PT Long Term Goals - 08/16/20 1535       PT LONG TERM GOAL #1   Title Improve FOTO score to 59.    Baseline improved to 51 on 7/5. 47 at 07/29/20 RA, 35 at evaluation    Time 8    Period Weeks    Status On-going      PT LONG TERM GOAL #2   Title Improve L knee pain to consistently 0-3/10 on the Numeric Pain Rating Scale.    Time 8    Status Achieved      PT LONG TERM GOAL #3   Title [name] will be able to walk 1/4 mile or more from her car to her office without an assistive device or complaints of functional difficulty.    Time 8    Period Weeks    Status Achieved      PT LONG TERM GOAL #  4   Title Improve L knee AROM for extension to -3 degrees or better and flexion to 110 degrees or better by DC.    Baseline -3 to 97 degrees    Time 8    Period Weeks    Status Partially Met      PT LONG TERM GOAL #5   Title [name] will be independent and  compliant with her long-term HEP at DC.    Status Achieved                   Plan - 08/16/20 1535     Clinical Impression Statement She has met 3/5 long term PT goals. She has done well with pain and strength but still limited with Lt knee stiffness. Her extension continues to improve slowly but her flexion has hit plataeu and has not improved over last few weeks. She may be candidate for Manipulation. She will follow up with MD tommorow and we will await further recommendations from MD.    Personal Factors and Comorbidities Comorbidity 3+    Comorbidities Previous R knee scope in 2018, bariatric surgery, CHF    Examination-Activity Limitations Bathing;Dressing;Sit;Transfers;Bed Mobility;Sleep;Bend;Lift;Squat;Locomotion Level;Stairs;Carry;Stand    Examination-Participation Restrictions Interpersonal Relationship;Occupation;Yard Work;Community Activity    Stability/Clinical Decision Making Stable/Uncomplicated    Rehab Potential Good    PT Frequency 3x / week    PT Duration 8 weeks    PT Treatment/Interventions ADLs/Self Care Home Management;Electrical Stimulation;Cryotherapy;Therapeutic activities;Stair training;Gait training;Therapeutic exercise;Balance training;Neuromuscular re-education;Patient/family education;Manual techniques;Vasopneumatic Device    PT Next Visit Plan ROM focus and hip/knee strength    PT Home Exercise Plan Access Code: [email or web address]    Consulted and Agree with Plan of Care Patient;Family member/caregiver             Patient will benefit from skilled therapeutic intervention in order to improve the following deficits and impairments:  Abnormal gait, Decreased activity tolerance, Decreased balance, Decreased endurance, Decreased range of motion, Decreased strength, Difficulty walking, Increased edema, Impaired flexibility, Pain, Obesity  Visit Diagnosis: Difficulty walking  Muscle weakness (generalized)  Localized edema  Stiffness of left knee, not  elsewhere classified  Chronic pain of left knee     Problem List Patient Active Problem List   Diagnosis Date Noted   Status post total left knee replacement 05/23/2020   Primary osteoarthritis of left knee 05/05/2020   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   Fatty liver disease, nonalcoholic 59/47/0761   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   Trigger finger, right middle finger 10/10/2018   Other insomnia 06/23/2018   Vitamin D deficiency 03/26/2018   Prediabetes 03/11/2018   Obesity 02/26/2018   Migraines 10/01/2016   Decreased cardiac ejection fraction 10/01/2016   Severe obesity ([place]) 10/01/2016   Shingles 08/03/2016   Chronic pain of right knee 05/24/2016   Unilateral primary osteoarthritis, right knee 05/24/2016   Posterior tibial tendinitis, right leg 51/83/4373   Lichen sclerosus 57/89/7847    Silvestre Mesi 08/16/2020, 3:41 PM  [facility] Physical Therapy [address] [place], [place], [address] Phone: [phone]   Fax:  [phone]  Name: BEAU VANDUZER MRN: [id number] Date of Birth: 17-Dec-1960

## 2020-08-17 ENCOUNTER — Encounter: Payer: Self-pay | Admitting: Orthopaedic Surgery

## 2020-08-17 ENCOUNTER — Ambulatory Visit (INDEPENDENT_AMBULATORY_CARE_PROVIDER_SITE_OTHER): Payer: BC Managed Care – PPO | Admitting: Orthopaedic Surgery

## 2020-08-17 ENCOUNTER — Encounter (HOSPITAL_BASED_OUTPATIENT_CLINIC_OR_DEPARTMENT_OTHER): Payer: Self-pay | Admitting: Orthopaedic Surgery

## 2020-08-17 ENCOUNTER — Other Ambulatory Visit: Payer: Self-pay

## 2020-08-17 DIAGNOSIS — Z96652 Presence of left artificial knee joint: Secondary | ICD-10-CM

## 2020-08-17 NOTE — Progress Notes (Signed)
Post-Op Visit Note   Patient: Savannah [name]           Date of Birth: 04-05-1960           MRN: [id number] Visit Date: 08/17/2020 PCP: Lawerance Cruel, MD   Assessment & Plan:  Chief Complaint:  Chief Complaint  Patient presents with   Right Knee - Pain   Visit Diagnoses:  1. Status post total left knee replacement     Plan: Savannah [name] is a 60 y.o. female following up 3 months status post left total knee arthroplasty, date of surgery 05/23/20. Overall she is not having much pain in the knee but is concerned about stiffness. She has been working diligently with outpatient physical therapy and her last appointment was yesterday. Initially she was able to flex to 105 degrees but now she can only get to about 95 degrees. Her knee feels tight over the lateral aspect and she endorses soft tissue swelling. She is unable to do certain daily activities including cleaning and gardening due to the stiffness in her knee. 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  Left knee exam demonstrates a well healed surgical incision without erythema or signs of infection. She does have some soft tissue swelling around the knee, no effusion. She lacks a 10 degrees of extension and flexes to 90 degrees with pain and firm endpoint. Stable varus and valgus stress. Distal neurovascular exam intact   At this point I believe she would benefit from a manipulation of her left knee under anesthesia that can be done either today or tomorrow. We discussed the procedure in detail including the risks, benefits and expected outcome. Risks include a periprosthetic fracture however this is a very low risk. Patient demonstrated a good understanding and would like to proceed with the manipulation. We'll have Debbie come talk to her today. For now we'll keep her on the same work restrictions of 4 hours a day.  Follow-Up Instructions: Follow up for surgery.  Orders:  No orders of the defined types were  placed in this encounter.  No orders of the defined types were placed in this encounter.   Imaging: No new imaging  PMFS History: Patient Active Problem List   Diagnosis Date Noted   Status post total left knee replacement 05/23/2020   Primary osteoarthritis of left knee 05/05/2020   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   Fatty liver disease, nonalcoholic 81/27/5170   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   Trigger finger, right middle finger 10/10/2018   Other insomnia 06/23/2018   Vitamin D deficiency 03/26/2018   Prediabetes 03/11/2018   Obesity 02/26/2018   Migraines 10/01/2016   Decreased cardiac ejection fraction 10/01/2016   Severe obesity ([place]) 10/01/2016   Shingles 08/03/2016   Chronic pain of right knee 05/24/2016   Unilateral primary osteoarthritis, right knee 05/24/2016   Posterior tibial tendinitis, right leg 01/74/9449   Lichen sclerosus 67/59/1638   Past Medical History:  Diagnosis Date   Arthritis    bilateral knees, recent LCL sprain-on left knee   CHF (congestive heart failure) ([place])    Dysrhythmia    trigem   Environmental allergies    Family history of adverse reaction to anesthesia    slow to wake up   Fatty liver    Headache(784.0)    migraines-on meds for control, relpax, ketoprophen, vicoden, muscle relaxer   Hearing loss 2016   High frequency hearing - Bilateral  History of hiatal hernia    Hot flashes    Joint pain    Lichen sclerosus    Lipoma    Right Knee   Menorrhagia    Migraines    Necrobiosis lipoidica    Osteoarthritis    Overweight    Pelvic pain in female    PONV (postoperative nausea and vomiting)    ponv, slow to awake   Seasonal allergies    Sinus problem    Sprain 10/25/2010   left knee   Trigeminal pulse    Trigger finger, right    Middle finger and right wrist    Vitamin D deficiency     Family History  Problem Relation Age of Onset   Skin cancer Mother    Migraines Mother    High  blood pressure Mother    Heart disease Father    High blood pressure Father    High Cholesterol Father    Breast cancer Maternal Aunt 43   Breast cancer Paternal Aunt 54   Migraines Brother     Past Surgical History:  Procedure Laterality Date   APPENDECTOMY     CHOLECYSTECTOMY     DIAGNOSTIC LAPAROSCOPY  08/2006   DILATION AND CURETTAGE OF UTERUS  2011   attempted ablation   FUNCTIONAL ENDOSCOPIC SINUS SURGERY     ganglion cyst removal     HYSTEROSCOPY     failed   KNEE ARTHROPLASTY Right 2018   meniscus   LAPAROSCOPIC GASTRIC SLEEVE RESECTION  01/2020   LASIK     LIPOMA EXCISION Right 07/22/2014   Procedure: EXCISION RIGHT THIGH LIPOMA;  Surgeon: Erroll Luna, MD;  Location: [place];  Service: General;  Laterality: Right;   ROBOTIC ASSISTED TOTAL HYSTERECTOMY  11/06/10   TLH/RSO   SALPINGOOPHORECTOMY  11/06/2010   Procedure: SALPINGO OOPHERECTOMY;  Surgeon: Felipa Emory;  Location: [facility];  Service: Gynecology;  Laterality: Right;   TOTAL KNEE ARTHROPLASTY Left 05/23/2020   Procedure: LEFT TOTAL KNEE ARTHROPLASTY;  Surgeon: Leandrew Koyanagi, MD;  Location: [place];  Service: Orthopedics;  Laterality: Left;   TRIGGER FINGER RELEASE     UPPER GI ENDOSCOPY N/A 02/02/2020   Procedure: UPPER GI ENDOSCOPY;  Surgeon: [name], MD;  Location: [facility];  Service: General;  Laterality: N/A;   Social History   Occupational History   Occupation: Administrator, [employer]  Tobacco Use   Smoking status: Never   Smokeless tobacco: Never  Vaping Use   Vaping Use: Never used  Substance and Sexual Activity   Alcohol use: No    Alcohol/week: 0.0 standard drinks   Drug use: No   Sexual activity: Yes    Birth control/protection: Surgical    Comment: TLH/RSO

## 2020-08-17 NOTE — Anesthesia Preprocedure Evaluation (Addendum)
Anesthesia Evaluation  Patient identified by MRN, date of birth, ID band Patient awake    Reviewed: Allergy & Precautions, NPO status , Patient's Chart, lab work & pertinent test results  History of Anesthesia Complications (+) PONV  Airway Mallampati: II  TM Distance: >3 FB Neck ROM: Full    Dental no notable dental hx. (+) Teeth Intact, Dental Advisory Given   Pulmonary    Pulmonary exam normal breath sounds clear to auscultation       Cardiovascular Exercise Tolerance: Good +CHF  Normal cardiovascular exam Rhythm:Regular Rate:Normal  09/2019 Echo 1. Left ventricular ejection fraction, by estimation, is 50%. The left  ventricle has mildly reduced systolic function. Mild global hypokinesis.  Left ventricular diastolic parameters are consistent with Grade I  diastolic dysfunction (impaired  relaxation). GLS -23.2%, normal.  2. Right ventricular systolic function is normal. The right ventricular  size is normal. Tricuspid regurgitation signal is inadequate for assessing  PA pressure.  3. The aortic valve is tricuspid. Aortic valve regurgitation is not  visualized. No aortic stenosis is present.  4. The mitral valve is normal in structure. No evidence of mitral valve  regurgitation. No evidence of mitral stenosis.  5. The inferior vena cava is normal in size with greater than 50%  respiratory variability, suggesting right atrial pressure of 3 mmHg.    Neuro/Psych  Headaches,    GI/Hepatic Neg liver ROS, hiatal hernia,   Endo/Other    Renal/GU negative Renal ROS     Musculoskeletal  (+) Arthritis ,   Abdominal (+) + obese,   Peds  Hematology   Anesthesia Other Findings   Reproductive/Obstetrics                            Anesthesia Physical Anesthesia Plan  ASA: 2  Anesthesia Plan: MAC   Post-op Pain Management:  Regional for Post-op pain   Induction:   PONV Risk Score and  Plan: Treatment may vary due to age or medical condition, Midazolam and Ondansetron  Airway Management Planned: Natural Airway  Additional Equipment: None  Intra-op Plan:   Post-operative Plan:   Informed Consent: I have reviewed the patients History and Physical, chart, labs and discussed the procedure including the risks, benefits and alternatives for the proposed anesthesia with the patient or authorized representative who has indicated his/her understanding and acceptance.     Dental advisory given  Plan Discussed with:   Anesthesia Plan Comments: (MAC w adductor canal block)      Anesthesia Quick Evaluation

## 2020-08-18 ENCOUNTER — Other Ambulatory Visit: Payer: Self-pay

## 2020-08-18 ENCOUNTER — Ambulatory Visit (HOSPITAL_BASED_OUTPATIENT_CLINIC_OR_DEPARTMENT_OTHER): Payer: BC Managed Care – PPO | Admitting: Anesthesiology

## 2020-08-18 ENCOUNTER — Encounter (HOSPITAL_BASED_OUTPATIENT_CLINIC_OR_DEPARTMENT_OTHER)
Admission: RE | Disposition: A | Discharge status: Home / Self Care | Payer: Self-pay | Source: Home / Self Care | Attending: Orthopaedic Surgery

## 2020-08-18 ENCOUNTER — Ambulatory Visit (HOSPITAL_BASED_OUTPATIENT_CLINIC_OR_DEPARTMENT_OTHER)
Admission: RE | Admit: 2020-08-18 | Discharge: 2020-08-18 | Disposition: A | Discharge status: Home / Self Care | Payer: BC Managed Care – PPO | Attending: Orthopaedic Surgery | Admitting: Orthopaedic Surgery

## 2020-08-18 ENCOUNTER — Ambulatory Visit (HOSPITAL_COMMUNITY): Payer: BC Managed Care – PPO

## 2020-08-18 ENCOUNTER — Encounter (HOSPITAL_BASED_OUTPATIENT_CLINIC_OR_DEPARTMENT_OTHER): Payer: Self-pay | Admitting: Orthopaedic Surgery

## 2020-08-18 DIAGNOSIS — R52 Pain, unspecified: Secondary | ICD-10-CM

## 2020-08-18 DIAGNOSIS — M25662 Stiffness of left knee, not elsewhere classified: Secondary | ICD-10-CM

## 2020-08-18 DIAGNOSIS — Z96652 Presence of left artificial knee joint: Secondary | ICD-10-CM | POA: Diagnosis not present

## 2020-08-18 DIAGNOSIS — Z79899 Other long term (current) drug therapy: Secondary | ICD-10-CM | POA: Insufficient documentation

## 2020-08-18 DIAGNOSIS — Z886 Allergy status to analgesic agent status: Secondary | ICD-10-CM | POA: Insufficient documentation

## 2020-08-18 DIAGNOSIS — M24662 Ankylosis, left knee: Secondary | ICD-10-CM | POA: Diagnosis not present

## 2020-08-18 HISTORY — PX: KNEE CLOSED REDUCTION: SHX995

## 2020-08-18 SURGERY — MANIPULATION, KNEE, CLOSED
Anesthesia: Monitor Anesthesia Care | Site: Knee | Laterality: Left

## 2020-08-18 MED ORDER — AMISULPRIDE (ANTIEMETIC) 5 MG/2ML IV SOLN: 10.0000 mg | Freq: Once | INTRAVENOUS | Status: DC | PRN

## 2020-08-18 MED ORDER — TRIAMCINOLONE ACETONIDE 40 MG/ML IJ SUSP
INTRAMUSCULAR | Status: AC
  Filled 2020-08-18: qty 5

## 2020-08-18 MED ORDER — PROPOFOL 500 MG/50ML IV EMUL
INTRAVENOUS | Status: DC | PRN
  Administered 2020-08-18: 150 mg via INTRAVENOUS

## 2020-08-18 MED ORDER — FENTANYL CITRATE (PF) 100 MCG/2ML IJ SOLN
INTRAMUSCULAR | Status: AC
  Filled 2020-08-18: qty 2

## 2020-08-18 MED ORDER — ONDANSETRON HCL 4 MG/2ML IJ SOLN: 4.0000 mg | Freq: Once | INTRAMUSCULAR | Status: DC | PRN

## 2020-08-18 MED ORDER — BUPIVACAINE HCL (PF) 0.5 % IJ SOLN
INTRAMUSCULAR | Status: AC
  Filled 2020-08-18: qty 30

## 2020-08-18 MED ORDER — ACETAMINOPHEN 10 MG/ML IV SOLN: 1000.0000 mg | Freq: Once | INTRAVENOUS | Status: DC | PRN

## 2020-08-18 MED ORDER — HYDROMORPHONE HCL 1 MG/ML IJ SOLN
INTRAMUSCULAR | Status: AC
  Filled 2020-08-18: qty 0.5

## 2020-08-18 MED ORDER — FENTANYL CITRATE (PF) 100 MCG/2ML IJ SOLN
100.0000 ug | Freq: Once | INTRAMUSCULAR | Status: AC
Start: 2020-08-18 — End: 2020-08-18
  Administered 2020-08-18: 50 ug via INTRAVENOUS

## 2020-08-18 MED ORDER — BUPIVACAINE HCL (PF) 0.25 % IJ SOLN
INTRAMUSCULAR | Status: DC | PRN
  Administered 2020-08-18: 10 mL via INTRA_ARTICULAR

## 2020-08-18 MED ORDER — OXYCODONE HCL 5 MG PO TABS
5.0000 mg | ORAL_TABLET | Freq: Once | ORAL | Status: AC | PRN
Start: 2020-08-18 — End: 2020-08-18
  Administered 2020-08-18: 5 mg via ORAL

## 2020-08-18 MED ORDER — BUPIVACAINE HCL (PF) 0.25 % IJ SOLN
INTRAMUSCULAR | Status: AC
  Filled 2020-08-18: qty 30

## 2020-08-18 MED ORDER — ROPIVACAINE HCL 7.5 MG/ML IJ SOLN
INTRAMUSCULAR | Status: DC | PRN
  Administered 2020-08-18: 20 mL via PERINEURAL

## 2020-08-18 MED ORDER — HYDROMORPHONE HCL 1 MG/ML IJ SOLN
0.2500 mg | INTRAMUSCULAR | Status: DC | PRN
  Administered 2020-08-18: 0.5 mg via INTRAVENOUS

## 2020-08-18 MED ORDER — OXYCODONE HCL 5 MG/5ML PO SOLN: 5.0000 mg | Freq: Once | ORAL | Status: AC | PRN

## 2020-08-18 MED ORDER — LACTATED RINGERS IV SOLN: INTRAVENOUS | Status: DC

## 2020-08-18 MED ORDER — ONDANSETRON HCL 4 MG/2ML IJ SOLN
INTRAMUSCULAR | Status: DC | PRN
  Administered 2020-08-18: 4 mg via INTRAVENOUS

## 2020-08-18 MED ORDER — MIDAZOLAM HCL 2 MG/2ML IJ SOLN
INTRAMUSCULAR | Status: AC
  Filled 2020-08-18: qty 2

## 2020-08-18 MED ORDER — MIDAZOLAM HCL 2 MG/2ML IJ SOLN
2.0000 mg | Freq: Once | INTRAMUSCULAR | Status: AC
  Administered 2020-08-18: 2 mg via INTRAVENOUS

## 2020-08-18 MED ORDER — OXYCODONE HCL 5 MG PO TABS
ORAL_TABLET | ORAL | Status: AC
  Filled 2020-08-18: qty 1

## 2020-08-18 MED ORDER — METHYLPREDNISOLONE ACETATE 80 MG/ML IJ SUSP
INTRAMUSCULAR | Status: AC
  Filled 2020-08-18: qty 1

## 2020-08-18 MED ORDER — METHYLPREDNISOLONE ACETATE 40 MG/ML IJ SUSP
INTRAMUSCULAR | Status: AC
  Filled 2020-08-18: qty 1

## 2020-08-18 SURGICAL SUPPLY — 13 items
GLOVE SURG NEOP MICRO LF SZ7.5 (GLOVE) IMPLANT
GLOVE SURG UNDER POLY LF SZ7 (GLOVE) ×1 IMPLANT
GOWN STRL REIN XL XLG (GOWN DISPOSABLE) ×1 IMPLANT
GOWN STRL REUS W/ TWL XL LVL3 (GOWN DISPOSABLE) ×1 IMPLANT
GOWN STRL REUS W/TWL XL LVL3 (GOWN DISPOSABLE)
NDL SAFETY ECLIPSE 18X1.5 (NEEDLE) ×1 IMPLANT
NEEDLE HYPO 18GX1.5 SHARP (NEEDLE)
NEEDLE HYPO 22GX1.5 SAFETY (NEEDLE) ×2 IMPLANT
PAD ALCOHOL SWAB (MISCELLANEOUS) ×10 IMPLANT
SHEET MEDIUM DRAPE 40X70 STRL (DRAPES) ×1 IMPLANT
SYR 20ML LL LF (SYRINGE) ×1 IMPLANT
SYR CONTROL 10ML LL (SYRINGE) ×2 IMPLANT
TOWEL GREEN STERILE FF (TOWEL DISPOSABLE) ×2 IMPLANT

## 2020-08-18 NOTE — H&P (Signed)
PREOPERATIVE H&P  Chief Complaint: left knee stiffness  HPI: Savannah [name] is a 60 y.o. female who presents for surgical treatment of left knee stiffness.  She denies any changes in medical history.  Past Medical History:  Diagnosis Date   Arthritis    bilateral knees, recent LCL sprain-on left knee   CHF (congestive heart failure) (HCC)    Dysrhythmia    trigem   Environmental allergies    Family history of adverse reaction to anesthesia    slow to wake up   Fatty liver    Headache(784.0)    migraines-on meds for control, relpax, ketoprophen, vicoden, muscle relaxer   Hearing loss 2016   High frequency hearing - Bilateral    History of hiatal hernia    Hot flashes    Joint pain    Lichen sclerosus    Lipoma    Right Knee   Menorrhagia    Migraines    Necrobiosis lipoidica    Osteoarthritis    Overweight    Pelvic pain in female    PONV (postoperative nausea and vomiting)    ponv, slow to awake   Seasonal allergies    Sinus problem    Sprain 10/25/2010   left knee   Trigeminal pulse    Trigger finger, right    Middle finger and right wrist    Vitamin D deficiency    Past Surgical History:  Procedure Laterality Date   APPENDECTOMY     CHOLECYSTECTOMY     DIAGNOSTIC LAPAROSCOPY  08/2006   DILATION AND CURETTAGE OF UTERUS  2011   attempted ablation   FUNCTIONAL ENDOSCOPIC SINUS SURGERY     ganglion cyst removal     HYSTEROSCOPY     failed   KNEE ARTHROPLASTY Right 2018   meniscus   LAPAROSCOPIC GASTRIC SLEEVE RESECTION  01/2020   LASIK     LIPOMA EXCISION Right 07/22/2014   Procedure: EXCISION RIGHT THIGH LIPOMA;  Surgeon: Erroll Luna, MD;  Location: [facility];  Service: General;  Laterality: Right;   ROBOTIC ASSISTED TOTAL HYSTERECTOMY  11/06/10   TLH/RSO   SALPINGOOPHORECTOMY  11/06/2010   Procedure: SALPINGO OOPHERECTOMY;  Surgeon: Felipa Emory;  Location: [facility];  Service: Gynecology;  Laterality: Right;   TOTAL KNEE  ARTHROPLASTY Left 05/23/2020   Procedure: LEFT TOTAL KNEE ARTHROPLASTY;  Surgeon: Leandrew Koyanagi, MD;  Location: [facility];  Service: Orthopedics;  Laterality: Left;   TRIGGER FINGER RELEASE     UPPER GI ENDOSCOPY N/A 02/02/2020   Procedure: UPPER GI ENDOSCOPY;  Surgeon: [name], MD;  Location: [facility];  Service: General;  Laterality: N/A;   Social History   Socioeconomic History   Marital status: Married    Spouse name: Eddie Dibbles [place]   Number of children: 0   Years of education: 18   Highest education level: Not on file  Occupational History   Occupation: Administrator, [employer]  Tobacco Use   Smoking status: Never   Smokeless tobacco: Never  Vaping Use   Vaping Use: Never used  Substance and Sexual Activity   Alcohol use: No    Alcohol/week: 0.0 standard drinks   Drug use: No   Sexual activity: Yes    Birth control/protection: Surgical    Comment: TLH/RSO  Other Topics Concern   Not on file  Social History Narrative   Lives w/ husband, Eddie Dibbles   Caffeine use: Diet coke girl   Right handed    Social Determinants of  Health   Financial Resource Strain: Not on file  Food Insecurity: No Food Insecurity   Worried About Charity fundraiser in the Last Year: Never true   Ran Out of Food in the Last Year: Never true  Transportation Needs: Not on file  Physical Activity: Not on file  Stress: Not on file  Social Connections: Not on file   Family History  Problem Relation Age of Onset   Skin cancer Mother    Migraines Mother    High blood pressure Mother    Heart disease Father    High blood pressure Father    High Cholesterol Father    Breast cancer Maternal Aunt 42   Breast cancer Paternal Aunt 20   Migraines Brother    Allergies  Allergen Reactions   Dust Mite Extract Other (See Comments)    Environmental allergies   Nsaids Other (See Comments)    avoided due to bariatric surgery    Prior to Admission medications   Medication Sig Start Date End Date Taking? Authorizing  Provider  amoxicillin (AMOXIL) 500 MG tablet Take four pills one hour prior to dental work 07/18/20   Aundra Dubin, PA-C  Ca Phosphate-Cholecalciferol (CALCIUM 500 + D3) 250-500 MG-UNIT CHEW Chew 1 tablet by mouth in the morning, at noon, and at bedtime. Bariatric Calcium Citrate chewy   Yes [provider]  carvedilol (COREG) 6.25 MG tablet Take 1 tablet (6.25 mg total) by mouth 2 (two) times daily with a meal. 09/10/19  Yes Josue Hector, MD  clobetasol cream (TEMOVATE) 6.16 % Apply 1 application topically as needed (lichen sclerosus). Patient taking differently: Apply 1 application topically in the morning and at bedtime. 05/04/20  Yes Megan Salon, MD  eletriptan (RELPAX) 40 MG tablet TAKE 1 TABLET TWICE A DAY  AS NEEDED FOR MIGRAINE OR  HEADACHE 06/30/20  Yes Suzzanne Cloud, NP  furosemide (LASIX) 20 MG tablet Take 0.5 tablets (10 mg total) by mouth daily as needed for edema. 09/10/19  Yes Josue Hector, MD  lisinopril (ZESTRIL) 10 MG tablet TAKE 1 TABLET DAILY 07/25/20  Yes Josue Hector, MD  methocarbamol (ROBAXIN) 500 MG tablet Take 1 tablet (500 mg total) by mouth 2 (two) times daily as needed. 06/30/20  Yes Aundra Dubin, PA-C  Multiple Vitamins-Minerals (BARIATRIC MULTIVITAMINS/IRON PO) Take 1 tablet by mouth in the morning. BariMelts Multivitamin W/Iron (chewable)   Yes [provider]  multivitamin-lutein (OCUVITE-LUTEIN) CAPS capsule Take 1 capsule by mouth in the morning.   Yes [provider]  oxyCODONE-acetaminophen (PERCOCET) 5-325 MG tablet Take 1-2 tablets by mouth every 8 (eight) hours as needed. 06/10/20  Yes Leandrew Koyanagi, MD  hyoscyamine (LEVSIN) 0.125 MG tablet Take 0.125 mg by mouth every 4 (four) hours as needed (abdominal cramps/spasms).    [provider]  meclizine (ANTIVERT) 25 MG tablet Take 25 mg by mouth 4 (four) times daily as needed for dizziness.    [provider]  methocarbamol (ROBAXIN) 500 MG tablet Take 1  tablet (500 mg total) by mouth 2 (two) times daily as needed. To be taken post-op 07/06/20   Leandrew Koyanagi, MD  mometasone (ELOCON) 0.1 % ointment Use small topically twice weekly for maintenance. Patient taking differently: Apply 1 application topically See admin instructions. Use small topically twice weekly as needed for irritation 05/04/20   Megan Salon, MD  enoxaparin (LOVENOX) 40 MG/0.4ML injection Inject 0.4 mLs (40 mg total) into the skin every  12 (twelve) hours for 28 days. 02/03/20 05/04/20  [name], MD  gabapentin (NEURONTIN) 100 MG capsule Take 2 capsules (200 mg total) by mouth every 12 (twelve) hours. 02/03/20 03/28/20  [name], MD  pantoprazole (PROTONIX) 40 MG tablet Take 1 tablet (40 mg total) by mouth daily. 02/03/20 05/04/20  [name], MD     Positive ROS: All other systems have been reviewed and were otherwise negative with the exception of those mentioned in the HPI and as above.  Physical Exam: General: Alert, no acute distress Cardiovascular: No pedal edema Respiratory: No cyanosis, no use of accessory musculature GI: abdomen soft Skin: No lesions in the area of chief complaint Neurologic: Sensation intact distally Psychiatric: Patient is competent for consent with normal mood and affect Lymphatic: no lymphedema  MUSCULOSKELETAL: exam stable  Assessment: left knee stiffness  Plan: Plan for Procedure(s): LEFT KNEE MANIPULATION UNDER ANESTHESIA  The risks benefits and alternatives were discussed with the patient including but not limited to the risks of nonoperative treatment, versus surgical intervention including infection, bleeding, nerve injury,  blood clots, cardiopulmonary complications, morbidity, mortality, among others, and they were willing to proceed.   Preoperative templating of the joint replacement has been completed, documented, and submitted to the Operating Room personnel in order to optimize intra-operative equipment  management.   Eduard Roux, MD 08/18/2020 2:09 PM

## 2020-08-18 NOTE — Anesthesia Procedure Notes (Signed)
Anesthesia Regional Block: Adductor canal block   Pre-Anesthetic Checklist: , timeout performed,  Correct Patient, Correct Site, Correct Laterality,  Correct Procedure, Correct Position, site marked,  Risks and benefits discussed,  Surgical consent,  Pre-op evaluation,  At surgeon's request and post-op pain management  Laterality: Lower and Left  Prep: chloraprep       Needles:  Injection technique: Single-shot  Needle Type: Echogenic Needle     Needle Length: 9cm  Needle Gauge: 22     Additional Needles:   Procedures:,,,, ultrasound used (permanent image in chart),,    Narrative:  Start time: 08/18/2020 1:24 PM End time: 08/18/2020 1:30 PM Injection made incrementally with aspirations every 5 mL.  Performed by: Personally  Anesthesiologist: Barnet Glasgow, MD  Additional Notes: Block assessed prior to surgery. Pt tolerated procedure well.

## 2020-08-18 NOTE — Anesthesia Postprocedure Evaluation (Signed)
Anesthesia Post Note  Patient: Savannah [name]  Procedure(s) Performed: LEFT KNEE MANIPULATION UNDER ANESTHESIA (Left: Knee)     Patient location during evaluation: PACU Anesthesia Type: MAC Level of consciousness: awake and alert Pain management: pain level controlled Vital Signs Assessment: post-procedure vital signs reviewed and stable Respiratory status: spontaneous breathing, nonlabored ventilation, respiratory function stable and patient connected to nasal cannula oxygen Cardiovascular status: stable and blood pressure returned to baseline Postop Assessment: no apparent nausea or vomiting Anesthetic complications: no   No notable events documented.  Last Vitals:  Vitals:   08/18/20 1500 08/18/20 1521  BP: 115/79 123/70  Pulse: 73 74  Resp: 15 16  Temp:  36.6 C  SpO2: 98% 96%    Last Pain:  Vitals:   08/18/20 1545  TempSrc:   PainSc: 3                  Barnet Glasgow

## 2020-08-18 NOTE — Transfer of Care (Signed)
Immediate Anesthesia Transfer of Care Note  Patient: Savannah [name]  Procedure(s) Performed: LEFT KNEE MANIPULATION UNDER ANESTHESIA (Left: Knee)  Patient Location: PACU  Anesthesia Type:MAC  Level of Consciousness: awake, alert , oriented and patient cooperative  Airway & Oxygen Therapy: Patient Spontanous Breathing and Patient connected to face mask oxygen  Post-op Assessment: Report given to RN and Post -op Vital signs reviewed and stable  Post vital signs: Reviewed and stable  Last Vitals:  Vitals Value Taken Time  BP 96/68 08/18/20 1436  Temp    Pulse 75 08/18/20 1437  Resp 17 08/18/20 1437  SpO2 96 % 08/18/20 1437  Vitals shown include unvalidated device data.  Last Pain:  Vitals:   08/18/20 1259  TempSrc: Oral  PainSc: 4       Patients Stated Pain Goal: 5 (19/75/88 3254)  Complications: No notable events documented.

## 2020-08-18 NOTE — Op Note (Signed)
   Date of Surgery: 08/18/2020  INDICATIONS: Ms. Norling is a 60 y.o.-year-old female with arthrofibrosis of the left knee.  The Patient did consent to the procedure after discussion of the risks and benefits.  PREOPERATIVE DIAGNOSIS: Arthrofibrosis of left knee  POSTOPERATIVE DIAGNOSIS: Same.  PROCEDURE:  Manipulation of left knee joint under anesthesia Injection of left knee joint with 10 cc 0.25% bupivacaine  SURGEON: N. Eduard Roux, M.D.  ASSIST: Madalyn Rob, [place]  ANESTHESIA:  MAC anesthesia  IV FLUIDS AND URINE: See anesthesia.  COMPLICATIONS: see description of procedure.  DESCRIPTION OF PROCEDURE: The patient was brought to the operating room and remained supine on the stretcher.  The patient had been signed prior to the procedure and this was documented. The patient had the anesthesia placed by the anesthesiologist.  A time-out was performed to confirm that this was the correct patient, site, side and location. No antibiotics were indicated to be given. Manipulation of the left knee joint was then performed with audible tearing of adhesions within the knee joint.  The flexion improved from 90 degrees to 120 degrees.  I then injected 10 cc of 0.25% bupivacaine into the knee joint under sterile conditions.  Band aid was applied.  Patient tolerated the procedure well and had no immediate complications.  Azucena Cecil, MD [phone] 2:32 PM

## 2020-08-18 NOTE — Progress Notes (Signed)
AssistedDr. Houser with left, ultrasound guided, adductor canal block. Side rails up, monitors on throughout procedure. See vital signs in flow sheet. Tolerated Procedure well.  

## 2020-08-18 NOTE — Discharge Instructions (Signed)

## 2020-08-19 ENCOUNTER — Encounter (HOSPITAL_BASED_OUTPATIENT_CLINIC_OR_DEPARTMENT_OTHER): Payer: Self-pay | Admitting: Orthopaedic Surgery

## 2020-08-22 ENCOUNTER — Ambulatory Visit (INDEPENDENT_AMBULATORY_CARE_PROVIDER_SITE_OTHER): Payer: BC Managed Care – PPO | Admitting: Physical Therapy

## 2020-08-22 ENCOUNTER — Encounter: Payer: Self-pay | Admitting: Physical Therapy

## 2020-08-22 ENCOUNTER — Other Ambulatory Visit: Payer: Self-pay

## 2020-08-22 DIAGNOSIS — R262 Difficulty in walking, not elsewhere classified: Secondary | ICD-10-CM

## 2020-08-22 DIAGNOSIS — M25662 Stiffness of left knee, not elsewhere classified: Secondary | ICD-10-CM | POA: Diagnosis not present

## 2020-08-22 DIAGNOSIS — R600 Localized edema: Secondary | ICD-10-CM

## 2020-08-22 DIAGNOSIS — G8929 Other chronic pain: Secondary | ICD-10-CM

## 2020-08-22 DIAGNOSIS — M25562 Pain in left knee: Secondary | ICD-10-CM | POA: Diagnosis not present

## 2020-08-22 DIAGNOSIS — M6281 Muscle weakness (generalized): Secondary | ICD-10-CM

## 2020-08-22 NOTE — Therapy (Signed)
[facility] [place], [place], [address] Phone: [phone]   Fax:  [phone]  Physical Therapy Treatment Re-certification  Patient Details  Name: Savannah [name] MRN: [id number] Date of Birth: 1960-11-02 Referring Provider (PT): Frankey Shown, MD   Encounter Date: 08/22/2020   PT End of Session - 08/22/20 1355     Visit Number 25    Number of Visits 48    Date for PT Re-Evaluation 10/07/20    Progress Note Due on Visit 24    PT Start Time 1348    PT Stop Time 1430    PT Time Calculation (min) 42 min    Activity Tolerance Patient tolerated treatment well    Behavior During Therapy [facility] for tasks assessed/performed             Past Medical History:  Diagnosis Date   Arthritis    bilateral knees, recent LCL sprain-on left knee   CHF (congestive heart failure) ([facility])    Dysrhythmia    trigem   Environmental allergies    Family history of adverse reaction to anesthesia    slow to wake up   Fatty liver    Headache(784.0)    migraines-on meds for control, relpax, ketoprophen, vicoden, muscle relaxer   Hearing loss 2016   High frequency hearing - Bilateral    History of hiatal hernia    Hot flashes    Joint pain    Lichen sclerosus    Lipoma    Right Knee   Menorrhagia    Migraines    Necrobiosis lipoidica    Osteoarthritis    Overweight    Pelvic pain in female    PONV (postoperative nausea and vomiting)    ponv, slow to awake   Seasonal allergies    Sinus problem    Sprain 10/25/2010   left knee   Trigeminal pulse    Trigger finger, right    Middle finger and right wrist    Vitamin D deficiency     Past Surgical History:  Procedure Laterality Date   APPENDECTOMY     CHOLECYSTECTOMY     DIAGNOSTIC LAPAROSCOPY  08/2006   DILATION AND CURETTAGE OF UTERUS  2011   attempted ablation   FUNCTIONAL ENDOSCOPIC SINUS SURGERY     ganglion cyst removal     HYSTEROSCOPY     failed   KNEE ARTHROPLASTY Right  2018   meniscus   KNEE CLOSED REDUCTION Left 08/18/2020   Procedure: LEFT KNEE MANIPULATION UNDER ANESTHESIA;  Surgeon: Leandrew Koyanagi, MD;  Location: [facility];  Service: Orthopedics;  Laterality: Left;   LAPAROSCOPIC GASTRIC SLEEVE RESECTION  01/2020   LASIK     LIPOMA EXCISION Right 07/22/2014   Procedure: EXCISION RIGHT THIGH LIPOMA;  Surgeon: Erroll Luna, MD;  Location: [facility];  Service: General;  Laterality: Right;   ROBOTIC ASSISTED TOTAL HYSTERECTOMY  11/06/10   TLH/RSO   SALPINGOOPHORECTOMY  11/06/2010   Procedure: SALPINGO OOPHERECTOMY;  Surgeon: Felipa Emory;  Location: [facility];  Service: Gynecology;  Laterality: Right;   TOTAL KNEE ARTHROPLASTY Left 05/23/2020   Procedure: LEFT TOTAL KNEE ARTHROPLASTY;  Surgeon: Leandrew Koyanagi, MD;  Location: [facility];  Service: Orthopedics;  Laterality: Left;   TRIGGER FINGER RELEASE     UPPER GI ENDOSCOPY N/A 02/02/2020   Procedure: UPPER GI ENDOSCOPY;  Surgeon: [name], MD;  Location: [facility];  Service: General;  Laterality: N/A;    There were no  vitals filed for this visit.   Subjective Assessment - 08/22/20 1350     Subjective Pt s/p manipulation last Thursday 08/18/2020. Pt reporting more stiffness today. Pt stating her CPM is set at 130 degrees. Pt reporting 5/10.    Pertinent History s/p left knee manipulation on 08/18/2020, R knee scope 2018, Bariatric surgery, CHF    Limitations Sitting;House hold activities;Standing;Walking    How long can you sit comfortably? Stiffness with standing after prolonged sitting    How long can you stand comfortably? 15-20 minutes (was 10-15 minutes)    How long can you walk comfortably? 30-45 minutes (was 20-25 minutes)    Patient Stated Goals Walk better without the cane for yard work and return to work    Currently in Pain? Yes    Pain Score 5     Pain Location Knee    Pain Orientation Left    Pain Descriptors / Indicators Aching;Sore;Tightness    Pain Type Surgical  pain    Pain Onset More than a month ago    Pain Frequency Constant                [facility] PT Assessment - 08/22/20 0001       Assessment   Medical Diagnosis s/p L TKA, s/p manipulation on 08/18/2020    Referring Provider (PT) Frankey Shown, MD      AROM   Left Knee Extension -10    Left Knee Flexion 95      PROM   Left Knee Extension -6    Left Knee Flexion 105                           [facility] Adult PT Treatment/Exercise - 08/22/20 0001       Knee/Hip Exercises: Stretches   Gastroc Stretch Both;3 reps;30 seconds      Knee/Hip Exercises: Aerobic   Recumbent Bike x 15 minutes beginning with partial revolutions and progressing toward full revolutions L3, pt working on keeping her heel flexed and not hiking her left hip      Knee/Hip Exercises: Machines for Strengthening   Total Gym Leg Press 125# 2x15, Left LE only: 75# 2x15      Manual Therapy   Manual therapy comments PROM: knee fleixon/extension, IASTM to lateral quad and IT band   15 minutes                   PT Education - 08/22/20 1354     Education Details Discussed conitnuation of pt's HEP, importance of knee flexion.    Person(s) Educated Patient    Methods Explanation;Demonstration    Comprehension Verbalized understanding;Returned demonstration              PT Short Term Goals - 08/16/20 1535       PT SHORT TERM GOAL #1   Title Savannah [name] will report independence and compliance with her starter HEP addressing AROM and quadriceps strength impairments.    Status Achieved               PT Long Term Goals - 08/22/20 1410       PT LONG TERM GOAL #1   Title Improve FOTO score to 59.    Status On-going    Target Date 10/07/20      PT LONG TERM GOAL #2   Title Improve L knee pain to consistently 0-3/10 on the Numeric Pain Rating Scale.    Status Achieved  PT LONG TERM GOAL #3   Title Savannah [name] will be able to walk 1/4 mile or more from her car to her office without an  assistive device or complaints of functional difficulty.    Baseline Can walk but 1/4 mile without an AD    Status Achieved      PT LONG TERM GOAL #4   Status On-going    Target Date 10/07/20      PT LONG TERM GOAL #5   Title Savannah [name] will be independent and compliant with her long-term HEP at DC.    Status Achieved                   Plan - 08/22/20 1404     Clinical Impression Statement Pt arriving s/p left knee manipulation on 08/18/2020. Pt reporting 5/10 pain today upon arrival. Follow up with Dr. Erlinda Hong after 3 therapy visits this week. I am requesting 3x/ week for first 3 weeks and progressing to two times each week up to 15 visits and 6 weeks. Pt's AROM of left knee is -10-95 degrees, PROM is -6 to 105 degrees in supine. Skilled PT neeeded to progress toward pt's LTG's not met.    Personal Factors and Comorbidities Comorbidity 3+    Comorbidities Previous R knee scope in 2018, bariatric surgery, CHF    Examination-Activity Limitations Bathing;Dressing;Sit;Transfers;Bed Mobility;Sleep;Bend;Lift;Squat;Locomotion Level;Stairs;Carry;Stand    Stability/Clinical Decision Making Stable/Uncomplicated    Clinical Decision Making Low    Rehab Potential Good    PT Frequency 3x / week    PT Duration 8 weeks    PT Treatment/Interventions ADLs/Self Care Home Management;Electrical Stimulation;Cryotherapy;Therapeutic activities;Stair training;Gait training;Therapeutic exercise;Balance training;Neuromuscular re-education;Patient/family education;Manual techniques;Vasopneumatic Device    PT Next Visit Plan ROM focus flexion and extension, hip/knee strength    PT Home Exercise Plan Access Code: [email or web address]    Consulted and Agree with Plan of Care Patient             Patient will benefit from skilled therapeutic intervention in order to improve the following deficits and impairments:  Abnormal gait, Decreased activity tolerance, Decreased balance, Decreased endurance, Decreased range of motion,  Decreased strength, Difficulty walking, Increased edema, Impaired flexibility, Pain, Obesity  Visit Diagnosis: Difficulty walking  Muscle weakness (generalized)  Stiffness of left knee, not elsewhere classified  Chronic pain of left knee  Localized edema     Problem List Patient Active Problem List   Diagnosis Date Noted   Stiffness of left knee 08/18/2020   Status post total left knee replacement 05/23/2020   Primary osteoarthritis of left knee 05/05/2020   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   Fatty liver disease, nonalcoholic 66/44/0347   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   Trigger finger, right middle finger 10/10/2018   Other insomnia 06/23/2018   Vitamin D deficiency 03/26/2018   Prediabetes 03/11/2018   Obesity 02/26/2018   Migraines 10/01/2016   Decreased cardiac ejection fraction 10/01/2016   Severe obesity ([place]) 10/01/2016   Shingles 08/03/2016   Chronic pain of right knee 05/24/2016   Unilateral primary osteoarthritis, right knee 05/24/2016   Posterior tibial tendinitis, right leg 42/59/5638   Lichen sclerosus 75/64/3329    Oretha Caprice, PT, MPT 08/22/2020, 2:38 PM  [facility] [address] [place], [place], [address] Phone: [phone]   Fax:  [phone]  Name: Savannah [name] MRN: [id number] Date of Birth: July 15, 1960

## 2020-08-23 ENCOUNTER — Ambulatory Visit (INDEPENDENT_AMBULATORY_CARE_PROVIDER_SITE_OTHER): Payer: BC Managed Care – PPO | Admitting: Physical Therapy

## 2020-08-23 DIAGNOSIS — G8929 Other chronic pain: Secondary | ICD-10-CM

## 2020-08-23 DIAGNOSIS — M25662 Stiffness of left knee, not elsewhere classified: Secondary | ICD-10-CM | POA: Diagnosis not present

## 2020-08-23 DIAGNOSIS — R600 Localized edema: Secondary | ICD-10-CM

## 2020-08-23 DIAGNOSIS — R262 Difficulty in walking, not elsewhere classified: Secondary | ICD-10-CM

## 2020-08-23 DIAGNOSIS — M6281 Muscle weakness (generalized): Secondary | ICD-10-CM

## 2020-08-23 DIAGNOSIS — M25562 Pain in left knee: Secondary | ICD-10-CM

## 2020-08-23 NOTE — Therapy (Signed)
[facility] [address] [place], [place], [address] Phone: [phone]   Fax:  [phone]  Physical Therapy Treatment  Patient Details  Name: Savannah [name] MRN: [id number] Date of Birth: [birth year] Referring Provider (PT): Frankey Shown, MD   Encounter Date: 08/23/2020   PT End of Session - 08/23/20 1308     Visit Number 26    Number of Visits 52    Date for PT Re-Evaluation 10/07/20    Progress Note Due on Visit 42    PT Start Time 1300    PT Stop Time 1345    PT Time Calculation (min) 45 min    Activity Tolerance Patient tolerated treatment well    Behavior During Therapy [facility] for tasks assessed/performed             Past Medical History:  Diagnosis Date   Arthritis    bilateral knees, recent LCL sprain-on left knee   CHF (congestive heart failure) ([place])    Dysrhythmia    trigem   Environmental allergies    Family history of adverse reaction to anesthesia    slow to wake up   Fatty liver    Headache(784.0)    migraines-on meds for control, relpax, ketoprophen, vicoden, muscle relaxer   Hearing loss 2016   High frequency hearing - Bilateral    History of hiatal hernia    Hot flashes    Joint pain    Lichen sclerosus    Lipoma    Right Knee   Menorrhagia    Migraines    Necrobiosis lipoidica    Osteoarthritis    Overweight    Pelvic pain in female    PONV (postoperative nausea and vomiting)    ponv, slow to awake   Seasonal allergies    Sinus problem    Sprain 10/25/2010   left knee   Trigeminal pulse    Trigger finger, right    Middle finger and right wrist    Vitamin D deficiency     Past Surgical History:  Procedure Laterality Date   APPENDECTOMY     CHOLECYSTECTOMY     DIAGNOSTIC LAPAROSCOPY  08/2006   DILATION AND CURETTAGE OF UTERUS  2011   attempted ablation   FUNCTIONAL ENDOSCOPIC SINUS SURGERY     ganglion cyst removal     HYSTEROSCOPY     failed   KNEE ARTHROPLASTY Right 2018   meniscus    KNEE CLOSED REDUCTION Left 08/18/2020   Procedure: LEFT KNEE MANIPULATION UNDER ANESTHESIA;  Surgeon: Leandrew Koyanagi, MD;  Location: [place];  Service: Orthopedics;  Laterality: Left;   LAPAROSCOPIC GASTRIC SLEEVE RESECTION  01/2020   LASIK     LIPOMA EXCISION Right 07/22/2014   Procedure: EXCISION RIGHT THIGH LIPOMA;  Surgeon: Erroll Luna, MD;  Location: [place];  Service: General;  Laterality: Right;   ROBOTIC ASSISTED TOTAL HYSTERECTOMY  11/06/10   TLH/RSO   SALPINGOOPHORECTOMY  11/06/2010   Procedure: SALPINGO OOPHERECTOMY;  Surgeon: Felipa Emory;  Location: [facility];  Service: Gynecology;  Laterality: Right;   TOTAL KNEE ARTHROPLASTY Left 05/23/2020   Procedure: LEFT TOTAL KNEE ARTHROPLASTY;  Surgeon: Leandrew Koyanagi, MD;  Location: [place];  Service: Orthopedics;  Laterality: Left;   TRIGGER FINGER RELEASE     UPPER GI ENDOSCOPY N/A 02/02/2020   Procedure: UPPER GI ENDOSCOPY;  Surgeon: [name], MD;  Location: [facility];  Service: General;  Laterality: N/A;    There were no vitals  filed for this visit.   Subjective Assessment - 08/23/20 1253     Subjective Pt states she is very sore and stiff today. However, pain is down.    Pertinent History s/p left knee manipulation on 08/18/2020, R knee scope 2018, Bariatric surgery, CHF    Limitations Sitting;House hold activities;Standing;Walking    How long can you sit comfortably? Stiffness with standing after prolonged sitting    How long can you stand comfortably? 15-20 minutes (was 10-15 minutes)    How long can you walk comfortably? 30-45 minutes (was 20-25 minutes)    Patient Stated Goals Walk better without the cane for yard work and return to work    Currently in Pain? Yes    Pain Score 3     Pain Location Knee    Pain Orientation Left    Pain Descriptors / Indicators Sore    Pain Onset More than a month ago                               [facility] Adult PT Treatment/Exercise -  08/23/20 0001       Knee/Hip Exercises: Stretches   Piriformis Stretch Limitations 30s 3x    Gastroc Stretch Both;3 reps;30 seconds    Other Knee/Hip Stretches S/L quad stretch 60s 3x    Other Knee/Hip Stretches standing adductor stretch 30s 3x      Knee/Hip Exercises: Aerobic   Recumbent Bike 10 minutes partial rev, heel flat and reduce hip hike      Knee/Hip Exercises: Machines for Strengthening   Total Gym Leg Press 125# 2x15, Left LE only: 75# 2x15      Knee/Hip Exercises: Supine   Quad Sets Limitations with heel prop 15x 3s hold    Heel Slides Limitations with strap 10x 10s      Manual Therapy   Manual therapy comments grade IV knee flexion and ext mob; tibial APflexion and tibial ER ext                      PT Short Term Goals - 08/16/20 1535       PT SHORT TERM GOAL #1   Title [name] will report independence and compliance with her starter HEP addressing AROM and quadriceps strength impairments.    Status Achieved               PT Long Term Goals - 08/22/20 1410       PT LONG TERM GOAL #1   Title Improve FOTO score to 59.    Status On-going    Target Date 10/07/20      PT LONG TERM GOAL #2   Title Improve L knee pain to consistently 0-3/10 on the Numeric Pain Rating Scale.    Status Achieved      PT LONG TERM GOAL #3   Title [name] will be able to walk 1/4 mile or more from her car to her office without an assistive device or complaints of functional difficulty.    Baseline Can walk but 1/4 mile without an AD    Status Achieved      PT LONG TERM GOAL #4   Status On-going    Target Date 10/07/20      PT LONG TERM GOAL #5   Title [name] will be independent and compliant with her long-term HEP at DC.    Status Achieved  Plan - 08/23/20 1337     Clinical Impression Statement Pt with increased L knee swelling and lateral pain at today's session. Pt with increased lateral patellar pouch edema limiting knee flexion  ROM due to a "steady" pain. Pt did respond well to flexion and extension mobilizations as well as generalized lateral L thigh stretching. Pt locates pain around Gerdy's tubercle so introduced gluteal stretching to potentially relieve tensile stress. 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  Pt would benefit from continued skilled therapy in order to reach goals and maximize functional L LE strength and ROM for full return to PLOF.    Personal Factors and Comorbidities Comorbidity 3+    Comorbidities Previous R knee scope in 2018, bariatric surgery, CHF    Examination-Activity Limitations Bathing;Dressing;Sit;Transfers;Bed Mobility;Sleep;Bend;Lift;Squat;Locomotion Level;Stairs;Carry;Stand    Stability/Clinical Decision Making Stable/Uncomplicated    Rehab Potential Good    PT Frequency 3x / week    PT Duration 8 weeks    PT Treatment/Interventions ADLs/Self Care Home Management;Electrical Stimulation;Cryotherapy;Therapeutic activities;Stair training;Gait training;Therapeutic exercise;Balance training;Neuromuscular re-education;Patient/family education;Manual techniques;Vasopneumatic Device    PT Next Visit Plan ROM focus flexion and extension, hip/knee strength    PT Home Exercise Plan Access Code: [email or web address]    Consulted and Agree with Plan of Care Patient             Patient will benefit from skilled therapeutic intervention in order to improve the following deficits and impairments:  Abnormal gait, Decreased activity tolerance, Decreased balance, Decreased endurance, Decreased range of motion, Decreased strength, Difficulty walking, Increased edema, Impaired flexibility, Pain, Obesity  Visit Diagnosis: Difficulty walking  Muscle weakness (generalized)  Stiffness of left knee, not elsewhere classified  Chronic pain of left knee  Localized edema     Problem List Patient Active Problem List   Diagnosis Date Noted    Stiffness of left knee 08/18/2020   Status post total left knee replacement 05/23/2020   Primary osteoarthritis of left knee 05/05/2020   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   Fatty liver disease, nonalcoholic 79/89/2119   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   Trigger finger, right middle finger 10/10/2018   Other insomnia 06/23/2018   Vitamin D deficiency 03/26/2018   Prediabetes 03/11/2018   Obesity 02/26/2018   Migraines 10/01/2016   Decreased cardiac ejection fraction 10/01/2016   Severe obesity ([place]) 10/01/2016   Shingles 08/03/2016   Chronic pain of right knee 05/24/2016   Unilateral primary osteoarthritis, right knee 05/24/2016   Posterior tibial tendinitis, right leg 41/74/0814   Lichen sclerosus 48/18/5631    Daleen Bo PT, DPT 08/23/20 2:16 PM   [facility] [address] [place], [place], [address] Phone: [phone]   Fax:  [phone]  Name: MINAMI ARRIAGA MRN: [id number] Date of Birth: August 03, 1960

## 2020-08-24 ENCOUNTER — Ambulatory Visit (INDEPENDENT_AMBULATORY_CARE_PROVIDER_SITE_OTHER): Payer: BC Managed Care – PPO | Admitting: Physical Therapy

## 2020-08-24 DIAGNOSIS — G8929 Other chronic pain: Secondary | ICD-10-CM

## 2020-08-24 DIAGNOSIS — M25562 Pain in left knee: Secondary | ICD-10-CM | POA: Diagnosis not present

## 2020-08-24 DIAGNOSIS — M6281 Muscle weakness (generalized): Secondary | ICD-10-CM | POA: Diagnosis not present

## 2020-08-24 DIAGNOSIS — M25662 Stiffness of left knee, not elsewhere classified: Secondary | ICD-10-CM | POA: Diagnosis not present

## 2020-08-24 DIAGNOSIS — R600 Localized edema: Secondary | ICD-10-CM

## 2020-08-24 DIAGNOSIS — R262 Difficulty in walking, not elsewhere classified: Secondary | ICD-10-CM | POA: Diagnosis not present

## 2020-08-25 ENCOUNTER — Encounter: Payer: Self-pay | Admitting: Orthopaedic Surgery

## 2020-08-25 ENCOUNTER — Other Ambulatory Visit: Payer: Self-pay

## 2020-08-25 ENCOUNTER — Ambulatory Visit (INDEPENDENT_AMBULATORY_CARE_PROVIDER_SITE_OTHER): Payer: BC Managed Care – PPO | Admitting: Orthopaedic Surgery

## 2020-08-25 VITALS — Ht 65.0 in | Wt 217.0 lb

## 2020-08-25 DIAGNOSIS — M25662 Stiffness of left knee, not elsewhere classified: Secondary | ICD-10-CM

## 2020-08-25 MED ORDER — METAXALONE 800 MG PO TABS: 800.0000 mg | ORAL_TABLET | Freq: Three times a day (TID) | ORAL | 6 refills | Status: DC

## 2020-08-25 NOTE — Therapy (Signed)
[facility] [address] [place], [place], [address] Phone: [phone]   Fax:  [phone]  Physical Therapy Treatment  Patient Details  Name: Savannah [name] MRN: [id number] Date of Birth: 30-Sep-1960 Referring Provider (PT): Savannah Shown, MD   Encounter Date: 08/24/2020   PT End of Session - 08/25/20 0803     Visit Number 27    Number of Visits 18    Date for PT Re-Evaluation 10/07/20    Progress Note Due on Visit 80    PT Start Time 1300    PT Stop Time 1345    PT Time Calculation (min) 45 min    Activity Tolerance Patient tolerated treatment well    Behavior During Therapy [facility] for tasks assessed/performed             Past Medical History:  Diagnosis Date   Arthritis    bilateral knees, recent LCL sprain-on left knee   CHF (congestive heart failure) ([place])    Dysrhythmia    trigem   Environmental allergies    Family history of adverse reaction to anesthesia    slow to wake up   Fatty liver    Headache(784.0)    migraines-on meds for control, relpax, ketoprophen, vicoden, muscle relaxer   Hearing loss 2016   High frequency hearing - Bilateral    History of hiatal hernia    Hot flashes    Joint pain    Lichen sclerosus    Lipoma    Right Knee   Menorrhagia    Migraines    Necrobiosis lipoidica    Osteoarthritis    Overweight    Pelvic pain in female    PONV (postoperative nausea and vomiting)    ponv, slow to awake   Seasonal allergies    Sinus problem    Sprain 10/25/2010   left knee   Trigeminal pulse    Trigger finger, right    Middle finger and right wrist    Vitamin D deficiency     Past Surgical History:  Procedure Laterality Date   APPENDECTOMY     CHOLECYSTECTOMY     DIAGNOSTIC LAPAROSCOPY  08/2006   DILATION AND CURETTAGE OF UTERUS  2011   attempted ablation   FUNCTIONAL ENDOSCOPIC SINUS SURGERY     ganglion cyst removal     HYSTEROSCOPY     failed   KNEE ARTHROPLASTY Right 2018   meniscus    KNEE CLOSED REDUCTION Left 08/18/2020   Procedure: LEFT KNEE MANIPULATION UNDER ANESTHESIA;  Surgeon: Leandrew Koyanagi, MD;  Location: [place];  Service: Orthopedics;  Laterality: Left;   LAPAROSCOPIC GASTRIC SLEEVE RESECTION  01/2020   LASIK     LIPOMA EXCISION Right 07/22/2014   Procedure: EXCISION RIGHT THIGH LIPOMA;  Surgeon: Erroll Luna, MD;  Location: [place];  Service: General;  Laterality: Right;   ROBOTIC ASSISTED TOTAL HYSTERECTOMY  11/06/10   TLH/RSO   SALPINGOOPHORECTOMY  11/06/2010   Procedure: SALPINGO OOPHERECTOMY;  Surgeon: Felipa Emory;  Location: [facility];  Service: Gynecology;  Laterality: Right;   TOTAL KNEE ARTHROPLASTY Left 05/23/2020   Procedure: LEFT TOTAL KNEE ARTHROPLASTY;  Surgeon: Leandrew Koyanagi, MD;  Location: [place];  Service: Orthopedics;  Laterality: Left;   TRIGGER FINGER RELEASE     UPPER GI ENDOSCOPY N/A 02/02/2020   Procedure: UPPER GI ENDOSCOPY;  Surgeon: [name], MD;  Location: [facility];  Service: General;  Laterality: N/A;    There were no vitals  filed for this visit.   Subjective Assessment - 08/25/20 0802     Subjective Pt states she is very sore and stiff today. However, pain is down.    Patient is accompained by: Family member    Pertinent History s/p left knee manipulation on 08/18/2020, R knee scope 2018, Bariatric surgery, CHF    Limitations Sitting;House hold activities;Standing;Walking    How long can you sit comfortably? Stiffness with standing after prolonged sitting    How long can you stand comfortably? 15-20 minutes (was 10-15 minutes)    How long can you walk comfortably? 30-45 minutes (was 20-25 minutes)    Patient Stated Goals Walk better without the cane for yard work and return to work    Pain Onset More than a month ago                [facility] PT Assessment - 08/25/20 0001       Assessment   Medical Diagnosis s/p L TKA, s/p manipulation on 08/18/2020    Referring Provider (PT) Savannah Shown,  MD      AROM   Left Knee Extension -6    Left Knee Flexion 109      PROM   Left Knee Extension -5    Left Knee Flexion 112                           [facility] Adult PT Treatment/Exercise - 08/25/20 0001       Knee/Hip Exercises: Stretches   Sports administrator Left;3 reps;60 seconds    Quad Stretch Limitations prone with strap    Hip Flexor Stretch Left;3 reps;30 seconds    Hip Flexor Stretch Limitations supine with strap    Knee: Self-Stretch Limitations tailgate stretch 10 sec X 3 min      Knee/Hip Exercises: Aerobic   Recumbent Bike 10 minutes full revolutions      Knee/Hip Exercises: Machines for Strengthening   Total Gym Leg Press 125# 2x15, Left LE only: 75# 2x15      Manual Therapy   Manual therapy comments Lt knee PROM, flexion mobs, MET contract-relax for quad stretching, IASTM with suction to quads with manual stretching into flexion.                      PT Short Term Goals - 08/16/20 1535       PT SHORT TERM GOAL #1   Title [name] will report independence and compliance with her starter HEP addressing AROM and quadriceps strength impairments.    Status Achieved               PT Long Term Goals - 08/22/20 1410       PT LONG TERM GOAL #1   Title Improve FOTO score to 59.    Status On-going    Target Date 10/07/20      PT LONG TERM GOAL #2   Title Improve L knee pain to consistently 0-3/10 on the Numeric Pain Rating Scale.    Status Achieved      PT LONG TERM GOAL #3   Title [name] will be able to walk 1/4 mile or more from her car to her office without an assistive device or complaints of functional difficulty.    Baseline Can walk but 1/4 mile without an AD    Status Achieved      PT LONG TERM GOAL #4   Status On-going    Target  Date 10/07/20      PT LONG TERM GOAL #5   Title [name] will be independent and compliant with her long-term HEP at DC.    Status Achieved                   Plan - 08/25/20 0804      Clinical Impression Statement She had significant improvement in knee flexion ROM today after extensive stretching and manual therapy. However when we work to improve flexion it causes her be more stiff with extension. PT encouraged her to try to alternate her focus each day for which way she will work to improve ROM, for example since today was a flexion focus today tommorow will be an extension focus day. We will have her try this to see if it improves her overall ROM. Continued CPM machine use was also still recommended.    Personal Factors and Comorbidities Comorbidity 3+    Comorbidities Previous R knee scope in 2018, bariatric surgery, CHF    Examination-Activity Limitations Bathing;Dressing;Sit;Transfers;Bed Mobility;Sleep;Bend;Lift;Squat;Locomotion Level;Stairs;Carry;Stand    Stability/Clinical Decision Making Stable/Uncomplicated    Rehab Potential Good    PT Frequency 3x / week    PT Duration 8 weeks    PT Treatment/Interventions ADLs/Self Care Home Management;Electrical Stimulation;Cryotherapy;Therapeutic activities;Stair training;Gait training;Therapeutic exercise;Balance training;Neuromuscular re-education;Patient/family education;Manual techniques;Vasopneumatic Device    PT Next Visit Plan ROM focus flexion and extension, hip/knee strength    PT Home Exercise Plan Access Code: [email or web address]    Consulted and Agree with Plan of Care Patient             Patient will benefit from skilled therapeutic intervention in order to improve the following deficits and impairments:  Abnormal gait, Decreased activity tolerance, Decreased balance, Decreased endurance, Decreased range of motion, Decreased strength, Difficulty walking, Increased edema, Impaired flexibility, Pain, Obesity  Visit Diagnosis: Difficulty walking  Muscle weakness (generalized)  Stiffness of left knee, not elsewhere classified  Chronic pain of left knee  Localized edema     Problem List Patient Active Problem List    Diagnosis Date Noted   Stiffness of left knee 08/18/2020   Status post total left knee replacement 05/23/2020   Primary osteoarthritis of left knee 05/05/2020   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   Fatty liver disease, nonalcoholic 57/32/2025   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   Trigger finger, right middle finger 10/10/2018   Other insomnia 06/23/2018   Vitamin D deficiency 03/26/2018   Prediabetes 03/11/2018   Obesity 02/26/2018   Migraines 10/01/2016   Decreased cardiac ejection fraction 10/01/2016   Severe obesity ([place]) 10/01/2016   Shingles 08/03/2016   Chronic pain of right knee 05/24/2016   Unilateral primary osteoarthritis, right knee 05/24/2016   Posterior tibial tendinitis, right leg 42/70/6237   Lichen sclerosus 62/83/1517    Silvestre Mesi 08/25/2020, 8:07 AM  [facility] [address] [place], [place], [address] Phone: [phone]   Fax:  [phone]  Name: Savannah [name] MRN: [id number] Date of Birth: Jan 22, 1961

## 2020-08-25 NOTE — Progress Notes (Signed)
Post-Op Visit Note   Patient: Savannah [name]           Date of Birth: 09-12-1960           MRN: [id number] Visit Date: 08/25/2020 PCP: Lawerance Cruel, MD   Assessment & Plan:  Chief Complaint:  Chief Complaint  Patient presents with   Left Knee - Follow-up    Left knee manipulation 08/18/2020   Visit Diagnoses:  1. Stiffness of left knee     Plan: Savannah [name] is 1 week status post left knee manipulation on 08/18/2020.  She states that she feels significant soreness in the knee.  Muscle relaxers are currently not helping.  She is started physical therapy and has also been on the CPM.  Her left knee range of motion is improved.  Flexion to about 110 degrees.  She has lost a little bit of extension.  She will continue with her physical therapy and CPM.  I think once the swelling goes down she will be able to improve her range of motion even more.  I changed her muscle relaxer to Skelaxin to see if this will work better than Robaxin.  Recheck in 4 weeks.  Follow-Up Instructions: Return in about 4 weeks (around 09/22/2020).   Orders:  No orders of the defined types were placed in this encounter.  Meds ordered this encounter  Medications   metaxalone (SKELAXIN) 800 MG tablet    Sig: Take 1 tablet (800 mg total) by mouth 3 (three) times daily.    Dispense:  20 tablet    Refill:  6    Imaging: No results found.  PMFS History: Patient Active Problem List   Diagnosis Date Noted   Stiffness of left knee 08/18/2020   Status post total left knee replacement 05/23/2020   Primary osteoarthritis of left knee 05/05/2020   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   Fatty liver disease, nonalcoholic 92/12/9415   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   Trigger finger, right middle finger 10/10/2018   Other insomnia 06/23/2018   Vitamin D deficiency 03/26/2018   Prediabetes 03/11/2018   Obesity 02/26/2018   Migraines 10/01/2016   Decreased cardiac ejection fraction  10/01/2016   Severe obesity ([place]) 10/01/2016   Shingles 08/03/2016   Chronic pain of right knee 05/24/2016   Unilateral primary osteoarthritis, right knee 05/24/2016   Posterior tibial tendinitis, right leg 40/81/4481   Lichen sclerosus 85/63/1497   Past Medical History:  Diagnosis Date   Arthritis    bilateral knees, recent LCL sprain-on left knee   CHF (congestive heart failure) ([place])    Dysrhythmia    trigem   Environmental allergies    Family history of adverse reaction to anesthesia    slow to wake up   Fatty liver    Headache(784.0)    migraines-on meds for control, relpax, ketoprophen, vicoden, muscle relaxer   Hearing loss 2016   High frequency hearing - Bilateral    History of hiatal hernia    Hot flashes    Joint pain    Lichen sclerosus    Lipoma    Right Knee   Menorrhagia    Migraines    Necrobiosis lipoidica    Osteoarthritis    Overweight    Pelvic pain in female    PONV (postoperative nausea and vomiting)    ponv, slow to awake   Seasonal allergies    Sinus problem    Sprain 10/25/2010   left knee   Trigeminal [name]  Trigger finger, right    Middle finger and right wrist    Vitamin D deficiency     Family History  Problem Relation Age of Onset   Skin cancer Mother    Migraines Mother    High blood pressure Mother    Heart disease Father    High blood pressure Father    High Cholesterol Father    Breast cancer Maternal Aunt 54   Breast cancer Paternal Aunt 31   Migraines Brother     Past Surgical History:  Procedure Laterality Date   APPENDECTOMY     CHOLECYSTECTOMY     DIAGNOSTIC LAPAROSCOPY  08/2006   DILATION AND CURETTAGE OF UTERUS  2011   attempted ablation   FUNCTIONAL ENDOSCOPIC SINUS SURGERY     ganglion cyst removal     HYSTEROSCOPY     failed   KNEE ARTHROPLASTY Right 2018   meniscus   KNEE CLOSED REDUCTION Left 08/18/2020   Procedure: LEFT KNEE MANIPULATION UNDER ANESTHESIA;  Surgeon: Leandrew Koyanagi, MD;  Location: [place];  Service: Orthopedics;  Laterality: Left;   LAPAROSCOPIC GASTRIC SLEEVE RESECTION  01/2020   LASIK     LIPOMA EXCISION Right 07/22/2014   Procedure: EXCISION RIGHT THIGH LIPOMA;  Surgeon: Erroll Luna, MD;  Location: [place];  Service: General;  Laterality: Right;   ROBOTIC ASSISTED TOTAL HYSTERECTOMY  11/06/10   TLH/RSO   SALPINGOOPHORECTOMY  11/06/2010   Procedure: SALPINGO OOPHERECTOMY;  Surgeon: Felipa Emory;  Location: [facility];  Service: Gynecology;  Laterality: Right;   TOTAL KNEE ARTHROPLASTY Left 05/23/2020   Procedure: LEFT TOTAL KNEE ARTHROPLASTY;  Surgeon: Leandrew Koyanagi, MD;  Location: [place];  Service: Orthopedics;  Laterality: Left;   TRIGGER FINGER RELEASE     UPPER GI ENDOSCOPY N/A 02/02/2020   Procedure: UPPER GI ENDOSCOPY;  Surgeon: [name], MD;  Location: [facility];  Service: General;  Laterality: N/A;   Social History   Occupational History   Occupation: Administrator, [employer]  Tobacco Use   Smoking status: Never   Smokeless tobacco: Never  Vaping Use   Vaping Use: Never used  Substance and Sexual Activity   Alcohol use: No    Alcohol/week: 0.0 standard drinks   Drug use: No   Sexual activity: Yes    Birth control/protection: Surgical    Comment: TLH/RSO

## 2020-08-31 NOTE — Progress Notes (Signed)
PATIENT: Savannah [name] DOB: 29-Aug-1960  REASON FOR VISIT: follow up HISTORY FROM: patient Primary Neurologist: Dr. Jannifer Franklin  HISTORY OF PRESENT ILLNESS: Today 09/01/20  Savannah [name] here today to follow-up on migraine headaches, is doing excellent.  She had bariatric surgery in December 2021.  She stopped Effexor prior to bariatric surgery due to the pill being too large.  Fortunately, she has had very few headaches.  Last week, she did have a cluster of migraines, but Relpax worked excellent, otherwise no migraines.  She has done well to lose about 60 pounds.  In April 2022, she had a left knee replacement, had scar tissue development, had to have it removed recently.  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  She is not sure if her headaches will increase when she goes back full-time.  Here today for evaluation unaccompanied.  Update 09/01/2019 SS: [name] is a 60 year old female history of migraine headaches.  She is on Effexor and Relpax as needed.  She has previously failed Topamax.  Over the last several weeks, her headaches have increased, likely related to work-related stress.  On average now having 2-3 migraines a week.  She takes Relpax with good benefit.  She is considering bariatric surgery.  She has chronic left knee pain, needs replacement.  Seeing chiropractor tomorrow.  Presents today for evaluation unaccompanied for  HISTORY 08/26/2018 SS: [name] is a 60 year old female with history of migraine headaches.  She has failed Topamax in the past.  She remains on Effexor 75 mg daily, Relpax as needed. She has had 2 headache in the last 6 weeks. This is a good control for her.  She will take Relpax and the headaches subside. She says triggers are stress and weather. 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 Her overall health has been good since last visit. She is in a weight reduction program. She is now down 25 lbs in the last 6 months. She is  having surgery for trigger finger next week, planning left knee replacement if she loses more weight. On average she may take 2 relpax per month. She sees her women's health doctor for primary care.  She presents for follow-up unaccompanied.   REVIEW OF SYSTEMS: Out of a complete 14 system review of symptoms, the patient complains only of the following symptoms, and all other reviewed systems are negative.  Headache  ALLERGIES: Allergies  Allergen Reactions   Dust Mite Extract Other (See Comments)    Environmental allergies   Nsaids Other (See Comments)    avoided due to bariatric surgery     HOME MEDICATIONS: Outpatient Medications Prior to Visit  Medication Sig Dispense Refill   amoxicillin (AMOXIL) 500 MG tablet Take four pills one hour prior to dental work 8 tablet 2   Ca Phosphate-Cholecalciferol (CALCIUM 500 + D3) 250-500 MG-UNIT CHEW Chew 1 tablet by mouth in the morning, at noon, and at bedtime. Bariatric Calcium Citrate chewy     carvedilol (COREG) 6.25 MG tablet Take 1 tablet (6.25 mg total) by mouth 2 (two) times daily with a meal. 180 tablet 3   clobetasol cream (TEMOVATE) 3.97 % Apply 1 application topically as needed (lichen sclerosus). (Patient taking differently: Apply 1 application topically in the morning and at bedtime.) 60 g 1   eletriptan (RELPAX) 40 MG tablet TAKE 1 TABLET TWICE A DAY  AS NEEDED FOR MIGRAINE OR  HEADACHE 6 tablet 9   furosemide (LASIX)  20 MG tablet Take 0.5 tablets (10 mg total) by mouth daily as needed for edema. 45 tablet 3   hyoscyamine (LEVSIN) 0.125 MG tablet Take 0.125 mg by mouth every 4 (four) hours as needed (abdominal cramps/spasms).     lisinopril (ZESTRIL) 10 MG tablet TAKE 1 TABLET DAILY 90 tablet 3   meclizine (ANTIVERT) 25 MG tablet Take 25 mg by mouth 4 (four) times daily as needed for dizziness.     metaxalone (SKELAXIN) 800 MG tablet Take 1 tablet (800 mg total) by mouth 3 (three) times daily. 20 tablet 6   methocarbamol (ROBAXIN)  500 MG tablet Take 1 tablet (500 mg total) by mouth 2 (two) times daily as needed. 20 tablet 0   mometasone (ELOCON) 0.1 % ointment Use small topically twice weekly for maintenance. (Patient taking differently: Apply 1 application topically See admin instructions. Use small topically twice weekly as needed for irritation) 45 g 3   Multiple Vitamins-Minerals (BARIATRIC MULTIVITAMINS/IRON PO) Take 1 tablet by mouth in the morning. BariMelts Multivitamin W/Iron (chewable)     multivitamin-lutein (OCUVITE-LUTEIN) CAPS capsule Take 1 capsule by mouth in the morning.     oxyCODONE-acetaminophen (PERCOCET) 5-325 MG tablet Take 1-2 tablets by mouth every 8 (eight) hours as needed. 40 tablet 0   methocarbamol (ROBAXIN) 500 MG tablet Take 1 tablet (500 mg total) by mouth 2 (two) times daily as needed. To be taken post-op 30 tablet 6   No facility-administered medications prior to visit.    PAST MEDICAL HISTORY: Past Medical History:  Diagnosis Date   Arthritis    bilateral knees, recent LCL sprain-on left knee   CHF (congestive heart failure) (HCC)    Dysrhythmia    trigem   Environmental allergies    Family history of adverse reaction to anesthesia    slow to wake up   Fatty liver    Headache(784.0)    migraines-on meds for control, relpax, ketoprophen, vicoden, muscle relaxer   Hearing loss 2016   High frequency hearing - Bilateral    History of hiatal hernia    Hot flashes    Joint pain    Lichen sclerosus    Lipoma    Right Knee   Menorrhagia    Migraines    Necrobiosis lipoidica    Osteoarthritis    Overweight    Pelvic pain in female    PONV (postoperative nausea and vomiting)    ponv, slow to awake   Seasonal allergies    Sinus problem    Sprain 10/25/2010   left knee   Trigeminal pulse    Trigger finger, right    Middle finger and right wrist    Vitamin D deficiency     PAST SURGICAL HISTORY: Past Surgical History:  Procedure Laterality Date   APPENDECTOMY      CHOLECYSTECTOMY     DIAGNOSTIC LAPAROSCOPY  08/2006   DILATION AND CURETTAGE OF UTERUS  2011   attempted ablation   FUNCTIONAL ENDOSCOPIC SINUS SURGERY     ganglion cyst removal     HYSTEROSCOPY     failed   KNEE ARTHROPLASTY Right 2018   meniscus   KNEE CLOSED REDUCTION Left 08/18/2020   Procedure: LEFT KNEE MANIPULATION UNDER ANESTHESIA;  Surgeon: Leandrew Koyanagi, MD;  Location: [facility];  Service: Orthopedics;  Laterality: Left;   LAPAROSCOPIC GASTRIC SLEEVE RESECTION  01/2020   LASIK     LIPOMA EXCISION Right 07/22/2014   Procedure: EXCISION RIGHT THIGH LIPOMA;  Surgeon: [name]  Cornett, MD;  Location: [facility];  Service: General;  Laterality: Right;   ROBOTIC ASSISTED TOTAL HYSTERECTOMY  11/06/10   TLH/RSO   SALPINGOOPHORECTOMY  11/06/2010   Procedure: SALPINGO OOPHERECTOMY;  Surgeon: Felipa Emory;  Location: [facility];  Service: Gynecology;  Laterality: Right;   TOTAL KNEE ARTHROPLASTY Left 05/23/2020   Procedure: LEFT TOTAL KNEE ARTHROPLASTY;  Surgeon: Leandrew Koyanagi, MD;  Location: [facility];  Service: Orthopedics;  Laterality: Left;   TRIGGER FINGER RELEASE     UPPER GI ENDOSCOPY N/A 02/02/2020   Procedure: UPPER GI ENDOSCOPY;  Surgeon: [name], MD;  Location: [facility];  Service: General;  Laterality: N/A;    FAMILY HISTORY: Family History  Problem Relation Age of Onset   Skin cancer Mother    Migraines Mother    High blood pressure Mother    Heart disease Father    High blood pressure Father    High Cholesterol Father    Breast cancer Maternal Aunt 70   Breast cancer Paternal Aunt 36   Migraines Brother     SOCIAL HISTORY: Social History   Socioeconomic History   Marital status: Married    Spouse name: Eddie Dibbles Dietzman   Number of children: 0   Years of education: 18   Highest education level: Not on file  Occupational History   Occupation: Administrator, [employer] Service  Tobacco Use   Smoking status: Never   Smokeless tobacco: Never  Vaping Use    Vaping Use: Never used  Substance and Sexual Activity   Alcohol use: No    Alcohol/week: 0.0 standard drinks   Drug use: No   Sexual activity: Yes    Birth control/protection: Surgical    Comment: TLH/RSO  Other Topics Concern   Not on file  Social History Narrative   Lives w/ husband, Eddie Dibbles   Caffeine use: Diet coke girl   Right handed    Social Determinants of Health   Financial Resource Strain: Not on file  Food Insecurity: No Food Insecurity   Worried About Charity fundraiser in the Last Year: Never true   Ran Out of Food in the Last Year: Never true  Transportation Needs: Not on file  Physical Activity: Not on file  Stress: Not on file  Social Connections: Not on file  Intimate Partner Violence: Not on file   PHYSICAL EXAM  Vitals:   09/01/20 1020  BP: 123/80  Pulse: 88  Weight: 217 lb (98.4 kg)  Height: 5\' 5"  (1.651 m)    Body mass index is 36.11 kg/m.  Generalized: Well developed, in no acute distress   Neurological examination  Mentation: Alert oriented to time, place, history taking. Follows all commands speech and language fluent Cranial nerve II-XII: Pupils were equal round reactive to light. Extraocular movements were full, visual field were full on confrontational test. Facial sensation and strength were normal. Head turning and shoulder shrug  were normal and symmetric. Motor: The motor testing reveals 5 over 5 strength of all 4 extremities. Good symmetric motor tone is noted throughout.  Sensory: Sensory testing is intact to soft touch on all 4 extremities. No evidence of extinction is noted.  Coordination: Cerebellar testing reveals good finger-nose-finger and heel-to-shin bilaterally.  Gait and station: Gait is normal, slight limp on left   DIAGNOSTIC DATA (LABS, IMAGING, TESTING) - I reviewed patient records, labs, notes, testing and imaging myself where available.  Lab Results  Component Value Date   WBC 13.4 (H) 05/24/2020  HGB 12.7  05/24/2020   HCT 39.4 05/24/2020   MCV 91.8 05/24/2020   PLT 238 05/24/2020      Component Value Date/Time   NA 140 05/24/2020 0754   NA 139 07/21/2018 1035   K 3.8 05/24/2020 0754   CL 106 05/24/2020 0754   CO2 24 05/24/2020 0754   GLUCOSE 87 05/24/2020 0754   BUN 8 05/24/2020 0754   BUN 14 07/21/2018 1035   CREATININE 0.89 05/24/2020 0754   CREATININE 0.85 04/15/2015 1512   CALCIUM 9.5 05/24/2020 0754   PROT 6.7 05/17/2020 1536   PROT 6.6 07/21/2018 1035   ALBUMIN 3.8 05/17/2020 1536   ALBUMIN 4.1 07/21/2018 1035   AST 56 (H) 05/17/2020 1536   ALT 71 (H) 05/17/2020 1536   ALKPHOS 129 (H) 05/17/2020 1536   BILITOT 0.4 05/17/2020 1536   BILITOT 0.3 07/21/2018 1035   GFRNONAA >60 05/24/2020 0754   GFRAA >60 07/02/2019 1115   Lab Results  Component Value Date   CHOL 166 07/21/2018   HDL 44 07/21/2018   LDLCALC 95 07/21/2018   TRIG 133 07/21/2018   CHOLHDL 4.2 10/18/2017   Lab Results  Component Value Date   HGBA1C 5.6 07/21/2018   No results found for: VITAMINB12 Lab Results  Component Value Date   TSH 3.670 10/18/2017    ASSESSMENT AND PLAN 60 y.o. year old female  has a past medical history of Arthritis, CHF (congestive heart failure) ([place]), Dysrhythmia, Environmental allergies, Family history of adverse reaction to anesthesia, Fatty liver, Headache(784.0), Hearing loss (2016), History of hiatal hernia, Hot flashes, Joint pain, Lichen sclerosus, Lipoma, Menorrhagia, Migraines, Necrobiosis lipoidica, Osteoarthritis, Overweight, Pelvic pain in female, PONV (postoperative nausea and vomiting), Seasonal allergies, Sinus problem, Sprain (10/25/2010), Trigeminal pulse, Trigger finger, right, and Vitamin D deficiency. here with:  1.  Chronic migraine headache  -Doing great, has had bariatric surgery, lost 60 lbs  -Will remain off Effexor, has done well without it for the last 6 months, if we have to add on another preventative, CGRP may be a good option, is limited on  pill size, doing a lot of chewables -Continue Relpax as needed for acute headache  -Follow-up in 1 year or sooner if needed   Butler Denmark, Laqueta Jean, DNP 09/01/2020, 10:26 AM [facility] [address], [place], [place] [address] [phone]

## 2020-09-01 ENCOUNTER — Ambulatory Visit: Payer: BC Managed Care – PPO | Admitting: Neurology

## 2020-09-01 ENCOUNTER — Other Ambulatory Visit: Payer: Self-pay

## 2020-09-01 ENCOUNTER — Encounter: Payer: Self-pay | Admitting: Neurology

## 2020-09-01 VITALS — BP 123/80 | HR 88 | Ht 65.0 in | Wt 217.0 lb

## 2020-09-01 DIAGNOSIS — G43909 Migraine, unspecified, not intractable, without status migrainosus: Secondary | ICD-10-CM

## 2020-09-01 NOTE — Progress Notes (Signed)
I have read the note, and I agree with the clinical assessment and plan.  Santi Troung K Caitlinn Klinker   

## 2020-09-01 NOTE — Patient Instructions (Signed)
Will stay off Effexor, continue the Relpax as needed  See you back in 1 year

## 2020-09-07 ENCOUNTER — Other Ambulatory Visit: Payer: Self-pay

## 2020-09-07 ENCOUNTER — Ambulatory Visit (INDEPENDENT_AMBULATORY_CARE_PROVIDER_SITE_OTHER): Payer: BC Managed Care – PPO | Admitting: Physical Therapy

## 2020-09-07 DIAGNOSIS — R262 Difficulty in walking, not elsewhere classified: Secondary | ICD-10-CM | POA: Diagnosis not present

## 2020-09-07 DIAGNOSIS — M25662 Stiffness of left knee, not elsewhere classified: Secondary | ICD-10-CM | POA: Diagnosis not present

## 2020-09-07 DIAGNOSIS — G8929 Other chronic pain: Secondary | ICD-10-CM

## 2020-09-07 DIAGNOSIS — M25562 Pain in left knee: Secondary | ICD-10-CM | POA: Diagnosis not present

## 2020-09-07 DIAGNOSIS — M6281 Muscle weakness (generalized): Secondary | ICD-10-CM | POA: Diagnosis not present

## 2020-09-07 DIAGNOSIS — R600 Localized edema: Secondary | ICD-10-CM

## 2020-09-07 NOTE — Therapy (Signed)
[facility] Physical Therapy [address] [place], [place], [address] Phone: [phone]   Fax:  [phone]  Physical Therapy Treatment  Patient Details  Name: Savannah [name] MRN: [id number] Date of Birth: Sep 17, 1960 Referring Provider (PT): Frankey Shown, MD   Encounter Date: 09/07/2020   PT End of Session - 09/07/20 0936     Visit Number 28    Number of Visits 17    Date for PT Re-Evaluation 10/07/20    Progress Note Due on Visit 60    PT Start Time 0845    PT Stop Time 9528    PT Time Calculation (min) 46 min    Activity Tolerance Patient tolerated treatment well    Behavior During Therapy [facility] for tasks assessed/performed             Past Medical History:  Diagnosis Date   Arthritis    bilateral knees, recent LCL sprain-on left knee   CHF (congestive heart failure) ([place])    Dysrhythmia    trigem   Environmental allergies    Family history of adverse reaction to anesthesia    slow to wake up   Fatty liver    Headache(784.0)    migraines-on meds for control, relpax, ketoprophen, vicoden, muscle relaxer   Hearing loss 2016   High frequency hearing - Bilateral    History of hiatal hernia    Hot flashes    Joint pain    Lichen sclerosus    Lipoma    Right Knee   Menorrhagia    Migraines    Necrobiosis lipoidica    Osteoarthritis    Overweight    Pelvic pain in female    PONV (postoperative nausea and vomiting)    ponv, slow to awake   Seasonal allergies    Sinus problem    Sprain 10/25/2010   left knee   Trigeminal pulse    Trigger finger, right    Middle finger and right wrist    Vitamin D deficiency     Past Surgical History:  Procedure Laterality Date   APPENDECTOMY     CHOLECYSTECTOMY     DIAGNOSTIC LAPAROSCOPY  08/2006   DILATION AND CURETTAGE OF UTERUS  2011   attempted ablation   FUNCTIONAL ENDOSCOPIC SINUS SURGERY     ganglion cyst removal     HYSTEROSCOPY     failed   KNEE ARTHROPLASTY Right 2018   meniscus    KNEE CLOSED REDUCTION Left 08/18/2020   Procedure: LEFT KNEE MANIPULATION UNDER ANESTHESIA;  Surgeon: Leandrew Koyanagi, MD;  Location: [facility];  Service: Orthopedics;  Laterality: Left;   LAPAROSCOPIC GASTRIC SLEEVE RESECTION  01/2020   LASIK     LIPOMA EXCISION Right 07/22/2014   Procedure: EXCISION RIGHT THIGH LIPOMA;  Surgeon: Erroll Luna, MD;  Location: [facility];  Service: General;  Laterality: Right;   ROBOTIC ASSISTED TOTAL HYSTERECTOMY  11/06/10   TLH/RSO   SALPINGOOPHORECTOMY  11/06/2010   Procedure: SALPINGO OOPHERECTOMY;  Surgeon: Felipa Emory;  Location: [facility];  Service: Gynecology;  Laterality: Right;   TOTAL KNEE ARTHROPLASTY Left 05/23/2020   Procedure: LEFT TOTAL KNEE ARTHROPLASTY;  Surgeon: Leandrew Koyanagi, MD;  Location: [facility];  Service: Orthopedics;  Laterality: Left;   TRIGGER FINGER RELEASE     UPPER GI ENDOSCOPY N/A 02/02/2020   Procedure: UPPER GI ENDOSCOPY;  Surgeon: [name], MD;  Location: [facility];  Service: General;  Laterality: N/A;    There were no vitals  filed for this visit.   Subjective Assessment - 09/07/20 0930     Subjective Pt states doing pretty well with pain, 3/10 overall in quads and lateral H.S and calf.    Patient is accompained by: Family member    Pertinent History s/p left knee manipulation on 08/18/2020, R knee scope 2018, Bariatric surgery, CHF    Limitations Sitting;House hold activities;Standing;Walking    How long can you sit comfortably? Stiffness with standing after prolonged sitting    How long can you stand comfortably? 15-20 minutes (was 10-15 minutes)    How long can you walk comfortably? 30-45 minutes (was 20-25 minutes)    Patient Stated Goals Walk better without the cane for yard work and return to work    Pain Onset More than a month ago                [facility] PT Assessment - 09/07/20 0001       Assessment   Medical Diagnosis s/p L TKA, s/p manipulation on 08/18/2020    Referring Provider  (PT) Frankey Shown, MD      AROM   Left Knee Extension -4              [facility] Adult PT Treatment/Exercise - 09/07/20 0001       Knee/Hip Exercises: Stretches   Active Hamstring Stretch Left;3 reps;30 seconds    Active Hamstring Stretch Limitations supine with strap    Quad Stretch Left;3 reps;30 seconds    Quad Stretch Limitations supine with strap    Knee: Self-Stretch Limitations tailgate stretch 10 sec X 3 min    Piriformis Stretch Limitations 30s 3x      Knee/Hip Exercises: Aerobic   Recumbent Bike 10 minutes full revolutions      Manual Therapy   Manual therapy comments Lt knee PROM, extensoin mobs, MET contract-relax for hamstring stretching, manual STM with  compression to hamstring and gastrocs              Trigger Point Dry Needling - 09/07/20 0001     Consent Given? Yes    Education Handout Provided Yes    Muscles Treated Lower Quadrant Hamstring;Gastrocnemius    Dry Needling Comments good tolerance    Hamstring Response Twitch response elicited    Gastrocnemius Response Twitch response elicited                    PT Short Term Goals - 08/16/20 1535       PT SHORT TERM GOAL #1   Title Savannah [name] will report independence and compliance with her starter HEP addressing AROM and quadriceps strength impairments.    Status Achieved               PT Long Term Goals - 08/22/20 1410       PT LONG TERM GOAL #1   Title Improve FOTO score to 59.    Status On-going    Target Date 10/07/20      PT LONG TERM GOAL #2   Title Improve L knee pain to consistently 0-3/10 on the Numeric Pain Rating Scale.    Status Achieved      PT LONG TERM GOAL #3   Title Savannah [name] will be able to walk 1/4 mile or more from her car to her office without an assistive device or complaints of functional difficulty.    Baseline Can walk but 1/4 mile without an AD    Status Achieved      PT  LONG TERM GOAL #4   Status On-going    Target Date 10/07/20      PT LONG TERM  GOAL #5   Title Savannah [name] will be independent and compliant with her long-term HEP at DC.    Status Achieved                   Plan - 09/07/20 0937     Clinical Impression Statement Today focused on extension ROM with extensive stretching and manual therapy with addition of TDN to reduce taught bands and trigger points in hamstrings and calf. After stretching and manual she was able to demonstrated only missing 4 deg of Lt TKE which is her best measurment yet.    Personal Factors and Comorbidities Comorbidity 3+    Comorbidities Previous R knee scope in 2018, bariatric surgery, CHF    Examination-Activity Limitations Bathing;Dressing;Sit;Transfers;Bed Mobility;Sleep;Bend;Lift;Squat;Locomotion Level;Stairs;Carry;Stand    Stability/Clinical Decision Making Stable/Uncomplicated    Rehab Potential Good    PT Frequency 3x / week    PT Duration 8 weeks    PT Treatment/Interventions ADLs/Self Care Home Management;Electrical Stimulation;Cryotherapy;Therapeutic activities;Stair training;Gait training;Therapeutic exercise;Balance training;Neuromuscular re-education;Patient/family education;Manual techniques;Vasopneumatic Device    PT Next Visit Plan how was DN? ROM focus flexion and extension, hip/knee strength    PT Home Exercise Plan Access Code: [email or web address]    Consulted and Agree with Plan of Care Patient             Patient will benefit from skilled therapeutic intervention in order to improve the following deficits and impairments:  Abnormal gait, Decreased activity tolerance, Decreased balance, Decreased endurance, Decreased range of motion, Decreased strength, Difficulty walking, Increased edema, Impaired flexibility, Pain, Obesity  Visit Diagnosis: Difficulty walking  Muscle weakness (generalized)  Stiffness of left knee, not elsewhere classified  Chronic pain of left knee  Localized edema     Problem List Patient Active Problem List   Diagnosis Date Noted   Stiffness of  left knee 08/18/2020   Status post total left knee replacement 05/23/2020   Primary osteoarthritis of left knee 05/05/2020   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   Fatty liver disease, nonalcoholic 59/29/2446   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   Trigger finger, right middle finger 10/10/2018   Other insomnia 06/23/2018   Vitamin D deficiency 03/26/2018   Prediabetes 03/11/2018   Obesity 02/26/2018   Migraines 10/01/2016   Decreased cardiac ejection fraction 10/01/2016   Severe obesity ([place]) 10/01/2016   Shingles 08/03/2016   Chronic pain of right knee 05/24/2016   Unilateral primary osteoarthritis, right knee 05/24/2016   Posterior tibial tendinitis, right leg 28/63/8177   Lichen sclerosus 11/65/7903    Savannah [name] 09/07/2020, 9:39 AM  [facility] Physical Therapy [address] [place], [place], [address] Phone: [phone]   Fax:  [phone]  Name: Savannah [name] MRN: [id number] Date of Birth: 1960-04-04

## 2020-09-08 ENCOUNTER — Ambulatory Visit
Admission: RE | Admit: 2020-09-08 | Discharge: 2020-09-08 | Disposition: A | Discharge status: Home / Self Care | Payer: BC Managed Care – PPO | Source: Ambulatory Visit | Attending: Obstetrics & Gynecology | Admitting: Obstetrics & Gynecology

## 2020-09-08 DIAGNOSIS — Z1231 Encounter for screening mammogram for malignant neoplasm of breast: Secondary | ICD-10-CM

## 2020-09-12 ENCOUNTER — Ambulatory Visit (INDEPENDENT_AMBULATORY_CARE_PROVIDER_SITE_OTHER): Payer: BC Managed Care – PPO | Admitting: Physical Therapy

## 2020-09-12 ENCOUNTER — Encounter: Payer: Self-pay | Admitting: Physical Therapy

## 2020-09-12 ENCOUNTER — Other Ambulatory Visit: Payer: Self-pay

## 2020-09-12 DIAGNOSIS — M25562 Pain in left knee: Secondary | ICD-10-CM

## 2020-09-12 DIAGNOSIS — M6281 Muscle weakness (generalized): Secondary | ICD-10-CM

## 2020-09-12 DIAGNOSIS — G8929 Other chronic pain: Secondary | ICD-10-CM

## 2020-09-12 DIAGNOSIS — R262 Difficulty in walking, not elsewhere classified: Secondary | ICD-10-CM | POA: Diagnosis not present

## 2020-09-12 DIAGNOSIS — M25662 Stiffness of left knee, not elsewhere classified: Secondary | ICD-10-CM | POA: Diagnosis not present

## 2020-09-12 DIAGNOSIS — R600 Localized edema: Secondary | ICD-10-CM

## 2020-09-12 NOTE — Therapy (Signed)
[facility] [address] [place], [place], [address] Phone: [phone]   Fax:  [phone]  Physical Therapy Treatment  Patient Details  Name: Savannah [name] MRN: [id number] Date of Birth: 1961/01/23 Referring Provider (PT): Frankey Shown, MD   Encounter Date: 09/12/2020   PT End of Session - 09/12/20 1520     Visit Number 29    Number of Visits 54    Date for PT Re-Evaluation 10/07/20    Progress Note Due on Visit 3    PT Start Time 1515    PT Stop Time 1555    PT Time Calculation (min) 40 min    Activity Tolerance Patient tolerated treatment well    Behavior During Therapy [facility] for tasks assessed/performed             Past Medical History:  Diagnosis Date   Arthritis    bilateral knees, recent LCL sprain-on left knee   CHF (congestive heart failure) ([place])    Dysrhythmia    trigem   Environmental allergies    Family history of adverse reaction to anesthesia    slow to wake up   Fatty liver    Headache(784.0)    migraines-on meds for control, relpax, ketoprophen, vicoden, muscle relaxer   Hearing loss 2016   High frequency hearing - Bilateral    History of hiatal hernia    Hot flashes    Joint pain    Lichen sclerosus    Lipoma    Right Knee   Menorrhagia    Migraines    Necrobiosis lipoidica    Osteoarthritis    Overweight    Pelvic pain in female    PONV (postoperative nausea and vomiting)    ponv, slow to awake   Seasonal allergies    Sinus problem    Sprain 10/25/2010   left knee   Trigeminal pulse    Trigger finger, right    Middle finger and right wrist    Vitamin D deficiency     Past Surgical History:  Procedure Laterality Date   APPENDECTOMY     CHOLECYSTECTOMY     DIAGNOSTIC LAPAROSCOPY  08/2006   DILATION AND CURETTAGE OF UTERUS  2011   attempted ablation   FUNCTIONAL ENDOSCOPIC SINUS SURGERY     ganglion cyst removal     HYSTEROSCOPY     failed   KNEE ARTHROPLASTY Right 2018   meniscus    KNEE CLOSED REDUCTION Left 08/18/2020   Procedure: LEFT KNEE MANIPULATION UNDER ANESTHESIA;  Surgeon: Leandrew Koyanagi, MD;  Location: [place];  Service: Orthopedics;  Laterality: Left;   LAPAROSCOPIC GASTRIC SLEEVE RESECTION  01/2020   LASIK     LIPOMA EXCISION Right 07/22/2014   Procedure: EXCISION RIGHT THIGH LIPOMA;  Surgeon: Erroll Luna, MD;  Location: [place];  Service: General;  Laterality: Right;   ROBOTIC ASSISTED TOTAL HYSTERECTOMY  11/06/10   TLH/RSO   SALPINGOOPHORECTOMY  11/06/2010   Procedure: SALPINGO OOPHERECTOMY;  Surgeon: Felipa Emory;  Location: [facility];  Service: Gynecology;  Laterality: Right;   TOTAL KNEE ARTHROPLASTY Left 05/23/2020   Procedure: LEFT TOTAL KNEE ARTHROPLASTY;  Surgeon: Leandrew Koyanagi, MD;  Location: [place];  Service: Orthopedics;  Laterality: Left;   TRIGGER FINGER RELEASE     UPPER GI ENDOSCOPY N/A 02/02/2020   Procedure: UPPER GI ENDOSCOPY;  Surgeon: [name], MD;  Location: [facility];  Service: General;  Laterality: N/A;    There were no vitals  filed for this visit.   Subjective Assessment - 09/12/20 1514     Subjective Pt states she received a massage last week and was feeling great so she decided to get down on her knees and clean her baseboards. Pt feels she may have over did it and her knee pain today is 2-3/10 with increased stiffness.    Patient is accompained by: Family member    Pertinent History s/p left knee manipulation on 08/18/2020, R knee scope 2018, Bariatric surgery, CHF    Limitations Sitting;House hold activities;Standing;Walking    How long can you sit comfortably? Stiffness with standing after prolonged sitting    How long can you stand comfortably? 15-20 minutes (was 10-15 minutes)    How long can you walk comfortably? 30-45 minutes (was 20-25 minutes)    Patient Stated Goals Walk better without the cane for yard work and return to work    Currently in Pain? Yes    Pain Score 3     Pain Location  Knee    Pain Orientation Left    Pain Descriptors / Indicators Sore;Tightness    Pain Type Surgical pain    Pain Onset More than a month ago                [facility] PT Assessment - 09/12/20 0001       Assessment   Medical Diagnosis s/p L TKA, s/p manipulation on 08/18/2020    Referring Provider (PT) Xu, Naiping, MD      AROM   Left Knee Extension -12    Left Knee Flexion 105      PROM   Left Knee Extension -8    Left Knee Flexion 108                           [facility] Adult PT Treatment/Exercise - 09/12/20 0001       Knee/Hip Exercises: Stretches   Active Hamstring Stretch Left;3 reps;30 seconds      Knee/Hip Exercises: Aerobic   Recumbent Bike 10 minutes full revolutions      Knee/Hip Exercises: Machines for Strengthening   Total Gym Leg Press 125# 2x15, Left LE Only: 75# 2x15      Manual Therapy   Manual therapy comments Lt knee PROM, extensoin mobs, MET contract-relax for hamstring stretching, manual STM with  compression to quads, extension stretch with over pressures              Trigger Point Dry Needling - 09/12/20 1539     Consent Given? Yes    Education Handout Provided Previously provided    Muscles Treated Lower Quadrant Vastus lateralis;Vastus medialis;Vastus intermedius;Rectus femoris    Rectus femoris Response Twitch response elicited    Vastus lateralis Response Twitch response elicited    Vastus medialis Response Twitch response elicited    Vastus intermedius Response Twitch response elicited                    PT Short Term Goals - 08/16/20 1535       PT SHORT TERM GOAL #1   Title [name] will report independence and compliance with her starter HEP addressing AROM and quadriceps strength impairments.    Status Achieved               PT Long Term Goals - 09/12/20 1530       PT LONG TERM GOAL #1   Title Improve FOTO score to 59.      Status On-going      PT LONG TERM GOAL #2   Title Improve L knee pain  to consistently 0-3/10 on the Numeric Pain Rating Scale.    Status On-going      PT LONG TERM GOAL #3   Title [name] will be able to walk 1/4 mile or more from her car to her office without an assistive device or complaints of functional difficulty.    Status Achieved      PT LONG TERM GOAL #4   Title Improve L knee AROM for extension to -3 degrees or better and flexion to 110 degrees or better by DC.    Status On-going      PT LONG TERM GOAL #5   Title [name] will be independent and compliant with her long-term HEP at DC.    Status Achieved                   Plan - 09/12/20 1520     Clinical Impression Statement Pt reporting increased stiffness/tightness today after stating, "I think I over did it over the weekend". TP DN to pt's quads perormed by Faustino Congress, PT, DPT. Pt is scheduled for a massage today following PT visit. Continue skilled PT to maximize pt's function.    Personal Factors and Comorbidities Comorbidity 3+    Comorbidities Previous R knee scope in 2018, bariatric surgery, CHF    Examination-Activity Limitations Bathing;Dressing;Sit;Transfers;Bed Mobility;Sleep;Bend;Lift;Squat;Locomotion Level;Stairs;Carry;Stand    Examination-Participation Restrictions Interpersonal Relationship;Occupation;Yard Work;Community Activity    Stability/Clinical Decision Making Stable/Uncomplicated    Rehab Potential Good    PT Frequency 3x / week    PT Duration 8 weeks    PT Treatment/Interventions ADLs/Self Care Home Management;Electrical Stimulation;Cryotherapy;Therapeutic activities;Stair training;Gait training;Therapeutic exercise;Balance training;Neuromuscular re-education;Patient/family education;Manual techniques;Vasopneumatic Device    PT Next Visit Plan assess response to DN quads, ROM focus flexion and extension, hip/knee strength    PT Home Exercise Plan Access Code: [email or web address]    Consulted and Agree with Plan of Care Patient             Patient will benefit  from skilled therapeutic intervention in order to improve the following deficits and impairments:  Abnormal gait, Decreased activity tolerance, Decreased balance, Decreased endurance, Decreased range of motion, Decreased strength, Difficulty walking, Increased edema, Impaired flexibility, Pain, Obesity  Visit Diagnosis: Difficulty walking  Muscle weakness (generalized)  Stiffness of left knee, not elsewhere classified  Chronic pain of left knee  Localized edema     Problem List Patient Active Problem List   Diagnosis Date Noted   Stiffness of left knee 08/18/2020   Status post total left knee replacement 05/23/2020   Primary osteoarthritis of left knee 05/05/2020   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   Fatty liver disease, nonalcoholic 31/49/7026   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   Trigger finger, right middle finger 10/10/2018   Other insomnia 06/23/2018   Vitamin D deficiency 03/26/2018   Prediabetes 03/11/2018   Obesity 02/26/2018   Migraines 10/01/2016   Decreased cardiac ejection fraction 10/01/2016   Severe obesity ([place]) 10/01/2016   Shingles 08/03/2016   Chronic pain of right knee 05/24/2016   Unilateral primary osteoarthritis, right knee 05/24/2016   Posterior tibial tendinitis, right leg 37/85/8850   Lichen sclerosus 27/74/1287    Oretha Caprice, PT, MPT 09/12/2020, 4:09 PM  Laureen Abrahams, PT, DPT 09/12/20 4:09 PM    [facility] [address] [place], [place], [address] Phone: [phone]   Fax:  [phone]  Name: Gini L Breck MRN: [id number] Date of Birth: 05/10/1960    

## 2020-09-14 ENCOUNTER — Ambulatory Visit (INDEPENDENT_AMBULATORY_CARE_PROVIDER_SITE_OTHER): Payer: BC Managed Care – PPO | Admitting: Physical Therapy

## 2020-09-14 ENCOUNTER — Other Ambulatory Visit: Payer: Self-pay

## 2020-09-14 DIAGNOSIS — R600 Localized edema: Secondary | ICD-10-CM

## 2020-09-14 DIAGNOSIS — M25662 Stiffness of left knee, not elsewhere classified: Secondary | ICD-10-CM

## 2020-09-14 DIAGNOSIS — M25562 Pain in left knee: Secondary | ICD-10-CM

## 2020-09-14 DIAGNOSIS — M6281 Muscle weakness (generalized): Secondary | ICD-10-CM

## 2020-09-14 DIAGNOSIS — G8929 Other chronic pain: Secondary | ICD-10-CM

## 2020-09-14 DIAGNOSIS — R262 Difficulty in walking, not elsewhere classified: Secondary | ICD-10-CM

## 2020-09-14 NOTE — Therapy (Signed)
[facility] Therapy [address] [place], [place], [address] Phone: [phone]   Fax:  [phone]  Physical Therapy Treatment  Patient Details  Name: Savannah [name] MRN: [id number] Date of Birth: [birth year] Referring Provider (PT): Frankey Shown, MD   Encounter Date: 09/14/2020   PT End of Session - 09/14/20 1623     Visit Number 30    Number of Visits 82    Date for PT Re-Evaluation 10/07/20    Progress Note Due on Visit 69    PT Start Time 1555    PT Stop Time 1640    PT Time Calculation (min) 45 min    Activity Tolerance Patient tolerated treatment well    Behavior During Therapy [facility] for tasks assessed/performed             Past Medical History:  Diagnosis Date   Arthritis    bilateral knees, recent LCL sprain-on left knee   CHF (congestive heart failure) ([place])    Dysrhythmia    trigem   Environmental allergies    Family history of adverse reaction to anesthesia    slow to wake up   Fatty liver    Headache(784.0)    migraines-on meds for control, relpax, ketoprophen, vicoden, muscle relaxer   Hearing loss 2016   High frequency hearing - Bilateral    History of hiatal hernia    Hot flashes    Joint pain    Lichen sclerosus    Lipoma    Right Knee   Menorrhagia    Migraines    Necrobiosis lipoidica    Osteoarthritis    Overweight    Pelvic pain in female    PONV (postoperative nausea and vomiting)    ponv, slow to awake   Seasonal allergies    Sinus problem    Sprain 10/25/2010   left knee   Trigeminal pulse    Trigger finger, right    Middle finger and right wrist    Vitamin D deficiency     Past Surgical History:  Procedure Laterality Date   APPENDECTOMY     CHOLECYSTECTOMY     DIAGNOSTIC LAPAROSCOPY  08/2006   DILATION AND CURETTAGE OF UTERUS  2011   attempted ablation   FUNCTIONAL ENDOSCOPIC SINUS SURGERY     ganglion cyst removal     HYSTEROSCOPY     failed   KNEE ARTHROPLASTY Right 2018   meniscus    KNEE CLOSED REDUCTION Left 08/18/2020   Procedure: LEFT KNEE MANIPULATION UNDER ANESTHESIA;  Surgeon: Leandrew Koyanagi, MD;  Location: [place];  Service: Orthopedics;  Laterality: Left;   LAPAROSCOPIC GASTRIC SLEEVE RESECTION  01/2020   LASIK     LIPOMA EXCISION Right 07/22/2014   Procedure: EXCISION RIGHT THIGH LIPOMA;  Surgeon: Erroll Luna, MD;  Location: [place];  Service: General;  Laterality: Right;   ROBOTIC ASSISTED TOTAL HYSTERECTOMY  11/06/10   TLH/RSO   SALPINGOOPHORECTOMY  11/06/2010   Procedure: SALPINGO OOPHERECTOMY;  Surgeon: Felipa Emory;  Location: [facility];  Service: Gynecology;  Laterality: Right;   TOTAL KNEE ARTHROPLASTY Left 05/23/2020   Procedure: LEFT TOTAL KNEE ARTHROPLASTY;  Surgeon: Leandrew Koyanagi, MD;  Location: [place];  Service: Orthopedics;  Laterality: Left;   TRIGGER FINGER RELEASE     UPPER GI ENDOSCOPY N/A 02/02/2020   Procedure: UPPER GI ENDOSCOPY;  Surgeon: [name], MD;  Location: [facility];  Service: General;  Laterality: N/A;    There were no vitals  filed for this visit.   Subjective Assessment - 09/14/20 1555     Subjective relays stiffness and also some sorness and bruising from massage but other than that not too much pain today in her knee                [facility] Adult PT Treatment/Exercise - 09/14/20 0001       Knee/Hip Exercises: Stretches   Active Hamstring Stretch Left;3 reps;30 seconds    Quad Stretch Left;3 reps;30 seconds    Quad Stretch Limitations supine with strap    Gastroc Stretch Both;30 seconds;2 reps      Knee/Hip Exercises: Aerobic   Recumbent Bike 5 min      Knee/Hip Exercises: Machines for Strengthening   Total Gym Leg Press 125# 2x15, Left LE Only: 75# 2x15      Manual Therapy   Manual therapy comments Lt knee PROM, extension mobs, compression to quads, hamstings, gastroc extension stretch with over pressures              Trigger Point Dry Needling - 09/14/20 0001      Consent Given? Yes    Education Handout Provided Previously provided    Muscles Treated Lower Quadrant Quadriceps;Hamstring;Gastrocnemius;Anterior tibialis    Other Dry Needling good tolerance noted    Anterior tibialis Response Twitch response elicited    Gastrocnemius Response Twitch response elicited                    PT Short Term Goals - 08/16/20 1535       PT SHORT TERM GOAL #1   Title Savannah [name] will report independence and compliance with her starter HEP addressing AROM and quadriceps strength impairments.    Status Achieved               PT Long Term Goals - 09/12/20 1530       PT LONG TERM GOAL #1   Title Improve FOTO score to 59.    Status On-going      PT LONG TERM GOAL #2   Title Improve L knee pain to consistently 0-3/10 on the Numeric Pain Rating Scale.    Status On-going      PT LONG TERM GOAL #3   Title Savannah [name] will be able to walk 1/4 mile or more from her car to her office without an assistive device or complaints of functional difficulty.    Status Achieved      PT LONG TERM GOAL #4   Title Improve L knee AROM for extension to -3 degrees or better and flexion to 110 degrees or better by DC.    Status On-going      PT LONG TERM GOAL #5   Title Savannah [name] will be independent and compliant with her long-term HEP at DC.    Status Achieved                   Plan - 09/14/20 1634     Clinical Impression Statement Continued with DN, stretching, PROM and knee mobs today to maximize ROM available. ROM will continue to be our focus as this is her biggest deficit.    Personal Factors and Comorbidities Comorbidity 3+    Comorbidities Previous R knee scope in 2018, bariatric surgery, CHF    Examination-Activity Limitations Bathing;Dressing;Sit;Transfers;Bed Mobility;Sleep;Bend;Lift;Squat;Locomotion Level;Stairs;Carry;Stand    Examination-Participation Restrictions Interpersonal Relationship;Occupation;Yard Work;Community Activity     Stability/Clinical Decision Making Stable/Uncomplicated    Rehab Potential Good    PT Frequency 3x /  week    PT Duration 8 weeks    PT Treatment/Interventions ADLs/Self Care Home Management;Electrical Stimulation;Cryotherapy;Therapeutic activities;Stair training;Gait training;Therapeutic exercise;Balance training;Neuromuscular re-education;Patient/family education;Manual techniques;Vasopneumatic Device    PT Next Visit Plan assess response to DN quads, ROM focus flexion and extension, hip/knee strength    PT Home Exercise Plan Access Code: [email or web address]    Consulted and Agree with Plan of Care Patient             Patient will benefit from skilled therapeutic intervention in order to improve the following deficits and impairments:  Abnormal gait, Decreased activity tolerance, Decreased balance, Decreased endurance, Decreased range of motion, Decreased strength, Difficulty walking, Increased edema, Impaired flexibility, Pain, Obesity  Visit Diagnosis: Difficulty walking  Muscle weakness (generalized)  Stiffness of left knee, not elsewhere classified  Chronic pain of left knee  Localized edema     Problem List Patient Active Problem List   Diagnosis Date Noted   Stiffness of left knee 08/18/2020   Status post total left knee replacement 05/23/2020   Primary osteoarthritis of left knee 05/05/2020   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   Fatty liver disease, nonalcoholic [id number]   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   Trigger finger, right middle finger 10/10/2018   Other insomnia 06/23/2018   Vitamin D deficiency 03/26/2018   Prediabetes 03/11/2018   Obesity 02/26/2018   Migraines 10/01/2016   Decreased cardiac ejection fraction 10/01/2016   Severe obesity ([place]) 10/01/2016   Shingles 08/03/2016   Chronic pain of right knee 05/24/2016   Unilateral primary osteoarthritis, right knee 05/24/2016   Posterior tibial tendinitis, right leg [id number]    Lichen sclerosus [id number]    Savannah [name] 09/14/2020, 4:40 PM  [facility] [address] [place], [place], [address] Phone: [phone]   Fax:  [phone]  Name: Savannah [name] MRN: [id number] Date of Birth: 11-Mar-1960

## 2020-09-19 ENCOUNTER — Other Ambulatory Visit: Payer: Self-pay

## 2020-09-19 ENCOUNTER — Encounter: Payer: BC Managed Care – PPO | Attending: General Surgery | Admitting: Skilled Nursing Facility1

## 2020-09-19 NOTE — Progress Notes (Signed)
  Medical Nutrition Therapy  Primary concerns today: Post-operative Bariatric Surgery Nutrition Management  Anthropometrics  Surgery date: 02/02/2020 Surgery type: Sleeve Start weight at [facility]: 280 lb Weight today: 216.3 pounds   Body Composition Scale 02/16/2020 03/30/2020 09/19/2020  Current Body Weight 258.1 243.8 216.3  Total Body Fat % 47 45.7 42.9  Visceral Fat '19 17 14  '$ Fat-Free Mass % 52.9 54.2 57   Total Body Water % 40.9 41.6 43  Muscle-Mass lbs 30.6 30.5 30.1  BMI 42.6 40.2 35.7  Body Fat Displacement            Torso  lbs 75.2 69.1 57.4         Left Leg  lbs 15 13.8 11.4         Right Leg  lbs 15 13.8 11.4         Left Arm  lbs 7.5 6.9 5.7         Right Arm   lbs 7.5 6.9 5.7    Pt states she had an outpt procedure for her knee. Pt states she is worried she will need another knee replacement on the same knee and is currently doing PT. Pt states due to this she has not been as active as she would like.  Pt states she bought a bag of jelly beans which lasts her about a month and helps to satisfy her sweet tooth.  Pt states she really really likes a lot of butter on her potatoes so she avoids potatoes. Pt state she has eaten a sandwich a few times. Pt sates she has eaten a whole sandwich but typically 3/4 of one will satisfy her.    24 hr recall:  First meal: yogurt Snack: Second meal: salad + steak Snack: a couple jelly beans Third meal: hamburger + veggies Snack:   Fluid intake: adequate   Medications: See List Supplementation: appropriate   Using straws: no Drinking while eating: no Having you been chewing well: yes Chewing/swallowing difficulties: no Changes in vision: no Changes to mood/headaches: no Hair loss/C[place]es to skin/Changes to nails: no Any difficulty focusing or concentrating: no Sweating: no Dizziness/Lightheaded: no Palpitations: no  Carbonated beverages: no N/V/D/C/GAS: no Abdominal Pain: no Dumping syndrome: no  Recent physical  activity:  ADL's due to knee pain   Progress Towards Goal(s):  In Progress Teaching method utilized: Visual & Auditory  Demonstrated degree of understanding via: Teach Back  Readiness Level: Action Barriers to learning/adherence to lifestyle change: none identified  Goals:  -limit to 2 servings of fruit -pick up some new hobbies for sitting to help avoid overeating with lack of activity/movement   Teaching Method Utilized:  Visual Auditory Hands on  Demonstrated degree of understanding via:  Teach Back   Monitoring/Evaluation:  Dietary intake, exercise, and body weight. Follow up in October

## 2020-09-20 ENCOUNTER — Ambulatory Visit (INDEPENDENT_AMBULATORY_CARE_PROVIDER_SITE_OTHER): Payer: BC Managed Care – PPO | Admitting: Physical Therapy

## 2020-09-20 DIAGNOSIS — M25662 Stiffness of left knee, not elsewhere classified: Secondary | ICD-10-CM | POA: Diagnosis not present

## 2020-09-20 DIAGNOSIS — M6281 Muscle weakness (generalized): Secondary | ICD-10-CM

## 2020-09-20 DIAGNOSIS — M25562 Pain in left knee: Secondary | ICD-10-CM | POA: Diagnosis not present

## 2020-09-20 DIAGNOSIS — G8929 Other chronic pain: Secondary | ICD-10-CM

## 2020-09-20 DIAGNOSIS — R262 Difficulty in walking, not elsewhere classified: Secondary | ICD-10-CM | POA: Diagnosis not present

## 2020-09-20 DIAGNOSIS — R600 Localized edema: Secondary | ICD-10-CM

## 2020-09-20 NOTE — Therapy (Signed)
[facility] Physical Therapy [address] [place], [place], [address] Phone: [phone]   Fax:  [phone]  Physical Therapy Treatment  Patient Details  Name: Savannah [name] MRN: [id number] Date of Birth: [birth year] Referring Provider (PT): Frankey Shown, MD   Encounter Date: 09/20/2020   PT End of Session - 09/20/20 1523     Visit Number 31    Number of Visits 97    Date for PT Re-Evaluation 10/07/20    Progress Note Due on Visit 31    PT Start Time 1503    PT Stop Time [id number]    PT Time Calculation (min) 44 min    Activity Tolerance Patient tolerated treatment well    Behavior During Therapy [facility] for tasks assessed/performed             Past Medical History:  Diagnosis Date   Arthritis    bilateral knees, recent LCL sprain-on left knee   CHF (congestive heart failure) ([place])    Dysrhythmia    trigem   Environmental allergies    Family history of adverse reaction to anesthesia    slow to wake up   Fatty liver    Headache(784.0)    migraines-on meds for control, relpax, ketoprophen, vicoden, muscle relaxer   Hearing loss 2016   High frequency hearing - Bilateral    History of hiatal hernia    Hot flashes    Joint pain    Lichen sclerosus    Lipoma    Right Knee   Menorrhagia    Migraines    Necrobiosis lipoidica    Osteoarthritis    Overweight    Pelvic pain in female    PONV (postoperative nausea and vomiting)    ponv, slow to awake   Seasonal allergies    Sinus problem    Sprain 10/25/2010   left knee   Trigeminal pulse    Trigger finger, right    Middle finger and right wrist    Vitamin D deficiency     Past Surgical History:  Procedure Laterality Date   APPENDECTOMY     CHOLECYSTECTOMY     DIAGNOSTIC LAPAROSCOPY  08/2006   DILATION AND CURETTAGE OF UTERUS  2011   attempted ablation   FUNCTIONAL ENDOSCOPIC SINUS SURGERY     ganglion cyst removal     HYSTEROSCOPY     failed   KNEE ARTHROPLASTY Right 2018   meniscus    KNEE CLOSED REDUCTION Left 08/18/2020   Procedure: LEFT KNEE MANIPULATION UNDER ANESTHESIA;  Surgeon: Leandrew Koyanagi, MD;  Location: [place];  Service: Orthopedics;  Laterality: Left;   LAPAROSCOPIC GASTRIC SLEEVE RESECTION  01/2020   LASIK     LIPOMA EXCISION Right 07/22/2014   Procedure: EXCISION RIGHT THIGH LIPOMA;  Surgeon: Erroll Luna, MD;  Location: [place];  Service: General;  Laterality: Right;   ROBOTIC ASSISTED TOTAL HYSTERECTOMY  11/06/10   TLH/RSO   SALPINGOOPHORECTOMY  11/06/2010   Procedure: SALPINGO OOPHERECTOMY;  Surgeon: Felipa Emory;  Location: [facility];  Service: Gynecology;  Laterality: Right;   TOTAL KNEE ARTHROPLASTY Left 05/23/2020   Procedure: LEFT TOTAL KNEE ARTHROPLASTY;  Surgeon: Leandrew Koyanagi, MD;  Location: [place];  Service: Orthopedics;  Laterality: Left;   TRIGGER FINGER RELEASE     UPPER GI ENDOSCOPY N/A 02/02/2020   Procedure: UPPER GI ENDOSCOPY;  Surgeon: [name], MD;  Location: [facility];  Service: General;  Laterality: N/A;    There were no vitals  filed for this visit.   Subjective Assessment - 09/20/20 1522     Subjective relays stiffness is her biggest complaint, not having much pain    Patient is accompained by: Family member    Pertinent History s/p left knee manipulation on 08/18/2020, R knee scope 2018, Bariatric surgery, CHF    Limitations Sitting;House hold activities;Standing;Walking    How long can you sit comfortably? Stiffness with standing after prolonged sitting    How long can you stand comfortably? 15-20 minutes (was 10-15 minutes)    How long can you walk comfortably? 30-45 minutes (was 20-25 minutes)    Patient Stated Goals Walk better without the cane for yard work and return to work    Pain Onset More than a month ago                [facility] PT Assessment - 09/20/20 0001       Assessment   Medical Diagnosis s/p L TKA, s/p manipulation on 08/18/2020    Referring Provider (PT) Frankey Shown, MD       AROM   Left Knee Flexion 105      PROM   Left Knee Flexion 112                           [facility] Adult PT Treatment/Exercise - 09/20/20 0001       Knee/Hip Exercises: Stretches   Sports administrator Left;3 reps;60 seconds    Quad Stretch Limitations prone with strap    Gastroc Stretch Both;3 reps;30 seconds    Gastroc Stretch Limitations slantboard    Other Knee/Hip Stretches supine heel slide with foot on pball and using strap 10 sec X10      Knee/Hip Exercises: Aerobic   Recumbent Bike 5 min      Knee/Hip Exercises: Machines for Strengthening   Total Gym Leg Press 131# DL 3X10, Left LE Only: 81# 2X10      Knee/Hip Exercises: Standing   Other Standing Knee Exercises TRX squats 2X10 holding 2-3 seconds      Manual Therapy   Manual therapy comments Lt knee PROM, extension and flexon mobs, compression and palpation to quads with DN              Trigger Point Dry Needling - 09/20/20 0001     Muscles Treated Lower Quadrant Quadriceps    Quadriceps Response Twitch response elicited                    PT Short Term Goals - 08/16/20 1535       PT SHORT TERM GOAL #1   Title Savannah [name] will report independence and compliance with her starter HEP addressing AROM and quadriceps strength impairments.    Status Achieved               PT Long Term Goals - 09/12/20 1530       PT LONG TERM GOAL #1   Title Improve FOTO score to 59.    Status On-going      PT LONG TERM GOAL #2   Title Improve L knee pain to consistently 0-3/10 on the Numeric Pain Rating Scale.    Status On-going      PT LONG TERM GOAL #3   Title Savannah [name] will be able to walk 1/4 mile or more from her car to her office without an assistive device or complaints of functional difficulty.    Status Achieved  PT LONG TERM GOAL #4   Title Improve L knee AROM for extension to -3 degrees or better and flexion to 110 degrees or better by DC.    Status On-going      PT LONG TERM GOAL  #5   Title Savannah [name] will be independent and compliant with her long-term HEP at DC.    Status Achieved                   Plan - 09/20/20 1539     Clinical Impression Statement She did show some improvements in knee flexion PROM today. We continued to work on aggressive stretching focus and DN to improve her overall ROM as this continues to be her biggest functional deficit.    Personal Factors and Comorbidities Comorbidity 3+    Comorbidities Previous R knee scope in 2018, bariatric surgery, CHF    Examination-Activity Limitations Bathing;Dressing;Sit;Transfers;Bed Mobility;Sleep;Bend;Lift;Squat;Locomotion Level;Stairs;Carry;Stand    Examination-Participation Restrictions Interpersonal Relationship;Occupation;Yard Work;Community Activity    Stability/Clinical Decision Making Stable/Uncomplicated    Rehab Potential Good    PT Frequency 3x / week    PT Duration 8 weeks    PT Treatment/Interventions ADLs/Self Care Home Management;Electrical Stimulation;Cryotherapy;Therapeutic activities;Stair training;Gait training;Therapeutic exercise;Balance training;Neuromuscular re-education;Patient/family education;Manual techniques;Vasopneumatic Device    PT Next Visit Plan assess response to DN quads, ROM focus flexion and extension, hip/knee strength    PT Home Exercise Plan Access Code: [email or web address]    Consulted and Agree with Plan of Care Patient             Patient will benefit from skilled therapeutic intervention in order to improve the following deficits and impairments:  Abnormal gait, Decreased activity tolerance, Decreased balance, Decreased endurance, Decreased range of motion, Decreased strength, Difficulty walking, Increased edema, Impaired flexibility, Pain, Obesity  Visit Diagnosis: Difficulty walking  Muscle weakness (generalized)  Stiffness of left knee, not elsewhere classified  Chronic pain of left knee  Localized edema     Problem List Patient Active Problem List    Diagnosis Date Noted   Stiffness of left knee 08/18/2020   Status post total left knee replacement 05/23/2020   Primary osteoarthritis of left knee 05/05/2020   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   Fatty liver disease, nonalcoholic [id number]   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   Trigger finger, right middle finger 10/10/2018   Other insomnia 06/23/2018   Vitamin D deficiency 03/26/2018   Prediabetes 03/11/2018   Obesity 02/26/2018   Migraines 10/01/2016   Decreased cardiac ejection fraction 10/01/2016   Severe obesity ([place]) 10/01/2016   Shingles 08/03/2016   Chronic pain of right knee 05/24/2016   Unilateral primary osteoarthritis, right knee 05/24/2016   Posterior tibial tendinitis, right leg [id number]   Lichen sclerosus [id number]    Silvestre Mesi 09/20/2020, 3:48 PM  [facility] Physical Therapy [address] [place], [place], [address] Phone: [phone]   Fax:  [phone]  Name: Savannah [name] MRN: [id number] Date of Birth: 1961-01-22

## 2020-09-22 ENCOUNTER — Telehealth: Payer: Self-pay | Admitting: Orthopaedic Surgery

## 2020-09-22 ENCOUNTER — Other Ambulatory Visit: Payer: Self-pay

## 2020-09-22 ENCOUNTER — Ambulatory Visit (INDEPENDENT_AMBULATORY_CARE_PROVIDER_SITE_OTHER): Payer: BC Managed Care – PPO | Admitting: Physical Therapy

## 2020-09-22 ENCOUNTER — Ambulatory Visit (INDEPENDENT_AMBULATORY_CARE_PROVIDER_SITE_OTHER): Payer: BC Managed Care – PPO | Admitting: Orthopaedic Surgery

## 2020-09-22 DIAGNOSIS — M6281 Muscle weakness (generalized): Secondary | ICD-10-CM

## 2020-09-22 DIAGNOSIS — Z96652 Presence of left artificial knee joint: Secondary | ICD-10-CM

## 2020-09-22 DIAGNOSIS — G8929 Other chronic pain: Secondary | ICD-10-CM

## 2020-09-22 DIAGNOSIS — R262 Difficulty in walking, not elsewhere classified: Secondary | ICD-10-CM | POA: Diagnosis not present

## 2020-09-22 DIAGNOSIS — M25662 Stiffness of left knee, not elsewhere classified: Secondary | ICD-10-CM

## 2020-09-22 DIAGNOSIS — M25562 Pain in left knee: Secondary | ICD-10-CM

## 2020-09-22 MED ORDER — TRAMADOL HCL 50 MG PO TABS: 50.0000 mg | ORAL_TABLET | Freq: Three times a day (TID) | ORAL | 2 refills | Status: DC | PRN

## 2020-09-22 MED ORDER — TIZANIDINE HCL 4 MG PO TABS
4.0000 mg | ORAL_TABLET | Freq: Three times a day (TID) | ORAL | 0 refills | Status: DC | PRN
Start: 2020-09-22 — End: 2020-11-03

## 2020-09-22 NOTE — Therapy (Signed)
[facility] Physical Therapy [address] [place], [place], [address] Phone: [phone]   Fax:  [phone]  Physical Therapy Treatment  Patient Details  Name: MELISSIE KEATLEY MRN: [id number] Date of Birth: Jun 22, 1960 Referring Provider (PT): Frankey Shown, MD   Encounter Date: 09/22/2020   PT End of Session - 09/22/20 1525     Visit Number 32    Number of Visits 58    Date for PT Re-Evaluation 10/07/20    Progress Note Due on Visit 75    PT Start Time 1430    PT Stop Time 1518    PT Time Calculation (min) 48 min    Activity Tolerance Patient tolerated treatment well    Behavior During Therapy [facility] for tasks assessed/performed             Past Medical History:  Diagnosis Date   Arthritis    bilateral knees, recent LCL sprain-on left knee   CHF (congestive heart failure) ([place])    Dysrhythmia    trigem   Environmental allergies    Family history of adverse reaction to anesthesia    slow to wake up   Fatty liver    Headache(784.0)    migraines-on meds for control, relpax, ketoprophen, vicoden, muscle relaxer   Hearing loss 2016   High frequency hearing - Bilateral    History of hiatal hernia    Hot flashes    Joint pain    Lichen sclerosus    Lipoma    Right Knee   Menorrhagia    Migraines    Necrobiosis lipoidica    Osteoarthritis    Overweight    Pelvic pain in female    PONV (postoperative nausea and vomiting)    ponv, slow to awake   Seasonal allergies    Sinus problem    Sprain 10/25/2010   left knee   Trigeminal pulse    Trigger finger, right    Middle finger and right wrist    Vitamin D deficiency     Past Surgical History:  Procedure Laterality Date   APPENDECTOMY     CHOLECYSTECTOMY     DIAGNOSTIC LAPAROSCOPY  08/2006   DILATION AND CURETTAGE OF UTERUS  2011   attempted ablation   FUNCTIONAL ENDOSCOPIC SINUS SURGERY     ganglion cyst removal     HYSTEROSCOPY     failed   KNEE ARTHROPLASTY Right 2018   meniscus    KNEE CLOSED REDUCTION Left 08/18/2020   Procedure: LEFT KNEE MANIPULATION UNDER ANESTHESIA;  Surgeon: Leandrew Koyanagi, MD;  Location: [place];  Service: Orthopedics;  Laterality: Left;   LAPAROSCOPIC GASTRIC SLEEVE RESECTION  01/2020   LASIK     LIPOMA EXCISION Right 07/22/2014   Procedure: EXCISION RIGHT THIGH LIPOMA;  Surgeon: Erroll Luna, MD;  Location: [place];  Service: General;  Laterality: Right;   ROBOTIC ASSISTED TOTAL HYSTERECTOMY  11/06/10   TLH/RSO   SALPINGOOPHORECTOMY  11/06/2010   Procedure: SALPINGO OOPHERECTOMY;  Surgeon: Felipa Emory;  Location: [facility];  Service: Gynecology;  Laterality: Right;   TOTAL KNEE ARTHROPLASTY Left 05/23/2020   Procedure: LEFT TOTAL KNEE ARTHROPLASTY;  Surgeon: Leandrew Koyanagi, MD;  Location: [place];  Service: Orthopedics;  Laterality: Left;   TRIGGER FINGER RELEASE     UPPER GI ENDOSCOPY N/A 02/02/2020   Procedure: UPPER GI ENDOSCOPY;  Surgeon: [name], MD;  Location: [facility];  Service: General;  Laterality: N/A;    There were no vitals  filed for this visit.   Subjective Assessment - 09/22/20 1524     Subjective relays not much pain, but mild pain lateral knee, she had Follow up with MD who wants her to continue PT for 2 times per week for another 6 weeks with focus on stretching    Patient is accompained by: Family member    Pertinent History s/p left knee manipulation on 08/18/2020, R knee scope 2018, Bariatric surgery, CHF    Limitations Sitting;House hold activities;Standing;Walking    How long can you sit comfortably? Stiffness with standing after prolonged sitting    How long can you stand comfortably? 15-20 minutes (was 10-15 minutes)    How long can you walk comfortably? 30-45 minutes (was 20-25 minutes)    Patient Stated Goals Walk better without the cane for yard work and return to work    Pain Onset More than a month ago                [facility] PT Assessment - 09/22/20 0001       Assessment    Medical Diagnosis s/p L TKA, s/p manipulation on 08/18/2020    Referring Provider (PT) Frankey Shown, MD      Observation/Other Assessments   Focus on Therapeutic Outcomes (FOTO)  55%, goal is 59%                    [facility] Adult PT Treatment/Exercise - 09/22/20 0001       Knee/Hip Exercises: Stretches   Active Hamstring Stretch Left;3 reps;30 seconds    Active Hamstring Stretch Limitations standing with propped on treadmill    Gastroc Stretch Both;3 reps;30 seconds    Gastroc Stretch Limitations slantboard    Other Knee/Hip Stretches seated knee extension stretch  in chair with foot propped in other chair 5 min with 7.5#      Knee/Hip Exercises: Aerobic   Nustep L7 X10 min      Knee/Hip Exercises: Machines for Strengthening   Total Gym Leg Press 131# DL 3X10, Left LE Only: 81# 2X10      Manual Therapy   Manual therapy comments Lt knee PROM, extension mobs, compression and palpation to gastroc and H.S. with DN              Trigger Point Dry Needling - 09/22/20 0001     Consent Given? Yes    Muscles Treated Lower Quadrant Hamstring;Gastrocnemius    Gastrocnemius Response Twitch response elicited                    PT Short Term Goals - 08/16/20 1535       PT SHORT TERM GOAL #1   Title [name] will report independence and compliance with her starter HEP addressing AROM and quadriceps strength impairments.    Status Achieved               PT Long Term Goals - 09/12/20 1530       PT LONG TERM GOAL #1   Title Improve FOTO score to 59.    Status On-going      PT LONG TERM GOAL #2   Title Improve L knee pain to consistently 0-3/10 on the Numeric Pain Rating Scale.    Status On-going      PT LONG TERM GOAL #3   Title [name] will be able to walk 1/4 mile or more from her car to her office without an assistive device or complaints of functional difficulty.  Status Achieved      PT LONG TERM GOAL #4   Title Improve L knee AROM for extension  to -3 degrees or better and flexion to 110 degrees or better by DC.    Status On-going      PT LONG TERM GOAL #5   Title [name] will be independent and compliant with her long-term HEP at DC.    Status Achieved                   Plan - 09/22/20 1526     Clinical Impression Statement Session foucsed on aggressive stretching for knee extension ROM today. She was observed to have less than 5 deg knee extension after session. Continue PT for 6 more weeks as prescribed by MD    Personal Factors and Comorbidities Comorbidity 3+    Comorbidities Previous R knee scope in 2018, bariatric surgery, CHF    Examination-Activity Limitations Bathing;Dressing;Sit;Transfers;Bed Mobility;Sleep;Bend;Lift;Squat;Locomotion Level;Stairs;Carry;Stand    Examination-Participation Restrictions Interpersonal Relationship;Occupation;Yard Work;Community Activity    Stability/Clinical Decision Making Stable/Uncomplicated    Rehab Potential Good    PT Frequency 3x / week    PT Duration 8 weeks    PT Treatment/Interventions ADLs/Self Care Home Management;Electrical Stimulation;Cryotherapy;Therapeutic activities;Stair training;Gait training;Therapeutic exercise;Balance training;Neuromuscular re-education;Patient/family education;Manual techniques;Vasopneumatic Device    PT Next Visit Plan assess response to DN  ROM focus flexion and extension, hip/knee strength    PT Home Exercise Plan Access Code: [email or web address]    Consulted and Agree with Plan of Care Patient             Patient will benefit from skilled therapeutic intervention in order to improve the following deficits and impairments:  Abnormal gait, Decreased activity tolerance, Decreased balance, Decreased endurance, Decreased range of motion, Decreased strength, Difficulty walking, Increased edema, Impaired flexibility, Pain, Obesity  Visit Diagnosis: Difficulty walking  Muscle weakness (generalized)  Stiffness of left knee, not elsewhere  classified  Chronic pain of left knee     Problem List Patient Active Problem List   Diagnosis Date Noted   Stiffness of left knee 08/18/2020   Status post total left knee replacement 05/23/2020   Primary osteoarthritis of left knee 05/05/2020   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   Fatty liver disease, nonalcoholic 99991111   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   Trigger finger, right middle finger 10/10/2018   Other insomnia 06/23/2018   Vitamin D deficiency 03/26/2018   Prediabetes 03/11/2018   Obesity 02/26/2018   Migraines 10/01/2016   Decreased cardiac ejection fraction 10/01/2016   Severe obesity ([place]) 10/01/2016   Shingles 08/03/2016   Chronic pain of right knee 05/24/2016   Unilateral primary osteoarthritis, right knee 05/24/2016   Posterior tibial tendinitis, right leg [id number]   Lichen sclerosus [id number]    Silvestre Mesi 09/22/2020, 3:30 PM  [facility] Physical Therapy [address] [place], [place], [address] Phone: [phone]   Fax:  [phone]  Name: LENNIS DYNES MRN: [id number] Date of Birth: 10-05-1960

## 2020-09-22 NOTE — Telephone Encounter (Signed)
Received medical records release form, $25.00 cash and disability paperwork from patient     Forwarding to [facility] today

## 2020-09-22 NOTE — Progress Notes (Signed)
Post-Op Visit Note   Patient: Savannah [name]           Date of Birth: Oct 12, 1960           MRN: [id number] Visit Date: 09/22/2020 PCP: Lawerance Cruel, MD   Assessment & Plan:  Chief Complaint:  Chief Complaint  Patient presents with   Left Knee - Pain   Visit Diagnoses:  1. Hx of total knee replacement, left     Plan: Patient is a pleasant 60 year old female who comes in today approximately 4 months status post left total knee replacement, date of surgery 05/23/2020 and approximately 5 weeks status post left knee manipulation under anesthesia, 08/18/2020.  She is still complaining of quad tightness and soreness which is not improved with Skelaxin.  She is also complaining of tightness to the anterior lateral knee.  She is in physical therapy.  She does not feel as though she is making progress.  Examination of the left knee shows range of motion from 10 to 105 degrees.  She is stable to valgus varus stress.  She is neurovascular intact distally.  At this point, we have discussed calling in tramadol which will be stronger than Tylenol as I am hoping this will help with range of motion progression.  We will also change to a different muscle relaxer.  She will continue with heat and ice as needed.  Dental prophylaxis reinforced.  Follow-up with [place] in 6 weeks time for recheck.  Call with concerns or questions.  Follow-Up Instructions: Return in about 6 weeks (around 11/03/2020).   Orders:  No orders of the defined types were placed in this encounter.  No orders of the defined types were placed in this encounter.   Imaging: No new imaging  PMFS History: Patient Active Problem List   Diagnosis Date Noted   Stiffness of left knee 08/18/2020   Status post total left knee replacement 05/23/2020   Primary osteoarthritis of left knee 05/05/2020   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   Fatty liver disease, nonalcoholic [id number]   Persistent vertigo of central origin  02/10/2019   Trigger finger, right middle finger 10/10/2018   Other insomnia 06/23/2018   Vitamin D deficiency 03/26/2018   Prediabetes 03/11/2018   Obesity 02/26/2018   Migraines 10/01/2016   Decreased cardiac ejection fraction 10/01/2016   Severe obesity ([place]) 10/01/2016   Shingles 08/03/2016   Chronic pain of right knee 05/24/2016   Unilateral primary osteoarthritis, right knee 05/24/2016   Posterior tibial tendinitis, right leg [id number]   Lichen sclerosus [id number]   Past Medical History:  Diagnosis Date   Arthritis    bilateral knees, recent LCL sprain-on left knee   CHF (congestive heart failure) ([place])    Dysrhythmia    trigem   Environmental allergies    Family history of adverse reaction to anesthesia    slow to wake up   Fatty liver    Headache(784.0)    migraines-on meds for control, relpax, ketoprophen, vicoden, muscle relaxer   Hearing loss 2016   High frequency hearing - Bilateral    History of hiatal hernia    Hot flashes    Joint pain    Lichen sclerosus    Lipoma    Right Knee   Menorrhagia    Migraines    Necrobiosis lipoidica    Osteoarthritis    Overweight    Pelvic pain in female    PONV (postoperative nausea and vomiting)    ponv, slow to awake  Seasonal allergies    Sinus problem    Sprain 10/25/2010   left knee   Trigeminal pulse    Trigger finger, right    Middle finger and right wrist    Vitamin D deficiency     Family History  Problem Relation Age of Onset   Skin cancer Mother    Migraines Mother    High blood pressure Mother    Heart disease Father    High blood pressure Father    High Cholesterol Father    Breast cancer Maternal Aunt 4   Breast cancer Paternal Aunt 59   Migraines Brother     Past Surgical History:  Procedure Laterality Date   APPENDECTOMY     CHOLECYSTECTOMY     DIAGNOSTIC LAPAROSCOPY  08/2006   DILATION AND CURETTAGE OF UTERUS  2011   attempted ablation   FUNCTIONAL ENDOSCOPIC SINUS SURGERY      ganglion cyst removal     HYSTEROSCOPY     failed   KNEE ARTHROPLASTY Right 2018   meniscus   KNEE CLOSED REDUCTION Left 08/18/2020   Procedure: LEFT KNEE MANIPULATION UNDER ANESTHESIA;  Surgeon: Leandrew Koyanagi, MD;  Location: [place];  Service: Orthopedics;  Laterality: Left;   LAPAROSCOPIC GASTRIC SLEEVE RESECTION  01/2020   LASIK     LIPOMA EXCISION Right 07/22/2014   Procedure: EXCISION RIGHT THIGH LIPOMA;  Surgeon: Erroll Luna, MD;  Location: [place];  Service: General;  Laterality: Right;   ROBOTIC ASSISTED TOTAL HYSTERECTOMY  11/06/10   TLH/RSO   SALPINGOOPHORECTOMY  11/06/2010   Procedure: SALPINGO OOPHERECTOMY;  Surgeon: Felipa Emory;  Location: [facility];  Service: Gynecology;  Laterality: Right;   TOTAL KNEE ARTHROPLASTY Left 05/23/2020   Procedure: LEFT TOTAL KNEE ARTHROPLASTY;  Surgeon: Leandrew Koyanagi, MD;  Location: [place];  Service: Orthopedics;  Laterality: Left;   TRIGGER FINGER RELEASE     UPPER GI ENDOSCOPY N/A 02/02/2020   Procedure: UPPER GI ENDOSCOPY;  Surgeon: [name], MD;  Location: [facility];  Service: General;  Laterality: N/A;   Social History   Occupational History   Occupation: Administrator, [employer]  Tobacco Use   Smoking status: Never   Smokeless tobacco: Never  Vaping Use   Vaping Use: Never used  Substance and Sexual Activity   Alcohol use: No    Alcohol/week: 0.0 standard drinks   Drug use: No   Sexual activity: Yes    Birth control/protection: Surgical    Comment: TLH/RSO

## 2020-09-27 ENCOUNTER — Ambulatory Visit (INDEPENDENT_AMBULATORY_CARE_PROVIDER_SITE_OTHER): Payer: BC Managed Care – PPO | Admitting: Physical Therapy

## 2020-09-27 ENCOUNTER — Other Ambulatory Visit: Payer: Self-pay

## 2020-09-27 DIAGNOSIS — M25662 Stiffness of left knee, not elsewhere classified: Secondary | ICD-10-CM

## 2020-09-27 DIAGNOSIS — M6281 Muscle weakness (generalized): Secondary | ICD-10-CM | POA: Diagnosis not present

## 2020-09-27 DIAGNOSIS — G8929 Other chronic pain: Secondary | ICD-10-CM

## 2020-09-27 DIAGNOSIS — R262 Difficulty in walking, not elsewhere classified: Secondary | ICD-10-CM

## 2020-09-27 DIAGNOSIS — M25562 Pain in left knee: Secondary | ICD-10-CM

## 2020-09-27 DIAGNOSIS — R600 Localized edema: Secondary | ICD-10-CM

## 2020-09-27 NOTE — Therapy (Signed)
[facility] Physical Therapy [address] [place], [place], [address] Phone: [phone]   Fax:  [phone]  Physical Therapy Treatment  Patient Details  Name: Savannah [name] MRN: [id number] Date of Birth: [birth year] Referring Provider (PT): Frankey Shown, MD   Encounter Date: 09/27/2020   PT End of Session - 09/27/20 1451     Visit Number 33    Number of Visits 6    Date for PT Re-Evaluation 10/07/20    Progress Note Due on Visit 68    PT Start Time 0930    PT Stop Time 1020    PT Time Calculation (min) 50 min    Activity Tolerance Patient tolerated treatment well    Behavior During Therapy [facility] for tasks assessed/performed             Past Medical History:  Diagnosis Date   Arthritis    bilateral knees, recent LCL sprain-on left knee   CHF (congestive heart failure) ([place])    Dysrhythmia    trigem   Environmental allergies    Family history of adverse reaction to anesthesia    slow to wake up   Fatty liver    Headache(784.0)    migraines-on meds for control, relpax, ketoprophen, vicoden, muscle relaxer   Hearing loss 2016   High frequency hearing - Bilateral    History of hiatal hernia    Hot flashes    Joint pain    Lichen sclerosus    Lipoma    Right Knee   Menorrhagia    Migraines    Necrobiosis lipoidica    Osteoarthritis    Overweight    Pelvic pain in female    PONV (postoperative nausea and vomiting)    ponv, slow to awake   Seasonal allergies    Sinus problem    Sprain 10/25/2010   left knee   Trigeminal pulse    Trigger finger, right    Middle finger and right wrist    Vitamin D deficiency     Past Surgical History:  Procedure Laterality Date   APPENDECTOMY     CHOLECYSTECTOMY     DIAGNOSTIC LAPAROSCOPY  08/2006   DILATION AND CURETTAGE OF UTERUS  2011   attempted ablation   FUNCTIONAL ENDOSCOPIC SINUS SURGERY     ganglion cyst removal     HYSTEROSCOPY     failed   KNEE ARTHROPLASTY Right 2018   meniscus    KNEE CLOSED REDUCTION Left 08/18/2020   Procedure: LEFT KNEE MANIPULATION UNDER ANESTHESIA;  Surgeon: Leandrew Koyanagi, MD;  Location: [place];  Service: Orthopedics;  Laterality: Left;   LAPAROSCOPIC GASTRIC SLEEVE RESECTION  01/2020   LASIK     LIPOMA EXCISION Right 07/22/2014   Procedure: EXCISION RIGHT THIGH LIPOMA;  Surgeon: Erroll Luna, MD;  Location: [place];  Service: General;  Laterality: Right;   ROBOTIC ASSISTED TOTAL HYSTERECTOMY  11/06/10   TLH/RSO   SALPINGOOPHORECTOMY  11/06/2010   Procedure: SALPINGO OOPHERECTOMY;  Surgeon: Felipa Emory;  Location: [facility];  Service: Gynecology;  Laterality: Right;   TOTAL KNEE ARTHROPLASTY Left 05/23/2020   Procedure: LEFT TOTAL KNEE ARTHROPLASTY;  Surgeon: Leandrew Koyanagi, MD;  Location: [place];  Service: Orthopedics;  Laterality: Left;   TRIGGER FINGER RELEASE     UPPER GI ENDOSCOPY N/A 02/02/2020   Procedure: UPPER GI ENDOSCOPY;  Surgeon: [name], MD;  Location: [facility];  Service: General;  Laterality: N/A;    There were no vitals  filed for this visit.   Subjective Assessment - 09/27/20 0932     Subjective denies pain but having stiffness in front and back of her knee    Patient is accompained by: Family member    Pertinent History s/p left knee manipulation on 08/18/2020, R knee scope 2018, Bariatric surgery, CHF    Limitations Sitting;House hold activities;Standing;Walking    How long can you sit comfortably? Stiffness with standing after prolonged sitting    How long can you stand comfortably? 15-20 minutes (was 10-15 minutes)    How long can you walk comfortably? 30-45 minutes (was 20-25 minutes)    Patient Stated Goals Walk better without the cane for yard work and return to work    Pain Onset More than a month ago                [facility] PT Assessment - 09/27/20 0001       Assessment   Medical Diagnosis s/p L TKA, s/p manipulation on 08/18/2020    Referring Provider (PT) Frankey Shown, MD                            [facility] Adult PT Treatment/Exercise - 09/27/20 0001       Knee/Hip Exercises: Stretches   Active Hamstring Stretch Left;3 reps;30 seconds    Active Hamstring Stretch Limitations standing with propped on treadmill    Gastroc Stretch Both;3 reps;30 seconds    Gastroc Stretch Limitations slantboard      Knee/Hip Exercises: Aerobic   Recumbent Bike 10 min L3      Knee/Hip Exercises: Machines for Strengthening   Total Gym Leg Press 137# DL 3X10, Left LE Only: 81# 3X10      Knee/Hip Exercises: Standing   Other Standing Knee Exercises TRX squats 2X10 holding 2-3 seconds, TRX lunges X10 bilat      Manual Therapy   Manual therapy comments Lt knee PROM, extension and flexion mobs, compression and palpation to gastroc and H.S. and quads with DN              Trigger Point Dry Needling - 09/27/20 0001     Consent Given? Yes    Muscles Treated Lower Quadrant Hamstring;Gastrocnemius;Vastus lateralis    Vastus lateralis Response Twitch response elicited    Gastrocnemius Response Twitch response elicited                    PT Short Term Goals - 08/16/20 1535       PT SHORT TERM GOAL #1   Title Savannah [name] will report independence and compliance with her starter HEP addressing AROM and quadriceps strength impairments.    Status Achieved               PT Long Term Goals - 09/12/20 1530       PT LONG TERM GOAL #1   Title Improve FOTO score to 59.    Status On-going      PT LONG TERM GOAL #2   Title Improve L knee pain to consistently 0-3/10 on the Numeric Pain Rating Scale.    Status On-going      PT LONG TERM GOAL #3   Title Savannah [name] will be able to walk 1/4 mile or more from her car to her office without an assistive device or complaints of functional difficulty.    Status Achieved      PT LONG TERM GOAL #4   Title Improve L  knee AROM for extension to -3 degrees or better and flexion to 110 degrees or better by DC.    Status  On-going      PT LONG TERM GOAL #5   Title Savannah [name] will be independent and compliant with her long-term HEP at DC.    Status Achieved                   Plan - 09/27/20 1454     Clinical Impression Statement heavy ROM focus with great tolerance to this. Strength wise she is doing great but continues to be limited by knee stiffness that we will aggressively work to improve. DN does appear to be helping.    Personal Factors and Comorbidities Comorbidity 3+    Comorbidities Previous R knee scope in 2018, bariatric surgery, CHF    Examination-Activity Limitations Bathing;Dressing;Sit;Transfers;Bed Mobility;Sleep;Bend;Lift;Squat;Locomotion Level;Stairs;Carry;Stand    Examination-Participation Restrictions Interpersonal Relationship;Occupation;Yard Work;Community Activity    Stability/Clinical Decision Making Stable/Uncomplicated    Rehab Potential Good    PT Frequency 3x / week    PT Duration 8 weeks    PT Treatment/Interventions ADLs/Self Care Home Management;Electrical Stimulation;Cryotherapy;Therapeutic activities;Stair training;Gait training;Therapeutic exercise;Balance training;Neuromuscular re-education;Patient/family education;Manual techniques;Vasopneumatic Device    PT Next Visit Plan assess response to DN  ROM focus flexion and extension, hip/knee strength    PT Home Exercise Plan Access Code: [email or web address]    Consulted and Agree with Plan of Care Patient             Patient will benefit from skilled therapeutic intervention in order to improve the following deficits and impairments:  Abnormal gait, Decreased activity tolerance, Decreased balance, Decreased endurance, Decreased range of motion, Decreased strength, Difficulty walking, Increased edema, Impaired flexibility, Pain, Obesity  Visit Diagnosis: Difficulty walking  Muscle weakness (generalized)  Stiffness of left knee, not elsewhere classified  Chronic pain of left knee  Localized edema     Problem  List Patient Active Problem List   Diagnosis Date Noted   Stiffness of left knee 08/18/2020   Status post total left knee replacement 05/23/2020   Primary osteoarthritis of left knee 05/05/2020   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   Fatty liver disease, nonalcoholic [id number]   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   Trigger finger, right middle finger 10/10/2018   Other insomnia 06/23/2018   Vitamin D deficiency 03/26/2018   Prediabetes 03/11/2018   Obesity 02/26/2018   Migraines 10/01/2016   Decreased cardiac ejection fraction 10/01/2016   Severe obesity ([place]) 10/01/2016   Shingles 08/03/2016   Chronic pain of right knee 05/24/2016   Unilateral primary osteoarthritis, right knee 05/24/2016   Posterior tibial tendinitis, right leg [id number]   Lichen sclerosus [id number]    Silvestre Mesi 09/27/2020, 2:57 PM  [facility] Physical Therapy [address] [place], [place], [address] Phone: [phone]   Fax:  [phone]  Name: Savannah [name] MRN: [id number] Date of Birth: 08-16-1960

## 2020-09-30 ENCOUNTER — Encounter: Payer: Self-pay | Admitting: Rehabilitative and Restorative Service Providers"

## 2020-09-30 ENCOUNTER — Ambulatory Visit (INDEPENDENT_AMBULATORY_CARE_PROVIDER_SITE_OTHER): Payer: BC Managed Care – PPO | Admitting: Rehabilitative and Restorative Service Providers"

## 2020-09-30 ENCOUNTER — Other Ambulatory Visit: Payer: Self-pay

## 2020-09-30 DIAGNOSIS — M25562 Pain in left knee: Secondary | ICD-10-CM

## 2020-09-30 DIAGNOSIS — R600 Localized edema: Secondary | ICD-10-CM

## 2020-09-30 DIAGNOSIS — G8929 Other chronic pain: Secondary | ICD-10-CM

## 2020-09-30 DIAGNOSIS — R262 Difficulty in walking, not elsewhere classified: Secondary | ICD-10-CM

## 2020-09-30 DIAGNOSIS — M25662 Stiffness of left knee, not elsewhere classified: Secondary | ICD-10-CM | POA: Diagnosis not present

## 2020-09-30 DIAGNOSIS — M6281 Muscle weakness (generalized): Secondary | ICD-10-CM

## 2020-09-30 NOTE — Therapy (Signed)
[place], [place], [address] Phone: [phone]   Fax:  [phone]  Physical Therapy Treatment/Reassessment  [name] Details  Name: Savannah [name] MRN: [id number] Date of Birth: February 03, 1961 Referring Provider (PT): Frankey Shown, MD   Encounter Date: 09/30/2020   PT End of Session - 09/30/20 0926     Visit Number 34    Number of Visits 50    Date for PT Re-Evaluation 11/04/20    Progress Note Due on Visit 60    PT Start Time 0847    PT Stop Time 0932    PT Time Calculation (min) 45 min    Activity Tolerance [name] tolerated treatment well    Behavior During Therapy [facility] for tasks assessed/performed             Past Medical History:  Diagnosis Date   Arthritis    bilateral knees, recent LCL sprain-on left knee   CHF (congestive heart failure) ([place])    Dysrhythmia    trigem   Environmental allergies    Family history of adverse reaction to anesthesia    slow to wake up   Fatty liver    Headache(784.0)    migraines-on meds for control, relpax, ketoprophen, vicoden, muscle relaxer   Hearing loss 2016   High frequency hearing - Bilateral    History of hiatal hernia    Hot flashes    Joint pain    Lichen sclerosus    Lipoma    Right Knee   Menorrhagia    Migraines    Necrobiosis lipoidica    Osteoarthritis    Overweight    Pelvic pain in female    PONV (postoperative nausea and vomiting)    ponv, slow to awake   Seasonal allergies    Sinus problem    Sprain 10/25/2010   left knee   Trigeminal pulse    Trigger finger, right    Middle finger and right wrist    Vitamin D deficiency     Past Surgical History:  Procedure Laterality Date   APPENDECTOMY     CHOLECYSTECTOMY     DIAGNOSTIC LAPAROSCOPY  08/2006   DILATION AND CURETTAGE OF UTERUS  2011   attempted ablation   FUNCTIONAL ENDOSCOPIC SINUS SURGERY     ganglion cyst removal     HYSTEROSCOPY     failed   KNEE ARTHROPLASTY Right 2018    meniscus   KNEE CLOSED REDUCTION Left 08/18/2020   Procedure: LEFT KNEE MANIPULATION UNDER ANESTHESIA;  Surgeon: Leandrew Koyanagi, MD;  Location: [place];  Service: Orthopedics;  Laterality: Left;   LAPAROSCOPIC GASTRIC SLEEVE RESECTION  01/2020   LASIK     LIPOMA EXCISION Right 07/22/2014   Procedure: EXCISION RIGHT THIGH LIPOMA;  Surgeon: Erroll Luna, MD;  Location: [place];  Service: General;  Laterality: Right;   ROBOTIC ASSISTED TOTAL HYSTERECTOMY  11/06/10   TLH/RSO   SALPINGOOPHORECTOMY  11/06/2010   Procedure: SALPINGO OOPHERECTOMY;  Surgeon: Felipa Emory;  Location: [facility];  Service: Gynecology;  Laterality: Right;   TOTAL KNEE ARTHROPLASTY Left 05/23/2020   Procedure: LEFT TOTAL KNEE ARTHROPLASTY;  Surgeon: Leandrew Koyanagi, MD;  Location: [place];  Service: Orthopedics;  Laterality: Left;   TRIGGER FINGER RELEASE     UPPER GI ENDOSCOPY N/A 02/02/2020   Procedure: UPPER GI ENDOSCOPY;  Surgeon: [name], MD;  Location: [facility];  Service: General;  Laterality: N/A;    There were no vitals  filed for this visit.   Subjective Assessment - 09/30/20 0852     Subjective Savannah [name] has trouble walking when her "L thigh is hard" and walks fine when it "feels normal."  She reports good HEP compliance.    [name] is accompained by: Family member    Pertinent History s/p left knee manipulation on 08/18/2020, R knee scope 2018, Bariatric surgery, CHF    Limitations Sitting;House hold activities;Standing;Walking    How long can you sit comfortably? Stiffness with standing after prolonged sitting    How long can you stand comfortably? 20-30 minutes (was 10-15 minutes)    How long can you walk comfortably? 30 minutes (was 20-25 minutes)    [name] Stated Goals Walk better without the cane for yard work and return to work    Currently in Pain? No/denies    Pain Score 0-No pain    Pain Location Knee    Pain Orientation Left    Pain Descriptors / Indicators Tightness     Pain Type Chronic pain;Surgical pain    Pain Radiating Towards NA    Pain Onset More than a month ago    Pain Frequency Constant    Aggravating Factors  Bending the L knee functionally    Pain Relieving Factors Heat, exercises and tylenol    Effect of Pain on Daily Activities Stiffness with gait is improving but still limits flexion    Multiple Pain Sites No                [facility] PT Assessment - 09/30/20 0001       PROM   Overall PROM  Deficits    PROM Assessment Site Knee    Right/Left Knee Left    Left Knee Extension -7    Left Knee Flexion 106                           [facility] Adult PT Treatment/Exercise - 09/30/20 0001       Exercises   Exercises Knee/Hip      Knee/Hip Exercises: Aerobic   Recumbent Bike Seat 5 for 10 minutes      Knee/Hip Exercises: Machines for Strengthening   Total Gym Leg Press DL 137# 2 sets of 15 & 81# L leg only 2 sets of 10 slow eccentrics and push into full flexion/extension      Knee/Hip Exercises: Supine   Quad Sets Strengthening;Both;2 sets;10 reps;Limitations    Quad Sets Limitations 5 seconds (toes back, press knees down and tighten thighs)    Heel Slides AAROM;Left;10 reps;Limitations    Heel Slides Limitations 10 seconds with belt      Manual Therapy   Manual therapy comments Dry needling L thigh (lateral)              Trigger Point Dry Needling - 09/30/20 0001     Consent Given? Yes    Muscles Treated Lower Quadrant Quadriceps;Vastus lateralis;Peroneals;Gastrocnemius;Hamstring    Dry Needling Comments performed by Elsie Ra PT,DPT, good overall tolerance to intervention    Vastus lateralis Response Twitch response elicited    Gastrocnemius Response Twitch response elicited                  PT Education - 09/30/20 [id number]     Education Details Reviewed PROM measures and focus on quadriceps strength and AROM with HEP.    Person(s) Educated [name]    Methods  Explanation;Demonstration;Tactile cues;Verbal cues    Comprehension Verbal cues  required;Returned demonstration;Verbalized understanding;Tactile cues required              PT Short Term Goals - 08/16/20 1535       PT SHORT TERM GOAL #1   Title Savannah [name] will report independence and compliance with her starter HEP addressing AROM and quadriceps strength impairments.    Status Achieved               PT Long Term Goals - 09/30/20 0925       PT LONG TERM GOAL #1   Title Improve FOTO score to 59.    Status On-going    Target Date 10/28/20      PT LONG TERM GOAL #2   Title Improve L knee pain to consistently 0-3/10 on the Numeric Pain Rating Scale.    Status On-going    Target Date 10/28/20      PT LONG TERM GOAL #3   Title Savannah [name] will be able to walk 1/4 mile or more from her car to her office without an assistive device or complaints of functional difficulty.    Status Achieved    Target Date 10/28/20      PT LONG TERM GOAL #4   Title Improve L knee AROM for extension to -3 degrees or better and flexion to 110 degrees or better by DC.    Baseline -7 to 106 PROM 09/30/2020    Status On-going    Target Date 10/28/20      PT LONG TERM GOAL #5   Title Savannah [name] will be independent and compliant with her long-term HEP at DC.    Status Achieved                   Plan - 09/30/20 1221     Clinical Impression Statement Emphasis remains knee AROM, particularly extension.  Savannah [name] reports she is most limited by thigh "hardness" that when present really limits gait.  Sometimes this is absent and she "walks fine."  Savannah [name] was given the option of more independent rehabilitation but she would like to continue supervised PT for another 2-4 weeks to see if strength and dry needling work will address thigh "hardness" and continue to work on AROM.  I anticipate AROM is close to it's long-term expected value.    Personal Factors and Comorbidities Comorbidity 3+    Comorbidities Previous  R knee scope in 2018, bariatric surgery, CHF    Examination-Activity Limitations Bathing;Dressing;Sit;Transfers;Bed Mobility;Sleep;Bend;Lift;Squat;Locomotion Level;Stairs;Carry;Stand    Examination-Participation Restrictions Interpersonal Relationship;Occupation;Yard Work;Community Activity    Stability/Clinical Decision Making Stable/Uncomplicated    Rehab Potential Good    PT Frequency 3x / week    PT Duration 4 weeks    PT Treatment/Interventions ADLs/Self Care Home Management;Electrical Stimulation;Cryotherapy;Therapeutic activities;Stair training;Gait training;Therapeutic exercise;Balance training;Neuromuscular re-education;[name]/family education;Manual techniques;Vasopneumatic Device    PT Next Visit Plan Assess response to DN.  AROM focus flexion and (particularly) extension.  Hip/knee (quadriceps) strength.    PT Home Exercise Plan Access Code: [email or web address]    Consulted and Agree with Plan of Care [name]             [name] will benefit from skilled therapeutic intervention in order to improve the following deficits and impairments:  Abnormal gait, Decreased activity tolerance, Decreased balance, Decreased endurance, Decreased range of motion, Decreased strength, Difficulty walking, Increased edema, Impaired flexibility, Pain, Obesity  Visit Diagnosis: Difficulty walking - Plan: PT plan of care cert/re-cert  Muscle weakness (generalized) - Plan: PT plan of care cert/re-cert  Stiffness of left  knee, not elsewhere classified - Plan: PT plan of care cert/re-cert  Chronic pain of left knee - Plan: PT plan of care cert/re-cert  Localized edema - Plan: PT plan of care cert/re-cert     Problem List [name] Active Problem List   Diagnosis Date Noted   Stiffness of left knee 08/18/2020   Status post total left knee replacement 05/23/2020   Primary osteoarthritis of left knee 05/05/2020   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   Fatty liver disease, nonalcoholic  [id number]   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   Trigger finger, right middle finger 10/10/2018   Other insomnia 06/23/2018   Vitamin D deficiency 03/26/2018   Prediabetes 03/11/2018   Obesity 02/26/2018   Migraines 10/01/2016   Decreased cardiac ejection fraction 10/01/2016   Severe obesity ([place]) 10/01/2016   Shingles 08/03/2016   Chronic pain of right knee 05/24/2016   Unilateral primary osteoarthritis, right knee 05/24/2016   Posterior tibial tendinitis, right leg [id number]   Lichen sclerosus [id number]    Farley Ly PT, MPT 09/30/2020, 12:30 PM  [facility] Physical Therapy [address] [place], [place], [address] Phone: [phone]   Fax:  [phone]  Name: Savannah [name] MRN: [id number] Date of Birth: [birth year]

## 2020-09-30 NOTE — Patient Instructions (Signed)
Continue HEP.  Encouraged emphasis on quadriceps sets (extension AROM and strength) and knee flexion (seated or supine with belt) multiple times/day

## 2020-10-04 ENCOUNTER — Other Ambulatory Visit: Payer: Self-pay

## 2020-10-04 ENCOUNTER — Ambulatory Visit (INDEPENDENT_AMBULATORY_CARE_PROVIDER_SITE_OTHER): Payer: BC Managed Care – PPO | Admitting: Physical Therapy

## 2020-10-04 DIAGNOSIS — G8929 Other chronic pain: Secondary | ICD-10-CM

## 2020-10-04 DIAGNOSIS — M25562 Pain in left knee: Secondary | ICD-10-CM | POA: Diagnosis not present

## 2020-10-04 DIAGNOSIS — R262 Difficulty in walking, not elsewhere classified: Secondary | ICD-10-CM | POA: Diagnosis not present

## 2020-10-04 DIAGNOSIS — M25662 Stiffness of left knee, not elsewhere classified: Secondary | ICD-10-CM | POA: Diagnosis not present

## 2020-10-04 DIAGNOSIS — M6281 Muscle weakness (generalized): Secondary | ICD-10-CM

## 2020-10-04 DIAGNOSIS — R600 Localized edema: Secondary | ICD-10-CM

## 2020-10-04 NOTE — Therapy (Signed)
[facility] Physical Therapy [address] [place], [place], [address] Phone: [phone]   Fax:  [phone]  Physical Therapy Treatment  Patient Details  Name: Savannah [name] MRN: [id number] Date of Birth: 14-Sep-1960 Referring Provider (PT): Frankey Shown, MD   Encounter Date: 10/04/2020   PT End of Session - 10/04/20 0933     Visit Number 35    Number of Visits 87    Date for PT Re-Evaluation 11/04/20    Progress Note Due on Visit 69    PT Start Time 0846    PT Stop Time 0934    PT Time Calculation (min) 48 min    Activity Tolerance Patient tolerated treatment well    Behavior During Therapy [facility] for tasks assessed/performed             Past Medical History:  Diagnosis Date   Arthritis    bilateral knees, recent LCL sprain-on left knee   CHF (congestive heart failure) ([place])    Dysrhythmia    trigem   Environmental allergies    Family history of adverse reaction to anesthesia    slow to wake up   Fatty liver    Headache(784.0)    migraines-on meds for control, relpax, ketoprophen, vicoden, muscle relaxer   Hearing loss 2016   High frequency hearing - Bilateral    History of hiatal hernia    Hot flashes    Joint pain    Lichen sclerosus    Lipoma    Right Knee   Menorrhagia    Migraines    Necrobiosis lipoidica    Osteoarthritis    Overweight    Pelvic pain in female    PONV (postoperative nausea and vomiting)    ponv, slow to awake   Seasonal allergies    Sinus problem    Sprain 10/25/2010   left knee   Trigeminal pulse    Trigger finger, right    Middle finger and right wrist    Vitamin D deficiency     Past Surgical History:  Procedure Laterality Date   APPENDECTOMY     CHOLECYSTECTOMY     DIAGNOSTIC LAPAROSCOPY  08/2006   DILATION AND CURETTAGE OF UTERUS  2011   attempted ablation   FUNCTIONAL ENDOSCOPIC SINUS SURGERY     ganglion cyst removal     HYSTEROSCOPY     failed   KNEE ARTHROPLASTY Right 2018   meniscus    KNEE CLOSED REDUCTION Left 08/18/2020   Procedure: LEFT KNEE MANIPULATION UNDER ANESTHESIA;  Surgeon: Leandrew Koyanagi, MD;  Location: [place];  Service: Orthopedics;  Laterality: Left;   LAPAROSCOPIC GASTRIC SLEEVE RESECTION  01/2020   LASIK     LIPOMA EXCISION Right 07/22/2014   Procedure: EXCISION RIGHT THIGH LIPOMA;  Surgeon: Erroll Luna, MD;  Location: [place];  Service: General;  Laterality: Right;   ROBOTIC ASSISTED TOTAL HYSTERECTOMY  11/06/10   TLH/RSO   SALPINGOOPHORECTOMY  11/06/2010   Procedure: SALPINGO OOPHERECTOMY;  Surgeon: Felipa Emory;  Location: [facility];  Service: Gynecology;  Laterality: Right;   TOTAL KNEE ARTHROPLASTY Left 05/23/2020   Procedure: LEFT TOTAL KNEE ARTHROPLASTY;  Surgeon: Leandrew Koyanagi, MD;  Location: [place];  Service: Orthopedics;  Laterality: Left;   TRIGGER FINGER RELEASE     UPPER GI ENDOSCOPY N/A 02/02/2020   Procedure: UPPER GI ENDOSCOPY;  Surgeon: [name], MD;  Location: [facility];  Service: General;  Laterality: N/A;    There were no vitals  filed for this visit.   Subjective Assessment - 10/04/20 0901     Subjective She denies knee pain today but still has complaints of stiffness which limits her.    Patient is accompained by: Family member    Pertinent History s/p left knee manipulation on 08/18/2020, R knee scope 2018, Bariatric surgery, CHF    Limitations Sitting;House hold activities;Standing;Walking    How long can you sit comfortably? Stiffness with standing after prolonged sitting    How long can you stand comfortably? 20-30 minutes (was 10-15 minutes)    How long can you walk comfortably? 30 minutes (was 20-25 minutes)    Patient Stated Goals Walk better without the cane for yard work and return to work    Pain Onset More than a month ago               [facility] Adult PT Treatment/Exercise - 10/04/20 0001       Knee/Hip Exercises: Stretches   Sports administrator Left;3 reps;60 seconds    Quad Stretch  Limitations prone with strap    Press photographer Both;3 reps;30 seconds    Gastroc Stretch Limitations slantboard    Other Knee/Hip Stretches prone hang 3 min with 3#      Knee/Hip Exercises: Machines for Strengthening   Cybex Knee Extension 15# up with 2, down with left 2X15    Cybex Knee Flexion 35#bilat 2X15    Total Gym Leg Press DL 137# 2 sets of 15 & 81# L leg only 3 sets of 10 slow eccentrics and push into full flexion/extension      Manual Therapy   Manual therapy comments palpation and active compression with DN              Trigger Point Dry Needling - 10/04/20 0001     Consent Given? Yes    Muscles Treated Lower Quadrant Quadriceps;Vastus lateralis;Peroneals;Gastrocnemius;Hamstring    Hamstring Response Twitch response elicited    Gastrocnemius Response Twitch response elicited                    PT Short Term Goals - 08/16/20 1535       PT SHORT TERM GOAL #1   Title [name] will report independence and compliance with her starter HEP addressing AROM and quadriceps strength impairments.    Status Achieved               PT Long Term Goals - 09/30/20 0925       PT LONG TERM GOAL #1   Title Improve FOTO score to 59.    Status On-going    Target Date 10/28/20      PT LONG TERM GOAL #2   Title Improve L knee pain to consistently 0-3/10 on the Numeric Pain Rating Scale.    Status On-going    Target Date 10/28/20      PT LONG TERM GOAL #3   Title [name] will be able to walk 1/4 mile or more from her car to her office without an assistive device or complaints of functional difficulty.    Status Achieved    Target Date 10/28/20      PT LONG TERM GOAL #4   Title Improve L knee AROM for extension to -3 degrees or better and flexion to 110 degrees or better by DC.    Baseline -7 to 106 PROM 09/30/2020    Status On-going    Target Date 10/28/20      PT LONG TERM GOAL #5  Title [name] will be independent and compliant with her long-term HEP at DC.     Status Achieved                   Plan - 10/04/20 0939     Clinical Impression Statement Continued to work on knee ROM focus with general strength. DN appears to be overall helping with her soft tissue restrictions and pain.    Personal Factors and Comorbidities Comorbidity 3+    Comorbidities Previous R knee scope in 2018, bariatric surgery, CHF    Examination-Activity Limitations Bathing;Dressing;Sit;Transfers;Bed Mobility;Sleep;Bend;Lift;Squat;Locomotion Level;Stairs;Carry;Stand    Examination-Participation Restrictions Interpersonal Relationship;Occupation;Yard Work;Community Activity    Stability/Clinical Decision Making Stable/Uncomplicated    Rehab Potential Good    PT Frequency 3x / week    PT Duration 4 weeks    PT Treatment/Interventions ADLs/Self Care Home Management;Electrical Stimulation;Cryotherapy;Therapeutic activities;Stair training;Gait training;Therapeutic exercise;Balance training;Neuromuscular re-education;Patient/family education;Manual techniques;Vasopneumatic Device    PT Next Visit Plan DN if desired. AROM focus flexion and (particularly) extension.  Hip/knee (quadriceps) strength.    PT Home Exercise Plan Access Code: [email or web address]    Consulted and Agree with Plan of Care Patient             Patient will benefit from skilled therapeutic intervention in order to improve the following deficits and impairments:  Abnormal gait, Decreased activity tolerance, Decreased balance, Decreased endurance, Decreased range of motion, Decreased strength, Difficulty walking, Increased edema, Impaired flexibility, Pain, Obesity  Visit Diagnosis: Difficulty walking  Muscle weakness (generalized)  Stiffness of left knee, not elsewhere classified  Chronic pain of left knee  Localized edema     Problem List Patient Active Problem List   Diagnosis Date Noted   Stiffness of left knee 08/18/2020   Status post total left knee replacement 05/23/2020   Primary  osteoarthritis of left knee 05/05/2020   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   Fatty liver disease, nonalcoholic [id number]   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   Trigger finger, right middle finger 10/10/2018   Other insomnia 06/23/2018   Vitamin D deficiency 03/26/2018   Prediabetes 03/11/2018   Obesity 02/26/2018   Migraines 10/01/2016   Decreased cardiac ejection fraction 10/01/2016   Severe obesity ([place]) 10/01/2016   Shingles 08/03/2016   Chronic pain of right knee 05/24/2016   Unilateral primary osteoarthritis, right knee 05/24/2016   Posterior tibial tendinitis, right leg [id number]   Lichen sclerosus [id number]    Silvestre Mesi 10/04/2020, 9:41 AM  [facility] Physical Therapy [address] [place], [place], [address] Phone: [phone]   Fax:  [phone]  Name: Savannah [name] MRN: [id number] Date of Birth: 1961/01/07

## 2020-10-06 ENCOUNTER — Ambulatory Visit (INDEPENDENT_AMBULATORY_CARE_PROVIDER_SITE_OTHER): Payer: BC Managed Care – PPO | Admitting: Physical Therapy

## 2020-10-06 ENCOUNTER — Other Ambulatory Visit: Payer: Self-pay

## 2020-10-06 DIAGNOSIS — M6281 Muscle weakness (generalized): Secondary | ICD-10-CM

## 2020-10-06 DIAGNOSIS — M25562 Pain in left knee: Secondary | ICD-10-CM | POA: Diagnosis not present

## 2020-10-06 DIAGNOSIS — R262 Difficulty in walking, not elsewhere classified: Secondary | ICD-10-CM

## 2020-10-06 DIAGNOSIS — R600 Localized edema: Secondary | ICD-10-CM

## 2020-10-06 DIAGNOSIS — M25662 Stiffness of left knee, not elsewhere classified: Secondary | ICD-10-CM | POA: Diagnosis not present

## 2020-10-06 DIAGNOSIS — G8929 Other chronic pain: Secondary | ICD-10-CM

## 2020-10-06 NOTE — Therapy (Signed)
[facility] Physical Therapy [address] [place], [place], [address] Phone: [phone]   Fax:  [phone]  Physical Therapy Treatment  Patient Details  Name: Savannah [name] MRN: [id number] Date of Birth: July 31, 1960 Referring Provider (PT): Frankey Shown, MD   Encounter Date: 10/06/2020   PT End of Session - 10/06/20 0938     Visit Number 36    Number of Visits 25    Date for PT Re-Evaluation 11/04/20    Progress Note Due on Visit 52    PT Start Time 0845    PT Stop Time 0935    PT Time Calculation (min) 50 min    Activity Tolerance Patient tolerated treatment well    Behavior During Therapy [facility] for tasks assessed/performed             Past Medical History:  Diagnosis Date   Arthritis    bilateral knees, recent LCL sprain-on left knee   CHF (congestive heart failure) ([place])    Dysrhythmia    trigem   Environmental allergies    Family history of adverse reaction to anesthesia    slow to wake up   Fatty liver    Headache(784.0)    migraines-on meds for control, relpax, ketoprophen, vicoden, muscle relaxer   Hearing loss 2016   High frequency hearing - Bilateral    History of hiatal hernia    Hot flashes    Joint pain    Lichen sclerosus    Lipoma    Right Knee   Menorrhagia    Migraines    Necrobiosis lipoidica    Osteoarthritis    Overweight    Pelvic pain in female    PONV (postoperative nausea and vomiting)    ponv, slow to awake   Seasonal allergies    Sinus problem    Sprain 10/25/2010   left knee   Trigeminal pulse    Trigger finger, right    Middle finger and right wrist    Vitamin D deficiency     Past Surgical History:  Procedure Laterality Date   APPENDECTOMY     CHOLECYSTECTOMY     DIAGNOSTIC LAPAROSCOPY  08/2006   DILATION AND CURETTAGE OF UTERUS  2011   attempted ablation   FUNCTIONAL ENDOSCOPIC SINUS SURGERY     ganglion cyst removal     HYSTEROSCOPY     failed   KNEE ARTHROPLASTY Right 2018   meniscus    KNEE CLOSED REDUCTION Left 08/18/2020   Procedure: LEFT KNEE MANIPULATION UNDER ANESTHESIA;  Surgeon: Leandrew Koyanagi, MD;  Location: [place];  Service: Orthopedics;  Laterality: Left;   LAPAROSCOPIC GASTRIC SLEEVE RESECTION  01/2020   LASIK     LIPOMA EXCISION Right 07/22/2014   Procedure: EXCISION RIGHT THIGH LIPOMA;  Surgeon: Erroll Luna, MD;  Location: [place];  Service: General;  Laterality: Right;   ROBOTIC ASSISTED TOTAL HYSTERECTOMY  11/06/10   TLH/RSO   SALPINGOOPHORECTOMY  11/06/2010   Procedure: SALPINGO OOPHERECTOMY;  Surgeon: Felipa Emory;  Location: [facility];  Service: Gynecology;  Laterality: Right;   TOTAL KNEE ARTHROPLASTY Left 05/23/2020   Procedure: LEFT TOTAL KNEE ARTHROPLASTY;  Surgeon: Leandrew Koyanagi, MD;  Location: [place];  Service: Orthopedics;  Laterality: Left;   TRIGGER FINGER RELEASE     UPPER GI ENDOSCOPY N/A 02/02/2020   Procedure: UPPER GI ENDOSCOPY;  Surgeon: [name], MD;  Location[facility];  Service: General;  Laterality: N/A;    There were no vitals  filed for this visit.   Subjective Assessment - 10/06/20 0939     Subjective relays she has been stretching and is getting regular massages but knee still stiiff, not much pain to report    Patient is accompained by: Family member    Pertinent History s/p left knee manipulation on 08/18/2020, R knee scope 2018, Bariatric surgery, CHF    Limitations Sitting;House hold activities;Standing;Walking    How long can you sit comfortably? Stiffness with standing after prolonged sitting    How long can you stand comfortably? 20-30 minutes (was 10-15 minutes)    How long can you walk comfortably? 30 minutes (was 20-25 minutes)    Patient Stated Goals Walk better without the cane for yard work and return to work    Pain Onset More than a month ago                               [facility] Adult PT Treatment/Exercise - 10/06/20 0001       Knee/Hip Exercises: Stretches    Active Hamstring Stretch Left;3 reps;30 seconds    Active Hamstring Stretch Limitations seated with strap    ITB Stretch Left;3 reps;30 seconds    ITB Stretch Limitations supine with strap    Gastroc Stretch Both;3 reps;30 seconds    Gastroc Stretch Limitations slantboard      Knee/Hip Exercises: Aerobic   Recumbent Bike Seat 5 for 10 minutes, L3      Knee/Hip Exercises: Machines for Strengthening   Cybex Knee Extension 15# up with 2, down with left 2X15    Cybex Knee Flexion 35#bilat 2X15    Total Gym Leg Press DL 137# 2 sets of 15 & 81# L leg only 3 sets of 10 slow eccentrics and push into full flexion/extension      Knee/Hip Exercises: Standing   Other Standing Knee Exercises TRX squats 2X10 holding 2-3 seconds, TRX lunges X10 bilat    Other Standing Knee Exercises 6 inch step ups and down in front up with Lt, down with Rt X20 no UeEsupport      Manual Therapy   Manual therapy comments palpation and active compression with DN              Trigger Point Dry Needling - 10/06/20 0001     Muscles Treated Lower Quadrant Quadriceps;Vastus lateralis;Peroneals;Gastrocnemius;Hamstring    Vastus lateralis Response Twitch response elicited    Peroneals Response Twitch response elicited    Hamstring Response Twitch response elicited    Gastrocnemius Response Twitch response elicited                    PT Short Term Goals - 08/16/20 1535       PT SHORT TERM GOAL #1   Title Savannah [name] will report independence and compliance with her starter HEP addressing AROM and quadriceps strength impairments.    Status Achieved               PT Long Term Goals - 09/30/20 0925       PT LONG TERM GOAL #1   Title Improve FOTO score to 59.    Status On-going    Target Date 10/28/20      PT LONG TERM GOAL #2   Title Improve L knee pain to consistently 0-3/10 on the Numeric Pain Rating Scale.    Status On-going    Target Date 10/28/20      PT LONG  TERM GOAL #3   Title  Savannah [name] will be able to walk 1/4 mile or more from her car to her office without an assistive device or complaints of functional difficulty.    Status Achieved    Target Date 10/28/20      PT LONG TERM GOAL #4   Title Improve L knee AROM for extension to -3 degrees or better and flexion to 110 degrees or better by DC.    Baseline -7 to 106 PROM 09/30/2020    Status On-going    Target Date 10/28/20      PT LONG TERM GOAL #5   Title Savannah [name] will be independent and compliant with her long-term HEP at DC.    Status Achieved                   Plan - 10/06/20 0940     Clinical Impression Statement She had great overall tolerance to strength, ROM, and DN today, she does have improvements in ROM and gait after sessions.    Personal Factors and Comorbidities Comorbidity 3+    Comorbidities Previous R knee scope in 2018, bariatric surgery, CHF    Examination-Activity Limitations Bathing;Dressing;Sit;Transfers;Bed Mobility;Sleep;Bend;Lift;Squat;Locomotion Level;Stairs;Carry;Stand    Examination-Participation Restrictions Interpersonal Relationship;Occupation;Yard Work;Community Activity    Stability/Clinical Decision Making Stable/Uncomplicated    Rehab Potential Good    PT Frequency 3x / week    PT Duration 4 weeks    PT Treatment/Interventions ADLs/Self Care Home Management;Electrical Stimulation;Cryotherapy;Therapeutic activities;Stair training;Gait training;Therapeutic exercise;Balance training;Neuromuscular re-education;Patient/family education;Manual techniques;Vasopneumatic Device    PT Next Visit Plan DN if desired. AROM focus flexion and (particularly) extension.  Hip/knee (quadriceps) strength.    PT Home Exercise Plan Access Code: [email or web address]    Consulted and Agree with Plan of Care Patient             Patient will benefit from skilled therapeutic intervention in order to improve the following deficits and impairments:  Abnormal gait, Decreased activity tolerance, Decreased  balance, Decreased endurance, Decreased range of motion, Decreased strength, Difficulty walking, Increased edema, Impaired flexibility, Pain, Obesity  Visit Diagnosis: Difficulty walking  Stiffness of left knee, not elsewhere classified  Muscle weakness (generalized)  Chronic pain of left knee  Localized edema     Problem List Patient Active Problem List   Diagnosis Date Noted   Stiffness of left knee 08/18/2020   Status post total left knee replacement 05/23/2020   Primary osteoarthritis of left knee 05/05/2020   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   Fatty liver disease, nonalcoholic 99991111   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   Trigger finger, right middle finger 10/10/2018   Other insomnia 06/23/2018   Vitamin D deficiency 03/26/2018   Prediabetes 03/11/2018   Obesity 02/26/2018   Migraines 10/01/2016   Decreased cardiac ejection fraction 10/01/2016   Severe obesity ([place]) 10/01/2016   Shingles 08/03/2016   Chronic pain of right knee 05/24/2016   Unilateral primary osteoarthritis, right knee 05/24/2016   Posterior tibial tendinitis, right leg [id number]   Lichen sclerosus [id number]    Silvestre Mesi 10/06/2020, 9:41 AM  [facility] Physical Therapy [address] [place], [place], [address] Phone: [phone]   Fax:  [phone]  Name: Savannah [name] MRN: [id number] Date of Birth: Sep 30, 1960

## 2020-10-09 ENCOUNTER — Other Ambulatory Visit: Payer: Self-pay | Admitting: Cardiovascular Disease

## 2020-10-10 ENCOUNTER — Other Ambulatory Visit: Payer: Self-pay

## 2020-10-10 ENCOUNTER — Encounter: Payer: Self-pay | Admitting: Physical Therapy

## 2020-10-10 ENCOUNTER — Ambulatory Visit (INDEPENDENT_AMBULATORY_CARE_PROVIDER_SITE_OTHER): Payer: BC Managed Care – PPO | Admitting: Physical Therapy

## 2020-10-10 DIAGNOSIS — M25662 Stiffness of left knee, not elsewhere classified: Secondary | ICD-10-CM

## 2020-10-10 DIAGNOSIS — M6281 Muscle weakness (generalized): Secondary | ICD-10-CM

## 2020-10-10 DIAGNOSIS — R262 Difficulty in walking, not elsewhere classified: Secondary | ICD-10-CM

## 2020-10-10 DIAGNOSIS — R600 Localized edema: Secondary | ICD-10-CM

## 2020-10-10 DIAGNOSIS — G8929 Other chronic pain: Secondary | ICD-10-CM

## 2020-10-10 DIAGNOSIS — M25562 Pain in left knee: Secondary | ICD-10-CM | POA: Diagnosis not present

## 2020-10-10 NOTE — Therapy (Signed)
[facility] Therapy [address] [place], [place], [address] Phone: [phone]   Fax:  [phone]  Physical Therapy Treatment  Patient Details  Name: Savannah [name] MRN: [id number] Date of Birth: [birth year] Referring Provider (PT): Frankey Shown, MD   Encounter Date: 10/10/2020   PT End of Session - 10/10/20 1435     Visit Number 37    Number of Visits 40    Date for PT Re-Evaluation 11/04/20    Progress Note Due on Visit 20    PT Start Time 1343    PT Stop Time 1435    PT Time Calculation (min) 52 min    Activity Tolerance Patient tolerated treatment well    Behavior During Therapy [facility] for tasks assessed/performed             Past Medical History:  Diagnosis Date   Arthritis    bilateral knees, recent LCL sprain-on left knee   CHF (congestive heart failure) ([place])    Dysrhythmia    trigem   Environmental allergies    Family history of adverse reaction to anesthesia    slow to wake up   Fatty liver    Headache(784.0)    migraines-on meds for control, relpax, ketoprophen, vicoden, muscle relaxer   Hearing loss 2016   High frequency hearing - Bilateral    History of hiatal hernia    Hot flashes    Joint pain    Lichen sclerosus    Lipoma    Right Knee   Menorrhagia    Migraines    Necrobiosis lipoidica    Osteoarthritis    Overweight    Pelvic pain in female    PONV (postoperative nausea and vomiting)    ponv, slow to awake   Seasonal allergies    Sinus problem    Sprain 10/25/2010   left knee   Trigeminal pulse    Trigger finger, right    Middle finger and right wrist    Vitamin D deficiency     Past Surgical History:  Procedure Laterality Date   APPENDECTOMY     CHOLECYSTECTOMY     DIAGNOSTIC LAPAROSCOPY  08/2006   DILATION AND CURETTAGE OF UTERUS  2011   attempted ablation   FUNCTIONAL ENDOSCOPIC SINUS SURGERY     ganglion cyst removal     HYSTEROSCOPY     failed   KNEE ARTHROPLASTY Right 2018   meniscus    KNEE CLOSED REDUCTION Left 08/18/2020   Procedure: LEFT KNEE MANIPULATION UNDER ANESTHESIA;  Surgeon: Leandrew Koyanagi, MD;  Location: [place];  Service: Orthopedics;  Laterality: Left;   LAPAROSCOPIC GASTRIC SLEEVE RESECTION  01/2020   LASIK     LIPOMA EXCISION Right 07/22/2014   Procedure: EXCISION RIGHT THIGH LIPOMA;  Surgeon: Erroll Luna, MD;  Location: [place];  Service: General;  Laterality: Right;   ROBOTIC ASSISTED TOTAL HYSTERECTOMY  11/06/10   TLH/RSO   SALPINGOOPHORECTOMY  11/06/2010   Procedure: SALPINGO OOPHERECTOMY;  Surgeon: Felipa Emory;  Location: [facility];  Service: Gynecology;  Laterality: Right;   TOTAL KNEE ARTHROPLASTY Left 05/23/2020   Procedure: LEFT TOTAL KNEE ARTHROPLASTY;  Surgeon: Leandrew Koyanagi, MD;  Location: [place];  Service: Orthopedics;  Laterality: Left;   TRIGGER FINGER RELEASE     UPPER GI ENDOSCOPY N/A 02/02/2020   Procedure: UPPER GI ENDOSCOPY;  Surgeon: [name], MD;  Location: [facility];  Service: General;  Laterality: N/A;    There were no vitals  filed for this visit.   Subjective Assessment - 10/10/20 1353     Subjective relays not having any pain, her knee is still stiff but feels less stiff overall    Patient is accompained by: Family member    Pertinent History s/p left knee manipulation on 08/18/2020, R knee scope 2018, Bariatric surgery, CHF    Limitations Sitting;House hold activities;Standing;Walking    How long can you sit comfortably? Stiffness with standing after prolonged sitting    How long can you stand comfortably? 20-30 minutes (was 10-15 minutes)    How long can you walk comfortably? 30 minutes (was 20-25 minutes)    Patient Stated Goals Walk better without the cane for yard work and return to work    Pain Onset More than a month ago              [facility] Adult PT Treatment/Exercise - 10/10/20 0001       Knee/Hip Exercises: Stretches   Active Hamstring Stretch Left;3 reps;30 seconds    Active  Hamstring Stretch Limitations seated with strap    Gastroc Stretch Both;3 reps;30 seconds    Gastroc Stretch Limitations slantboard    Other Knee/Hip Stretches prone hang 4 min with 5#      Knee/Hip Exercises: Aerobic   Recumbent Bike Seat 5 for 10 minutes, L3      Knee/Hip Exercises: Machines for Strengthening   Total Gym Leg Press DL 137# 2 sets of 15 & 81# L leg only 3 sets of 10 slow eccentrics and push into full flexion/extension      Knee/Hip Exercises: Standing   Other Standing Knee Exercises TRX squats 2X10 holding 2-3 seconds, TRX lunges X10 bilat    Other Standing Knee Exercises 6 inch step ups and down in front up with Lt, down with Rt X20 no UeEsupport, Heel raises off of step X15      Manual Therapy   Manual therapy comments palpation and active compression with DN              Trigger Point Dry Needling - 10/10/20 0001     Muscles Treated Lower Quadrant Quadriceps;Vastus lateralis;Peroneals;Gastrocnemius;Hamstring    Vastus lateralis Response Twitch response elicited    Peroneals Response Twitch response elicited    Hamstring Response Twitch response elicited    Gastrocnemius Response Twitch response elicited                    PT Short Term Goals - 08/16/20 1535       PT SHORT TERM GOAL #1   Title Savannah [name] will report independence and compliance with her starter HEP addressing AROM and quadriceps strength impairments.    Status Achieved               PT Long Term Goals - 09/30/20 0925       PT LONG TERM GOAL #1   Title Improve FOTO score to 59.    Status On-going    Target Date 10/28/20      PT LONG TERM GOAL #2   Title Improve L knee pain to consistently 0-3/10 on the Numeric Pain Rating Scale.    Status On-going    Target Date 10/28/20      PT LONG TERM GOAL #3   Title Savannah [name] will be able to walk 1/4 mile or more from her car to her office without an assistive device or complaints of functional difficulty.    Status Achieved     Target Date  10/28/20      PT LONG TERM GOAL #4   Title Improve L knee AROM for extension to -3 degrees or better and flexion to 110 degrees or better by DC.    Baseline -7 to 106 PROM 09/30/2020    Status On-going    Target Date 10/28/20      PT LONG TERM GOAL #5   Title Savannah [name] will be independent and compliant with her long-term HEP at DC.    Status Achieved                   Plan - 10/10/20 1445     Clinical Impression Statement Continued to work on ROM focus, from a flexion standpoint I feel she has a good outcome. She does need more extension ROM, this has significant improvement after session but then the next morning she wakes up and is just as stiff.    Personal Factors and Comorbidities Comorbidity 3+    Comorbidities Previous R knee scope in 2018, bariatric surgery, CHF    Examination-Activity Limitations Bathing;Dressing;Sit;Transfers;Bed Mobility;Sleep;Bend;Lift;Squat;Locomotion Level;Stairs;Carry;Stand    Examination-Participation Restrictions Interpersonal Relationship;Occupation;Yard Work;Community Activity    Stability/Clinical Decision Making Stable/Uncomplicated    Rehab Potential Good    PT Frequency 3x / week    PT Duration 4 weeks    PT Treatment/Interventions ADLs/Self Care Home Management;Electrical Stimulation;Cryotherapy;Therapeutic activities;Stair training;Gait training;Therapeutic exercise;Balance training;Neuromuscular re-education;Patient/family education;Manual techniques;Vasopneumatic Device    PT Next Visit Plan DN if desired. AROM focus (particularly) extension.    PT Home Exercise Plan Access Code: [email or web address]    Consulted and Agree with Plan of Care Patient             Patient will benefit from skilled therapeutic intervention in order to improve the following deficits and impairments:  Abnormal gait, Decreased activity tolerance, Decreased balance, Decreased endurance, Decreased range of motion, Decreased strength, Difficulty walking,  Increased edema, Impaired flexibility, Pain, Obesity  Visit Diagnosis: Difficulty walking  Stiffness of left knee, not elsewhere classified  Muscle weakness (generalized)  Chronic pain of left knee  Localized edema     Problem List Patient Active Problem List   Diagnosis Date Noted   Stiffness of left knee 08/18/2020   Status post total left knee replacement 05/23/2020   Primary osteoarthritis of left knee 05/05/2020   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   Fatty liver disease, nonalcoholic [id number]   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   Trigger finger, right middle finger 10/10/2018   Other insomnia 06/23/2018   Vitamin D deficiency 03/26/2018   Prediabetes 03/11/2018   Obesity 02/26/2018   Migraines 10/01/2016   Decreased cardiac ejection fraction 10/01/2016   Severe obesity ([place]) 10/01/2016   Shingles 08/03/2016   Chronic pain of right knee 05/24/2016   Unilateral primary osteoarthritis, right knee 05/24/2016   Posterior tibial tendinitis, right leg [id number]   Lichen sclerosus [id number]    Silvestre Mesi 10/10/2020, 2:47 PM  [facility] Physical Therapy [address] [place], [place], [address] Phone: [phone]   Fax:  [phone]  Name: SAMIR LEYMAN MRN: [id number] Date of Birth: 04-23-1960

## 2020-10-13 ENCOUNTER — Ambulatory Visit (INDEPENDENT_AMBULATORY_CARE_PROVIDER_SITE_OTHER): Payer: BC Managed Care – PPO | Admitting: Physical Therapy

## 2020-10-13 ENCOUNTER — Other Ambulatory Visit: Payer: Self-pay

## 2020-10-13 DIAGNOSIS — M6281 Muscle weakness (generalized): Secondary | ICD-10-CM | POA: Diagnosis not present

## 2020-10-13 DIAGNOSIS — M25662 Stiffness of left knee, not elsewhere classified: Secondary | ICD-10-CM | POA: Diagnosis not present

## 2020-10-13 DIAGNOSIS — G8929 Other chronic pain: Secondary | ICD-10-CM

## 2020-10-13 DIAGNOSIS — R600 Localized edema: Secondary | ICD-10-CM

## 2020-10-13 DIAGNOSIS — R262 Difficulty in walking, not elsewhere classified: Secondary | ICD-10-CM

## 2020-10-13 DIAGNOSIS — M25562 Pain in left knee: Secondary | ICD-10-CM | POA: Diagnosis not present

## 2020-10-13 NOTE — Therapy (Signed)
[facility] [address] [place], [place], [address] Phone: [phone]   Fax:  [phone]  Physical Therapy Treatment  Patient Details  Name: Savannah [name] MRN: [id number] Date of Birth: 09/24/1960 Referring Provider (PT): Frankey Shown, MD   Encounter Date: 10/13/2020   PT End of Session - 10/13/20 1615     Visit Number 38    Number of Visits 30    Date for PT Re-Evaluation 11/04/20    Progress Note Due on Visit 37    PT Start Time 1547    PT Stop Time 1632    PT Time Calculation (min) 45 min    Activity Tolerance Patient tolerated treatment well    Behavior During Therapy [facility] for tasks assessed/performed             Past Medical History:  Diagnosis Date   Arthritis    bilateral knees, recent LCL sprain-on left knee   CHF (congestive heart failure) ([place])    Dysrhythmia    trigem   Environmental allergies    Family history of adverse reaction to anesthesia    slow to wake up   Fatty liver    Headache(784.0)    migraines-on meds for control, relpax, ketoprophen, vicoden, muscle relaxer   Hearing loss 2016   High frequency hearing - Bilateral    History of hiatal hernia    Hot flashes    Joint pain    Lichen sclerosus    Lipoma    Right Knee   Menorrhagia    Migraines    Necrobiosis lipoidica    Osteoarthritis    Overweight    Pelvic pain in female    PONV (postoperative nausea and vomiting)    ponv, slow to awake   Seasonal allergies    Sinus problem    Sprain 10/25/2010   left knee   Trigeminal pulse    Trigger finger, right    Middle finger and right wrist    Vitamin D deficiency     Past Surgical History:  Procedure Laterality Date   APPENDECTOMY     CHOLECYSTECTOMY     DIAGNOSTIC LAPAROSCOPY  08/2006   DILATION AND CURETTAGE OF UTERUS  2011   attempted ablation   FUNCTIONAL ENDOSCOPIC SINUS SURGERY     ganglion cyst removal     HYSTEROSCOPY     failed   KNEE ARTHROPLASTY Right 2018   meniscus    KNEE CLOSED REDUCTION Left 08/18/2020   Procedure: LEFT KNEE MANIPULATION UNDER ANESTHESIA;  Surgeon: Leandrew Koyanagi, MD;  Location: [place];  Service: Orthopedics;  Laterality: Left;   LAPAROSCOPIC GASTRIC SLEEVE RESECTION  01/2020   LASIK     LIPOMA EXCISION Right 07/22/2014   Procedure: EXCISION RIGHT THIGH LIPOMA;  Surgeon: Erroll Luna, MD;  Location: [place];  Service: General;  Laterality: Right;   ROBOTIC ASSISTED TOTAL HYSTERECTOMY  11/06/10   TLH/RSO   SALPINGOOPHORECTOMY  11/06/2010   Procedure: SALPINGO OOPHERECTOMY;  Surgeon: Felipa Emory;  Location: [facility];  Service: Gynecology;  Laterality: Right;   TOTAL KNEE ARTHROPLASTY Left 05/23/2020   Procedure: LEFT TOTAL KNEE ARTHROPLASTY;  Surgeon: Leandrew Koyanagi, MD;  Location: [place];  Service: Orthopedics;  Laterality: Left;   TRIGGER FINGER RELEASE     UPPER GI ENDOSCOPY N/A 02/02/2020   Procedure: UPPER GI ENDOSCOPY;  Surgeon: [name], MD;  Location: [facility];  Service: General;  Laterality: N/A;    There were no vitals  filed for this visit.   Subjective Assessment - 10/13/20 1556     Subjective denies pain upon arrival, states her knee has been wierd today and is stiff at times with standing and walking and then other times she walks normal.    Patient is accompained by: Family member    Pertinent History s/p left knee manipulation on 08/18/2020, R knee scope 2018, Bariatric surgery, CHF    Limitations Sitting;House hold activities;Standing;Walking    How long can you sit comfortably? Stiffness with standing after prolonged sitting    How long can you stand comfortably? 20-30 minutes (was 10-15 minutes)    How long can you walk comfortably? 30 minutes (was 20-25 minutes)    Patient Stated Goals Walk better without the cane for yard work and return to work    Pain Onset More than a month ago                               [facility] Adult PT Treatment/Exercise - 10/13/20 0001        Knee/Hip Exercises: Stretches   Active Hamstring Stretch Left;3 reps;30 seconds    Active Hamstring Stretch Limitations standing with foot on treadmill    Gastroc Stretch Both;3 reps;30 seconds    Gastroc Stretch Limitations slantboard    Other Knee/Hip Stretches prone hang 5 min with 5#      Knee/Hip Exercises: Aerobic   Recumbent Bike Seat 5 for 10 minutes, L3      Knee/Hip Exercises: Machines for Strengthening   Total Gym Leg Press DL 143# 3 sets of 10 & 81# L leg only 3 sets of 10 slow eccentrics and push into full flexion/extension      Knee/Hip Exercises: Standing   Other Standing Knee Exercises TRX squats 2X10 holding 2-3 seconds, TRX lunges X10 bilat      Manual Therapy   Manual therapy comments palpation and active compression with DN              Trigger Point Dry Needling - 10/13/20 0001     Muscles Treated Lower Quadrant Quadriceps;Vastus lateralis;Peroneals;Gastrocnemius;Hamstring    Vastus lateralis Response Twitch response elicited    Peroneals Response Twitch response elicited    Hamstring Response Twitch response elicited    Gastrocnemius Response Twitch response elicited                    PT Short Term Goals - 08/16/20 1535       PT SHORT TERM GOAL #1   Title [name] will report independence and compliance with her starter HEP addressing AROM and quadriceps strength impairments.    Status Achieved               PT Long Term Goals - 09/30/20 0925       PT LONG TERM GOAL #1   Title Improve FOTO score to 59.    Status On-going    Target Date 10/28/20      PT LONG TERM GOAL #2   Title Improve L knee pain to consistently 0-3/10 on the Numeric Pain Rating Scale.    Status On-going    Target Date 10/28/20      PT LONG TERM GOAL #3   Title [name] will be able to walk 1/4 mile or more from her car to her office without an assistive device or complaints of functional difficulty.    Status Achieved    Target Date  10/28/20       PT LONG TERM GOAL #4   Title Improve L knee AROM for extension to -3 degrees or better and flexion to 110 degrees or better by DC.    Baseline -7 to 106 PROM 09/30/2020    Status On-going    Target Date 10/28/20      PT LONG TERM GOAL #5   Title [name] will be independent and compliant with her long-term HEP at DC.    Status Achieved                   Plan - 10/13/20 1640     Clinical Impression Statement Doing well with flexion but still very stiff with extension ROM, however she at least feels like it was intermittent stiffness today vs constant stiffness. She does always feel better and has more ROM after session paticularly after DN.    Personal Factors and Comorbidities Comorbidity 3+    Comorbidities Previous R knee scope in 2018, bariatric surgery, CHF    Examination-Activity Limitations Bathing;Dressing;Sit;Transfers;Bed Mobility;Sleep;Bend;Lift;Squat;Locomotion Level;Stairs;Carry;Stand    Examination-Participation Restrictions Interpersonal Relationship;Occupation;Yard Work;Community Activity    Stability/Clinical Decision Making Stable/Uncomplicated    Rehab Potential Good    PT Frequency 3x / week    PT Duration 4 weeks    PT Treatment/Interventions ADLs/Self Care Home Management;Electrical Stimulation;Cryotherapy;Therapeutic activities;Stair training;Gait training;Therapeutic exercise;Balance training;Neuromuscular re-education;Patient/family education;Manual techniques;Vasopneumatic Device    PT Next Visit Plan DN if desired. AROM focus (particularly) extension.    PT Home Exercise Plan Access Code: [email or web address]    Consulted and Agree with Plan of Care Patient             Patient will benefit from skilled therapeutic intervention in order to improve the following deficits and impairments:  Abnormal gait, Decreased activity tolerance, Decreased balance, Decreased endurance, Decreased range of motion, Decreased strength, Difficulty walking, Increased edema, Impaired  flexibility, Pain, Obesity  Visit Diagnosis: Difficulty walking  Stiffness of left knee, not elsewhere classified  Muscle weakness (generalized)  Chronic pain of left knee  Localized edema     Problem List Patient Active Problem List   Diagnosis Date Noted   Stiffness of left knee 08/18/2020   Status post total left knee replacement 05/23/2020   Primary osteoarthritis of left knee 05/05/2020   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   Fatty liver disease, nonalcoholic [id number]   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   Trigger finger, right middle finger 10/10/2018   Other insomnia 06/23/2018   Vitamin D deficiency 03/26/2018   Prediabetes 03/11/2018   Obesity 02/26/2018   Migraines 10/01/2016   Decreased cardiac ejection fraction 10/01/2016   Severe obesity ([place]) 10/01/2016   Shingles 08/03/2016   Chronic pain of right knee 05/24/2016   Unilateral primary osteoarthritis, right knee 05/24/2016   Posterior tibial tendinitis, right leg [id number]   Lichen sclerosus [id number]    Silvestre Mesi 10/13/2020, 4:41 PM  [facility] Physical Therapy [address] [place], [place], [address] Phone: [phone]   Fax:  [phone]  Name: JARRETT KORTAN MRN: [id number] Date of Birth: 14-Aug-1960

## 2020-10-18 ENCOUNTER — Other Ambulatory Visit: Payer: Self-pay

## 2020-10-18 ENCOUNTER — Ambulatory Visit (INDEPENDENT_AMBULATORY_CARE_PROVIDER_SITE_OTHER): Payer: BC Managed Care – PPO | Admitting: Physical Therapy

## 2020-10-18 DIAGNOSIS — M6281 Muscle weakness (generalized): Secondary | ICD-10-CM | POA: Diagnosis not present

## 2020-10-18 DIAGNOSIS — R600 Localized edema: Secondary | ICD-10-CM

## 2020-10-18 DIAGNOSIS — M25562 Pain in left knee: Secondary | ICD-10-CM | POA: Diagnosis not present

## 2020-10-18 DIAGNOSIS — M25662 Stiffness of left knee, not elsewhere classified: Secondary | ICD-10-CM

## 2020-10-18 DIAGNOSIS — G8929 Other chronic pain: Secondary | ICD-10-CM

## 2020-10-18 DIAGNOSIS — R262 Difficulty in walking, not elsewhere classified: Secondary | ICD-10-CM

## 2020-10-18 NOTE — Therapy (Signed)
[place], [place], [address] Phone: [phone]   Fax:  [phone]  Physical Therapy Treatment/Recert  Patient Details  Name: Savannah [name] MRN: [id number] Date of Birth: [birth year] Referring Provider (PT): Frankey Shown, MD   Encounter Date: 10/18/2020   PT End of Session - 10/18/20 1612     Visit Number 39    Number of Visits 45    Date for PT Re-Evaluation 11/04/20    Progress Note Due on Visit --    PT Start Time 0383    PT Stop Time 1640    PT Time Calculation (min) 45 min    Activity Tolerance Patient tolerated treatment well    Behavior During Therapy [facility] for tasks assessed/performed             Past Medical History:  Diagnosis Date   Arthritis    bilateral knees, recent LCL sprain-on left knee   CHF (congestive heart failure) ([place])    Dysrhythmia    trigem   Environmental allergies    Family history of adverse reaction to anesthesia    slow to wake up   Fatty liver    Headache(784.0)    migraines-on meds for control, relpax, ketoprophen, vicoden, muscle relaxer   Hearing loss 2016   High frequency hearing - Bilateral    History of hiatal hernia    Hot flashes    Joint pain    Lichen sclerosus    Lipoma    Right Knee   Menorrhagia    Migraines    Necrobiosis lipoidica    Osteoarthritis    Overweight    Pelvic pain in female    PONV (postoperative nausea and vomiting)    ponv, slow to awake   Seasonal allergies    Sinus problem    Sprain 10/25/2010   left knee   Trigeminal pulse    Trigger finger, right    Middle finger and right wrist    Vitamin D deficiency     Past Surgical History:  Procedure Laterality Date   APPENDECTOMY     CHOLECYSTECTOMY     DIAGNOSTIC LAPAROSCOPY  08/2006   DILATION AND CURETTAGE OF UTERUS  2011   attempted ablation   FUNCTIONAL ENDOSCOPIC SINUS SURGERY     ganglion cyst removal     HYSTEROSCOPY     failed   KNEE ARTHROPLASTY Right 2018    meniscus   KNEE CLOSED REDUCTION Left 08/18/2020   Procedure: LEFT KNEE MANIPULATION UNDER ANESTHESIA;  Surgeon: Leandrew Koyanagi, MD;  Location: [place];  Service: Orthopedics;  Laterality: Left;   LAPAROSCOPIC GASTRIC SLEEVE RESECTION  01/2020   LASIK     LIPOMA EXCISION Right 07/22/2014   Procedure: EXCISION RIGHT THIGH LIPOMA;  Surgeon: Erroll Luna, MD;  Location: [place];  Service: General;  Laterality: Right;   ROBOTIC ASSISTED TOTAL HYSTERECTOMY  11/06/10   TLH/RSO   SALPINGOOPHORECTOMY  11/06/2010   Procedure: SALPINGO OOPHERECTOMY;  Surgeon: Felipa Emory;  Location: [facility];  Service: Gynecology;  Laterality: Right;   TOTAL KNEE ARTHROPLASTY Left 05/23/2020   Procedure: LEFT TOTAL KNEE ARTHROPLASTY;  Surgeon: Leandrew Koyanagi, MD;  Location: [place];  Service: Orthopedics;  Laterality: Left;   TRIGGER FINGER RELEASE     UPPER GI ENDOSCOPY N/A 02/02/2020   Procedure: UPPER GI ENDOSCOPY;  Surgeon: [name], MD;  Location: [facility];  Service: General;  Laterality: N/A;    There were no vitals  filed for this visit.   Subjective Assessment - 10/18/20 1604     Subjective relays less overall pain and stiffness today upon arrival.    Patient is accompained by: Family member    Pertinent History s/p left knee manipulation on 08/18/2020, R knee scope 2018, Bariatric surgery, CHF    Limitations Sitting;House hold activities;Standing;Walking    How long can you sit comfortably? Stiffness with standing after prolonged sitting    How long can you stand comfortably? 20-30 minutes (was 10-15 minutes)    How long can you walk comfortably? 30 minutes (was 20-25 minutes)    Patient Stated Goals Walk better without the cane for yard work and return to work    Pain Onset More than a month ago                [facility] PT Assessment - 10/18/20 0001       Assessment   Medical Diagnosis s/p L TKA, s/p manipulation on 08/18/2020    Referring Provider (PT) Frankey Shown,  MD      AROM   Left Knee Extension -4    Left Knee Flexion 105      PROM   Left Knee Extension -2   after stretching and manual   Left Knee Flexion 110      Strength   Overall Strength Comments Lt knee strength 5/5 MMT                           [facility] Adult PT Treatment/Exercise - 10/18/20 0001       Knee/Hip Exercises: Stretches   Active Hamstring Stretch Left;3 reps;30 seconds    Active Hamstring Stretch Limitations standing with foot on treadmill    Knee: Self-Stretch Limitations tailgate stretch 10 sec X 1 min    Gastroc Stretch Both;3 reps;30 seconds    Gastroc Stretch Limitations slantboard    Other Knee/Hip Stretches prone hang 5 min with 5#      Knee/Hip Exercises: Aerobic   Recumbent Bike Seat 5 for 10 minutes, L4      Knee/Hip Exercises: Machines for Strengthening   Total Gym Leg Press DL 150# 3 sets of 10 & 81# L leg only 3 sets of 10 slow eccentrics and push into full flexion/extension      Knee/Hip Exercises: Standing   Other Standing Knee Exercises TRX squats X10 holding 5 seconds, TRX lunges X10 bilat      Manual Therapy   Manual therapy comments palpation and active compression with DN              Trigger Point Dry Needling - 10/18/20 0001     Muscles Treated Lower Quadrant Quadriceps;Vastus lateralis;Peroneals;Gastrocnemius;Hamstring    Vastus lateralis Response Twitch response elicited    Peroneals Response Twitch response elicited    Hamstring Response Twitch response elicited    Gastrocnemius Response Twitch response elicited                    PT Short Term Goals - 08/16/20 1535       PT SHORT TERM GOAL #1   Title Savannah [name] will report independence and compliance with her starter HEP addressing AROM and quadriceps strength impairments.    Status Achieved               PT Long Term Goals - 10/18/20 1623       PT LONG TERM GOAL #1   Title Improve FOTO score  to 78.    Status On-going      PT LONG TERM  GOAL #2   Title Improve L knee pain to consistently 0-3/10 on the Numeric Pain Rating Scale.    Baseline now met    Status Achieved      PT LONG TERM GOAL #3   Title Savannah [name] will be able to walk 1/4 mile or more from her car to her office without an assistive device or complaints of functional difficulty.    Baseline now met    Status Achieved      PT LONG TERM GOAL #4   Title Improve L knee AROM for extension to -3 degrees or better and flexion to 110 degrees or better by DC.    Baseline -5 to 110 PROM now    Status On-going      PT LONG TERM GOAL #5   Title Savannah [name] will be independent and compliant with her long-term HEP at DC.    Status Achieved                   Plan - 10/18/20 1620     Clinical Impression Statement Recert today as she was at the visit limit for last recert. She will see MD in 3 weeks so PT is recommending to continue PT 2 times per week for 3 more weeks until she sees MD to maximize her overall ROM and therefore function. She has done very well with pain and her strength but continues to lack ROM. This has slowly improved some with aggressive stretching, HEP, and DN.    Personal Factors and Comorbidities Comorbidity 3+    Comorbidities Previous R knee scope in 2018, bariatric surgery, CHF    Examination-Activity Limitations Bathing;Dressing;Sit;Transfers;Bed Mobility;Sleep;Bend;Lift;Squat;Locomotion Level;Stairs;Carry;Stand    Examination-Participation Restrictions Interpersonal Relationship;Occupation;Yard Work;Community Activity    Stability/Clinical Decision Making Stable/Uncomplicated    Rehab Potential Good    PT Frequency 3x / week    PT Duration 4 weeks    PT Treatment/Interventions ADLs/Self Care Home Management;Electrical Stimulation;Cryotherapy;Therapeutic activities;Stair training;Gait training;Therapeutic exercise;Balance training;Neuromuscular re-education;Patient/family education;Manual techniques;Vasopneumatic Device    PT Next Visit Plan DN  if desired. AROM focus (particularly) extension.    PT Home Exercise Plan Access Code: [email or web address]    Consulted and Agree with Plan of Care Patient             Patient will benefit from skilled therapeutic intervention in order to improve the following deficits and impairments:  Abnormal gait, Decreased activity tolerance, Decreased balance, Decreased endurance, Decreased range of motion, Decreased strength, Difficulty walking, Increased edema, Impaired flexibility, Pain, Obesity  Visit Diagnosis: Difficulty walking  Stiffness of left knee, not elsewhere classified  Muscle weakness (generalized)  Chronic pain of left knee  Localized edema     Problem List Patient Active Problem List   Diagnosis Date Noted   Stiffness of left knee 08/18/2020   Status post total left knee replacement 05/23/2020   Primary osteoarthritis of left knee 05/05/2020   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   Fatty liver disease, nonalcoholic 93/81/8299   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   Trigger finger, right middle finger 10/10/2018   Other insomnia 06/23/2018   Vitamin D deficiency 03/26/2018   Prediabetes 03/11/2018   Obesity 02/26/2018   Migraines 10/01/2016   Decreased cardiac ejection fraction 10/01/2016   Severe obesity ([place]) 10/01/2016   Shingles 08/03/2016   Chronic pain of right knee 05/24/2016   Unilateral primary osteoarthritis, right knee 05/24/2016   Posterior  tibial tendinitis, right leg 90/30/0923   Lichen sclerosus 30/08/6224    Silvestre Mesi 10/18/2020, 5:00 PM  [facility] Physical Therapy [address] [place], [place], [address] Phone: [phone]   Fax:  [phone]  Name: Savannah [name] MRN: [id number] Date of Birth: Jul 27, 1960

## 2020-10-19 ENCOUNTER — Encounter: Payer: BC Managed Care – PPO | Admitting: Physical Therapy

## 2020-10-21 ENCOUNTER — Other Ambulatory Visit: Payer: Self-pay

## 2020-10-21 ENCOUNTER — Telehealth: Payer: Self-pay | Admitting: Orthopaedic Surgery

## 2020-10-21 ENCOUNTER — Ambulatory Visit (INDEPENDENT_AMBULATORY_CARE_PROVIDER_SITE_OTHER): Payer: BC Managed Care – PPO | Admitting: Rehabilitative and Restorative Service Providers"

## 2020-10-21 ENCOUNTER — Encounter: Payer: Self-pay | Admitting: Rehabilitative and Restorative Service Providers"

## 2020-10-21 DIAGNOSIS — M25662 Stiffness of left knee, not elsewhere classified: Secondary | ICD-10-CM | POA: Diagnosis not present

## 2020-10-21 DIAGNOSIS — R262 Difficulty in walking, not elsewhere classified: Secondary | ICD-10-CM

## 2020-10-21 DIAGNOSIS — M25562 Pain in left knee: Secondary | ICD-10-CM | POA: Diagnosis not present

## 2020-10-21 DIAGNOSIS — R600 Localized edema: Secondary | ICD-10-CM

## 2020-10-21 DIAGNOSIS — M6281 Muscle weakness (generalized): Secondary | ICD-10-CM | POA: Diagnosis not present

## 2020-10-21 DIAGNOSIS — G8929 Other chronic pain: Secondary | ICD-10-CM

## 2020-10-21 NOTE — Telephone Encounter (Signed)
Pt returned call to Dr. Erlinda Hong. Pt asking for call back from Dr. Erlinda Hong or Kathlee Nations. Please call pt at [phone].

## 2020-10-21 NOTE — Telephone Encounter (Signed)
[place] calling on behalf of the pt. Pt had a few follow up questions for either Dr. Erlinda Hong or his nurse. The best call back number is [phone], ext [phone].

## 2020-10-21 NOTE — Therapy (Signed)
[facility] [address] [place], [place], [address] Phone: [phone]   Fax:  [phone]  Physical Therapy Treatment  Patient Details  Name: Savannah [name] MRN: [id number] Date of Birth: September 30, 1960 Referring Provider (PT): Frankey Shown, MD   Encounter Date: 10/21/2020   PT End of Session - 10/21/20 0810     Visit Number 40    Number of Visits 45    Date for PT Re-Evaluation 11/04/20    PT Start Time 0801    PT Stop Time 0841    PT Time Calculation (min) 40 min    Activity Tolerance Patient tolerated treatment well    Behavior During Therapy [facility] for tasks assessed/performed             Past Medical History:  Diagnosis Date   Arthritis    bilateral knees, recent LCL sprain-on left knee   CHF (congestive heart failure) ([place])    Dysrhythmia    trigem   Environmental allergies    Family history of adverse reaction to anesthesia    slow to wake up   Fatty liver    Headache(784.0)    migraines-on meds for control, relpax, ketoprophen, vicoden, muscle relaxer   Hearing loss 2016   High frequency hearing - Bilateral    History of hiatal hernia    Hot flashes    Joint pain    Lichen sclerosus    Lipoma    Right Knee   Menorrhagia    Migraines    Necrobiosis lipoidica    Osteoarthritis    Overweight    Pelvic pain in female    PONV (postoperative nausea and vomiting)    ponv, slow to awake   Seasonal allergies    Sinus problem    Sprain 10/25/2010   left knee   Trigeminal pulse    Trigger finger, right    Middle finger and right wrist    Vitamin D deficiency     Past Surgical History:  Procedure Laterality Date   APPENDECTOMY     CHOLECYSTECTOMY     DIAGNOSTIC LAPAROSCOPY  08/2006   DILATION AND CURETTAGE OF UTERUS  2011   attempted ablation   FUNCTIONAL ENDOSCOPIC SINUS SURGERY     ganglion cyst removal     HYSTEROSCOPY     failed   KNEE ARTHROPLASTY Right 2018   meniscus   KNEE CLOSED REDUCTION Left 08/18/2020    Procedure: LEFT KNEE MANIPULATION UNDER ANESTHESIA;  Surgeon: Leandrew Koyanagi, MD;  Location: [place];  Service: Orthopedics;  Laterality: Left;   LAPAROSCOPIC GASTRIC SLEEVE RESECTION  01/2020   LASIK     LIPOMA EXCISION Right 07/22/2014   Procedure: EXCISION RIGHT THIGH LIPOMA;  Surgeon: Erroll Luna, MD;  Location: [place];  Service: General;  Laterality: Right;   ROBOTIC ASSISTED TOTAL HYSTERECTOMY  11/06/10   TLH/RSO   SALPINGOOPHORECTOMY  11/06/2010   Procedure: SALPINGO OOPHERECTOMY;  Surgeon: Felipa Emory;  Location: [facility];  Service: Gynecology;  Laterality: Right;   TOTAL KNEE ARTHROPLASTY Left 05/23/2020   Procedure: LEFT TOTAL KNEE ARTHROPLASTY;  Surgeon: Leandrew Koyanagi, MD;  Location: [place];  Service: Orthopedics;  Laterality: Left;   TRIGGER FINGER RELEASE     UPPER GI ENDOSCOPY N/A 02/02/2020   Procedure: UPPER GI ENDOSCOPY;  Surgeon: [name], MD;  Location[facility];  Service: General;  Laterality: N/A;    There were no vitals filed for this visit.   Subjective Assessment -  10/21/20 0810     Subjective Pt. indicated no specific pain today, just tightness.    Patient is accompained by: Family member    Pertinent History s/p left knee manipulation on 08/18/2020, R knee scope 2018, Bariatric surgery, CHF    Limitations Sitting;House hold activities;Standing;Walking    How long can you sit comfortably? Stiffness with standing after prolonged sitting    How long can you stand comfortably? 20-30 minutes (was 10-15 minutes)    How long can you walk comfortably? 30 minutes (was 20-25 minutes)    Patient Stated Goals Walk better without the cane for yard work and return to work    Currently in Pain? No/denies    Pain Score 0-No pain    Pain Onset More than a month ago                               [facility] Adult PT Treatment/Exercise - 10/21/20 0001       Knee/Hip Exercises: Stretches   Passive Hamstring Stretch 30  seconds;Left   on shuttle leg press between sets   Gastroc Stretch 30 seconds;3 reps;Both    Gastroc Stretch Limitations slantboard    Other Knee/Hip Stretches prone hang 5 min with 5#      Knee/Hip Exercises: Aerobic   Nustep Lvl 6 6 mins UE/LE      Knee/Hip Exercises: Machines for Strengthening   Total Gym Leg Press Double leg 150 lbs 3 x 10, Single leg Lt 81 lbs 3 x 10 (hamstring stretch between)      Knee/Hip Exercises: Standing   Lateral Step Up Step Height: 6";Left   eccentric lowering focus 3 x 10   Other Standing Knee Exercises held squats today for eccentric step down                       PT Short Term Goals - 08/16/20 1535       PT SHORT TERM GOAL #1   Title [name] will report independence and compliance with her starter HEP addressing AROM and quadriceps strength impairments.    Status Achieved               PT Long Term Goals - 10/18/20 1623       PT LONG TERM GOAL #1   Title Improve FOTO score to 59.    Status On-going      PT LONG TERM GOAL #2   Title Improve L knee pain to consistently 0-3/10 on the Numeric Pain Rating Scale.    Baseline now met    Status Achieved      PT LONG TERM GOAL #3   Title [name] will be able to walk 1/4 mile or more from her car to her office without an assistive device or complaints of functional difficulty.    Baseline now met    Status Achieved      PT LONG TERM GOAL #4   Title Improve L knee AROM for extension to -3 degrees or better and flexion to 110 degrees or better by DC.    Baseline -5 to 110 PROM now    Status On-going      PT LONG TERM GOAL #5   Title [name] will be independent and compliant with her long-term HEP at DC.    Status Achieved                   Plan -  10/21/20 0835     Clinical Impression Statement Continued plan on active and passive extension gains while in clinic and also included in HEP at this time.  Pt. demonstrated good awareness in HEP plan at this time.  Plan to  continue toward goals per most recent recertification plan.    Personal Factors and Comorbidities Comorbidity 3+    Comorbidities Previous R knee scope in 2018, bariatric surgery, CHF    Examination-Activity Limitations Bathing;Dressing;Sit;Transfers;Bed Mobility;Sleep;Bend;Lift;Squat;Locomotion Level;Stairs;Carry;Stand    Examination-Participation Restrictions Interpersonal Relationship;Occupation;Yard Work;Community Activity    Stability/Clinical Decision Making Stable/Uncomplicated    Rehab Potential Good    PT Frequency 3x / week    PT Duration 4 weeks    PT Treatment/Interventions ADLs/Self Care Home Management;Electrical Stimulation;Cryotherapy;Therapeutic activities;Stair training;Gait training;Therapeutic exercise;Balance training;Neuromuscular re-education;Patient/family education;Manual techniques;Vasopneumatic Device    PT Next Visit Plan DN if desired. AROM focus (particularly) extension, eccentric control in WB    PT Home Exercise Plan Access Code: [email or web address]    Consulted and Agree with Plan of Care Patient             Patient will benefit from skilled therapeutic intervention in order to improve the following deficits and impairments:  Abnormal gait, Decreased activity tolerance, Decreased balance, Decreased endurance, Decreased range of motion, Decreased strength, Difficulty walking, Increased edema, Impaired flexibility, Pain, Obesity  Visit Diagnosis: Difficulty walking  Muscle weakness (generalized)  Stiffness of left knee, not elsewhere classified  Chronic pain of left knee  Localized edema     Problem List Patient Active Problem List   Diagnosis Date Noted   Stiffness of left knee 08/18/2020   Status post total left knee replacement 05/23/2020   Primary osteoarthritis of left knee 05/05/2020   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   Fatty liver disease, nonalcoholic 52/09/221   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   Trigger finger,  right middle finger 10/10/2018   Other insomnia 06/23/2018   Vitamin D deficiency 03/26/2018   Prediabetes 03/11/2018   Obesity 02/26/2018   Migraines 10/01/2016   Decreased cardiac ejection fraction 10/01/2016   Severe obesity ([place]) 10/01/2016   Shingles 08/03/2016   Chronic pain of right knee 05/24/2016   Unilateral primary osteoarthritis, right knee 05/24/2016   Posterior tibial tendinitis, right leg 36/01/2448   Lichen sclerosus 75/30/0511    Scot Jun, PT, DPT, OCS, ATC 10/21/20  8:39 AM    [facility] Physical Therapy [address] [place], [place], [address] Phone: [phone]   Fax:  [phone]  Name: MINDEE ROBLEDO MRN: [id number] Date of Birth: [birth year]

## 2020-10-23 NOTE — Telephone Encounter (Signed)
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  Thanks.

## 2020-10-24 ENCOUNTER — Other Ambulatory Visit: Payer: Self-pay

## 2020-10-24 ENCOUNTER — Ambulatory Visit (INDEPENDENT_AMBULATORY_CARE_PROVIDER_SITE_OTHER): Payer: BC Managed Care – PPO | Admitting: Physical Therapy

## 2020-10-24 DIAGNOSIS — G8929 Other chronic pain: Secondary | ICD-10-CM

## 2020-10-24 DIAGNOSIS — R600 Localized edema: Secondary | ICD-10-CM

## 2020-10-24 DIAGNOSIS — R262 Difficulty in walking, not elsewhere classified: Secondary | ICD-10-CM | POA: Diagnosis not present

## 2020-10-24 DIAGNOSIS — M6281 Muscle weakness (generalized): Secondary | ICD-10-CM | POA: Diagnosis not present

## 2020-10-24 DIAGNOSIS — M25562 Pain in left knee: Secondary | ICD-10-CM

## 2020-10-24 DIAGNOSIS — M25662 Stiffness of left knee, not elsewhere classified: Secondary | ICD-10-CM

## 2020-10-24 NOTE — Telephone Encounter (Signed)
Called patient no answer. LMOM  Also sent her Mychart msg.

## 2020-10-24 NOTE — Therapy (Signed)
[facility] Therapy [address] [place], [place], [address] Phone: [phone]   Fax:  [phone]  Physical Therapy Treatment  Patient Details  Name: Savannah [name] MRN: [id number] Date of Birth: Jul 03, 1960 Referring Provider (PT): Frankey Shown, MD   Encounter Date: 10/24/2020   PT End of Session - 10/24/20 0939     Visit Number 41    Number of Visits 54    Date for PT Re-Evaluation 11/04/20    Progress Note Due on Visit 10    PT Start Time 0845    PT Stop Time 0935    PT Time Calculation (min) 50 min    Activity Tolerance Patient tolerated treatment well    Behavior During Therapy [facility] for tasks assessed/performed             Past Medical History:  Diagnosis Date   Arthritis    bilateral knees, recent LCL sprain-on left knee   CHF (congestive heart failure) ([place])    Dysrhythmia    trigem   Environmental allergies    Family history of adverse reaction to anesthesia    slow to wake up   Fatty liver    Headache(784.0)    migraines-on meds for control, relpax, ketoprophen, vicoden, muscle relaxer   Hearing loss 2016   High frequency hearing - Bilateral    History of hiatal hernia    Hot flashes    Joint pain    Lichen sclerosus    Lipoma    Right Knee   Menorrhagia    Migraines    Necrobiosis lipoidica    Osteoarthritis    Overweight    Pelvic pain in female    PONV (postoperative nausea and vomiting)    ponv, slow to awake   Seasonal allergies    Sinus problem    Sprain 10/25/2010   left knee   Trigeminal pulse    Trigger finger, right    Middle finger and right wrist    Vitamin D deficiency     Past Surgical History:  Procedure Laterality Date   APPENDECTOMY     CHOLECYSTECTOMY     DIAGNOSTIC LAPAROSCOPY  08/2006   DILATION AND CURETTAGE OF UTERUS  2011   attempted ablation   FUNCTIONAL ENDOSCOPIC SINUS SURGERY     ganglion cyst removal     HYSTEROSCOPY     failed   KNEE ARTHROPLASTY Right 2018   meniscus    KNEE CLOSED REDUCTION Left 08/18/2020   Procedure: LEFT KNEE MANIPULATION UNDER ANESTHESIA;  Surgeon: Leandrew Koyanagi, MD;  Location: [place];  Service: Orthopedics;  Laterality: Left;   LAPAROSCOPIC GASTRIC SLEEVE RESECTION  01/2020   LASIK     LIPOMA EXCISION Right 07/22/2014   Procedure: EXCISION RIGHT THIGH LIPOMA;  Surgeon: Erroll Luna, MD;  Location: [place];  Service: General;  Laterality: Right;   ROBOTIC ASSISTED TOTAL HYSTERECTOMY  11/06/10   TLH/RSO   SALPINGOOPHORECTOMY  11/06/2010   Procedure: SALPINGO OOPHERECTOMY;  Surgeon: Felipa Emory;  Location: [place] [facility];  Service: Gynecology;  Laterality: Right;   TOTAL KNEE ARTHROPLASTY Left 05/23/2020   Procedure: LEFT TOTAL KNEE ARTHROPLASTY;  Surgeon: Leandrew Koyanagi, MD;  Location: [place];  Service: Orthopedics;  Laterality: Left;   TRIGGER FINGER RELEASE     UPPER GI ENDOSCOPY N/A 02/02/2020   Procedure: UPPER GI ENDOSCOPY;  Surgeon: [name], MD;  Location: [facility];  Service: General;  Laterality: N/A;    There were no vitals  filed for this visit.   Subjective Assessment - 10/24/20 0909     Subjective Pt denies pain, she still complains of on and off knee stiffness    Patient is accompained by: Family member    Pertinent History s/p left knee manipulation on 08/18/2020, R knee scope 2018, Bariatric surgery, CHF    Limitations Sitting;House hold activities;Standing;Walking    How long can you sit comfortably? Stiffness with standing after prolonged sitting    How long can you stand comfortably? 20-30 minutes (was 10-15 minutes)    How long can you walk comfortably? 30 minutes (was 20-25 minutes)    Patient Stated Goals Walk better without the cane for yard work and return to work    Pain Onset More than a month ago                               [facility] Adult PT Treatment/Exercise - 10/24/20 0001       Knee/Hip Exercises: Stretches   Active Hamstring Stretch Left;3 reps;30  seconds    Active Hamstring Stretch Limitations standing with foot on treadmill    Knee: Self-Stretch Limitations tailgate stretch 10 sec X 10 reps    Gastroc Stretch 30 seconds;3 reps;Both    Gastroc Stretch Limitations slantboard    Other Knee/Hip Stretches prone hang 5 min with 5#      Knee/Hip Exercises: Aerobic   Recumbent Bike Seat 5 for 10 minutes, L3      Knee/Hip Exercises: Machines for Strengthening   Total Gym Leg Press Double leg 150 lbs 3 x 12, Single leg Lt 81 lbs 3 x 12      Knee/Hip Exercises: Standing   Forward Step Up Limitations step up with left and down in front with right for knee flexion stretching and eccentric strengthening X 20 reps 6 inch step no UE support      Manual Therapy   Manual therapy comments palpation and active compression with DN              Trigger Point Dry Needling - 10/24/20 0001     Muscles Treated Lower Quadrant Quadriceps;Vastus lateralis;Peroneals;Gastrocnemius;Hamstring    Vastus lateralis Response Twitch response elicited    Peroneals Response Twitch response elicited    Hamstring Response Twitch response elicited    Gastrocnemius Response Twitch response elicited                     PT Short Term Goals - 08/16/20 1535       PT SHORT TERM GOAL #1   Title Savannah [name] will report independence and compliance with her starter HEP addressing AROM and quadriceps strength impairments.    Status Achieved               PT Long Term Goals - 10/18/20 1623       PT LONG TERM GOAL #1   Title Improve FOTO score to 59.    Status On-going      PT LONG TERM GOAL #2   Title Improve L knee pain to consistently 0-3/10 on the Numeric Pain Rating Scale.    Baseline now met    Status Achieved      PT LONG TERM GOAL #3   Title Savannah [name] will be able to walk 1/4 mile or more from her car to her office without an assistive device or complaints of functional difficulty.    Baseline now met  Status Achieved      PT LONG TERM  GOAL #4   Title Improve L knee AROM for extension to -3 degrees or better and flexion to 110 degrees or better by DC.    Baseline -5 to 110 PROM now    Status On-going      PT LONG TERM GOAL #5   Title Savannah [name] will be independent and compliant with her long-term HEP at DC.    Status Achieved                   Plan - 10/24/20 0941     Clinical Impression Statement Continued with ROM focus as this continues to be her biggest functional deficit. This does appear to be slowly improving however with aggressive stretching and DN. We will continue to work to improve this as much as possible.    Personal Factors and Comorbidities Comorbidity 3+    Comorbidities Previous R knee scope in 2018, bariatric surgery, CHF    Examination-Activity Limitations Bathing;Dressing;Sit;Transfers;Bed Mobility;Sleep;Bend;Lift;Squat;Locomotion Level;Stairs;Carry;Stand    Examination-Participation Restrictions Interpersonal Relationship;Occupation;Yard Work;Community Activity    Stability/Clinical Decision Making Stable/Uncomplicated    Rehab Potential Good    PT Frequency 3x / week    PT Duration 4 weeks    PT Treatment/Interventions ADLs/Self Care Home Management;Electrical Stimulation;Cryotherapy;Therapeutic activities;Stair training;Gait training;Therapeutic exercise;Balance training;Neuromuscular re-education;Patient/family education;Manual techniques;Vasopneumatic Device    PT Next Visit Plan DN if desired. AROM focus (particularly) extension, eccentric control in WB    PT Home Exercise Plan Access Code: [email or web address]    Consulted and Agree with Plan of Care Patient             Patient will benefit from skilled therapeutic intervention in order to improve the following deficits and impairments:  Abnormal gait, Decreased activity tolerance, Decreased balance, Decreased endurance, Decreased range of motion, Decreased strength, Difficulty walking, Increased edema, Impaired flexibility, Pain,  Obesity  Visit Diagnosis: Difficulty walking  Muscle weakness (generalized)  Stiffness of left knee, not elsewhere classified  Chronic pain of left knee  Localized edema     Problem List Patient Active Problem List   Diagnosis Date Noted   Stiffness of left knee 08/18/2020   Status post total left knee replacement 05/23/2020   Primary osteoarthritis of left knee 05/05/2020   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   Fatty liver disease, nonalcoholic 15/17/6160   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   Trigger finger, right middle finger 10/10/2018   Other insomnia 06/23/2018   Vitamin D deficiency 03/26/2018   Prediabetes 03/11/2018   Obesity 02/26/2018   Migraines 10/01/2016   Decreased cardiac ejection fraction 10/01/2016   Severe obesity ([place]) 10/01/2016   Shingles 08/03/2016   Chronic pain of right knee 05/24/2016   Unilateral primary osteoarthritis, right knee 05/24/2016   Posterior tibial tendinitis, right leg 73/71/0626   Lichen sclerosus 94/85/4627    Debbe Odea, PT,DPT 10/24/2020, 9:42 AM  [facility] Physical Therapy [address] [place], [place], [address] Phone: [phone]   Fax:  [phone]  Name: Savannah [name] MRN: [id number] Date of Birth: 1960-05-07

## 2020-10-26 ENCOUNTER — Ambulatory Visit (INDEPENDENT_AMBULATORY_CARE_PROVIDER_SITE_OTHER): Payer: BC Managed Care – PPO | Admitting: Physical Therapy

## 2020-10-26 ENCOUNTER — Other Ambulatory Visit: Payer: Self-pay

## 2020-10-26 DIAGNOSIS — R262 Difficulty in walking, not elsewhere classified: Secondary | ICD-10-CM | POA: Diagnosis not present

## 2020-10-26 DIAGNOSIS — M25562 Pain in left knee: Secondary | ICD-10-CM | POA: Diagnosis not present

## 2020-10-26 DIAGNOSIS — M25662 Stiffness of left knee, not elsewhere classified: Secondary | ICD-10-CM | POA: Diagnosis not present

## 2020-10-26 DIAGNOSIS — G8929 Other chronic pain: Secondary | ICD-10-CM

## 2020-10-26 DIAGNOSIS — R600 Localized edema: Secondary | ICD-10-CM

## 2020-10-26 DIAGNOSIS — M6281 Muscle weakness (generalized): Secondary | ICD-10-CM | POA: Diagnosis not present

## 2020-10-26 NOTE — Therapy (Signed)
[facility] Physical Therapy [address] [place], [place], [address] Phone: [phone]   Fax:  [phone]  Physical Therapy Treatment  Patient Details  Name: Savannah [name] MRN: [id number] Date of Birth: 1960/04/01 Referring Provider (PT): Frankey Shown, MD   Encounter Date: 10/26/2020   PT End of Session - 10/26/20 1640     Visit Number 42    Number of Visits 45    Date for PT Re-Evaluation 11/04/20    Progress Note Due on Visit 95    PT Start Time 3748    PT Stop Time 1638    PT Time Calculation (min) 48 min    Activity Tolerance Patient tolerated treatment well    Behavior During Therapy [facility] for tasks assessed/performed             Past Medical History:  Diagnosis Date   Arthritis    bilateral knees, recent LCL sprain-on left knee   CHF (congestive heart failure) ([place])    Dysrhythmia    trigem   Environmental allergies    Family history of adverse reaction to anesthesia    slow to wake up   Fatty liver    Headache(784.0)    migraines-on meds for control, relpax, ketoprophen, vicoden, muscle relaxer   Hearing loss 2016   High frequency hearing - Bilateral    History of hiatal hernia    Hot flashes    Joint pain    Lichen sclerosus    Lipoma    Right Knee   Menorrhagia    Migraines    Necrobiosis lipoidica    Osteoarthritis    Overweight    Pelvic pain in female    PONV (postoperative nausea and vomiting)    ponv, slow to awake   Seasonal allergies    Sinus problem    Sprain 10/25/2010   left knee   Trigeminal pulse    Trigger finger, right    Middle finger and right wrist    Vitamin D deficiency     Past Surgical History:  Procedure Laterality Date   APPENDECTOMY     CHOLECYSTECTOMY     DIAGNOSTIC LAPAROSCOPY  08/2006   DILATION AND CURETTAGE OF UTERUS  2011   attempted ablation   FUNCTIONAL ENDOSCOPIC SINUS SURGERY     ganglion cyst removal     HYSTEROSCOPY     failed   KNEE ARTHROPLASTY Right 2018   meniscus    KNEE CLOSED REDUCTION Left 08/18/2020   Procedure: LEFT KNEE MANIPULATION UNDER ANESTHESIA;  Surgeon: Leandrew Koyanagi, MD;  Location: [place];  Service: Orthopedics;  Laterality: Left;   LAPAROSCOPIC GASTRIC SLEEVE RESECTION  01/2020   LASIK     LIPOMA EXCISION Right 07/22/2014   Procedure: EXCISION RIGHT THIGH LIPOMA;  Surgeon: Erroll Luna, MD;  Location: [place];  Service: General;  Laterality: Right;   ROBOTIC ASSISTED TOTAL HYSTERECTOMY  11/06/10   TLH/RSO   SALPINGOOPHORECTOMY  11/06/2010   Procedure: SALPINGO OOPHERECTOMY;  Surgeon: Felipa Emory;  Location: [place] [place] [facility];  Service: Gynecology;  Laterality: Right;   TOTAL KNEE ARTHROPLASTY Left 05/23/2020   Procedure: LEFT TOTAL KNEE ARTHROPLASTY;  Surgeon: Leandrew Koyanagi, MD;  Location: [place];  Service: Orthopedics;  Laterality: Left;   TRIGGER FINGER RELEASE     UPPER GI ENDOSCOPY N/A 02/02/2020   Procedure: UPPER GI ENDOSCOPY;  Surgeon: [name], MD;  Location[facility];  Service: General;  Laterality: N/A;    There were no vitals  filed for this visit.   Subjective Assessment - 10/26/20 1608     Subjective no pain to report but is feeling very stiff today in her Lt knee    Patient is accompained by: Family member    Pertinent History s/p left knee manipulation on 08/18/2020, R knee scope 2018, Bariatric surgery, CHF    Limitations Sitting;House hold activities;Standing;Walking    How long can you sit comfortably? Stiffness with standing after prolonged sitting    How long can you stand comfortably? 20-30 minutes (was 10-15 minutes)    How long can you walk comfortably? 30 minutes (was 20-25 minutes)    Patient Stated Goals Walk better without the cane for yard work and return to work    Pain Onset More than a month ago                               [facility] Adult PT Treatment/Exercise - 10/26/20 0001       Knee/Hip Exercises: Stretches   Active Hamstring Stretch 3 reps;Left;30  seconds    Active Hamstring Stretch Limitations at leg press    Quad Stretch Left;3 reps;60 seconds    Quad Stretch Limitations prone with strap    Knee: Self-Stretch Limitations quadriped knee flexion stretch 30 sec X3    Gastroc Stretch 30 seconds;3 reps;Both    Gastroc Stretch Limitations slantboard    Other Knee/Hip Stretches --      Knee/Hip Exercises: Aerobic   Nustep L7 X10 min UE/LE      Knee/Hip Exercises: Machines for Strengthening   Total Gym Leg Press Double leg 156 lbs 3 x 10, Single leg Lt 81 lbs 3 x 10      Knee/Hip Exercises: Standing   Lateral Step Up Limitations 8 inch step up and over X10 bilat on UE support    Forward Step Up Limitations step up with left and down in front with right for knee flexion stretching and eccentric strengthening X 20 reps 8 inch step no UE support    Other Standing Knee Exercises deep squats with butt touch to low mat table 2X10      Manual Therapy   Manual therapy comments PROM flexion and extension, extension mobs and flexion mobs, patella mobs                       PT Short Term Goals - 08/16/20 1535       PT SHORT TERM GOAL #1   Title Savannah [name] will report independence and compliance with her starter HEP addressing AROM and quadriceps strength impairments.    Status Achieved               PT Long Term Goals - 10/18/20 1623       PT LONG TERM GOAL #1   Title Improve FOTO score to 59.    Status On-going      PT LONG TERM GOAL #2   Title Improve L knee pain to consistently 0-3/10 on the Numeric Pain Rating Scale.    Baseline now met    Status Achieved      PT LONG TERM GOAL #3   Title Savannah [name] will be able to walk 1/4 mile or more from her car to her office without an assistive device or complaints of functional difficulty.    Baseline now met    Status Achieved      PT LONG TERM GOAL #  4   Title Improve L knee AROM for extension to -3 degrees or better and flexion to 110 degrees or better by DC.     Baseline -5 to 110 PROM now    Status On-going      PT LONG TERM GOAL #5   Title Savannah [name] will be independent and compliant with her long-term HEP at DC.    Status Achieved                   Plan - 10/26/20 1620     Clinical Impression Statement Agressive stretching and ROM focus again was the main goal of session today. She is doing very well with pain levels and overall knee strength. She has 2 visits left then will follow up with MD.    Personal Factors and Comorbidities Comorbidity 3+    Comorbidities Previous R knee scope in 2018, bariatric surgery, CHF    Examination-Activity Limitations Bathing;Dressing;Sit;Transfers;Bed Mobility;Sleep;Bend;Lift;Squat;Locomotion Level;Stairs;Carry;Stand    Examination-Participation Restrictions Interpersonal Relationship;Occupation;Yard Work;Community Activity    Stability/Clinical Decision Making Stable/Uncomplicated    Rehab Potential Good    PT Frequency 3x / week    PT Duration 4 weeks    PT Treatment/Interventions ADLs/Self Care Home Management;Electrical Stimulation;Cryotherapy;Therapeutic activities;Stair training;Gait training;Therapeutic exercise;Balance training;Neuromuscular re-education;Patient/family education;Manual techniques;Vasopneumatic Device    PT Next Visit Plan DN if desired. agressive ROM focus    PT Home Exercise Plan Access Code: [email or web address]    Consulted and Agree with Plan of Care Patient             Patient will benefit from skilled therapeutic intervention in order to improve the following deficits and impairments:  Abnormal gait, Decreased activity tolerance, Decreased balance, Decreased endurance, Decreased range of motion, Decreased strength, Difficulty walking, Increased edema, Impaired flexibility, Pain, Obesity  Visit Diagnosis: Difficulty walking  Muscle weakness (generalized)  Stiffness of left knee, not elsewhere classified  Chronic pain of left knee  Localized edema     Problem  List Patient Active Problem List   Diagnosis Date Noted   Stiffness of left knee 08/18/2020   Status post total left knee replacement 05/23/2020   Primary osteoarthritis of left knee 05/05/2020   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   Fatty liver disease, nonalcoholic 02/33/4356   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   Trigger finger, right middle finger 10/10/2018   Other insomnia 06/23/2018   Vitamin D deficiency 03/26/2018   Prediabetes 03/11/2018   Obesity 02/26/2018   Migraines 10/01/2016   Decreased cardiac ejection fraction 10/01/2016   Severe obesity ([place]) 10/01/2016   Shingles 08/03/2016   Chronic pain of right knee 05/24/2016   Unilateral primary osteoarthritis, right knee 05/24/2016   Posterior tibial tendinitis, right leg 86/16/8372   Lichen sclerosus 90/21/1155    Debbe Odea, PT,DPT 10/26/2020, 4:43 PM  [facility] Physical Therapy [address] [place], [place], [address] Phone: [phone]   Fax:  [phone]  Name: Savannah [name] MRN: [id number] Date of Birth: 01-Jan-1961

## 2020-10-27 NOTE — Telephone Encounter (Signed)
Savannah [name] would you be able to write a letter to her employer stating that she has needed to work from home during this recovery time because of the reasons that she stated in her message.  She may return back to the office on Monday, 10/31/2020.  Thank you

## 2020-10-31 ENCOUNTER — Ambulatory Visit (INDEPENDENT_AMBULATORY_CARE_PROVIDER_SITE_OTHER): Payer: BC Managed Care – PPO | Admitting: Physical Therapy

## 2020-10-31 ENCOUNTER — Other Ambulatory Visit: Payer: Self-pay

## 2020-10-31 DIAGNOSIS — M25562 Pain in left knee: Secondary | ICD-10-CM

## 2020-10-31 DIAGNOSIS — M25662 Stiffness of left knee, not elsewhere classified: Secondary | ICD-10-CM | POA: Diagnosis not present

## 2020-10-31 DIAGNOSIS — R262 Difficulty in walking, not elsewhere classified: Secondary | ICD-10-CM | POA: Diagnosis not present

## 2020-10-31 DIAGNOSIS — R600 Localized edema: Secondary | ICD-10-CM

## 2020-10-31 DIAGNOSIS — M6281 Muscle weakness (generalized): Secondary | ICD-10-CM

## 2020-10-31 DIAGNOSIS — G8929 Other chronic pain: Secondary | ICD-10-CM

## 2020-11-01 NOTE — Telephone Encounter (Signed)
We will send to employer. Dr. Erlinda Hong left paperwork at home. He will bring tomorrow and then we can send to employer

## 2020-11-01 NOTE — Therapy (Signed)
[facility] Physical Therapy [address] [place], [place], [address] Phone: [phone]   Fax:  [phone]  Physical Therapy Treatment  Patient Details  Name: Savannah [name] MRN: [id number] Date of Birth: 07/29/1960 Referring Provider (PT): Frankey Shown, MD   Encounter Date: 10/31/2020   PT End of Session - 11/01/20 0815     Visit Number 43    Number of Visits 45    Date for PT Re-Evaluation 11/04/20    Progress Note Due on Visit 58    PT Start Time 1605    PT Stop Time 1648    PT Time Calculation (min) 43 min    Activity Tolerance Patient tolerated treatment well    Behavior During Therapy [facility] for tasks assessed/performed             Past Medical History:  Diagnosis Date   Arthritis    bilateral knees, recent LCL sprain-on left knee   CHF (congestive heart failure) ([place])    Dysrhythmia    trigem   Environmental allergies    Family history of adverse reaction to anesthesia    slow to wake up   Fatty liver    Headache(784.0)    migraines-on meds for control, relpax, ketoprophen, vicoden, muscle relaxer   Hearing loss 2016   High frequency hearing - Bilateral    History of hiatal hernia    Hot flashes    Joint pain    Lichen sclerosus    Lipoma    Right Knee   Menorrhagia    Migraines    Necrobiosis lipoidica    Osteoarthritis    Overweight    Pelvic pain in female    PONV (postoperative nausea and vomiting)    ponv, slow to awake   Seasonal allergies    Sinus problem    Sprain 10/25/2010   left knee   Trigeminal pulse    Trigger finger, right    Middle finger and right wrist    Vitamin D deficiency     Past Surgical History:  Procedure Laterality Date   APPENDECTOMY     CHOLECYSTECTOMY     DIAGNOSTIC LAPAROSCOPY  08/2006   DILATION AND CURETTAGE OF UTERUS  2011   attempted ablation   FUNCTIONAL ENDOSCOPIC SINUS SURGERY     ganglion cyst removal     HYSTEROSCOPY     failed   KNEE ARTHROPLASTY Right 2018   meniscus    KNEE CLOSED REDUCTION Left 08/18/2020   Procedure: LEFT KNEE MANIPULATION UNDER ANESTHESIA;  Surgeon: Leandrew Koyanagi, MD;  Location: [place];  Service: Orthopedics;  Laterality: Left;   LAPAROSCOPIC GASTRIC SLEEVE RESECTION  01/2020   LASIK     LIPOMA EXCISION Right 07/22/2014   Procedure: EXCISION RIGHT THIGH LIPOMA;  Surgeon: Erroll Luna, MD;  Location: [place];  Service: General;  Laterality: Right;   ROBOTIC ASSISTED TOTAL HYSTERECTOMY  11/06/10   TLH/RSO   SALPINGOOPHORECTOMY  11/06/2010   Procedure: SALPINGO OOPHERECTOMY;  Surgeon: Felipa Emory;  Location: [facility];  Service: Gynecology;  Laterality: Right;   TOTAL KNEE ARTHROPLASTY Left 05/23/2020   Procedure: LEFT TOTAL KNEE ARTHROPLASTY;  Surgeon: Leandrew Koyanagi, MD;  Location: [place];  Service: Orthopedics;  Laterality: Left;   TRIGGER FINGER RELEASE     UPPER GI ENDOSCOPY N/A 02/02/2020   Procedure: UPPER GI ENDOSCOPY;  Surgeon: [name], MD;  Location: [facility];  Service: General;  Laterality: N/A;    There were no vitals  filed for this visit.   Subjective Assessment - 11/01/20 0812     Subjective She started back to work so having more stiffness, soreness and is very tired today. Denies pain    Patient is accompained by: Family member    Pertinent History s/p left knee manipulation on 08/18/2020, R knee scope 2018, Bariatric surgery, CHF    Limitations Sitting;House hold activities;Standing;Walking    How long can you sit comfortably? Stiffness with standing after prolonged sitting    How long can you stand comfortably? 20-30 minutes (was 10-15 minutes)    How long can you walk comfortably? 30 minutes (was 20-25 minutes)    Patient Stated Goals Walk better without the cane for yard work and return to work    Pain Onset More than a month ago              [facility] Adult PT Treatment/Exercise - 11/01/20 0001       Knee/Hip Exercises: Stretches   Active Hamstring Stretch 3 reps;Left;30  seconds    Active Hamstring Stretch Limitations at leg press    Quad Stretch Left;3 reps;60 seconds    Quad Stretch Limitations prone with strap    Gastroc Stretch 30 seconds;3 reps;Both    Gastroc Stretch Limitations slantboard      Knee/Hip Exercises: Aerobic   Recumbent Bike Seat 5 for 10 minutes, L5      Knee/Hip Exercises: Machines for Strengthening   Total Gym Leg Press Double leg 162 lbs 3 x 10, Single leg Lt 81 lbs 3 x 10      Knee/Hip Exercises: Standing   Other Standing Knee Exercises TRX lunges X10 bilat, TRX deep squats 2X10 holding 5 sec      Manual Therapy   Manual therapy comments palpation and active compression with DN              Trigger Point Dry Needling - 11/01/20 0001     Muscles Treated Lower Quadrant Quadriceps;Vastus lateralis;Peroneals;Gastrocnemius;Hamstring    Vastus lateralis Response Twitch response elicited    Hamstring Response Twitch response elicited    Gastrocnemius Response Twitch response elicited                     PT Short Term Goals - 08/16/20 1535       PT SHORT TERM GOAL #1   Title [name] will report independence and compliance with her starter HEP addressing AROM and quadriceps strength impairments.    Status Achieved               PT Long Term Goals - 10/18/20 1623       PT LONG TERM GOAL #1   Title Improve FOTO score to 59.    Status On-going      PT LONG TERM GOAL #2   Title Improve L knee pain to consistently 0-3/10 on the Numeric Pain Rating Scale.    Baseline now met    Status Achieved      PT LONG TERM GOAL #3   Title [name] will be able to walk 1/4 mile or more from her car to her office without an assistive device or complaints of functional difficulty.    Baseline now met    Status Achieved      PT LONG TERM GOAL #4   Title Improve L knee AROM for extension to -3 degrees or better and flexion to 110 degrees or better by DC.    Baseline -5 to 110 PROM now  Status On-going      PT  LONG TERM GOAL #5   Title [name] will be independent and compliant with her long-term HEP at DC.    Status Achieved                   Plan - 11/01/20 0816     Clinical Impression Statement Continued to work to maximize her ROM limitation in her knee. Other than that she has done well post op and she will need MD progress note next visit.    Personal Factors and Comorbidities Comorbidity 3+    Comorbidities Previous R knee scope in 2018, bariatric surgery, CHF    Examination-Activity Limitations Bathing;Dressing;Sit;Transfers;Bed Mobility;Sleep;Bend;Lift;Squat;Locomotion Level;Stairs;Carry;Stand    Examination-Participation Restrictions Interpersonal Relationship;Occupation;Yard Work;Community Activity    Stability/Clinical Decision Making Stable/Uncomplicated    Rehab Potential Good    PT Frequency 3x / week    PT Duration 4 weeks    PT Treatment/Interventions ADLs/Self Care Home Management;Electrical Stimulation;Cryotherapy;Therapeutic activities;Stair training;Gait training;Therapeutic exercise;Balance training;Neuromuscular re-education;Patient/family education;Manual techniques;Vasopneumatic Device    PT Next Visit Plan DN if desired. agressive ROM focus    PT Home Exercise Plan Access Code: [email or web address]    Consulted and Agree with Plan of Care Patient             Patient will benefit from skilled therapeutic intervention in order to improve the following deficits and impairments:  Abnormal gait, Decreased activity tolerance, Decreased balance, Decreased endurance, Decreased range of motion, Decreased strength, Difficulty walking, Increased edema, Impaired flexibility, Pain, Obesity  Visit Diagnosis: Difficulty walking  Muscle weakness (generalized)  Stiffness of left knee, not elsewhere classified  Chronic pain of left knee  Localized edema     Problem List Patient Active Problem List   Diagnosis Date Noted   Stiffness of left knee 08/18/2020   Status post  total left knee replacement 05/23/2020   Primary osteoarthritis of left knee 05/05/2020   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   Fatty liver disease, nonalcoholic 62/26/3335   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   Trigger finger, right middle finger 10/10/2018   Other insomnia 06/23/2018   Vitamin D deficiency 03/26/2018   Prediabetes 03/11/2018   Obesity 02/26/2018   Migraines 10/01/2016   Decreased cardiac ejection fraction 10/01/2016   Severe obesity ([place]) 10/01/2016   Shingles 08/03/2016   Chronic pain of right knee 05/24/2016   Unilateral primary osteoarthritis, right knee 05/24/2016   Posterior tibial tendinitis, right leg 45/62/5638   Lichen sclerosus 93/73/4287    Debbe Odea, PT,DPT 11/01/2020, 8:17 AM  [facility] Physical Therapy [address] [place], [place], [address] Phone: [phone]   Fax:  [phone]  Name: Savannah [name] MRN: [id number] Date of Birth: [birth year]

## 2020-11-02 ENCOUNTER — Other Ambulatory Visit: Payer: Self-pay

## 2020-11-02 ENCOUNTER — Ambulatory Visit (INDEPENDENT_AMBULATORY_CARE_PROVIDER_SITE_OTHER): Payer: BC Managed Care – PPO | Admitting: Physical Therapy

## 2020-11-02 DIAGNOSIS — R600 Localized edema: Secondary | ICD-10-CM

## 2020-11-02 DIAGNOSIS — R262 Difficulty in walking, not elsewhere classified: Secondary | ICD-10-CM | POA: Diagnosis not present

## 2020-11-02 DIAGNOSIS — M6281 Muscle weakness (generalized): Secondary | ICD-10-CM | POA: Diagnosis not present

## 2020-11-02 DIAGNOSIS — G8929 Other chronic pain: Secondary | ICD-10-CM

## 2020-11-02 DIAGNOSIS — M25562 Pain in left knee: Secondary | ICD-10-CM | POA: Diagnosis not present

## 2020-11-02 DIAGNOSIS — M25662 Stiffness of left knee, not elsewhere classified: Secondary | ICD-10-CM

## 2020-11-03 ENCOUNTER — Ambulatory Visit (INDEPENDENT_AMBULATORY_CARE_PROVIDER_SITE_OTHER): Payer: BC Managed Care – PPO | Admitting: Orthopaedic Surgery

## 2020-11-03 ENCOUNTER — Encounter: Payer: Self-pay | Admitting: Orthopaedic Surgery

## 2020-11-03 DIAGNOSIS — Z96652 Presence of left artificial knee joint: Secondary | ICD-10-CM

## 2020-11-03 MED ORDER — TIZANIDINE HCL 4 MG PO TABS: 6.0000 mg | ORAL_TABLET | Freq: Three times a day (TID) | ORAL | 6 refills | Status: DC | PRN

## 2020-11-03 MED ORDER — TRAMADOL HCL 50 MG PO TABS: 50.0000 mg | ORAL_TABLET | Freq: Three times a day (TID) | ORAL | 2 refills | Status: DC | PRN

## 2020-11-03 NOTE — Therapy (Signed)
[facility] Physical Therapy [address] [place], [place], [address] Phone: [phone]   Fax:  [phone]  Physical Therapy Treatment/Discharge PHYSICAL THERAPY DISCHARGE SUMMARY  Visits from Start of Care: 44  Current functional level related to goals / functional outcomes: See below   Remaining deficits: See below   Education / Equipment: See below Plan: Patient agrees to discharge.  Patient goals were met. Patient is being discharged due to being at at good functional level.       Patient Details  Name: Savannah [name] MRN: [id number] Date of Birth: 07-15-1960 Referring Provider (PT): Frankey Shown, MD   Encounter Date: 11/02/2020   PT End of Session - 11/03/20 0736     Visit Number 41    Number of Visits 13    Date for PT Re-Evaluation 11/04/20    Progress Note Due on Visit 104    PT Start Time 1555    PT Stop Time 1645    PT Time Calculation (min) 50 min    Activity Tolerance Patient tolerated treatment well    Behavior During Therapy WFL for tasks assessed/performed             Past Medical History:  Diagnosis Date   Arthritis    bilateral knees, recent LCL sprain-on left knee   CHF (congestive heart failure) ([place])    Dysrhythmia    trigem   Environmental allergies    Family history of adverse reaction to anesthesia    slow to wake up   Fatty liver    Headache(784.0)    migraines-on meds for control, relpax, ketoprophen, vicoden, muscle relaxer   Hearing loss 2016   High frequency hearing - Bilateral    History of hiatal hernia    Hot flashes    Joint pain    Lichen sclerosus    Lipoma    Right Knee   Menorrhagia    Migraines    Necrobiosis lipoidica    Osteoarthritis    Overweight    Pelvic pain in female    PONV (postoperative nausea and vomiting)    ponv, slow to awake   Seasonal allergies    Sinus problem    Sprain 10/25/2010   left knee   Trigeminal pulse    Trigger finger, right    Middle finger and right  wrist    Vitamin D deficiency     Past Surgical History:  Procedure Laterality Date   APPENDECTOMY     CHOLECYSTECTOMY     DIAGNOSTIC LAPAROSCOPY  08/2006   DILATION AND CURETTAGE OF UTERUS  2011   attempted ablation   FUNCTIONAL ENDOSCOPIC SINUS SURGERY     ganglion cyst removal     HYSTEROSCOPY     failed   KNEE ARTHROPLASTY Right 2018   meniscus   KNEE CLOSED REDUCTION Left 08/18/2020   Procedure: LEFT KNEE MANIPULATION UNDER ANESTHESIA;  Surgeon: Leandrew Koyanagi, MD;  Location: [place];  Service: Orthopedics;  Laterality: Left;   LAPAROSCOPIC GASTRIC SLEEVE RESECTION  01/2020   LASIK     LIPOMA EXCISION Right 07/22/2014   Procedure: EXCISION RIGHT THIGH LIPOMA;  Surgeon: Erroll Luna, MD;  Location: [place];  Service: General;  Laterality: Right;   ROBOTIC ASSISTED TOTAL HYSTERECTOMY  11/06/10   TLH/RSO   SALPINGOOPHORECTOMY  11/06/2010   Procedure: SALPINGO OOPHERECTOMY;  Surgeon: Felipa Emory;  Location: [place] [place] [facility];  Service: Gynecology;  Laterality: Right;   TOTAL KNEE ARTHROPLASTY Left 05/23/2020  Procedure: LEFT TOTAL KNEE ARTHROPLASTY;  Surgeon: Leandrew Koyanagi, MD;  Location: [place];  Service: Orthopedics;  Laterality: Left;   TRIGGER FINGER RELEASE     UPPER GI ENDOSCOPY N/A 02/02/2020   Procedure: UPPER GI ENDOSCOPY;  Surgeon: [name], MD;  [facility];  Service: General;  Laterality: N/A;    There were no vitals filed for this visit.   Subjective Assessment - 11/03/20 0735     Subjective Stiffness is her only real complaint    Patient is accompained by: Family member    Pertinent History s/p left knee manipulation on 08/18/2020, R knee scope 2018, Bariatric surgery, CHF    Limitations Sitting;House hold activities;Standing;Walking    How long can you sit comfortably? Stiffness with standing after prolonged sitting    How long can you stand comfortably? 20-30 minutes (was 10-15 minutes)    How long can you walk comfortably? 30  minutes (was 20-25 minutes)    Patient Stated Goals Walk better without the cane for yard work and return to work    Pain Onset More than a month ago                [facility] PT Assessment - 11/03/20 0001       Assessment   Medical Diagnosis s/p L TKA, s/p manipulation on 08/18/2020    Referring Provider (PT) Frankey Shown, MD    Onset Date/Surgical Date 05/23/20      AROM   Left Knee Extension -4    Left Knee Flexion 110      PROM   Left Knee Extension -2    Left Knee Flexion 115      Strength   Overall Strength Comments Lt knee strength 5/5 MMT                           [facility] Adult PT Treatment/Exercise - 11/03/20 0001       Knee/Hip Exercises: Aerobic   Recumbent Bike Seat 5 for 10 minutes, L5      Knee/Hip Exercises: Machines for Strengthening   Total Gym Leg Press Double leg 162 lbs 3 x 10, Single leg Lt 81 lbs 3 x 10      Knee/Hip Exercises: Standing   Other Standing Knee Exercises TRX lunges X10 bilat, TRX deep squats 2X10 holding 5 sec      Manual Therapy   Manual therapy comments palpation and active compression with DN, Knee PROM and flexion mobs                       PT Short Term Goals - 08/16/20 1535       PT SHORT TERM GOAL #1   Title [name] will report independence and compliance with her starter HEP addressing AROM and quadriceps strength impairments.    Status Achieved               PT Long Term Goals - 11/03/20 0739       PT LONG TERM GOAL #1   Title Improve FOTO score to 59.    Status Achieved      PT LONG TERM GOAL #2   Title Improve L knee pain to consistently 0-3/10 on the Numeric Pain Rating Scale.    Baseline now met    Status Achieved      PT LONG TERM GOAL #3   Title [name] will be able to walk 1/4 mile or more  from her car to her office without an assistive device or complaints of functional difficulty.    Baseline now met    Status Achieved      PT LONG TERM GOAL #4   Title Improve L knee  AROM for extension to -3 degrees or better and flexion to 110 degrees or better by DC.    Baseline -5 to 110 PROM now    Status Partially Met      PT LONG TERM GOAL #5   Title [name] will be independent and compliant with her long-term HEP at DC.    Status Achieved                   Plan - 11/03/20 0737     Clinical Impression Statement She has attended extensive PT for Lt TKA. At this point her only limitation is ROM and stiffness but this is now mild. She has great strength, she is not having pain and she is a at a good functional level so I am recommending Discharge to independent program.    Personal Factors and Comorbidities Comorbidity 3+    Comorbidities Previous R knee scope in 2018, bariatric surgery, CHF    Examination-Activity Limitations Bathing;Dressing;Sit;Transfers;Bed Mobility;Sleep;Bend;Lift;Squat;Locomotion Level;Stairs;Carry;Stand    Examination-Participation Restrictions Interpersonal Relationship;Occupation;Yard Work;Community Activity    Stability/Clinical Decision Making Stable/Uncomplicated    Rehab Potential Good    PT Frequency 3x / week    PT Duration 4 weeks    PT Treatment/Interventions ADLs/Self Care Home Management;Electrical Stimulation;Cryotherapy;Therapeutic activities;Stair training;Gait training;Therapeutic exercise;Balance training;Neuromuscular re-education;Patient/family education;Manual techniques;Vasopneumatic Device    PT Next Visit Plan DC today    PT Home Exercise Plan Access Code: [email or web address]    Consulted and Agree with Plan of Care Patient             Patient will benefit from skilled therapeutic intervention in order to improve the following deficits and impairments:  Abnormal gait, Decreased activity tolerance, Decreased balance, Decreased endurance, Decreased range of motion, Decreased strength, Difficulty walking, Increased edema, Impaired flexibility, Pain, Obesity  Visit Diagnosis: Difficulty walking  Muscle weakness  (generalized)  Stiffness of left knee, not elsewhere classified  Chronic pain of left knee  Localized edema     Problem List Patient Active Problem List   Diagnosis Date Noted   Stiffness of left knee 08/18/2020   Status post total left knee replacement 05/23/2020   Primary osteoarthritis of left knee 05/05/2020   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   Fatty liver disease, nonalcoholic 21/12/5518   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   Trigger finger, right middle finger 10/10/2018   Other insomnia 06/23/2018   Vitamin D deficiency 03/26/2018   Prediabetes 03/11/2018   Obesity 02/26/2018   Migraines 10/01/2016   Decreased cardiac ejection fraction 10/01/2016   Severe obesity ([place]) 10/01/2016   Shingles 08/03/2016   Chronic pain of right knee 05/24/2016   Unilateral primary osteoarthritis, right knee 05/24/2016   Posterior tibial tendinitis, right leg 80/22/3361   Lichen sclerosus 22/44/9753    Debbe Odea, PT.DPT 11/03/2020, 7:40 AM  [facility] Physical Therapy [address] [place], [place], [address] Phone: [phone]   Fax:  [phone]  Name: Savannah [name] MRN: [id number] Date of Birth: 1960-07-20

## 2020-11-03 NOTE — Progress Notes (Signed)
Post-Op Visit Note   Patient: Savannah [name]           Date of Birth: 09-16-1960           MRN: [id number] Visit Date: 11/03/2020 PCP: Lawerance Cruel, MD   Assessment & Plan:  Chief Complaint:  Chief Complaint  Patient presents with   Left Knee - Pain, Follow-up   Visit Diagnoses:  1. Status post total left knee replacement     Plan: Savannah [name] is status post left total knee replacement on 05/23/2020 and status post left knee manipulation on 08/18/2020.  Her main complaint is stiffness.  Reports no pain per se.  Would like a stronger dosage of Zanaflex to help with the muscle spasms.  She returned back to work on Monday at [employer].  Left knee shows a fully healed surgical scar.  She lacks about 5 degrees of full extension and I can get her to about 90 to 95 degrees of flexion with guarding and soft endpoint  Savannah [name] has now completed physical therapy.  I think what is limiting her is some continued inflammation and pain as well.  I refilled her Zanaflex and the tramadol especially to help with discomfort at night.  She will continue to work aggressively at improving her range of motion.  Follow-up in 3 months with two-view x-rays of the left knee.  Follow-Up Instructions: Return in about 3 months (around 02/02/2021).   Orders:  No orders of the defined types were placed in this encounter.  Meds ordered this encounter  Medications   tiZANidine (ZANAFLEX) 4 MG tablet    Sig: Take 1.5 tablets (6 mg total) by mouth every 8 (eight) hours as needed for muscle spasms.    Dispense:  60 tablet    Refill:  6   traMADol (ULTRAM) 50 MG tablet    Sig: Take 1 tablet (50 mg total) by mouth 3 (three) times daily as needed.    Dispense:  60 tablet    Refill:  2    Imaging: No results found.  PMFS History: Patient Active Problem List   Diagnosis Date Noted   Stiffness of left knee 08/18/2020   Status post total left knee replacement 05/23/2020   Primary osteoarthritis of left knee  05/05/2020   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   Fatty liver disease, nonalcoholic 26/33/3545   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   Trigger finger, right middle finger 10/10/2018   Other insomnia 06/23/2018   Vitamin D deficiency 03/26/2018   Prediabetes 03/11/2018   Obesity 02/26/2018   Migraines 10/01/2016   Decreased cardiac ejection fraction 10/01/2016   Severe obesity ([place]) 10/01/2016   Shingles 08/03/2016   Chronic pain of right knee 05/24/2016   Unilateral primary osteoarthritis, right knee 05/24/2016   Posterior tibial tendinitis, right leg 62/56/3893   Lichen sclerosus 73/42/8768   Past Medical History:  Diagnosis Date   Arthritis    bilateral knees, recent LCL sprain-on left knee   CHF (congestive heart failure) ([place])    Dysrhythmia    trigem   Environmental allergies    Family history of adverse reaction to anesthesia    slow to wake up   Fatty liver    Headache(784.0)    migraines-on meds for control, relpax, ketoprophen, vicoden, muscle relaxer   Hearing loss 2016   High frequency hearing - Bilateral    History of hiatal hernia    Hot flashes    Joint pain    Lichen  sclerosus    Lipoma    Right Knee   Menorrhagia    Migraines    Necrobiosis lipoidica    Osteoarthritis    Overweight    Pelvic pain in female    PONV (postoperative nausea and vomiting)    ponv, slow to awake   Seasonal allergies    Sinus problem    Sprain 10/25/2010   left knee   Trigeminal [name]    Trigger finger, right    Middle finger and right wrist    Vitamin D deficiency     Family History  Problem Relation Age of Onset   Skin cancer Mother    Migraines Mother    High blood pressure Mother    Heart disease Father    High blood pressure Father    High Cholesterol Father    Breast cancer Maternal Aunt 70   Breast cancer Paternal Aunt 66   Migraines Brother     Past Surgical History:  Procedure Laterality Date   APPENDECTOMY      CHOLECYSTECTOMY     DIAGNOSTIC LAPAROSCOPY  08/2006   DILATION AND CURETTAGE OF UTERUS  2011   attempted ablation   FUNCTIONAL ENDOSCOPIC SINUS SURGERY     ganglion cyst removal     HYSTEROSCOPY     failed   KNEE ARTHROPLASTY Right 2018   meniscus   KNEE CLOSED REDUCTION Left 08/18/2020   Procedure: LEFT KNEE MANIPULATION UNDER ANESTHESIA;  Surgeon: Leandrew Koyanagi, MD;  Location: [place];  Service: Orthopedics;  Laterality: Left;   LAPAROSCOPIC GASTRIC SLEEVE RESECTION  01/2020   LASIK     LIPOMA EXCISION Right 07/22/2014   Procedure: EXCISION RIGHT THIGH LIPOMA;  Surgeon: Erroll Luna, MD;  Location: [place];  Service: General;  Laterality: Right;   ROBOTIC ASSISTED TOTAL HYSTERECTOMY  11/06/10   TLH/RSO   SALPINGOOPHORECTOMY  11/06/2010   Procedure: SALPINGO OOPHERECTOMY;  Surgeon: Felipa Emory;  Location: [place] [facility];  Service: Gynecology;  Laterality: Right;   TOTAL KNEE ARTHROPLASTY Left 05/23/2020   Procedure: LEFT TOTAL KNEE ARTHROPLASTY;  Surgeon: Leandrew Koyanagi, MD;  Location: [place];  Service: Orthopedics;  Laterality: Left;   TRIGGER FINGER RELEASE     UPPER GI ENDOSCOPY N/A 02/02/2020   Procedure: UPPER GI ENDOSCOPY;  Surgeon: [name], MD;  [facility];  Service: General;  Laterality: N/A;   Social History   Occupational History   Occupation: Administrator, [employer]  Tobacco Use   Smoking status: Never   Smokeless tobacco: Never  Vaping Use   Vaping Use: Never used  Substance and Sexual Activity   Alcohol use: No    Alcohol/week: 0.0 standard drinks   Drug use: No   Sexual activity: Yes    Birth control/protection: Surgical    Comment: TLH/RSO

## 2020-11-04 NOTE — Telephone Encounter (Signed)
FAXED TO [place]

## 2020-11-09 NOTE — Progress Notes (Signed)
Telehealth Visit        Date:  11/14/2020   ID:  Savannah [name], DOB 1960/11/10, MRN [id number]  PCP:  Lawerance Cruel, MD  Cardiologist:  Jenkins Rouge, MD  Electrophysiologist:  None   Chief Complaint:  Follow up.   History of Present Illness:    Savannah [name] is a 60 y.o. female has a history of NICM, PVCs - asymptomatic, no CAD, migraines, obesity and prior shingles.   She has been on ACE and beta blocker. Losartan changed to lisinopril due to concerns for possible insomnia.   Had gastric sleeve and cholecystectomy done December 2021  Had left TKR 8/85/02 complicated by arthrofibrosis requiring manipulation under anesthesia Again 08/18/20  She works at [employer] in [employer]    Past Medical History:  Diagnosis Date   Arthritis    bilateral knees, recent LCL sprain-on left knee   CHF (congestive heart failure) ([place])    Dysrhythmia    trigem   Environmental allergies    Family history of adverse reaction to anesthesia    slow to wake up   Fatty liver    Headache(784.0)    migraines-on meds for control, relpax, ketoprophen, vicoden, muscle relaxer   Hearing loss 2016   High frequency hearing - Bilateral    History of hiatal hernia    Hot flashes    Joint pain    Lichen sclerosus    Lipoma    Right Knee   Menorrhagia    Migraines    Necrobiosis lipoidica    Osteoarthritis    Overweight    Pelvic pain in female    PONV (postoperative nausea and vomiting)    ponv, slow to awake   Seasonal allergies    Sinus problem    Sprain 10/25/2010   left knee   Trigeminal pulse    Trigger finger, right    Middle finger and right wrist    Vitamin D deficiency    Past Surgical History:  Procedure Laterality Date   APPENDECTOMY     CHOLECYSTECTOMY     DIAGNOSTIC LAPAROSCOPY  08/2006   DILATION AND CURETTAGE OF UTERUS  2011   attempted ablation   FUNCTIONAL ENDOSCOPIC SINUS SURGERY     ganglion cyst removal     HYSTEROSCOPY     failed   KNEE ARTHROPLASTY  Right 2018   meniscus   KNEE CLOSED REDUCTION Left 08/18/2020   Procedure: LEFT KNEE MANIPULATION UNDER ANESTHESIA;  Surgeon: Leandrew Koyanagi, MD;  Location: [facility];  Service: Orthopedics;  Laterality: Left;   LAPAROSCOPIC GASTRIC SLEEVE RESECTION  01/2020   LASIK     LIPOMA EXCISION Right 07/22/2014   Procedure: EXCISION RIGHT THIGH LIPOMA;  Surgeon: Erroll Luna, MD;  Location: [facility];  Service: General;  Laterality: Right;   ROBOTIC ASSISTED TOTAL HYSTERECTOMY  11/06/10   TLH/RSO   SALPINGOOPHORECTOMY  11/06/2010   Procedure: SALPINGO OOPHERECTOMY;  Surgeon: Felipa Emory;  Location: [facility];  Service: Gynecology;  Laterality: Right;   TOTAL KNEE ARTHROPLASTY Left 05/23/2020   Procedure: LEFT TOTAL KNEE ARTHROPLASTY;  Surgeon: Leandrew Koyanagi, MD;  Location: [facility];  Service: Orthopedics;  Laterality: Left;   TRIGGER FINGER RELEASE     UPPER GI ENDOSCOPY N/A 02/02/2020   Procedure: UPPER GI ENDOSCOPY;  Surgeon: [name], MD;  Location: [facility];  Service: General;  Laterality: N/A;     Current Meds  Medication Sig   amoxicillin (AMOXIL) 500 MG  tablet Take four pills one hour prior to dental work   Ca Phosphate-Cholecalciferol (CALCIUM 500 + D3) 250-500 MG-UNIT CHEW Chew 1 tablet by mouth in the morning, at noon, and at bedtime. Bariatric Calcium Citrate chewy   carvedilol (COREG) 6.25 MG tablet TAKE 1 TABLET TWICE DAILY  WITH MEALS   clobetasol cream (TEMOVATE) 4.17 % Apply 1 application topically as needed (lichen sclerosus). (Patient taking differently: Apply 1 application topically in the morning and at bedtime.)   eletriptan (RELPAX) 40 MG tablet TAKE 1 TABLET TWICE A DAY  AS NEEDED FOR MIGRAINE OR  HEADACHE   furosemide (LASIX) 20 MG tablet Take 0.5 tablets (10 mg total) by mouth daily as needed for edema.   hyoscyamine (LEVSIN) 0.125 MG tablet Take 0.125 mg by mouth every 4 (four) hours as needed (abdominal cramps/spasms).   lisinopril (ZESTRIL) 10  MG tablet TAKE 1 TABLET DAILY   meclizine (ANTIVERT) 25 MG tablet Take 25 mg by mouth 4 (four) times daily as needed for dizziness.   mometasone (ELOCON) 0.1 % ointment Use small topically twice weekly for maintenance. (Patient taking differently: Apply 1 application topically See admin instructions. Use small topically twice weekly as needed for irritation)   Multiple Vitamins-Minerals (BARIATRIC MULTIVITAMINS/IRON PO) Take 1 tablet by mouth in the morning. BariMelts Multivitamin W/Iron (chewable)   multivitamin-lutein (OCUVITE-LUTEIN) CAPS capsule Take 1 capsule by mouth in the morning.   tiZANidine (ZANAFLEX) 4 MG tablet Take 1.5 tablets (6 mg total) by mouth every 8 (eight) hours as needed for muscle spasms.   traMADol (ULTRAM) 50 MG tablet Take 1 tablet (50 mg total) by mouth 3 (three) times daily as needed.     Allergies:   Dust mite extract and Nsaids   Social History   Tobacco Use   Smoking status: Never   Smokeless tobacco: Never  Vaping Use   Vaping Use: Never used  Substance Use Topics   Alcohol use: No    Alcohol/week: 0.0 standard drinks   Drug use: No     Family Hx: The patient's family history includes Breast cancer (age of onset: 50) in her maternal aunt and paternal aunt; Heart disease in her father; High Cholesterol in her father; High blood pressure in her father and mother; Migraines in her brother and mother; Skin cancer in her mother.  ROS:   Please see the history of present illness.   All other systems reviewed are negative.    Objective:    Vital Signs:  BP 122/80 (BP Location: Left Arm, Patient Position: Sitting, Cuff Size: Large)   Pulse 84   Ht 5\' 5"  (1.651 m)   Wt 99.5 kg   LMP 10/14/2010   SpO2 100%   BMI 36.51 kg/m    Wt Readings from Last 3 Encounters:  11/14/20 99.5 kg  09/19/20 98.1 kg  09/01/20 98.4 kg   Affect appropriate Healthy:  appears stated age 78: normal Neck supple with no adenopathy JVP normal no bruits no  thyromegaly Lungs clear with no wheezing and good diaphragmatic motion Heart:  S1/S2 no murmur, no rub, gallop or click PMI normal post lap GB surgery and gastric sleeve Abdomen: benighn, BS positve, no tenderness, no AAA no bruit.  No HSM or HJR Distal pulses intact with no bruits No edema Neuro non-focal Post left TKR    Labs/Other Tests and Data Reviewed:    Lab Results  Component Value Date   WBC 13.4 (H) 05/24/2020   HGB 12.7 05/24/2020   HCT 39.4  05/24/2020   PLT 238 05/24/2020   GLUCOSE 87 05/24/2020   CHOL 166 07/21/2018   TRIG 133 07/21/2018   HDL 44 07/21/2018   LDLCALC 95 07/21/2018   ALT 71 (H) 05/17/2020   AST 56 (H) 05/17/2020   NA 140 05/24/2020   K 3.8 05/24/2020   CL 106 05/24/2020   CREATININE 0.89 05/24/2020   BUN 8 05/24/2020   CO2 24 05/24/2020   TSH 3.670 10/18/2017   INR 1.0 05/17/2020   HGBA1C 5.6 07/21/2018       BNP (last 3 results) No results for input(s): BNP in the last 8760 hours.  ProBNP (last 3 results) No results for input(s): PROBNP in the last 8760 hours.    Prior CV studies:    The following studies were reviewed today:  ECHO IMPRESSIONS 09/2019   1. Left ventricular ejection fraction, by estimation, is 50%. The left  ventricle has mildly reduced systolic function. Mild global hypokinesis.  Left ventricular diastolic parameters are consistent with Grade I  diastolic dysfunction (impaired  relaxation). GLS -23.2%, normal.   2. Right ventricular systolic function is normal. The right ventricular  size is normal. Tricuspid regurgitation signal is inadequate for assessing  PA pressure.   3. The aortic valve is tricuspid. Aortic valve regurgitation is not  visualized. No aortic stenosis is present.   4. The mitral valve is normal in structure. No evidence of mitral valve  regurgitation. No evidence of mitral stenosis.   5. The inferior vena cava is normal in size with greater than 50%  respiratory variability, suggesting  right atrial pressure of 3 mmHg.   Comparison(s): 09/10/17 EF 45-50%.    Myoview Study Highlights 09/2018  EF is 51 %. Visually, the EF appears to be greater than 51% This is a low risk study. There is no evidence of ischemia or infarction . The study is normal    ASSESSMENT & PLAN:    1. NICM - last echo from August noted - EF now 50% - she is doing well. NYHA I/II - no changes made today. Remains on beta blocker, ACE and prn Lasix.   2. Prior chest pain - normal Myoview in 2020 and calcium score of 0  resolved   3. Prior elevated LFTs - improving. F/U primary 05/17/20 AST 56 ALT 71   4. Obesity - now s/p gastric sleeve - actively losing weight.   5. Ortho:  post left TKR with revision PT/OT    Medication Adjustments/Labs and Tests Ordered: Current medicines are reviewed at length with the patient today.  Concerns regarding medicines are outlined above.   Tests Ordered: No orders of the defined types were placed in this encounter.   Medication Changes: No orders of the defined types were placed in this encounter.   Disposition:  FU with me in a year     Patient is agreeable to this plan and will call if any problems develop in the interim.   Signed, Jenkins Rouge, MD  11/14/2020 8:25 AM    [place]

## 2020-11-14 ENCOUNTER — Encounter: Payer: Self-pay | Admitting: Cardiovascular Disease

## 2020-11-14 ENCOUNTER — Other Ambulatory Visit: Payer: Self-pay

## 2020-11-14 ENCOUNTER — Ambulatory Visit (INDEPENDENT_AMBULATORY_CARE_PROVIDER_SITE_OTHER): Payer: BC Managed Care – PPO | Admitting: Cardiovascular Disease

## 2020-11-14 VITALS — BP 122/80 | HR 84 | Ht 65.0 in | Wt 219.4 lb

## 2020-11-14 DIAGNOSIS — Z96652 Presence of left artificial knee joint: Secondary | ICD-10-CM

## 2020-11-14 DIAGNOSIS — R0789 Other chest pain: Secondary | ICD-10-CM | POA: Diagnosis not present

## 2020-11-14 DIAGNOSIS — Z903 Acquired absence of stomach [part of]: Secondary | ICD-10-CM

## 2020-11-14 DIAGNOSIS — I428 Other cardiomyopathies: Secondary | ICD-10-CM | POA: Diagnosis not present

## 2020-11-14 NOTE — Patient Instructions (Signed)
Medication Instructions:  Your physician recommends that you continue on your current medications as directed. Please refer to the Current Medication list given to you today.  *If you need a refill on your cardiac medications before your next appointment, please call your pharmacy*  Lab Work: If you have labs (blood work) drawn today and your tests are completely normal, you will receive your results only by: [place] (if you have MyChart) OR A paper copy in the mail If you have any lab test that is abnormal or we need to change your treatment, we will call you to review the results.  Follow-Up: At [facility], you and your health needs are our priority.  As part of our continuing mission to provide you with exceptional heart care, we have created designated Provider Care Teams.  These Care Teams include your primary Cardiologist (physician) and Advanced Practice Providers (APPs -  Physician Assistants and Nurse Practitioners) who all work together to provide you with the care you need, when you need it.  We recommend signing up for the patient portal called "MyChart".  Sign up information is provided on this After Visit Summary.  MyChart is used to connect with patients for Virtual Visits (Telemedicine).  Patients are able to view lab/test results, encounter notes, upcoming appointments, etc.  Non-urgent messages can be sent to your provider as well.   To learn more about what you can do with MyChart, go to [email or web address].    Your next appointment:   1 year(s)  The format for your next appointment:   In Person  Provider:   You may see Jenkins Rouge, MD or one of the following Advanced Practice Providers on your designated Care Team:   Cecilie Kicks, NP

## 2020-11-15 ENCOUNTER — Encounter: Payer: BC Managed Care – PPO | Attending: General Surgery | Admitting: Skilled Nursing Facility1

## 2020-11-15 NOTE — Progress Notes (Signed)
  Medical Nutrition Therapy  Primary concerns today: Post-operative Bariatric Surgery Nutrition Management  Anthropometrics  Surgery date: 02/02/2020 Surgery type: Sleeve Start weight at [facility]: 280 lb Weight today: 216 pounds   Body Composition Scale 02/16/2020 03/30/2020 09/19/2020 11/15/2020  Current Body Weight 258.1 243.8 216.3 216  Total Body Fat % 47 45.7 42.9 43.4  Visceral Fat 19 17 14 15   Fat-Free Mass % 52.9 54.2 57 56.4   Total Body Water % 40.9 41.6 43 42.7  Muscle-Mass lbs 30.6 30.5 30.1 29.5  BMI 42.6 40.2 35.7 36.5  Body Fat Displacement             Torso  lbs 75.2 69.1 57.4 58         Left Leg  lbs 15 13.8 11.4 11.6         Right Leg  lbs 15 13.8 11.4 11.6         Left Arm  lbs 7.5 6.9 5.7 5.8         Right Arm   lbs 7.5 6.9 5.7 5.8    Pt states her knee is still giving her some trouble keeping her from being as mobile as she wants having recently finished PT.  Pt states she is willing to do what it takes to lose weight so dietitian set up a weight loss plan to try and check in at 2 months.      24 hr recall:  First meal: yogurt light and fit Snack: Second meal: soup or leftovers  Snack: a couple jelly beans Third meal: hamburger + veggies Snack: jelly bean or 2  Beverages: chai tea + low fat milk water, water + flavoring   Fluid intake: adequate   Medications: See List Supplementation: appropriate   Using straws: no Drinking while eating: no Having you been chewing well: yes Chewing/swallowing difficulties: no Changes in vision: no Changes to mood/headaches: no Hair loss/Cahnges to skin/Changes to nails: no Any difficulty focusing or concentrating: no Sweating: no Dizziness/Lightheaded: no Palpitations: no  Carbonated beverages: no N/V/D/C/GAS: no Abdominal Pain: no Dumping syndrome: no  Recent physical activity:  ADL's due to knee pain   Progress Towards Goal(s):  In Progress Teaching method utilized: Visual & Auditory  Demonstrated  degree of understanding via: Teach Back  Readiness Level: Action Barriers to learning/adherence to lifestyle change: none identified  Handouts: 1200 calorie meal ideas for one week  Goals: -read your label of chai latte: if any grams of sugar let it go  -let your jelly beans go  -let dinner be the last food you put into your mouth at night  -aim for an average of 1200 calories per week so an average of about 300 calories per meal and then 100 calories per snack with 3 meals and 2 snacks  -only complex carbohydrates: beans, brown rice, whole wheat bread, fruit, bean pasta  -take a fish oil supplement daily    Teaching Method Utilized:  Visual Auditory Hands on  Demonstrated degree of understanding via:  Teach Back   Monitoring/Evaluation:  Dietary intake, exercise, and body weight.

## 2020-11-24 ENCOUNTER — Encounter (HOSPITAL_COMMUNITY): Payer: Self-pay | Admitting: *Deleted

## 2021-01-23 ENCOUNTER — Ambulatory Visit: Payer: BC Managed Care – PPO | Admitting: Skilled Nursing Facility1

## 2021-01-23 ENCOUNTER — Encounter: Payer: BC Managed Care – PPO | Attending: General Surgery | Admitting: Skilled Nursing Facility1

## 2021-01-23 NOTE — Progress Notes (Signed)
  Medical Nutrition Therapy  Primary concerns today: Post-operative Bariatric Surgery Nutrition Management  Anthropometrics  Surgery date: 02/02/2020 Surgery type: Sleeve Start weight at [facility]: 280 lb Weight today: virtual appt; pt identified by name and DOB; pt agreeable to limitations of this visit type   Body Composition Scale 02/16/2020 03/30/2020 09/19/2020 11/15/2020  Current Body Weight 258.1 243.8 216.3 216  Total Body Fat % 47 45.7 42.9 43.4  Visceral Fat 19 17 14 15   Fat-Free Mass % 52.9 54.2 57 56.4   Total Body Water % 40.9 41.6 43 42.7  Muscle-Mass lbs 30.6 30.5 30.1 29.5  BMI 42.6 40.2 35.7 36.5  Body Fat Displacement             Torso  lbs 75.2 69.1 57.4 58         Left Leg  lbs 15 13.8 11.4 11.6         Right Leg  lbs 15 13.8 11.4 11.6         Left Arm  lbs 7.5 6.9 5.7 5.8         Right Arm   lbs 7.5 6.9 5.7 5.8      Pt has uvulitis so needed a virtual appt. Pt states she did not change her diet. Pt states she was not able to stick to a 1200 calorie diet but still wants to lose weight.  Pt states she is cognizant of what she eats and realizes she was going to long without eating and getting great support from her coworkers. Pt states she is starting notice all of her great support which has been encouraging.  Pt was able to reduce excess sugar.  -read your label of chai latte: if any grams of sugar let it go (did reduce it due to the sugar content) -let your jelly beans go (has not had a jelly beans in weeks)  24 hr recall:  First meal: yogurt light and fit or egg or oatmeal Snack: Second meal: canned soup + 10 crackers or leftovers  Snack:  Third meal: hamburger + veggies Snack:   Beverages: chai tea + low fat milk water, water + flavoring, hot tea, diet coke  Fluid intake: adequate   Medications: See List Supplementation: appropriate   Using straws: no Drinking while eating: no Having you been chewing well: yes Chewing/swallowing difficulties:  no Changes in vision: no Changes to mood/headaches: no Hair loss/Cahnges to skin/Changes to nails: no Any difficulty focusing or concentrating: no Sweating: no Dizziness/Lightheaded: no Palpitations: no  Carbonated beverages: no N/V/D/C/GAS: no Abdominal Pain: no Dumping syndrome: no  Recent physical activity:  ADL's due to knee pain   Progress Towards Goal(s):  In Progress Teaching method utilized: Environmental health practitioner & Auditory  Demonstrated degree of understanding via: Teach Back  Readiness Level: Action Barriers to learning/adherence to lifestyle change: none identified  Handouts: 1200 calorie meal ideas for one week  Goals: -add a veggie to your soup   Teaching Method Utilized:  Visual Auditory Hands on  Demonstrated degree of understanding via:  Teach Back   Monitoring/Evaluation:  Dietary intake, exercise, and body weight.

## 2021-02-02 ENCOUNTER — Encounter: Payer: Self-pay | Admitting: Orthopaedic Surgery

## 2021-02-02 ENCOUNTER — Other Ambulatory Visit: Payer: Self-pay

## 2021-02-02 ENCOUNTER — Ambulatory Visit: Payer: Self-pay

## 2021-02-02 ENCOUNTER — Ambulatory Visit (INDEPENDENT_AMBULATORY_CARE_PROVIDER_SITE_OTHER): Payer: BC Managed Care – PPO | Admitting: Orthopaedic Surgery

## 2021-02-02 DIAGNOSIS — Z96652 Presence of left artificial knee joint: Secondary | ICD-10-CM

## 2021-02-02 NOTE — Progress Notes (Signed)
Office Visit Note   Patient: Savannah [name]           Date of Birth: Apr 12, 1960           MRN: [id number] Visit Date: 02/02/2021              Requested by: Lawerance Cruel, [place],  [place] [id number] PCP: Lawerance Cruel, MD   Assessment & Plan: Visit Diagnoses:  1. Status post total left knee replacement     Plan: Savannah [name] is a month status post left total knee replacement.  She comes in for routine follow-up.  Overall doing well and has returned back to work.  Her main complaint is that the quadriceps feels tight.  She has trouble kneeling down to clean her house.  Denies any pain or swelling or instability.  Left knee shows fully healed surgical scar.  No signs of infection.  No bony tenderness.  Range of motion is approximately 3 to 115 degrees.  Stable to varus valgus stress.  Stable to anterior posterior stress.  From my standpoint Savannah [name] is doing well.  I think the tightness has potential to improve slightly but I am happy that she is not in any pain.  There is no evidence or suspicion for infection based on findings.  She will continue to keep trying to improve her range of motion on her own.  Recheck in 6 months with three-view x-rays of the left knee.  Follow-Up Instructions: Return in about 6 months (around 08/03/2021).   Orders:  Orders Placed This Encounter  Procedures   XR Knee 1-2 Views Left   No orders of the defined types were placed in this encounter.     Procedures: No procedures performed   Clinical Data: No additional findings.   Subjective: Chief Complaint  Patient presents with   Left Knee - Follow-up    Left total knee arthroplasty 05/23/2020    HPI  Review of Systems   Objective: Vital Signs: LMP 10/14/2010   Physical Exam  Ortho Exam  Specialty Comments:  No specialty comments available.  Imaging: XR Knee 1-2 Views Left  Result Date: 02/02/2021 Stable total knee replacement in good alignment.      PMFS History: Patient Active Problem List   Diagnosis Date Noted   Stiffness of left knee 08/18/2020   Status post total left knee replacement 05/23/2020   Primary osteoarthritis of left knee 05/05/2020   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   Fatty liver disease, nonalcoholic 41/63/8453   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   Trigger finger, right middle finger 10/10/2018   Other insomnia 06/23/2018   Vitamin D deficiency 03/26/2018   Prediabetes 03/11/2018   Obesity 02/26/2018   Migraines 10/01/2016   Decreased cardiac ejection fraction 10/01/2016   Severe obesity ([place]) 10/01/2016   Shingles 08/03/2016   Chronic pain of right knee 05/24/2016   Unilateral primary osteoarthritis, right knee 05/24/2016   Posterior tibial tendinitis, right leg 64/68/0321   Lichen sclerosus 22/48/2500   Past Medical History:  Diagnosis Date   Arthritis    bilateral knees, recent LCL sprain-on left knee   CHF (congestive heart failure) ([place])    Dysrhythmia    trigem   Environmental allergies    Family history of adverse reaction to anesthesia    slow to wake up   Fatty liver    Headache(784.0)    migraines-on meds for control, relpax, ketoprophen, vicoden, muscle relaxer   Hearing loss  2016   High frequency hearing - Bilateral    History of hiatal hernia    Hot flashes    Joint pain    Lichen sclerosus    Lipoma    Right Knee   Menorrhagia    Migraines    Necrobiosis lipoidica    Osteoarthritis    Overweight    Pelvic pain in female    PONV (postoperative nausea and vomiting)    ponv, slow to awake   Seasonal allergies    Sinus problem    Sprain 10/25/2010   left knee   Trigeminal [name]    Trigger finger, right    Middle finger and right wrist    Vitamin D deficiency     Family History  Problem Relation Age of Onset   Skin cancer Mother    Migraines Mother    High blood pressure Mother    Heart disease Father    High blood pressure Father     High Cholesterol Father    Breast cancer Maternal Aunt 70   Breast cancer Paternal Aunt 39   Migraines Brother     Past Surgical History:  Procedure Laterality Date   APPENDECTOMY     CHOLECYSTECTOMY     DIAGNOSTIC LAPAROSCOPY  08/2006   DILATION AND CURETTAGE OF UTERUS  2011   attempted ablation   FUNCTIONAL ENDOSCOPIC SINUS SURGERY     ganglion cyst removal     HYSTEROSCOPY     failed   KNEE ARTHROPLASTY Right 2018   meniscus   KNEE CLOSED REDUCTION Left 08/18/2020   Procedure: LEFT KNEE MANIPULATION UNDER ANESTHESIA;  Surgeon: Leandrew Koyanagi, MD;  Location: [facility];  Service: Orthopedics;  Laterality: Left;   LAPAROSCOPIC GASTRIC SLEEVE RESECTION  01/2020   LASIK     LIPOMA EXCISION Right 07/22/2014   Procedure: EXCISION RIGHT THIGH LIPOMA;  Surgeon: Erroll Luna, MD;  Location: [facility];  Service: General;  Laterality: Right;   ROBOTIC ASSISTED TOTAL HYSTERECTOMY  11/06/10   TLH/RSO   SALPINGOOPHORECTOMY  11/06/2010   Procedure: SALPINGO OOPHERECTOMY;  Surgeon: Felipa Emory;  Location: [facility];  Service: Gynecology;  Laterality: Right;   TOTAL KNEE ARTHROPLASTY Left 05/23/2020   Procedure: LEFT TOTAL KNEE ARTHROPLASTY;  Surgeon: Leandrew Koyanagi, MD;  Location: [facility];  Service: Orthopedics;  Laterality: Left;   TRIGGER FINGER RELEASE     UPPER GI ENDOSCOPY N/A 02/02/2020   Procedure: UPPER GI ENDOSCOPY;  Surgeon: [name], MD;  Location: [facility];  Service: General;  Laterality: N/A;   Social History   Occupational History   Occupation: Administrator, [employer]  Tobacco Use   Smoking status: Never   Smokeless tobacco: Never  Vaping Use   Vaping Use: Never used  Substance and Sexual Activity   Alcohol use: No    Alcohol/week: 0.0 standard drinks   Drug use: No   Sexual activity: Yes    Birth control/protection: Surgical    Comment: TLH/RSO

## 2021-03-10 DIAGNOSIS — I429 Cardiomyopathy, unspecified: Secondary | ICD-10-CM | POA: Insufficient documentation

## 2021-04-24 ENCOUNTER — Other Ambulatory Visit: Payer: Self-pay | Admitting: Cardiovascular Disease

## 2021-04-30 ENCOUNTER — Other Ambulatory Visit: Payer: Self-pay | Admitting: Neurology

## 2021-05-01 NOTE — Telephone Encounter (Signed)
Rx refilled.

## 2021-05-08 ENCOUNTER — Ambulatory Visit (HOSPITAL_BASED_OUTPATIENT_CLINIC_OR_DEPARTMENT_OTHER): Payer: BC Managed Care – PPO | Admitting: Obstetrics & Gynecology

## 2021-06-26 ENCOUNTER — Encounter (HOSPITAL_BASED_OUTPATIENT_CLINIC_OR_DEPARTMENT_OTHER): Payer: Self-pay | Admitting: Obstetrics & Gynecology

## 2021-06-26 ENCOUNTER — Ambulatory Visit (INDEPENDENT_AMBULATORY_CARE_PROVIDER_SITE_OTHER): Payer: BC Managed Care – PPO | Admitting: Obstetrics & Gynecology

## 2021-06-26 VITALS — BP 126/77 | HR 74 | Ht 64.5 in | Wt 231.4 lb

## 2021-06-26 DIAGNOSIS — L900 Lichen sclerosus et atrophicus: Secondary | ICD-10-CM

## 2021-06-26 DIAGNOSIS — Z1231 Encounter for screening mammogram for malignant neoplasm of breast: Secondary | ICD-10-CM

## 2021-06-26 DIAGNOSIS — E2839 Other primary ovarian failure: Secondary | ICD-10-CM | POA: Diagnosis not present

## 2021-06-26 DIAGNOSIS — Z01419 Encounter for gynecological examination (general) (routine) without abnormal findings: Secondary | ICD-10-CM

## 2021-06-26 DIAGNOSIS — Z0000 Encounter for general adult medical examination without abnormal findings: Secondary | ICD-10-CM | POA: Diagnosis not present

## 2021-06-26 MED ORDER — CLOBETASOL PROPIONATE 0.05 % EX CREA: 1.0000 "application " | TOPICAL_CREAM | CUTANEOUS | 1 refills | Status: DC | PRN

## 2021-06-26 MED ORDER — MOMETASONE FUROATE 0.1 % EX OINT: TOPICAL_OINTMENT | CUTANEOUS | 3 refills | Status: DC

## 2021-06-26 NOTE — Progress Notes (Signed)
61 y.o. G0P0000 Married White or Caucasian female here for annual exam.  Had gastric sleeve and gall bladder removal.  Lost about 50 pounds.   ? ?Denies vaginal bleeding.  Has intermittent vulvar itching.   ? ?Patient's last menstrual period was 10/14/2010.          ?The current method of family planning is status post hysterectomy.    ?Exercising: Yes.     ?Smoker:  no ? ?Health Maintenance: ?Pap:  no indicated ?History of abnormal Pap:  no ?MMG:  09/08/2020 Negative ?Colonoscopy:  10/14/2019 ?BMD:   plan with mammogram, ordered today ?Screening Labs: lab work ordered ? ? reports that she has never smoked. She has never used smokeless tobacco. She reports that she does not drink alcohol and does not use drugs. ? ?Past Medical History:  ?Diagnosis Date  ? Arthritis   ? bilateral knees, recent LCL sprain-on left knee  ? CHF (congestive heart failure) ([place])   ? Dysrhythmia   ? trigem  ? Environmental allergies   ? Family history of adverse reaction to anesthesia   ? slow to wake up  ? Fatty liver   ? Headache(784.0)   ? migraines-on meds for control, relpax, ketoprophen, vicoden, muscle relaxer  ? Hearing loss 2016  ? High frequency hearing - Bilateral   ? History of hiatal hernia   ? Hot flashes   ? Joint pain   ? Lichen sclerosus   ? Lipoma   ? Right Knee  ? Menorrhagia   ? Migraines   ? Necrobiosis lipoidica   ? Osteoarthritis   ? Overweight   ? Pelvic pain in female   ? PONV (postoperative nausea and vomiting)   ? ponv, slow to awake  ? Seasonal allergies   ? Sinus problem   ? Sprain 10/25/2010  ? left knee  ? Trigeminal pulse   ? Trigger finger, right   ? Middle finger and right wrist   ? Vitamin D deficiency   ? ? ?Past Surgical History:  ?Procedure Laterality Date  ? APPENDECTOMY    ? CHOLECYSTECTOMY    ? DIAGNOSTIC LAPAROSCOPY  08/2006  ? DILATION AND CURETTAGE OF UTERUS  2011  ? attempted ablation  ? FUNCTIONAL ENDOSCOPIC SINUS SURGERY    ? ganglion cyst removal    ? HYSTEROSCOPY    ? failed  ? KNEE  ARTHROPLASTY Right 2018  ? meniscus  ? KNEE CLOSED REDUCTION Left 08/18/2020  ? Procedure: LEFT KNEE MANIPULATION UNDER ANESTHESIA;  Surgeon: Leandrew Koyanagi, MD;  Location: [place] [facility];  Service: Orthopedics;  Laterality: Left;  ? LAPAROSCOPIC GASTRIC SLEEVE RESECTION  01/2020  ? LASIK    ? LIPOMA EXCISION Right 07/22/2014  ? Procedure: EXCISION RIGHT THIGH LIPOMA;  Surgeon: Erroll Luna, MD;  Location: [place];  Service: General;  Laterality: Right;  ? ROBOTIC ASSISTED TOTAL HYSTERECTOMY  11/06/10  ? TLH/RSO  ? SALPINGOOPHORECTOMY  11/06/2010  ? Procedure: SALPINGO OOPHERECTOMY;  Surgeon: Felipa Emory;  Location: [facility];  Service: Gynecology;  Laterality: Right;  ? TOTAL KNEE ARTHROPLASTY Left 05/23/2020  ? Procedure: LEFT TOTAL KNEE ARTHROPLASTY;  Surgeon: Leandrew Koyanagi, MD;  Location: [place];  Service: Orthopedics;  Laterality: Left;  ? TRIGGER FINGER RELEASE    ? UPPER GI ENDOSCOPY N/A 02/02/2020  ? Procedure: UPPER GI ENDOSCOPY;  Surgeon: [name], MD;  Location: [facility];  Service: General;  Laterality: N/A;  ? ? ?Current Outpatient Medications  ?Medication Sig Dispense Refill  ?  amoxicillin (AMOXIL) 500 MG tablet Take four pills one hour prior to dental work 8 tablet 2  ? Ca Phosphate-Cholecalciferol (CALCIUM 500 + D3) 250-500 MG-UNIT CHEW Chew 1 tablet by mouth in the morning, at noon, and at bedtime. Bariatric Calcium Citrate chewy    ? carvedilol (COREG) 6.25 MG tablet TAKE 1 TABLET TWICE DAILY  WITH MEALS 180 tablet 1  ? clobetasol cream (TEMOVATE) 7.20 % Apply 1 application topically as needed (lichen sclerosus). (Patient taking differently: Apply 1 application. topically in the morning and at bedtime.) 60 g 1  ? eletriptan (RELPAX) 40 MG tablet TAKE 1 TABLET TWICE A DAY  AS NEEDED FOR MIGRAINE OR  HEADACHE 6 tablet 9  ? furosemide (LASIX) 20 MG tablet Take 0.5 tablets (10 mg total) by mouth daily as needed for edema. 45 tablet 3  ? hyoscyamine (LEVSIN) 0.125 MG tablet Take  0.125 mg by mouth every 4 (four) hours as needed (abdominal cramps/spasms).    ? lisinopril (ZESTRIL) 10 MG tablet TAKE 1 TABLET DAILY 90 tablet 3  ? mometasone (ELOCON) 0.1 % ointment Use small topically twice weekly for maintenance. (Patient taking differently: Apply 1 application. topically See admin instructions. Use small topically twice weekly as needed for irritation) 45 g 3  ? Multiple Vitamins-Minerals (BARIATRIC MULTIVITAMINS/IRON PO) Take 1 tablet by mouth in the morning. BariMelts Multivitamin W/Iron (chewable)    ? multivitamin-lutein (OCUVITE-LUTEIN) CAPS capsule Take 1 capsule by mouth in the morning.    ? tiZANidine (ZANAFLEX) 4 MG tablet Take 1.5 tablets (6 mg total) by mouth every 8 (eight) hours as needed for muscle spasms. 60 tablet 6  ? ?No current facility-administered medications for this visit.  ? ? ?Family History  ?Problem Relation Age of Onset  ? Skin cancer Mother   ? Migraines Mother   ? High blood pressure Mother   ? Heart disease Father   ? High blood pressure Father   ? High Cholesterol Father   ? Breast cancer Maternal Aunt 87  ? Breast cancer Paternal Aunt 95  ? Migraines Brother   ? ? ?Genitourinary:vulvar itching ? ?Exam:   ?BP 126/77 (BP Location: Left Arm, Patient Position: Sitting, Cuff Size: Large)   Pulse 74   Ht 5' 4.5" (1.638 m) Comment: reported  Wt 231 lb 6.4 oz (105 kg)   LMP 10/14/2010   BMI 39.11 kg/m?   Height: 5' 4.5" (163.8 cm) (reported) ? ?General appearance: alert, cooperative and appears stated age ?Head: Normocephalic, without obvious abnormality, atraumatic ?Neck: no adenopathy, supple, symmetrical, trachea midline and thyroid normal to inspection and palpation ?Lungs: clear to auscultation bilaterally ?Breasts: normal appearance, no masses or tenderness ?Heart: regular rate and rhythm ?Abdomen: soft, non-tender; bowel sounds normal; no masses,  no organomegaly ?Extremities: extremities normal, atraumatic, no cyanosis or edema ?Skin: Skin color, texture,  turgor normal. No rashes or lesions ?Lymph nodes: Cervical, supraclavicular, and axillary nodes normal. ?No abnormal inguinal nodes palpated ?Neurologic: Grossly normal ? ? ?Pelvic: External genitalia: hypopigmented skin changes in inner labia majora and around the rectum, mildly thickening left labia majora ?             Urethra:  normal appearing urethra with no masses, tenderness or lesions ?             Bartholins and Skenes: normal    ?             Vagina: normal appearing vagina with normal color and no discharge, no lesions ?  Cervix: absent ?             Pap taken: No. ?Bimanual Exam:  Uterus:  uterus absent ?             Adnexa: no mass, fullness, tenderness ?              Rectovaginal: Confirms ?              Anus:  normal sphincter tone, no lesions ? ?Chaperone, Octaviano Batty, CMA, was present for exam. ? ?Assessment/Plan: ?1. Well female exam with routine gynecological exam ?- pap not indicated ?- MMG up to date ?- colonoscopy 2021 ?- BMD ordered ?- vaccines reviewed/updated ? ?2. Hypoestrogenism ?- DG BONE DENSITY (DXA); Future ? ?3. Encounter for screening mammogram for malignant neoplasm of breast ?- MM 3D SCREEN BREAST BILATERAL; Future ? ?4. Blood tests for routine general physical examination ?- Lipid panel; Future ?- TSH; Future ? ?5. Lichen sclerosus ?- pt will use clobetasol twice daily for 1 month and return for recheck ?- mometasone (ELOCON) 0.1 % ointment; Use small topically twice weekly for maintenance.  Dispense: 45 g; Refill: 3 ?- clobetasol cream (TEMOVATE) 0.05 %; Apply 1 application. topically as needed (lichen sclerosus). Use when having a flare.  Do not use for more than 5 days.  Dispense: 60 g; Refill: 1 ? ? ? ?

## 2021-06-28 ENCOUNTER — Telehealth: Payer: Self-pay | Admitting: Orthopaedic Surgery

## 2021-06-28 NOTE — Telephone Encounter (Signed)
Pt called asking to see another dr because Dr Erlinda Hong has no opening until 5/24. Pt is having pains in right knee. Pt states she heard a pop. Please call pt at [phone]. ?

## 2021-06-30 ENCOUNTER — Ambulatory Visit (INDEPENDENT_AMBULATORY_CARE_PROVIDER_SITE_OTHER): Payer: BC Managed Care – PPO

## 2021-06-30 ENCOUNTER — Ambulatory Visit (INDEPENDENT_AMBULATORY_CARE_PROVIDER_SITE_OTHER): Payer: BC Managed Care – PPO | Admitting: Physician Assistant

## 2021-06-30 ENCOUNTER — Encounter: Payer: Self-pay | Admitting: Physician Assistant

## 2021-06-30 DIAGNOSIS — M25561 Pain in right knee: Secondary | ICD-10-CM

## 2021-06-30 DIAGNOSIS — M1711 Unilateral primary osteoarthritis, right knee: Secondary | ICD-10-CM

## 2021-06-30 MED ORDER — BUPIVACAINE HCL 0.25 % IJ SOLN
2.0000 mL | INTRAMUSCULAR | Status: AC | PRN
  Administered 2021-06-30: 2 mL via INTRA_ARTICULAR

## 2021-06-30 MED ORDER — METHYLPREDNISOLONE ACETATE 40 MG/ML IJ SUSP
80.0000 mg | INTRAMUSCULAR | Status: AC | PRN
  Administered 2021-06-30: 80 mg via INTRA_ARTICULAR

## 2021-06-30 MED ORDER — LIDOCAINE HCL 1 % IJ SOLN
2.0000 mL | INTRAMUSCULAR | Status: AC | PRN
  Administered 2021-06-30: 2 mL

## 2021-06-30 NOTE — Progress Notes (Signed)
Office Visit Note   Patient: Savannah [name]           Date of Birth: August 17, 1960           MRN: [id number] Visit Date: 06/30/2021              Requested by: Lawerance Cruel, [place],  [place] [id number] PCP: Lawerance Cruel, MD  Chief Complaint  Patient presents with  . Right Knee - Pain      HPI: Patient is a very pleasant 60 year old woman who is a patient of Dr. Erlinda Hong.  She is status post left knee replacement with him and other than some stiffness in this knee she is doing fairly well.  She comes in today complaining of right knee pain.  She has had an arthroscopy on this knee right knee in the past.  She said she was sitting and got up and heard a pop in her knee.  It feels just a little bit better today but it is forcing her to use a cane to ambulate she denies any fever chills  Assessment & Plan: Visit Diagnoses:  1. Acute pain of right knee   2. Arthritis of right knee     Plan: Findings consistent with arthritis of the right knee which has progressed since previous films.  Most of her pain seems to be on the medial compartment.  She has good stability.  I recommended steroid injection today and she is agreeable to this she also says she is using walking funny she has some pain going down the top of her leg lower leg.  She does have muscle relaxants at home which she can take at night for the tightness.  Denies any calf pain.  Follow-Up Instructions: No follow-ups on file.   Ortho Exam  Patient is alert, oriented, no adenopathy, well-dressed, normal affect, normal respiratory effort. Examination demonstrates no effusion no warmth over her right knee.  Slight varus clinical alignment.  She is lacking just a touch of extension.  She is able to flex to 100 degrees.  She has good varus valgus stability she is most tender over the anterior medial joint line cannot appreciate an effusion no erythema.  Compartments of her lower leg are soft and supple negative  Homans' sign  Imaging: XR KNEE 3 VIEW RIGHT  Result Date: 06/30/2021 2 view radiographs of her right knee were obtained today.  She is status post left total knee replacement.  Right knee demonstrates slight varus alignment with tricompartmental arthritis most significant degenerative changes in the medial and patellofemoral joints.  There is subchondral sclerosis and Peri articular osteophyte formation no acute fractures noted  No images are attached to the encounter.  Labs: Lab Results  Component Value Date   HGBA1C 5.6 07/21/2018   HGBA1C 5.8 (H) 02/24/2018     Lab Results  Component Value Date   ALBUMIN 3.8 05/17/2020   ALBUMIN 3.6 02/03/2020   ALBUMIN 4.0 01/28/2020   PREALBUMIN 17 05/05/2020    Lab Results  Component Value Date   MG 2.2 11/06/2010   Lab Results  Component Value Date   VD25OH 58.1 07/21/2018   VD25OH 27.9 (L) 02/24/2018   VD25OH 32.2 10/18/2017    Lab Results  Component Value Date   PREALBUMIN 17 05/05/2020      Latest Ref Rng & Units 05/24/2020    7:54 AM 05/17/2020    3:36 PM 02/03/2020    5:26 AM  CBC  EXTENDED  WBC 4.0 - 10.5 K/uL 13.4   9.4   16.7    RBC 3.87 - 5.11 MIL/uL 4.29   4.55   4.05    Hemoglobin 12.0 - 15.0 g/dL 12.7   13.8   12.3    HCT 36.0 - 46.0 % 39.4   42.2   38.8    Platelets 150 - 400 K/uL 238   232   255    NEUT# 1.7 - 7.7 K/uL  6.4   14.1    Lymph# 0.7 - 4.0 K/uL  2.1   1.5       There is no height or weight on file to calculate BMI.  Orders:  Orders Placed This Encounter  Procedures  . XR KNEE 3 VIEW RIGHT   No orders of the defined types were placed in this encounter.    Procedures: Large Joint Inj: R knee on 06/30/2021 9:43 AM Indications: pain and diagnostic evaluation Details: 25 G 1.5 in needle, anteromedial approach  Arthrogram: No  Medications: 80 mg methylPREDNISolone acetate 40 MG/ML; 2 mL lidocaine 1 %; 2 mL bupivacaine 0.25 % Outcome: tolerated well, no immediate complications Consent was  given by the patient. Patient was prepped and draped in the usual sterile fashion.    Clinical Data: No additional findings.  ROS:  All other systems negative, except as noted in the HPI. Review of Systems  Objective: Vital Signs: LMP 10/14/2010   Specialty Comments:  No specialty comments available.  PMFS History: Patient Active Problem List   Diagnosis Date Noted  . Arthritis of right knee 06/30/2021  . Cardiomyopathy ([place]) 03/10/2021  . Stiffness of left knee 08/18/2020  . Status post total left knee replacement 05/23/2020  . Primary osteoarthritis of left knee 05/05/2020  . 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 Fatty liver disease, nonalcoholic 26/37/8588  . 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  . Trigger finger, right middle finger 10/10/2018  . Other insomnia 06/23/2018  . Vitamin D deficiency 03/26/2018  . Prediabetes 03/11/2018  . Obesity 02/26/2018  . Migraines 10/01/2016  . Decreased cardiac ejection fraction 10/01/2016  . Severe obesity ([facility] [facility]) 10/01/2016  . Shingles 08/03/2016  . Chronic pain of right knee 05/24/2016  . Unilateral primary osteoarthritis, right knee 05/24/2016  . Posterior tibial tendinitis, right leg 01/30/2016  . Lichen sclerosus 50/27/7412   Past Medical History:  Diagnosis Date  . Arthritis    bilateral knees, recent LCL sprain-on left knee  . CHF (congestive heart failure) ([facility])   . Dysrhythmia    trigem  . Environmental allergies   . Family history of adverse reaction to anesthesia    slow to wake up  . Fatty liver   . Headache(784.0)    migraines-on meds for control, relpax, ketoprophen, vicoden, muscle relaxer  . Hearing loss 2016   High frequency hearing - Bilateral   . History of hiatal hernia   . Hot flashes   . Joint pain   . Lichen sclerosus   . Lipoma    Right Knee  . Menorrhagia   . Migraines   . Necrobiosis lipoidica   . Osteoarthritis   . Overweight   . Pelvic pain in female   . PONV  (postoperative nausea and vomiting)    ponv, slow to awake  . Seasonal allergies   . Sinus problem   . Sprain 10/25/2010   left knee  . Trigeminal pulse   . Trigger finger, right    Middle finger and right  wrist   . Vitamin D deficiency     Family History  Problem Relation Age of Onset  . Skin cancer Mother   . Migraines Mother   . High blood pressure Mother   . Heart disease Father   . High blood pressure Father   . High Cholesterol Father   . Breast cancer Maternal Aunt 70  . Breast cancer Paternal Aunt 46  . Migraines Brother     Past Surgical History:  Procedure Laterality Date  . APPENDECTOMY    . CHOLECYSTECTOMY    . DIAGNOSTIC LAPAROSCOPY  08/2006  . DILATION AND CURETTAGE OF UTERUS  2011   attempted ablation  . FUNCTIONAL ENDOSCOPIC SINUS SURGERY    . ganglion cyst removal    . HYSTEROSCOPY     failed  . KNEE ARTHROPLASTY Right 2018   meniscus  . KNEE CLOSED REDUCTION Left 08/18/2020   Procedure: LEFT KNEE MANIPULATION UNDER ANESTHESIA;  Surgeon: Leandrew Koyanagi, MD;  Location: [facility];  Service: Orthopedics;  Laterality: Left;  . LAPAROSCOPIC GASTRIC SLEEVE RESECTION  01/2020  . LASIK    . LIPOMA EXCISION Right 07/22/2014   Procedure: EXCISION RIGHT THIGH LIPOMA;  Surgeon: Erroll Luna, MD;  Location: [facility];  Service: General;  Laterality: Right;  . ROBOTIC ASSISTED TOTAL HYSTERECTOMY  11/06/10   TLH/RSO  . SALPINGOOPHORECTOMY  11/06/2010   Procedure: SALPINGO OOPHERECTOMY;  Surgeon: Felipa Emory;  Location: [facility];  Service: Gynecology;  Laterality: Right;  . TOTAL KNEE ARTHROPLASTY Left 05/23/2020   Procedure: LEFT TOTAL KNEE ARTHROPLASTY;  Surgeon: Leandrew Koyanagi, MD;  Location: [facility];  Service: Orthopedics;  Laterality: Left;  . TRIGGER FINGER RELEASE    . UPPER GI ENDOSCOPY N/A 02/02/2020   Procedure: UPPER GI ENDOSCOPY;  Surgeon: [name], MD;  Location: [facility];  Service: General;  Laterality: N/A;   Social History    Occupational History  . Occupation: Administrator, [employer]  Tobacco Use  . Smoking status: Never  . Smokeless tobacco: Never  Vaping Use  . Vaping Use: Never used  Substance and Sexual Activity  . Alcohol use: No    Alcohol/week: 0.0 standard drinks  . Drug use: No  . Sexual activity: Yes    Birth control/protection: Surgical    Comment: TLH/RSO

## 2021-07-26 ENCOUNTER — Encounter (HOSPITAL_BASED_OUTPATIENT_CLINIC_OR_DEPARTMENT_OTHER): Payer: Self-pay | Admitting: Obstetrics & Gynecology

## 2021-07-26 ENCOUNTER — Ambulatory Visit (INDEPENDENT_AMBULATORY_CARE_PROVIDER_SITE_OTHER): Payer: BC Managed Care – PPO | Admitting: Obstetrics & Gynecology

## 2021-07-26 ENCOUNTER — Other Ambulatory Visit (HOSPITAL_COMMUNITY)
Admission: RE | Admit: 2021-07-26 | Discharge: 2021-07-26 | Disposition: A | Discharge status: Home / Self Care | Payer: BC Managed Care – PPO | Source: Ambulatory Visit | Attending: Obstetrics & Gynecology | Admitting: Obstetrics & Gynecology

## 2021-07-26 VITALS — BP 112/59 | HR 71 | Ht 65.0 in | Wt 228.6 lb

## 2021-07-26 DIAGNOSIS — Z1231 Encounter for screening mammogram for malignant neoplasm of breast: Secondary | ICD-10-CM

## 2021-07-26 DIAGNOSIS — L292 Pruritus vulvae: Secondary | ICD-10-CM | POA: Diagnosis present

## 2021-07-26 DIAGNOSIS — L900 Lichen sclerosus et atrophicus: Secondary | ICD-10-CM | POA: Diagnosis not present

## 2021-07-26 DIAGNOSIS — E2839 Other primary ovarian failure: Secondary | ICD-10-CM

## 2021-07-26 DIAGNOSIS — Z0000 Encounter for general adult medical examination without abnormal findings: Secondary | ICD-10-CM

## 2021-07-26 NOTE — Progress Notes (Signed)
GYNECOLOGY  VISIT  CC:   follow up  HPI: 61 y.o. G0P0000 Married White or Caucasian female here for vulvar recheck.  Reports itching is much better.  Has been using clobetasol about twice daily.  Forgets sometimes.  No discharge or vaginal bleeding.  Not sure if should still be using the clobetasol or transitioning to using mometasone at this point.  Does need some lab work obtained today.  Orders have already been placed for these.   Past Medical History:  Diagnosis Date   Arthritis    bilateral knees, recent LCL sprain-on left knee   CHF (congestive heart failure) ([facility])    Dysrhythmia    trigem   Environmental allergies    Family history of adverse reaction to anesthesia    slow to wake up   Fatty liver    Headache(784.0)    migraines-on meds for control, relpax, ketoprophen, vicoden, muscle relaxer   Hearing loss 2016   High frequency hearing - Bilateral    History of hiatal hernia    Hot flashes    Joint pain    Lichen sclerosus    Lipoma    Right Knee   Menorrhagia    Migraines    Necrobiosis lipoidica    Osteoarthritis    Overweight    Pelvic pain in female    PONV (postoperative nausea and vomiting)    ponv, slow to awake   Seasonal allergies    Sinus problem    Sprain 10/25/2010   left knee   Trigeminal pulse    Trigger finger, right    Middle finger and right wrist    Vitamin D deficiency     MEDS:   Current Outpatient Medications on File Prior to Visit  Medication Sig Dispense Refill   amoxicillin (AMOXIL) 500 MG tablet Take four pills one hour prior to dental work 8 tablet 2   Ca Phosphate-Cholecalciferol (CALCIUM 500 + D3) 250-500 MG-UNIT CHEW Chew 1 tablet by mouth in the morning, at noon, and at bedtime. Bariatric Calcium Citrate chewy     carvedilol (COREG) 6.25 MG tablet TAKE 1 TABLET TWICE DAILY  WITH MEALS 180 tablet 1   clobetasol cream (TEMOVATE) 1.06 % Apply 1 application. topically as needed (lichen sclerosus). Use when having a flare.  Do  not use for more than 5 days. 60 g 1   eletriptan (RELPAX) 40 MG tablet TAKE 1 TABLET TWICE A DAY  AS NEEDED FOR MIGRAINE OR  HEADACHE 6 tablet 9   furosemide (LASIX) 20 MG tablet Take 0.5 tablets (10 mg total) by mouth daily as needed for edema. 45 tablet 3   hyoscyamine (LEVSIN) 0.125 MG tablet Take 0.125 mg by mouth every 4 (four) hours as needed (abdominal cramps/spasms).     lisinopril (ZESTRIL) 10 MG tablet TAKE 1 TABLET DAILY 90 tablet 3   mometasone (ELOCON) 0.1 % ointment Use small topically twice weekly for maintenance. 45 g 3   Multiple Vitamins-Minerals (BARIATRIC MULTIVITAMINS/IRON PO) Take 1 tablet by mouth in the morning. BariMelts Multivitamin W/Iron (chewable)     multivitamin-lutein (OCUVITE-LUTEIN) CAPS capsule Take 1 capsule by mouth in the morning.     tiZANidine (ZANAFLEX) 4 MG tablet Take 1.5 tablets (6 mg total) by mouth every 8 (eight) hours as needed for muscle spasms. 60 tablet 6   No current facility-administered medications on file prior to visit.    ALLERGIES: Dust mite extract and Nsaids  SH:  married, non somker  Review of Systems  Constitutional:  Negative.   Genitourinary: Negative.     PHYSICAL EXAMINATION:    BP (!) 112/59 (BP Location: Right Arm, Patient Position: Sitting, Cuff Size: Large)   Pulse 71   Ht '5\' 5"'$  (1.651 m) Comment: reported  Wt 228 lb 9.6 oz (103.7 kg)   LMP 10/14/2010   BMI 38.04 kg/m     General appearance: alert, cooperative and appears stated age Lymph:  no inguinal LAD noted  Pelvic: External genitalia:  hypopigmentation of inner labia majora from superior portion, in a kissing pattern, down to peri-rectal region.  No thickened skin.  Small circular area on left inner labia majora of erythema.  (Pt does report this is where the most significant itching is present)              Urethra:  normal appearing urethra with no masses, tenderness or lesions              Bartholins and Skenes: normal                  Due to area of  erythema within lichen sclerosus skin changes, biopsy recommended.  Consent obtained.  Procedure:  Area cleansed with Betadine.  Sterile technique used throughout procedure.  Skin anesthestized with Lidocaine 1% plain; 1.24m.  3 punch biopsy used to obtain specimen.  Biopsy grasped with pick-ups and excised with scissors.  Adequate hemostasis obtained with silver nitrate sticks.  Dressing was not applied.    Chaperone, KEzekiel Ina RN, was present for exam.  Assessment/Plan: 1. Vulvar itching - pt will continue BID clobetasol use for two more weeks, then decrease to nightly for 4 weeks and then transition to mometasone twice weekly.  She already has prescriptions for these.  Instructions written for pt. - Surgical pathology( [place]/ [facility])  2. Blood tests for routine general physical examination - TSH - Lipid panel

## 2021-07-26 NOTE — Patient Instructions (Addendum)
Continue clobetasol twice daily for two more weeks.  Then decrease to nightly for 4 weeks.  After that time, start using the mometasone twice weekly for maintenance and hopefully to maintain control of symptoms/skin changes.

## 2021-07-27 ENCOUNTER — Other Ambulatory Visit (HOSPITAL_BASED_OUTPATIENT_CLINIC_OR_DEPARTMENT_OTHER): Payer: Self-pay

## 2021-07-27 ENCOUNTER — Encounter (HOSPITAL_BASED_OUTPATIENT_CLINIC_OR_DEPARTMENT_OTHER): Payer: Self-pay

## 2021-07-27 LAB — LIPID PANEL
Chol/HDL Ratio: 3.3 ratio (ref 0.0–4.4)
Cholesterol, Total: 170 mg/dL (ref 100–199)
HDL: 52 mg/dL (ref >39)
LDL Chol Calc (NIH): 101 mg/dL — ABNORMAL HIGH (ref 0–99)
Triglycerides: 92 mg/dL (ref 0–149)
VLDL Cholesterol Cal: 17 mg/dL (ref 5–40)

## 2021-07-27 LAB — TSH: TSH: 3.94 u[IU]/mL (ref 0.450–4.500)

## 2021-07-27 MED ORDER — WEGOVY 1.7 MG/0.75ML ~~LOC~~ SOAJ
SUBCUTANEOUS | 0 refills | Status: DC
Start: 2021-07-27 — End: 2021-08-18
  Filled 2021-07-27: qty 3, 28d supply, fill #0

## 2021-07-28 LAB — SURGICAL PATHOLOGY

## 2021-08-03 ENCOUNTER — Encounter: Payer: Self-pay | Admitting: Physician Assistant

## 2021-08-03 ENCOUNTER — Ambulatory Visit (INDEPENDENT_AMBULATORY_CARE_PROVIDER_SITE_OTHER): Payer: BC Managed Care – PPO

## 2021-08-03 ENCOUNTER — Ambulatory Visit: Payer: BC Managed Care – PPO | Admitting: Orthopaedic Surgery

## 2021-08-03 ENCOUNTER — Encounter: Payer: Self-pay | Admitting: Orthopaedic Surgery

## 2021-08-03 DIAGNOSIS — Z96652 Presence of left artificial knee joint: Secondary | ICD-10-CM

## 2021-08-03 NOTE — Progress Notes (Unsigned)
Post-Op Visit Note   Patient: Savannah [name]           Date of Birth: 12-08-1960           MRN: [id number] Visit Date: 08/03/2021 PCP: Lawerance Cruel, MD   Assessment & Plan:  Chief Complaint:  Chief Complaint  Patient presents with   Left Knee - Follow-up    Left total knee arthroplasty 05/23/2020   Visit Diagnoses:  1. Status post total left knee replacement     Plan: Patient is a pleasant 61 year old female who comes in today little over 1 year status post left total knee replacement 05/23/2020 and left knee manipulation 08/18/2020.  She has been doing okay but still continues to struggle with "tightness and discomfort "to the lateral knee.  She has been to physical therapy where they have performed dry needling which sometimes helps.  She is also been seeing a massage therapist weekly who massages the lateral knee.  She continues to work on a home exercise program.  Examination of the left knee reveals range of motion from 0 to 110 degrees.  She is stable to valgus varus stress.  She does not have any joint line tenderness.  She is neurovascular intact distally.  At this point, [name] is possibly interested in synovectomy.  I have discussed that I need to speak to Dr. Sherrian Divers about this matter.  We will be in touch with her in the near future.  Dental prophylaxis reinforced.  Follow-Up Instructions: Return in about 6 months (around 02/02/2022).   Orders:  Orders Placed This Encounter  Procedures   XR KNEE 3 VIEW LEFT   No orders of the defined types were placed in this encounter.   Imaging: XR KNEE 3 VIEW LEFT  Result Date: 08/03/2021 Well-seated prosthesis without complication   PMFS History: Patient Active Problem List   Diagnosis Date Noted   Arthritis of right knee 06/30/2021   Cardiomyopathy ([place]) 03/10/2021   Stiffness of left knee 08/18/2020   Status post total left knee replacement 05/23/2020   Primary osteoarthritis of left knee 05/05/2020   S/P laparoscopic  sleeve gastrectomy 02/02/2020   Fatty liver disease, nonalcoholic 78/29/5621   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   Trigger finger, right middle finger 10/10/2018   Other insomnia 06/23/2018   Vitamin D deficiency 03/26/2018   Prediabetes 03/11/2018   Obesity 02/26/2018   Migraines 10/01/2016   Decreased cardiac ejection fraction 10/01/2016   Severe obesity ([place]) 10/01/2016   Shingles 08/03/2016   Chronic pain of right knee 05/24/2016   Unilateral primary osteoarthritis, right knee 05/24/2016   Posterior tibial tendinitis, right leg 30/86/5784   Lichen sclerosus 69/62/9528   Past Medical History:  Diagnosis Date   Arthritis    bilateral knees, recent LCL sprain-on left knee   CHF (congestive heart failure) ([place])    Dysrhythmia    trigem   Environmental allergies    Family history of adverse reaction to anesthesia    slow to wake up   Fatty liver    Headache(784.0)    migraines-on meds for control, relpax, ketoprophen, vicoden, muscle relaxer   Hearing loss 2016   High frequency hearing - Bilateral    History of hiatal hernia    Hot flashes    Joint pain    Lichen sclerosus    Lipoma    Right Knee   Menorrhagia    Migraines    Necrobiosis lipoidica    Osteoarthritis    Overweight  Pelvic pain in female    PONV (postoperative nausea and vomiting)    ponv, slow to awake   Seasonal allergies    Sinus problem    Sprain 10/25/2010   left knee   Trigeminal pulse    Trigger finger, right    Middle finger and right wrist    Vitamin D deficiency     Family History  Problem Relation Age of Onset   Skin cancer Mother    Migraines Mother    High blood pressure Mother    Heart disease Father    High blood pressure Father    High Cholesterol Father    Breast cancer Maternal Aunt 70   Breast cancer Paternal Aunt 67   Migraines Brother     Past Surgical History:  Procedure Laterality Date   APPENDECTOMY     CHOLECYSTECTOMY     DIAGNOSTIC LAPAROSCOPY   08/2006   DILATION AND CURETTAGE OF UTERUS  2011   attempted ablation   FUNCTIONAL ENDOSCOPIC SINUS SURGERY     ganglion cyst removal     HYSTEROSCOPY     failed   KNEE ARTHROPLASTY Right 2018   meniscus   KNEE CLOSED REDUCTION Left 08/18/2020   Procedure: LEFT KNEE MANIPULATION UNDER ANESTHESIA;  Surgeon: Leandrew Koyanagi, MD;  Location: [place];  Service: Orthopedics;  Laterality: Left;   LAPAROSCOPIC GASTRIC SLEEVE RESECTION  01/2020   LASIK     LIPOMA EXCISION Right 07/22/2014   Procedure: EXCISION RIGHT THIGH LIPOMA;  Surgeon: Erroll Luna, MD;  Location: [place];  Service: General;  Laterality: Right;   ROBOTIC ASSISTED TOTAL HYSTERECTOMY  11/06/10   TLH/RSO   SALPINGOOPHORECTOMY  11/06/2010   Procedure: SALPINGO OOPHERECTOMY;  Surgeon: Felipa Emory;  Location: [facility];  Service: Gynecology;  Laterality: Right;   TOTAL KNEE ARTHROPLASTY Left 05/23/2020   Procedure: LEFT TOTAL KNEE ARTHROPLASTY;  Surgeon: Leandrew Koyanagi, MD;  Location: [place];  Service: Orthopedics;  Laterality: Left;   TRIGGER FINGER RELEASE     UPPER GI ENDOSCOPY N/A 02/02/2020   Procedure: UPPER GI ENDOSCOPY;  Surgeon: [name], MD;  Location: [facility];  Service: General;  Laterality: N/A;   Social History   Occupational History   Occupation: Administrator, [employer]  Tobacco Use   Smoking status: Never   Smokeless tobacco: Never  Vaping Use   Vaping Use: Never used  Substance and Sexual Activity   Alcohol use: No    Alcohol/week: 0.0 standard drinks of alcohol   Drug use: No   Sexual activity: Yes    Birth control/protection: Surgical    Comment: TLH/RSO

## 2021-08-18 ENCOUNTER — Other Ambulatory Visit (HOSPITAL_BASED_OUTPATIENT_CLINIC_OR_DEPARTMENT_OTHER): Payer: Self-pay

## 2021-08-18 MED ORDER — WEGOVY 1.7 MG/0.75ML ~~LOC~~ SOAJ
SUBCUTANEOUS | 0 refills | Status: DC
  Filled 2021-08-18: qty 3, 28d supply, fill #0

## 2021-09-04 ENCOUNTER — Ambulatory Visit: Payer: BC Managed Care – PPO | Admitting: Neurology

## 2021-09-05 ENCOUNTER — Ambulatory Visit: Payer: BC Managed Care – PPO | Admitting: Neurology

## 2021-09-06 NOTE — Progress Notes (Unsigned)
PATIENT: Savannah [name] DOB: [birth year]  REASON FOR VISIT: follow up HISTORY FROM: patient Primary Neurologist: Dr. Kennon Holter  HISTORY OF PRESENT ILLNESS: Today 09/07/21  Savannah [name] is here today for follow-up. Is back to work full time. Last few weeks, more headaches. Thinks related to heat, work stress busy season. Takes Relpax with very good benefit. Not interested in another preventative. Has lost 12 lbs in 2 months with Wegovy. Has a cousin who has been diagnosed with hereditary hematochromatosis, she is having labs checked today. Another cousin diagnosed with malignant hyperthermia. Gets 6 Relpax tablets a month, doesn't use them all.   Update 09/01/20 SS: Savannah [name] here today to follow-up on migraine headaches, is doing excellent.  She had bariatric surgery in December 2021.  She stopped Effexor prior to bariatric surgery due to the pill being too large.  Fortunately, she has had very few headaches.  Last week, she did have a cluster of migraines, but Relpax worked excellent, otherwise no migraines.  She has done well to lose about 60 pounds.  In April 2022, she had a left knee replacement, had scar tissue development, had to have it removed recently.  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  She is not sure if her headaches will increase when she goes back full-time.  Here today for evaluation unaccompanied.  Update 09/01/2019 SS: Savannah [name] is a 61 year old female history of migraine headaches.  She is on Effexor and Relpax as needed.  She has previously failed Topamax.  Over the last several weeks, her headaches have increased, likely related to work-related stress.  On average now having 2-3 migraines a week.  She takes Relpax with good benefit.  She is considering bariatric surgery.  She has chronic left knee pain, needs replacement.  Seeing chiropractor tomorrow.  Presents today for evaluation unaccompanied for  HISTORY 08/26/2018 SS: Savannah [name] is a 61 year old female with  history of migraine headaches.  She has failed Topamax in the past.  She remains on Effexor 75 mg daily, Relpax as needed. She has had 2 headache in the last 6 weeks. This is a good control for her.  She will take Relpax and the headaches subside. She says triggers are stress and weather. 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 Her overall health has been good since last visit. She is in a weight reduction program. She is now down 25 lbs in the last 6 months. She is having surgery for trigger finger next week, planning left knee replacement if she loses more weight. On average she may take 2 relpax per month. She sees her women's health doctor for primary care.  She presents for follow-up unaccompanied.   REVIEW OF SYSTEMS: Out of a complete 14 system review of symptoms, the patient complains only of the following symptoms, and all other reviewed systems are negative.  See HPI  ALLERGIES: Allergies  Allergen Reactions   Dust Mite Extract Other (See Comments)    Environmental allergies   Nsaids Other (See Comments)    avoided due to bariatric surgery     HOME MEDICATIONS: Outpatient Medications Prior to Visit  Medication Sig Dispense Refill   amoxicillin (AMOXIL) 500 MG tablet Take four pills one hour prior to dental work 8 tablet 2   Ca Phosphate-Cholecalciferol (CALCIUM 500 + D3) 250-500 MG-UNIT CHEW Chew 1 tablet by mouth in the morning, at noon, and at bedtime. Bariatric Calcium Citrate chewy  carvedilol (COREG) 6.25 MG tablet TAKE 1 TABLET TWICE DAILY  WITH MEALS 180 tablet 1   clobetasol cream (TEMOVATE) 2.97 % Apply 1 application. topically as needed (lichen sclerosus). Use when having a flare.  Do not use for more than 5 days. 60 g 1   furosemide (LASIX) 20 MG tablet Take 0.5 tablets (10 mg total) by mouth daily as needed for edema. 45 tablet 3   hyoscyamine (LEVSIN) 0.125 MG tablet Take 0.125 mg by mouth every 4 (four) hours as needed (abdominal  cramps/spasms).     lisinopril (ZESTRIL) 10 MG tablet TAKE 1 TABLET DAILY 90 tablet 3   mometasone (ELOCON) 0.1 % ointment Use small topically twice weekly for maintenance. 45 g 3   Multiple Vitamins-Minerals (BARIATRIC MULTIVITAMINS/IRON PO) Take 1 tablet by mouth in the morning. BariMelts Multivitamin W/Iron (chewable)     multivitamin-lutein (OCUVITE-LUTEIN) CAPS capsule Take 1 capsule by mouth in the morning.     Semaglutide-Weight Management (WEGOVY) 1.7 MG/0.75ML SOAJ Inject 1.7 mg total into the skin once a week 3 mL 0   tiZANidine (ZANAFLEX) 4 MG tablet Take 1.5 tablets (6 mg total) by mouth every 8 (eight) hours as needed for muscle spasms. 60 tablet 6   eletriptan (RELPAX) 40 MG tablet TAKE 1 TABLET TWICE A DAY  AS NEEDED FOR MIGRAINE OR  HEADACHE 6 tablet 9   No facility-administered medications prior to visit.    PAST MEDICAL HISTORY: Past Medical History:  Diagnosis Date   Arthritis    bilateral knees, recent LCL sprain-on left knee   CHF (congestive heart failure) ([facility])    Dysrhythmia    trigem   Environmental allergies    Family history of adverse reaction to anesthesia    slow to wake up   Fatty liver    Headache(784.0)    migraines-on meds for control, relpax, ketoprophen, vicoden, muscle relaxer   Hearing loss 2016   High frequency hearing - Bilateral    History of hiatal hernia    Hot flashes    Joint pain    Lichen sclerosus    Lipoma    Right Knee   Menorrhagia    Migraines    Necrobiosis lipoidica    Osteoarthritis    Overweight    Pelvic pain in female    PONV (postoperative nausea and vomiting)    ponv, slow to awake   Seasonal allergies    Sinus problem    Sprain 10/25/2010   left knee   Trigeminal pulse    Trigger finger, right    Middle finger and right wrist    Vitamin D deficiency     PAST SURGICAL HISTORY: Past Surgical History:  Procedure Laterality Date   APPENDECTOMY     CHOLECYSTECTOMY     DIAGNOSTIC LAPAROSCOPY  08/2006    DILATION AND CURETTAGE OF UTERUS  2011   attempted ablation   FUNCTIONAL ENDOSCOPIC SINUS SURGERY     ganglion cyst removal     HYSTEROSCOPY     failed   KNEE ARTHROPLASTY Right 2018   meniscus   KNEE CLOSED REDUCTION Left 08/18/2020   Procedure: LEFT KNEE MANIPULATION UNDER ANESTHESIA;  Surgeon: Leandrew Koyanagi, MD;  Location: [place];  Service: Orthopedics;  Laterality: Left;   LAPAROSCOPIC GASTRIC SLEEVE RESECTION  01/2020   LASIK     LIPOMA EXCISION Right 07/22/2014   Procedure: EXCISION RIGHT THIGH LIPOMA;  Surgeon: Erroll Luna, MD;  Location: [place];  Service: General;  Laterality: Right;   ROBOTIC ASSISTED TOTAL HYSTERECTOMY  11/06/10   TLH/RSO   SALPINGOOPHORECTOMY  11/06/2010   Procedure: SALPINGO OOPHERECTOMY;  Surgeon: Felipa Emory;  Location: [facility];  Service: Gynecology;  Laterality: Right;   TOTAL KNEE ARTHROPLASTY Left 05/23/2020   Procedure: LEFT TOTAL KNEE ARTHROPLASTY;  Surgeon: Leandrew Koyanagi, MD;  Location: [place];  Service: Orthopedics;  Laterality: Left;   TRIGGER FINGER RELEASE     UPPER GI ENDOSCOPY N/A 02/02/2020   Procedure: UPPER GI ENDOSCOPY;  Surgeon: [name], MD;  Location: [facility];  Service: General;  Laterality: N/A;    FAMILY HISTORY: Family History  Problem Relation Age of Onset   Skin cancer Mother    Migraines Mother    High blood pressure Mother    Heart disease Father    High blood pressure Father    High Cholesterol Father    Breast cancer Maternal Aunt 73   Breast cancer Paternal Aunt 29   Migraines Brother     SOCIAL HISTORY: Social History   Socioeconomic History   Marital status: Married    Spouse name: Eddie Dibbles [place]   Number of children: 0   Years of education: 18   Highest education level: Not on file  Occupational History   Occupation: Administrator, [employer]  Tobacco Use   Smoking status: Never   Smokeless tobacco: Never  Vaping Use   Vaping Use: Never used  Substance and Sexual  Activity   Alcohol use: No    Alcohol/week: 0.0 standard drinks of alcohol   Drug use: No   Sexual activity: Yes    Birth control/protection: Surgical    Comment: TLH/RSO  Other Topics Concern   Not on file  Social History Narrative   Lives w/ husband, Eddie Dibbles   Caffeine use: Diet coke girl   Right handed    Social Determinants of Health   Financial Resource Strain: Not on file  Food Insecurity: No Food Insecurity (10/23/2019)   Hunger Vital Sign    Worried About Running Out of Food in the Last Year: Never true    Ran Out of Food in the Last Year: Never true  Transportation Needs: Not on file  Physical Activity: Not on file  Stress: Not on file  Social Connections: Not on file  Intimate Partner Violence: Not on file   PHYSICAL EXAM  Vitals:   09/07/21 0856  BP: 131/88  Pulse: 80  Weight: 224 lb (101.6 kg)  Height: '5\' 5"'$  (1.651 m)   Body mass index is 37.28 kg/m.  Generalized: Well developed, in no acute distress   Neurological examination  Mentation: Alert oriented to time, place, history taking. Follows all commands speech and language fluent Cranial nerve II-XII: Pupils were equal round reactive to light. Extraocular movements were full, visual field were full on confrontational test. Facial sensation and strength were normal. Head turning and shoulder shrug  were normal and symmetric. Motor: The motor testing reveals 5 over 5 strength of all 4 extremities. Good symmetric motor tone is noted throughout.  Sensory: Sensory testing is intact to soft touch on all 4 extremities. No evidence of extinction is noted.  Coordination: Cerebellar testing reveals good finger-nose-finger and heel-to-shin bilaterally.  Gait and station: Gait is normal  DIAGNOSTIC DATA (LABS, IMAGING, TESTING) - I reviewed patient records, labs, notes, testing and imaging myself where available.  Lab Results  Component Value Date   WBC 13.4 (H) 05/24/2020   HGB 12.7 05/24/2020   HCT 39.4  05/24/2020   MCV 91.8 05/24/2020   PLT 238 05/24/2020      Component Value Date/Time   NA 140 05/24/2020 0754   NA 139 07/21/2018 1035   K 3.8 05/24/2020 0754   CL 106 05/24/2020 0754   CO2 24 05/24/2020 0754   GLUCOSE 87 05/24/2020 0754   BUN 8 05/24/2020 0754   BUN 14 07/21/2018 1035   CREATININE 0.89 05/24/2020 0754   CREATININE 0.85 04/15/2015 1512   CALCIUM 9.5 05/24/2020 0754   PROT 6.7 05/17/2020 1536   PROT 6.6 07/21/2018 1035   ALBUMIN 3.8 05/17/2020 1536   ALBUMIN 4.1 07/21/2018 1035   AST 56 (H) 05/17/2020 1536   ALT 71 (H) 05/17/2020 1536   ALKPHOS 129 (H) 05/17/2020 1536   BILITOT 0.4 05/17/2020 1536   BILITOT 0.3 07/21/2018 1035   GFRNONAA >60 05/24/2020 0754   GFRAA >60 07/02/2019 1115   Lab Results  Component Value Date   CHOL 170 07/26/2021   HDL 52 07/26/2021   LDLCALC 101 (H) 07/26/2021   TRIG 92 07/26/2021   CHOLHDL 3.3 07/26/2021   Lab Results  Component Value Date   HGBA1C 5.6 07/21/2018   No results found for: "VITAMINB12" Lab Results  Component Value Date   TSH 3.940 07/26/2021    ASSESSMENT AND PLAN 61 y.o. year old female   1.  Chronic migraine headache  -Migraines under good control, few more headaches recently related to heat/stress -Continue Relpax as needed for acute headache treatment -Hold off on preventative for now, can consider CGRP if headaches increase -Previously tried and failed: beta blocker, Effexor, is currently on carvedilol -Follow-up in 1 year or sooner if needed, we discussed transition back to PCP  Butler Denmark, AGNP-C, DNP 09/07/2021, 9:35 AM [facility] [address] [place], [place] [address] [phone]

## 2021-09-07 ENCOUNTER — Ambulatory Visit: Payer: BC Managed Care – PPO | Admitting: Neurology

## 2021-09-07 ENCOUNTER — Encounter: Payer: Self-pay | Admitting: Neurology

## 2021-09-07 VITALS — BP 131/88 | HR 80 | Ht 65.0 in | Wt 224.0 lb

## 2021-09-07 DIAGNOSIS — G43909 Migraine, unspecified, not intractable, without status migrainosus: Secondary | ICD-10-CM | POA: Diagnosis not present

## 2021-09-07 MED ORDER — ELETRIPTAN HYDROBROMIDE 40 MG PO TABS: ORAL_TABLET | ORAL | 11 refills | Status: DC

## 2021-09-14 ENCOUNTER — Encounter (HOSPITAL_BASED_OUTPATIENT_CLINIC_OR_DEPARTMENT_OTHER): Payer: Self-pay | Admitting: Obstetrics & Gynecology

## 2021-09-20 ENCOUNTER — Encounter (INDEPENDENT_AMBULATORY_CARE_PROVIDER_SITE_OTHER): Payer: Self-pay

## 2021-10-09 ENCOUNTER — Other Ambulatory Visit: Payer: Self-pay | Admitting: Cardiovascular Disease

## 2021-10-11 ENCOUNTER — Ambulatory Visit
Admission: RE | Admit: 2021-10-11 | Discharge: 2021-10-11 | Disposition: A | Discharge status: Home / Self Care | Payer: BC Managed Care – PPO | Source: Ambulatory Visit | Attending: Obstetrics & Gynecology | Admitting: Obstetrics & Gynecology

## 2021-10-11 ENCOUNTER — Other Ambulatory Visit (HOSPITAL_BASED_OUTPATIENT_CLINIC_OR_DEPARTMENT_OTHER): Payer: Self-pay

## 2021-10-11 MED ORDER — WEGOVY 1.7 MG/0.75ML ~~LOC~~ SOAJ
SUBCUTANEOUS | 0 refills | Status: DC
  Filled 2021-10-11: qty 3, 28d supply, fill #0

## 2021-10-23 ENCOUNTER — Other Ambulatory Visit: Payer: Self-pay | Admitting: Cardiovascular Disease

## 2021-11-16 ENCOUNTER — Other Ambulatory Visit (HOSPITAL_BASED_OUTPATIENT_CLINIC_OR_DEPARTMENT_OTHER): Payer: Self-pay

## 2021-11-16 MED ORDER — WEGOVY 2.4 MG/0.75ML ~~LOC~~ SOAJ
SUBCUTANEOUS | 2 refills | Status: DC
  Filled 2021-11-16: qty 3, 28d supply, fill #0
  Filled 2021-12-09: qty 3, 28d supply, fill #1
  Filled 2022-01-04: qty 3, 28d supply, fill #2

## 2021-12-11 ENCOUNTER — Other Ambulatory Visit (HOSPITAL_BASED_OUTPATIENT_CLINIC_OR_DEPARTMENT_OTHER): Payer: Self-pay

## 2022-01-05 ENCOUNTER — Other Ambulatory Visit (HOSPITAL_BASED_OUTPATIENT_CLINIC_OR_DEPARTMENT_OTHER): Payer: Self-pay

## 2022-01-12 ENCOUNTER — Other Ambulatory Visit: Payer: Self-pay | Admitting: Cardiovascular Disease

## 2022-01-17 NOTE — Progress Notes (Signed)
Telehealth Visit        Date:  01/26/2022   ID:  JERNEY BAKSH, DOB [birth year], MRN [id number]  PCP:  Lawerance Cruel, MD  Cardiologist:  Jenkins Rouge, MD  Electrophysiologist:  None   Chief Complaint:  Follow up.   History of Present Illness:    Savannah [name] is a 61 y.o. female has a history of NICM, PVCs - asymptomatic, no CAD, migraines, obesity and prior shingles.   She has been on ACE and beta blocker. Losartan changed to lisinopril due to concerns for possible insomnia.   Had gastric sleeve and cholecystectomy done December 2021   Had left TKR 7/82/95 complicated by arthrofibrosis requiring manipulation under anesthesia Again 08/18/20 may need synovectomy   She works at [employer] in [employer]  Work stress makes migraines worse Seeing neurology  She is leaning toward not having any more surgery Knee more stable but just not flexible   Past Medical History:  Diagnosis Date   Arthritis    bilateral knees, recent LCL sprain-on left knee   CHF (congestive heart failure) ([place])    Dysrhythmia    trigem   Environmental allergies    Family history of adverse reaction to anesthesia    slow to wake up   Fatty liver    Headache(784.0)    migraines-on meds for control, relpax, ketoprophen, vicoden, muscle relaxer   Hearing loss 2016   High frequency hearing - Bilateral    History of hiatal hernia    Hot flashes    Joint pain    Lichen sclerosus    Lipoma    Right Knee   Menorrhagia    Migraines    Necrobiosis lipoidica    Osteoarthritis    Overweight    Pelvic pain in female    PONV (postoperative nausea and vomiting)    ponv, slow to awake   Seasonal allergies    Sinus problem    Sprain 10/25/2010   left knee   Trigeminal pulse    Trigger finger, right    Middle finger and right wrist    Vitamin D deficiency    Past Surgical History:  Procedure Laterality Date   APPENDECTOMY     CHOLECYSTECTOMY     DIAGNOSTIC LAPAROSCOPY  08/2006   DILATION AND  CURETTAGE OF UTERUS  2011   attempted ablation   FUNCTIONAL ENDOSCOPIC SINUS SURGERY     ganglion cyst removal     HYSTEROSCOPY     failed   KNEE ARTHROPLASTY Right 2018   meniscus   KNEE CLOSED REDUCTION Left 08/18/2020   Procedure: LEFT KNEE MANIPULATION UNDER ANESTHESIA;  Surgeon: Leandrew Koyanagi, MD;  Location: [facility];  Service: Orthopedics;  Laterality: Left;   LAPAROSCOPIC GASTRIC SLEEVE RESECTION  01/2020   LASIK     LIPOMA EXCISION Right 07/22/2014   Procedure: EXCISION RIGHT THIGH LIPOMA;  Surgeon: Erroll Luna, MD;  Location: [facility];  Service: General;  Laterality: Right;   ROBOTIC ASSISTED TOTAL HYSTERECTOMY  11/06/10   TLH/RSO   SALPINGOOPHORECTOMY  11/06/2010   Procedure: SALPINGO OOPHERECTOMY;  Surgeon: Felipa Emory;  Location: [facility];  Service: Gynecology;  Laterality: Right;   TOTAL KNEE ARTHROPLASTY Left 05/23/2020   Procedure: LEFT TOTAL KNEE ARTHROPLASTY;  Surgeon: Leandrew Koyanagi, MD;  Location: [facility];  Service: Orthopedics;  Laterality: Left;   TRIGGER FINGER RELEASE     UPPER GI ENDOSCOPY N/A 02/02/2020   Procedure: UPPER GI ENDOSCOPY;  Surgeon: [name], MD;  Location: [facility];  Service: General;  Laterality: N/A;     Current Meds  Medication Sig   amoxicillin (AMOXIL) 500 MG tablet Take four pills one hour prior to dental work   Ca Phosphate-Cholecalciferol (CALCIUM 500 + D3) 250-500 MG-UNIT CHEW Chew 1 tablet by mouth in the morning, at noon, and at bedtime. Bariatric Calcium Citrate chewy   carvedilol (COREG) 6.25 MG tablet TAKE 1 TABLET TWICE DAILY  WITH MEALS   clobetasol cream (TEMOVATE) 2.84 % Apply 1 application. topically as needed (lichen sclerosus). Use when having a flare.  Do not use for more than 5 days.   eletriptan (RELPAX) 40 MG tablet TAKE 1 TABLET TWICE A DAY  AS NEEDED FOR MIGRAINE OR  HEADACHE   furosemide (LASIX) 20 MG tablet Take 0.5 tablets (10 mg total) by mouth daily as needed for edema.    hyoscyamine (LEVSIN) 0.125 MG tablet Take 0.125 mg by mouth every 4 (four) hours as needed (abdominal cramps/spasms).   lisinopril (ZESTRIL) 10 MG tablet TAKE 1 TABLET DAILY.   mometasone (ELOCON) 0.1 % ointment Use small topically twice weekly for maintenance.   Multiple Vitamins-Minerals (BARIATRIC MULTIVITAMINS/IRON PO) Take 1 tablet by mouth in the morning. BariMelts Multivitamin W/Iron (chewable)   multivitamin-lutein (OCUVITE-LUTEIN) CAPS capsule Take 1 capsule by mouth in the morning.   Semaglutide-Weight Management (WEGOVY) 2.4 MG/0.75ML SOAJ Inject 0.75 mLs (2.4 mg total) subcutaneously once a week   tiZANidine (ZANAFLEX) 4 MG tablet Take 1.5 tablets (6 mg total) by mouth every 8 (eight) hours as needed for muscle spasms.     Allergies:   Dust mite extract and Nsaids   Social History   Tobacco Use   Smoking status: Never   Smokeless tobacco: Never  Vaping Use   Vaping Use: Never used  Substance Use Topics   Alcohol use: No    Alcohol/week: 0.0 standard drinks of alcohol   Drug use: No     Family Hx: The patient's family history includes Breast cancer (age of onset: 56) in her maternal aunt and paternal aunt; Heart disease in her father; High Cholesterol in her father; High blood pressure in her father and mother; Migraines in her brother and mother; Skin cancer in her mother.  ROS:   Please see the history of present illness.   All other systems reviewed are negative.    Objective:    Vital Signs:  BP 100/70   Pulse 83   Ht '5\' 5"'$  (1.651 m)   Wt 208 lb 12.8 oz (94.7 kg)   LMP 10/14/2010   SpO2 98%   BMI 34.75 kg/m    Wt Readings from Last 3 Encounters:  01/26/22 208 lb 12.8 oz (94.7 kg)  09/07/21 224 lb (101.6 kg)  07/26/21 228 lb 9.6 oz (103.7 kg)   Affect appropriate Healthy:  appears stated age 1: normal Neck supple with no adenopathy JVP normal no bruits no thyromegaly Lungs clear with no wheezing and good diaphragmatic motion Heart:  S1/S2 no  murmur, no rub, gallop or click PMI normal post lap GB surgery and gastric sleeve Abdomen: benighn, BS positve, no tenderness, no AAA no bruit.  No HSM or HJR Distal pulses intact with no bruits No edema Neuro non-focal Post left TKR    ECG: 01/26/2022  SR rate 83 normal   Labs/Other Tests and Data Reviewed:    Lab Results  Component Value Date   WBC 13.4 (H) 05/24/2020   HGB 12.7 05/24/2020  HCT 39.4 05/24/2020   PLT 238 05/24/2020   GLUCOSE 87 05/24/2020   CHOL 170 07/26/2021   TRIG 92 07/26/2021   HDL 52 07/26/2021   LDLCALC 101 (H) 07/26/2021   ALT 71 (H) 05/17/2020   AST 56 (H) 05/17/2020   NA 140 05/24/2020   K 3.8 05/24/2020   CL 106 05/24/2020   CREATININE 0.89 05/24/2020   BUN 8 05/24/2020   CO2 24 05/24/2020   TSH 3.940 07/26/2021   INR 1.0 05/17/2020   HGBA1C 5.6 07/21/2018       BNP (last 3 results) No results for input(s): "BNP" in the last 8760 hours.  ProBNP (last 3 results) No results for input(s): "PROBNP" in the last 8760 hours.    Prior CV studies:    The following studies were reviewed today:  ECHO IMPRESSIONS 09/2019   1. Left ventricular ejection fraction, by estimation, is 50%. The left  ventricle has mildly reduced systolic function. Mild global hypokinesis.  Left ventricular diastolic parameters are consistent with Grade I  diastolic dysfunction (impaired  relaxation). GLS -23.2%, normal.   2. Right ventricular systolic function is normal. The right ventricular  size is normal. Tricuspid regurgitation signal is inadequate for assessing  PA pressure.   3. The aortic valve is tricuspid. Aortic valve regurgitation is not  visualized. No aortic stenosis is present.   4. The mitral valve is normal in structure. No evidence of mitral valve  regurgitation. No evidence of mitral stenosis.   5. The inferior vena cava is normal in size with greater than 50%  respiratory variability, suggesting right atrial pressure of 3 mmHg.    Comparison(s): 09/10/17 EF 45-50%.    Myoview Study Highlights 09/2018  EF is 51 %. Visually, the EF appears to be greater than 51% This is a low risk study. There is no evidence of ischemia or infarction . The study is normal    ASSESSMENT & PLAN:    1. NICM - last echo from August 2021  noted - EF now 50% - she is doing well. NYHA I/II - no changes made today. Remains on beta blocker, ACE and prn Lasix.   2. Prior chest pain - normal Myoview in 2020 and calcium score of 0  resolved   3. Prior elevated LFTs - improving. F/U primary 05/17/20 AST 56 ALT 71   4. Obesity - now s/p gastric sleeve - actively losing weight.   5. Ortho:  post left TKR with revision PT/OT Has had dry needling May need synovectomy Discussed SBE prophylaxis She is leaning toward conservative Rx and not having any more surgery   6. Migraines:  f/u neurology exacerbated by work stress continue Relpax   Medication Adjustments/Labs and Tests Ordered: Current medicines are reviewed at length with the patient today.  Concerns regarding medicines are outlined above.   Tests Ordered: No orders of the defined types were placed in this encounter.  Update TTE next year   Medication Changes: No orders of the defined types were placed in this encounter.    Disposition:  FU with me in a year    Signed, Jenkins Rouge, MD  01/26/2022 8:40 AM    [place]

## 2022-01-26 ENCOUNTER — Other Ambulatory Visit (HOSPITAL_BASED_OUTPATIENT_CLINIC_OR_DEPARTMENT_OTHER): Payer: Self-pay

## 2022-01-26 ENCOUNTER — Ambulatory Visit: Payer: BC Managed Care – PPO | Attending: Cardiovascular Disease | Admitting: Cardiovascular Disease

## 2022-01-26 ENCOUNTER — Encounter: Payer: Self-pay | Admitting: Cardiovascular Disease

## 2022-01-26 VITALS — BP 100/70 | HR 83 | Ht 65.0 in | Wt 208.8 lb

## 2022-01-26 DIAGNOSIS — I428 Other cardiomyopathies: Secondary | ICD-10-CM | POA: Diagnosis not present

## 2022-01-26 DIAGNOSIS — Z01810 Encounter for preprocedural cardiovascular examination: Secondary | ICD-10-CM | POA: Diagnosis not present

## 2022-01-26 DIAGNOSIS — Z96652 Presence of left artificial knee joint: Secondary | ICD-10-CM | POA: Diagnosis not present

## 2022-01-26 MED ORDER — WEGOVY 2.4 MG/0.75ML ~~LOC~~ SOAJ
2.4000 mg | SUBCUTANEOUS | 2 refills | Status: DC
  Filled 2022-01-26: qty 3, 28d supply, fill #0
  Filled 2022-03-04: qty 3, 28d supply, fill #1
  Filled 2022-04-01: qty 3, 28d supply, fill #2

## 2022-01-26 NOTE — Patient Instructions (Signed)
Medication Instructions:  Your physician recommends that you continue on your current medications as directed. Please refer to the Current Medication list given to you today.  *If you need a refill on your cardiac medications before your next appointment, please call your pharmacy*  Lab Work: If you have labs (blood work) drawn today and your tests are completely normal, you will receive your results only by: [place] (if you have MyChart) OR A paper copy in the mail If you have any lab test that is abnormal or we need to change your treatment, we will call you to review the results.  Testing/Procedures: Your physician has requested that you have an echocardiogram. Echocardiography is a painless test that uses sound waves to create images of your heart. It provides your doctor with information about the size and shape of your heart and how well your heart's chambers and valves are working. This procedure takes approximately one hour. There are no restrictions for this procedure. Please do NOT wear cologne, perfume, aftershave, or lotions (deodorant is allowed). Please arrive 15 minutes prior to your appointment time.  Follow-Up: At [facility], you and your health needs are our priority.  As part of our continuing mission to provide you with exceptional heart care, we have created designated Provider Care Teams.  These Care Teams include your primary Cardiologist (physician) and Advanced Practice Providers (APPs -  Physician Assistants and Nurse Practitioners) who all work together to provide you with the care you need, when you need it.  We recommend signing up for the patient portal called "MyChart".  Sign up information is provided on this After Visit Summary.  MyChart is used to connect with patients for Virtual Visits (Telemedicine).  Patients are able to view lab/test results, encounter notes, upcoming appointments, etc.  Non-urgent messages can be sent to your provider as  well.   To learn more about what you can do with MyChart, go to [email or web address].    Your next appointment:   1 year(s)  The format for your next appointment:   In Person  Provider:   Jenkins Rouge, MD     Important Information About Sugar

## 2022-02-22 ENCOUNTER — Ambulatory Visit (HOSPITAL_COMMUNITY): Payer: BC Managed Care – PPO | Attending: Cardiology

## 2022-02-22 ENCOUNTER — Telehealth: Payer: Self-pay | Admitting: Cardiovascular Disease

## 2022-02-22 DIAGNOSIS — I428 Other cardiomyopathies: Secondary | ICD-10-CM | POA: Insufficient documentation

## 2022-02-22 LAB — ECHOCARDIOGRAM COMPLETE
Area-P 1/2: 2.93 cm2
S' Lateral: 3.05 cm

## 2022-02-22 NOTE — Telephone Encounter (Signed)
Patient returning call for echo results. 

## 2022-02-23 NOTE — Telephone Encounter (Signed)
The patient has been notified of the result and verbalized understanding.  All questions (if any) were answered. Michaelyn Barter, RN 02/23/2022 9:43 AM

## 2022-03-04 ENCOUNTER — Other Ambulatory Visit (HOSPITAL_BASED_OUTPATIENT_CLINIC_OR_DEPARTMENT_OTHER): Payer: Self-pay

## 2022-03-04 ENCOUNTER — Encounter (HOSPITAL_BASED_OUTPATIENT_CLINIC_OR_DEPARTMENT_OTHER): Payer: Self-pay | Admitting: Pharmacist

## 2022-03-05 ENCOUNTER — Other Ambulatory Visit (HOSPITAL_BASED_OUTPATIENT_CLINIC_OR_DEPARTMENT_OTHER): Payer: Self-pay

## 2022-03-06 ENCOUNTER — Other Ambulatory Visit (HOSPITAL_BASED_OUTPATIENT_CLINIC_OR_DEPARTMENT_OTHER): Payer: Self-pay

## 2022-03-09 ENCOUNTER — Other Ambulatory Visit (HOSPITAL_BASED_OUTPATIENT_CLINIC_OR_DEPARTMENT_OTHER): Payer: Self-pay

## 2022-04-12 ENCOUNTER — Other Ambulatory Visit: Payer: Self-pay | Admitting: Cardiovascular Disease

## 2022-04-29 ENCOUNTER — Other Ambulatory Visit (HOSPITAL_BASED_OUTPATIENT_CLINIC_OR_DEPARTMENT_OTHER): Payer: Self-pay

## 2022-04-30 ENCOUNTER — Other Ambulatory Visit: Payer: Self-pay | Admitting: Orthopaedic Surgery

## 2022-05-02 ENCOUNTER — Encounter: Payer: Self-pay | Admitting: Orthopaedic Surgery

## 2022-05-03 ENCOUNTER — Other Ambulatory Visit: Payer: Self-pay | Admitting: Physician Assistant

## 2022-05-03 MED ORDER — TIZANIDINE HCL 4 MG PO TABS: 4.0000 mg | ORAL_TABLET | Freq: Every day | ORAL | 1 refills | Status: DC | PRN

## 2022-05-03 NOTE — Telephone Encounter (Signed)
Please let her know that this was not denied but instructions have changed.  She is far enough out from surgery to not need muscle relaxer but I have refilled once more with a refill.  Will need to send to pain mgmt or get from pcp if she continues to need this

## 2022-05-07 ENCOUNTER — Other Ambulatory Visit (HOSPITAL_BASED_OUTPATIENT_CLINIC_OR_DEPARTMENT_OTHER): Payer: Self-pay

## 2022-05-09 ENCOUNTER — Other Ambulatory Visit (HOSPITAL_BASED_OUTPATIENT_CLINIC_OR_DEPARTMENT_OTHER): Payer: Self-pay

## 2022-05-09 MED ORDER — WEGOVY 2.4 MG/0.75ML ~~LOC~~ SOAJ
2.4000 mg | SUBCUTANEOUS | 2 refills | Status: DC
  Filled 2022-05-09: qty 3, 28d supply, fill #0

## 2022-06-28 ENCOUNTER — Encounter (HOSPITAL_BASED_OUTPATIENT_CLINIC_OR_DEPARTMENT_OTHER): Payer: Self-pay | Admitting: Obstetrics & Gynecology

## 2022-06-28 ENCOUNTER — Ambulatory Visit (INDEPENDENT_AMBULATORY_CARE_PROVIDER_SITE_OTHER): Payer: BC Managed Care – PPO | Admitting: Obstetrics & Gynecology

## 2022-06-28 VITALS — BP 103/64 | HR 80 | Ht 64.5 in | Wt 191.6 lb

## 2022-06-28 DIAGNOSIS — L900 Lichen sclerosus et atrophicus: Secondary | ICD-10-CM

## 2022-06-28 DIAGNOSIS — Z01419 Encounter for gynecological examination (general) (routine) without abnormal findings: Secondary | ICD-10-CM

## 2022-06-28 DIAGNOSIS — Z90710 Acquired absence of both cervix and uterus: Secondary | ICD-10-CM

## 2022-06-28 DIAGNOSIS — Z1389 Encounter for screening for other disorder: Secondary | ICD-10-CM

## 2022-06-28 DIAGNOSIS — M8580 Other specified disorders of bone density and structure, unspecified site: Secondary | ICD-10-CM

## 2022-06-28 MED ORDER — MOMETASONE FUROATE 0.1 % EX OINT: TOPICAL_OINTMENT | CUTANEOUS | 3 refills | Status: AC

## 2022-06-28 MED ORDER — CLOBETASOL PROPIONATE 0.05 % EX CREA: 1.0000 | TOPICAL_CREAM | CUTANEOUS | 1 refills | Status: AC | PRN

## 2022-06-28 NOTE — Progress Notes (Signed)
62 y.o. G0P0000 Married White or Caucasian female here for annual exam.  Doing well.  Has lost about about 95 pounds.  She has been on [employer].  Her surgeon is looking at [employer].    Denies vaginal bleeding.  Reports having vulvar itching.  Having a flare.  Has not been using the mometasone with regular use.  Right now using clobetasol.  She started using this again about a week ago.    Patient's last menstrual period was 10/14/2010.          Sexually active: Yes.    The current method of family planning is status post hysterectomy.    Exercising: not much Smoker:  no  Health Maintenance: Pap:  prior to hysterectomy History of abnormal Pap:  no MMG:  10/12/2021 Colonoscopy:  10/2019, 10 year follow up BMD:   2023, osteopenia Screening Labs: done 07/2021   reports that she has never smoked. She has never used smokeless tobacco. She reports that she does not drink alcohol and does not use drugs.  Past Medical History:  Diagnosis Date   Arthritis    bilateral knees, recent LCL sprain-on left knee   CHF (congestive heart failure) ([facility])    Dysrhythmia    trigem   Environmental allergies    Family history of adverse reaction to anesthesia    slow to wake up   Fatty liver    Headache(784.0)    migraines-on meds for control, relpax, ketoprophen, vicoden, muscle relaxer   Hearing loss 2016   High frequency hearing - Bilateral    History of hiatal hernia    Hot flashes    Joint pain    Lichen sclerosus    Lipoma    Right Knee   Menorrhagia    Migraines    Necrobiosis lipoidica    Osteoarthritis    Overweight    Pelvic pain in female    PONV (postoperative nausea and vomiting)    ponv, slow to awake   Seasonal allergies    Sinus problem    Sprain 10/25/2010   left knee   Trigeminal pulse    Trigger finger, right    Middle finger and right wrist    Vitamin D deficiency     Past Surgical History:  Procedure Laterality Date   APPENDECTOMY     CHOLECYSTECTOMY      DIAGNOSTIC LAPAROSCOPY  08/2006   DILATION AND CURETTAGE OF UTERUS  2011   attempted ablation   FUNCTIONAL ENDOSCOPIC SINUS SURGERY     ganglion cyst removal     HYSTEROSCOPY     failed   KNEE ARTHROPLASTY Right 2018   meniscus   KNEE CLOSED REDUCTION Left 08/18/2020   Procedure: LEFT KNEE MANIPULATION UNDER ANESTHESIA;  Surgeon: Tarry Kos, MD;  Location: [facility];  Service: Orthopedics;  Laterality: Left;   LAPAROSCOPIC GASTRIC SLEEVE RESECTION  01/2020   LASIK     LIPOMA EXCISION Right 07/22/2014   Procedure: EXCISION RIGHT THIGH LIPOMA;  Surgeon: Harriette Bouillon, MD;  Location: [facility];  Service: General;  Laterality: Right;   ROBOTIC ASSISTED TOTAL HYSTERECTOMY  11/06/10   TLH/RSO   SALPINGOOPHORECTOMY  11/06/2010   Procedure: SALPINGO OOPHERECTOMY;  Surgeon: Lum Keas;  Location: [facility];  Service: Gynecology;  Laterality: Right;   TOTAL KNEE ARTHROPLASTY Left 05/23/2020   Procedure: LEFT TOTAL KNEE ARTHROPLASTY;  Surgeon: Tarry Kos, MD;  Location: [facility];  Service: Orthopedics;  Laterality: Left;   TRIGGER FINGER  RELEASE     UPPER GI ENDOSCOPY N/A 02/02/2020   Procedure: UPPER GI ENDOSCOPY;  Surgeon: Gaynelle Adu, MD;  Location: [facility];  Service: General;  Laterality: N/A;    Current Outpatient Medications  Medication Sig Dispense Refill   amoxicillin (AMOXIL) 500 MG tablet Take four pills one hour prior to dental work 8 tablet 2   Ca Phosphate-Cholecalciferol (CALCIUM 500 + D3) 250-500 MG-UNIT CHEW Chew 1 tablet by mouth in the morning, at noon, and at bedtime. Bariatric Calcium Citrate chewy     carvedilol (COREG) 6.25 MG tablet TAKE 1 TABLET TWICE DAILY  WITH MEALS 180 tablet 3   clobetasol cream (TEMOVATE) 0.05 % Apply 1 application. topically as needed (lichen sclerosus). Use when having a flare.  Do not use for more than 5 days. 60 g 1   eletriptan (RELPAX) 40 MG tablet TAKE 1 TABLET TWICE A DAY  AS NEEDED FOR MIGRAINE OR  HEADACHE 6  tablet 11   furosemide (LASIX) 20 MG tablet Take 0.5 tablets (10 mg total) by mouth daily as needed for edema. 45 tablet 3   hyoscyamine (LEVSIN) 0.125 MG tablet Take 0.125 mg by mouth every 4 (four) hours as needed (abdominal cramps/spasms).     lisinopril (ZESTRIL) 10 MG tablet TAKE 1 TABLET DAILY. 90 tablet 3   mometasone (ELOCON) 0.1 % ointment Use small topically twice weekly for maintenance. 45 g 3   Multiple Vitamins-Minerals (BARIATRIC MULTIVITAMINS/IRON PO) Take 1 tablet by mouth in the morning. BariMelts Multivitamin W/Iron (chewable)     multivitamin-lutein (OCUVITE-LUTEIN) CAPS capsule Take 1 capsule by mouth in the morning.     tiZANidine (ZANAFLEX) 4 MG tablet Take 1 tablet (4 mg total) by mouth daily as needed for muscle spasms. 20 tablet 1   No current facility-administered medications for this visit.    Family History  Problem Relation Age of Onset   Skin cancer Mother    Migraines Mother    High blood pressure Mother    Heart disease Father    High blood pressure Father    High Cholesterol Father    Breast cancer Maternal Aunt 76   Breast cancer Paternal Aunt 38   Migraines Brother     ROS: Constitutional: negative Genitourinary:negative  Exam:   BP 103/64 (BP Location: Right Arm, Patient Position: Sitting, Cuff Size: Large)   Pulse 80   Ht 5' 4.5" (1.638 m) Comment: Reported  Wt 191 lb 9.6 oz (86.9 kg)   LMP 10/14/2010   BMI 32.38 kg/m   Height: 5' 4.5" (163.8 cm) (Reported)  General appearance: alert, cooperative and appears stated age Head: Normocephalic, without obvious abnormality, atraumatic Neck: no adenopathy, supple, symmetrical, trachea midline and thyroid normal to inspection and palpation Lungs: clear to auscultation bilaterally Breasts: normal appearance, no masses or tenderness Heart: regular rate and rhythm Abdomen: soft, non-tender; bowel sounds normal; no masses,  no organomegaly Extremities: extremities normal, atraumatic, no cyanosis  or edema Skin: Skin color, texture, turgor normal. No rashes or lesions Lymph nodes: Cervical, supraclavicular, and axillary nodes normal. No abnormal inguinal nodes palpated Neurologic: Grossly normal   Pelvic: External genitalia:  hypopigmentation of inner labia majora from clitoris down to perineum in kissing pattern              Urethra:  normal appearing urethra with no masses, tenderness or lesions              Bartholins and Skenes: normal  Vagina: normal appearing vagina with normal color and no discharge, no lesions              Cervix: absent              Pap taken: No. Bimanual Exam:  Uterus:  uterus absent              Adnexa: normal adnexa               Rectovaginal: Confirms               Anus:  normal sphincter tone, no lesions  Chaperone, Ina Homes, CMA, was present for exam.  Assessment/Plan: 1. Well woman exam with routine gynecological exam - Pap smear not indicated - Mammogram 10/12/2021 - Colonoscopy 10/2019, 10 year follow up - Bone mineral density 2023 - lab work done will be done next year.  Discussed with pt. - vaccines reviewed/updated  2. Lichen sclerosus - no excoriations or ulcerations or thick area on inner vulvar tissue - mometasone (ELOCON) 0.1 % ointment; Use small topically twice weekly for maintenance.  Dispense: 45 g; Refill: 3 - clobetasol cream (TEMOVATE) 0.05 %; Apply 1 Application topically as needed (lichen sclerosus). Use when having a flare.  Do not use for more than 5 days.  Dispense: 60 g; Refill: 1  3. H/O: hysterectomy  4.  Osteopenia

## 2022-07-17 ENCOUNTER — Encounter (HOSPITAL_BASED_OUTPATIENT_CLINIC_OR_DEPARTMENT_OTHER): Payer: Self-pay

## 2022-07-17 ENCOUNTER — Other Ambulatory Visit (HOSPITAL_BASED_OUTPATIENT_CLINIC_OR_DEPARTMENT_OTHER): Payer: Self-pay

## 2022-07-17 MED ORDER — CONTRAVE 8-90 MG PO TB12
ORAL_TABLET | ORAL | 0 refills | Status: AC
  Filled 2022-07-17: qty 70, 28d supply, fill #0

## 2022-07-18 ENCOUNTER — Other Ambulatory Visit (HOSPITAL_BASED_OUTPATIENT_CLINIC_OR_DEPARTMENT_OTHER): Payer: Self-pay

## 2022-07-18 ENCOUNTER — Encounter (HOSPITAL_BASED_OUTPATIENT_CLINIC_OR_DEPARTMENT_OTHER): Payer: Self-pay

## 2022-07-23 ENCOUNTER — Other Ambulatory Visit (HOSPITAL_BASED_OUTPATIENT_CLINIC_OR_DEPARTMENT_OTHER): Payer: Self-pay

## 2022-08-02 ENCOUNTER — Encounter (HOSPITAL_BASED_OUTPATIENT_CLINIC_OR_DEPARTMENT_OTHER): Payer: Self-pay | Admitting: Obstetrics & Gynecology

## 2022-08-02 ENCOUNTER — Encounter: Payer: Self-pay | Admitting: Neurology

## 2022-09-13 ENCOUNTER — Ambulatory Visit: Payer: BC Managed Care – PPO | Admitting: Neurology

## 2022-09-17 ENCOUNTER — Other Ambulatory Visit: Payer: Self-pay | Admitting: Family Medicine

## 2022-09-17 ENCOUNTER — Other Ambulatory Visit: Payer: Self-pay | Admitting: Obstetrics & Gynecology

## 2022-09-17 DIAGNOSIS — Z1231 Encounter for screening mammogram for malignant neoplasm of breast: Secondary | ICD-10-CM

## 2022-09-19 ENCOUNTER — Telehealth (INDEPENDENT_AMBULATORY_CARE_PROVIDER_SITE_OTHER): Payer: BC Managed Care – PPO | Admitting: Neurology

## 2022-09-19 DIAGNOSIS — G43909 Migraine, unspecified, not intractable, without status migrainosus: Secondary | ICD-10-CM

## 2022-09-19 MED ORDER — NORTRIPTYLINE HCL 10 MG PO CAPS: 10.0000 mg | ORAL_CAPSULE | Freq: Every day | ORAL | 5 refills | Status: DC

## 2022-09-19 MED ORDER — ELETRIPTAN HYDROBROMIDE 40 MG PO TABS: ORAL_TABLET | ORAL | 11 refills | Status: DC

## 2022-09-19 MED ORDER — TIZANIDINE HCL 2 MG PO TABS: 2.0000 mg | ORAL_TABLET | Freq: Three times a day (TID) | ORAL | 1 refills | Status: DC | PRN

## 2022-09-19 NOTE — Patient Instructions (Addendum)
Start nortriptyline 10 mg at bedtime for headache prevention.  If any side effects let me know.  I will send in tizanidine to take as needed for acute headache, tension.  Let me know if mediating.  Thanks!!

## 2022-09-19 NOTE — Progress Notes (Signed)
   Virtual Visit via Video Note  I connected with Savannah [name] on 09/19/22 at  4:00 PM EDT by a video enabled telemedicine application and verified that I am speaking with the correct person using two identifiers.  Location: Patient: at her work Provider: in the office    I discussed the limitations of evaluation and management by telemedicine and the availability of in person appointments. The patient expressed understanding and agreed to proceed.  History of Present Illness: Today September 19, 2022 SS: Migraines a little worse lately due to stress. More tension headaches, shoulders, neck, occipital area. The Relpax is working well, has been taking 2-3 times per week. Not sleeping well. Has some old tizanidine that helps with her tension,   09/08/22 SS: Savannah [name] is here today for follow-up. Is back to work full time. Last few weeks, more headaches. Thinks related to heat, work stress busy season. Takes Relpax with very good benefit. Not interested in another preventative. Has lost 12 lbs in 2 months with Wegovy. Has a cousin who has been diagnosed with hereditary hematochromatosis, she is having labs checked today. Another cousin diagnosed with malignant hyperthermia. Gets 6 Relpax tablets a month, doesn't use them all.    Observations/Objective: Via video visit, is alert and oriented, speech is clear and concise, moves about freely  Assessment and Plan: 1.  Chronic migraine headaches  -Few more tension type headaches that are triggering migraine headaches -Start nortriptyline 10 mg at bedtime for headache prevention -Continue Relpax as needed, may combine with tizanidine for significant headache -Next steps: Consider increasing nortriptyline, daily tizanidine for tension type headache, CGRP for migraine headache -Previously tried and failed: Effexor, Topamax  Follow Up Instructions: 6 months   I discussed the assessment and treatment plan with the patient. The patient was provided an  opportunity to ask questions and all were answered. The patient agreed with the plan and demonstrated an understanding of the instructions.   The patient was advised to call back or seek an in-person evaluation if the symptoms worsen or if the condition fails to improve as anticipated.  Otila Kluver, DNP  [facility] [address], [address] [place], [place] [address] [phone]

## 2022-10-16 ENCOUNTER — Ambulatory Visit
Admission: RE | Admit: 2022-10-16 | Discharge: 2022-10-16 | Disposition: A | Discharge status: Home / Self Care | Payer: BC Managed Care – PPO | Source: Ambulatory Visit

## 2022-10-16 DIAGNOSIS — Z1231 Encounter for screening mammogram for malignant neoplasm of breast: Secondary | ICD-10-CM

## 2022-10-23 ENCOUNTER — Ambulatory Visit (INDEPENDENT_AMBULATORY_CARE_PROVIDER_SITE_OTHER): Payer: BC Managed Care – PPO

## 2022-10-23 ENCOUNTER — Ambulatory Visit (INDEPENDENT_AMBULATORY_CARE_PROVIDER_SITE_OTHER): Payer: BC Managed Care – PPO | Admitting: Sports Medicine

## 2022-10-23 ENCOUNTER — Ambulatory Visit: Payer: BC Managed Care – PPO | Admitting: Orthopaedic Surgery

## 2022-10-23 ENCOUNTER — Other Ambulatory Visit: Payer: Self-pay

## 2022-10-23 DIAGNOSIS — Z96652 Presence of left artificial knee joint: Secondary | ICD-10-CM

## 2022-10-23 DIAGNOSIS — M25511 Pain in right shoulder: Secondary | ICD-10-CM

## 2022-10-23 DIAGNOSIS — M79674 Pain in right toe(s): Secondary | ICD-10-CM

## 2022-10-23 DIAGNOSIS — G8929 Other chronic pain: Secondary | ICD-10-CM

## 2022-10-23 MED ORDER — LIDOCAINE HCL 1 % IJ SOLN
2.0000 mL | INTRAMUSCULAR | Status: AC | PRN
Start: 2022-10-23 — End: 2022-10-23
  Administered 2022-10-23: 2 mL

## 2022-10-23 MED ORDER — METHYLPREDNISOLONE ACETATE 40 MG/ML IJ SUSP
80.0000 mg | INTRAMUSCULAR | Status: AC | PRN
Start: 2022-10-23 — End: 2022-10-23
  Administered 2022-10-23: 80 mg via INTRA_ARTICULAR

## 2022-10-23 MED ORDER — BUPIVACAINE HCL 0.25 % IJ SOLN
2.0000 mL | INTRAMUSCULAR | Status: AC | PRN
Start: 2022-10-23 — End: 2022-10-23
  Administered 2022-10-23: 2 mL via INTRA_ARTICULAR

## 2022-10-23 NOTE — Progress Notes (Signed)
Office Visit Note   Patient: Savannah [name]           Date of Birth: 1960-02-14           MRN: [id number] Visit Date: 10/23/2022              Requested by: Daisy Floro, MD [address] [place],  [place] [address] PCP: Daisy Floro, MD   Assessment & Plan: Visit Diagnoses:  1. Status post total left knee replacement   2. Chronic right shoulder pain   3. Great toe pain, right     Plan: Savannah [name] is a 62 year old female with right shoulder pain, left leg pain and right great toe pain.  In regards to the shoulder impression is biceps tendinitis.  Treatment options explained and she would like to try a glenohumeral injection.  In regards to the left leg I think it is related to muscular tightness that all along the lateral side of her leg.  I do not feel that the implant is related at all.  She should continue to work on stretching.  Could try dry needling or acupuncture.  In regards to the big toe impression is hallux rigidus.  Recommend carbon insert and Voltaren gel.  Follow-Up Instructions: No follow-ups on file.   Orders:  Orders Placed This Encounter  Procedures   XR Shoulder Right   XR Knee 1-2 Views Left   XR Toe Great Right   No orders of the defined types were placed in this encounter.     Procedures: No procedures performed   Clinical Data: No additional findings.   Subjective: Chief Complaint  Patient presents with   Left Knee - Pain    HPI Savannah [name] comes in today for evaluation of right shoulder pain, right great toe pain, left leg tightness and stiffness.  Reports tightness along the lateral aspect of the thigh and calf.  Does not feel any problems with the knee joint itself.  Feels worse after activity.  Gets stiffer as the day goes along.  In regards to the right shoulder she has pain along the anterior aspect of the proximal humerus.  Denies any injuries.  In regards to the big toe she has pain when she is pushing off and walking. Review of  Systems  Constitutional: Negative.   HENT: Negative.    Eyes: Negative.   Respiratory: Negative.    Cardiovascular: Negative.   Endocrine: Negative.   Musculoskeletal: Negative.   Neurological: Negative.   Hematological: Negative.   Psychiatric/Behavioral: Negative.    All other systems reviewed and are negative.    Objective: Vital Signs: LMP 10/14/2010   Physical Exam Vitals and nursing note reviewed.  Constitutional:      Appearance: She is well-developed.  HENT:     Head: Normocephalic and atraumatic.  Pulmonary:     Effort: Pulmonary effort is normal.  Abdominal:     Palpations: Abdomen is soft.  Musculoskeletal:     Cervical back: Neck supple.  Skin:    General: Skin is warm.     Capillary Refill: Capillary refill takes less than 2 seconds.  Neurological:     Mental Status: She is alert and oriented to person, place, and time.  Psychiatric:        Behavior: Behavior normal.        Thought Content: Thought content normal.        Judgment: Judgment normal.     Ortho Exam Exam of left knee shows a  fully healed surgical scar.  She has painless range of motion.  Collaterals are stable.  No joint effusion.  She feels tightness to the lateral aspect of the thigh and the calf.  Exam of the right shoulder shows tenderness along the biceps tendon.  Pain with Speed test.  Manual muscle testing the rotator cuff is normal.  No impingement signs.  Exam of right great toe shows pain with MTP grind test.  Range of motion is preserved. Specialty Comments:  No specialty comments available.  Imaging: XR Knee 1-2 Views Left  Result Date: 10/23/2022 X-rays of the left knee show a stable left total knee replacement in good alignment.   XR Shoulder Right  Result Date: 10/23/2022 X-rays of the right shoulder show mild irregularity of the greater tuberosity.  XR Toe Great Right  Result Date: 10/23/2022 X-rays of the right great toe show degenerative changes at the MTP joint  consistent with hallux rigidus.    PMFS History: Patient Active Problem List   Diagnosis Date Noted   Chronic right shoulder pain 10/23/2022   Great toe pain, right 10/23/2022   Osteopenia 06/28/2022   Arthritis of right knee 06/30/2021   Cardiomyopathy (HCC) 03/10/2021   Stiffness of left knee 08/18/2020   Status post total left knee replacement 05/23/2020   Primary osteoarthritis of left knee 05/05/2020   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   Fatty liver disease, nonalcoholic 04/02/2019   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   Trigger finger, right middle finger 10/10/2018   Other insomnia 06/23/2018   Vitamin D deficiency 03/26/2018   Prediabetes 03/11/2018   Obesity 02/26/2018   Migraines 10/01/2016   Decreased cardiac ejection fraction 10/01/2016   Severe obesity (HCC) 10/01/2016   Shingles 08/03/2016   Chronic pain of right knee 05/24/2016   Unilateral primary osteoarthritis, right knee 05/24/2016   Posterior tibial tendinitis, right leg 01/30/2016   Lichen sclerosus 12/23/2013   Past Medical History:  Diagnosis Date   Arthritis    bilateral knees, recent LCL sprain-on left knee   CHF (congestive heart failure) (HCC)    Dysrhythmia    trigem   Environmental allergies    Family history of adverse reaction to anesthesia    slow to wake up   Fatty liver    Headache(784.0)    migraines-on meds for control, relpax, ketoprophen, vicoden, muscle relaxer   Hearing loss 2016   High frequency hearing - Bilateral    History of hiatal hernia    Hot flashes    Joint pain    Lichen sclerosus    Lipoma    Right Knee   Menorrhagia    Migraines    Necrobiosis lipoidica    Osteoarthritis    Overweight    Pelvic pain in female    PONV (postoperative nausea and vomiting)    ponv, slow to awake   Seasonal allergies    Sinus problem    Sprain 10/25/2010   left knee   Trigeminal pulse    Trigger finger, right    Middle finger and right wrist     Vitamin D deficiency     Family History  Problem Relation Age of Onset   Skin cancer Mother    Migraines Mother    High blood pressure Mother    Heart disease Father    High blood pressure Father    High Cholesterol Father    Breast cancer Maternal Aunt 77   Breast cancer Paternal Aunt 22   Migraines  Brother     Past Surgical History:  Procedure Laterality Date   APPENDECTOMY     CHOLECYSTECTOMY     DIAGNOSTIC LAPAROSCOPY  08/2006   DILATION AND CURETTAGE OF UTERUS  2011   attempted ablation   FUNCTIONAL ENDOSCOPIC SINUS SURGERY     ganglion cyst removal     HYSTEROSCOPY     failed   KNEE ARTHROPLASTY Right 2018   meniscus   KNEE CLOSED REDUCTION Left 08/18/2020   Procedure: LEFT KNEE MANIPULATION UNDER ANESTHESIA;  Surgeon: Tarry Kos, MD;  Location: [facility];  Service: Orthopedics;  Laterality: Left;   LAPAROSCOPIC GASTRIC SLEEVE RESECTION  01/2020   LASIK     LIPOMA EXCISION Right 07/22/2014   Procedure: EXCISION RIGHT THIGH LIPOMA;  Surgeon: Harriette Bouillon, MD;  Location: [facility];  Service: General;  Laterality: Right;   ROBOTIC ASSISTED TOTAL HYSTERECTOMY  11/06/10   [facility]/RSO   SALPINGOOPHORECTOMY  11/06/2010   Procedure: SALPINGO OOPHERECTOMY;  Surgeon: Lum Keas;  Location: [facility];  Service: Gynecology;  Laterality: Right;   TOTAL KNEE ARTHROPLASTY Left 05/23/2020   Procedure: LEFT TOTAL KNEE ARTHROPLASTY;  Surgeon: Tarry Kos, MD;  Location: [facility];  Service: Orthopedics;  Laterality: Left;   TRIGGER FINGER RELEASE     UPPER GI ENDOSCOPY N/A 02/02/2020   Procedure: UPPER GI ENDOSCOPY;  Surgeon: Gaynelle Adu, MD;  Location: [facility];  Service: General;  Laterality: N/A;   Social History   Occupational History   Occupation: Regulatory affairs officer  Tobacco Use   Smoking status: Never   Smokeless tobacco: Never  Vaping Use   Vaping status: Never Used  Substance and Sexual Activity   Alcohol use: No    Alcohol/week: 0.0  standard drinks of alcohol   Drug use: No   Sexual activity: Yes    Birth control/protection: Surgical    Comment: [facility]/RSO

## 2022-10-23 NOTE — Progress Notes (Signed)
   Procedure Note  Patient: Savannah [name]             Date of Birth: 1960/03/29           MRN: [id number]             Visit Date: 10/23/2022  Procedures: Visit Diagnoses:  1. Chronic right shoulder pain    Large Joint Inj: R glenohumeral on 10/23/2022 4:31 PM Indications: pain Details: 22 G 3.5 in needle, ultrasound-guided posterior approach Medications: 2 mL lidocaine 1 %; 80 mg methylPREDNISolone acetate 40 MG/ML; 2 mL bupivacaine 0.25 % Outcome: tolerated well, no immediate complications  US-guided glenohumeral joint injection, right shoulder After discussion on risks/benefits/indications, informed verbal consent was obtained. A timeout was then performed. The patient was positioned lying lateral recumbent on examination table. The patient's shoulder was prepped with betadine and multiple alcohol swabs and utilizing ultrasound guidance, the patient's glenohumeral joint was identified on ultrasound. Using ultrasound guidance a 22-gauge, 3.5 inch needle with a mixture of 2:2:2 cc's lidocaine:bupivicaine:depomedrol was directed from a lateral to medial direction via in-plane technique into the glenohumeral joint with visualization of appropriate spread of injectate into the joint. Patient tolerated the procedure well without immediate complications.      Procedure, treatment alternatives, risks and benefits explained, specific risks discussed. Consent was given by the patient. Immediately prior to procedure a time out was called to verify the correct patient, procedure, equipment, support staff and site/side marked as required. Patient was prepped and draped in the usual sterile fashion.     - I evaluated the patient about 5 minutes post-injection and she had good movement of the shoulder without pain - follow-up with Dr. Roda Shutters as indicated; I am happy to see them as needed  Madelyn Brunner, DO Primary Care Sports Medicine Physician  [facility]  This note was  dictated using Dragon naturally speaking software and may contain errors in syntax, spelling, or content which have not been identified prior to signing this note.

## 2022-12-19 ENCOUNTER — Encounter: Payer: Self-pay | Admitting: Neurology

## 2023-03-04 DIAGNOSIS — Z0289 Encounter for other administrative examinations: Secondary | ICD-10-CM

## 2023-03-05 ENCOUNTER — Encounter: Payer: Self-pay | Admitting: Family Medicine

## 2023-03-05 ENCOUNTER — Ambulatory Visit: Payer: 59 | Admitting: Family Medicine

## 2023-03-05 VITALS — BP 114/80 | HR 58 | Temp 97.7°F | Ht 64.5 in | Wt 216.0 lb

## 2023-03-05 DIAGNOSIS — Z9884 Bariatric surgery status: Secondary | ICD-10-CM

## 2023-03-05 DIAGNOSIS — Z6836 Body mass index (BMI) 36.0-36.9, adult: Secondary | ICD-10-CM

## 2023-03-05 DIAGNOSIS — E66812 Obesity, class 2: Secondary | ICD-10-CM | POA: Diagnosis not present

## 2023-03-05 DIAGNOSIS — R7303 Prediabetes: Secondary | ICD-10-CM | POA: Diagnosis not present

## 2023-03-05 DIAGNOSIS — E6609 Other obesity due to excess calories: Secondary | ICD-10-CM

## 2023-03-05 DIAGNOSIS — M1712 Unilateral primary osteoarthritis, left knee: Secondary | ICD-10-CM | POA: Diagnosis not present

## 2023-03-05 NOTE — Progress Notes (Signed)
Office: [phone]  /  Fax: [phone]   Initial Visit  Savannah [name] was seen in clinic today to evaluate for obesity. She is interested in losing weight to improve overall health and reduce the risk of weight related complications. She presents today to review program treatment options, initial physical assessment, and evaluation.     She was referred by: Specialist  When asked what else they would like to accomplish? She states: Improve energy levels and physical activity, Improve quality of life, and Improve appearance  Weight history: pre op VSG Dec 2021 283 lb--> 220 lb and then down to 187 lb with Wegovy and has regained to 216 lb off of Wegovy.  Stopped due to lack of insurance coverage.    When asked how has your weight affected you? She states: Contributed to orthopedic problems or mobility issues and Having fatigue  Some associated conditions: MASLD and Prediabetes  Contributing factors: Moderate to high levels of stress, Reduced physical activity, and Menopause  Weight promoting medications identified: Other: Nortriptyline ?  Current nutrition plan: None  Current level of physical activity: Limited due to chronic pain or orthopedic problems Continue to have knee pain post L TKR, used to like ballroom dancing  Current or previous pharmacotherapy: GLP-1, Phentermine, and Topiramate  Response to medication: Lost weight initially but was unable to sustain weight loss and Was cost prohibitive or lost coverage for AOM.   Past medical history includes:   Past Medical History:  Diagnosis Date   Arthritis    bilateral knees, recent LCL sprain-on left knee   CHF (congestive heart failure) (HCC)    Dysrhythmia    trigem   Environmental allergies    Family history of adverse reaction to anesthesia    slow to wake up   Fatty liver    Headache(784.0)    migraines-on meds for control, relpax, ketoprophen, vicoden, muscle relaxer   Hearing loss 2016   High frequency  hearing - Bilateral    History of hiatal hernia    Hot flashes    Joint pain    Lichen sclerosus    Lipoma    Right Knee   Menorrhagia    Migraines    Necrobiosis lipoidica    Osteoarthritis    Overweight    Pelvic pain in female    PONV (postoperative nausea and vomiting)    ponv, slow to awake   Seasonal allergies    Sinus problem    Sprain 10/25/2010   left knee   Trigeminal pulse    Trigger finger, right    Middle finger and right wrist    Vitamin D deficiency      Objective:   BP 114/80   Pulse (!) 58   Temp 97.7 F (36.5 C)   Ht 5' 4.5" (1.638 m)   Wt 216 lb (98 kg)   LMP 10/14/2010   SpO2 93%   BMI 36.50 kg/m  She was weighed on the bioimpedance scale: Body mass index is 36.5 kg/m.  Peak Weight:280 , Body Fat%:48, Visceral Fat Rating:14, Weight trend over the last 12 months: Increasing  General:  Alert, oriented and cooperative. Patient is in no acute distress.  Respiratory: Normal respiratory effort, no problems with respiration noted   Gait: able to ambulate independently  Mental Status: Normal mood and affect. Normal behavior. Normal judgment and thought content.   DIAGNOSTIC DATA REVIEWED:  BMET    Component Value Date/Time   NA 140 05/24/2020 0754   NA 139  07/21/2018 1035   K 3.8 05/24/2020 0754   CL 106 05/24/2020 0754   CO2 24 05/24/2020 0754   GLUCOSE 87 05/24/2020 0754   BUN 8 05/24/2020 0754   BUN 14 07/21/2018 1035   CREATININE 0.89 05/24/2020 0754   CREATININE 0.85 04/15/2015 1512   CALCIUM 9.5 05/24/2020 0754   GFRNONAA >60 05/24/2020 0754   GFRAA >60 07/02/2019 1115   Lab Results  Component Value Date   HGBA1C 5.6 07/21/2018   HGBA1C 5.8 (H) 02/24/2018   Lab Results  Component Value Date   INSULIN 19.3 07/21/2018   INSULIN 14.1 02/24/2018   CBC    Component Value Date/Time   WBC 13.4 (H) 05/24/2020 0754   RBC 4.29 05/24/2020 0754   HGB 12.7 05/24/2020 0754   HGB 14.1 12/22/2013 1605   HCT 39.4 05/24/2020 0754   PLT  238 05/24/2020 0754   MCV 91.8 05/24/2020 0754   MCH 29.6 05/24/2020 0754   MCHC 32.2 05/24/2020 0754   RDW 14.7 05/24/2020 0754   Iron/TIBC/Ferritin/ %Sat No results found for: "IRON", "TIBC", "FERRITIN", "IRONPCTSAT" Lipid Panel     Component Value Date/Time   CHOL 170 07/26/2021 0813   TRIG 92 07/26/2021 0813   HDL 52 07/26/2021 0813   CHOLHDL 3.3 07/26/2021 0813   CHOLHDL 4.1 04/15/2015 1512   VLDL 36 (H) 04/15/2015 1512   LDLCALC 101 (H) 07/26/2021 0813   Hepatic Function Panel     Component Value Date/Time   PROT 6.7 05/17/2020 1536   PROT 6.6 07/21/2018 1035   ALBUMIN 3.8 05/17/2020 1536   ALBUMIN 4.1 07/21/2018 1035   AST 56 (H) 05/17/2020 1536   ALT 71 (H) 05/17/2020 1536   ALKPHOS 129 (H) 05/17/2020 1536   BILITOT 0.4 05/17/2020 1536   BILITOT 0.3 07/21/2018 1035      Component Value Date/Time   TSH 3.940 07/26/2021 0813     Assessment and Plan:   Primary osteoarthritis of left knee  Class 2 obesity due to excess calories with body mass index (BMI) of 36.0 to 36.9 in adult, unspecified whether serious comorbidity present  S/P laparoscopic sleeve gastrectomy  Prediabetes        Obesity Treatment / Action Plan:  Patient will work on garnering support from family and friends to begin weight loss journey. Will work on eliminating or reducing the presence of highly palatable, calorie dense foods in the home. Will complete provided nutritional and psychosocial assessment questionnaire before the next appointment. Will be scheduled for indirect calorimetry to determine resting energy expenditure in a fasting state.  This will allow [place] to create a reduced calorie, high-protein meal plan to promote loss of fat mass while preserving muscle mass. Will think about ideas on how to incorporate physical activity into their daily routine. Counseled on the health benefits of losing 5%-15% of total body weight. Was counseled on nutritional approaches to weight loss  and benefits of reducing processed foods and consuming plant-based foods and high quality protein as part of nutritional weight management. Was counseled on pharmacotherapy and role as an adjunct in weight management.   Obesity Education Performed Today:  She was weighed on the bioimpedance scale and results were discussed and documented in the synopsis.  We discussed obesity as a disease and the importance of a more detailed evaluation of all the factors contributing to the disease.  We discussed the importance of long term lifestyle changes which include nutrition, exercise and behavioral modifications as well as the importance of  customizing this to her specific health and social needs.  We discussed the benefits of reaching a healthier weight to alleviate the symptoms of existing conditions and reduce the risks of the biomechanical, metabolic and psychological effects of obesity.  Joud Bustillo Sottile appears to be in the action stage of change and states they are ready to start intensive lifestyle modifications and behavioral modifications.  25 minutes was spent today on this visit including the above counseling, pre-visit chart review, and post-visit documentation.  Reviewed by clinician on day of visit: allergies, medications, problem list, medical history, surgical history, family history, social history, and previous encounter notes pertinent to obesity diagnosis.    Seymour Bars, D.O. DABFM, [facility] [address] [place], [place] [address] [phone]

## 2023-03-12 ENCOUNTER — Encounter: Payer: Self-pay | Admitting: Family Medicine

## 2023-03-12 ENCOUNTER — Ambulatory Visit: Payer: 59 | Admitting: Family Medicine

## 2023-03-12 VITALS — BP 108/72 | HR 82 | Temp 97.8°F | Ht 64.5 in | Wt 217.0 lb

## 2023-03-12 DIAGNOSIS — Z1331 Encounter for screening for depression: Secondary | ICD-10-CM

## 2023-03-12 DIAGNOSIS — K760 Fatty (change of) liver, not elsewhere classified: Secondary | ICD-10-CM

## 2023-03-12 DIAGNOSIS — K911 Postgastric surgery syndromes: Secondary | ICD-10-CM

## 2023-03-12 DIAGNOSIS — R5383 Other fatigue: Secondary | ICD-10-CM | POA: Diagnosis not present

## 2023-03-12 DIAGNOSIS — E559 Vitamin D deficiency, unspecified: Secondary | ICD-10-CM

## 2023-03-12 DIAGNOSIS — R7303 Prediabetes: Secondary | ICD-10-CM

## 2023-03-12 DIAGNOSIS — R0602 Shortness of breath: Secondary | ICD-10-CM

## 2023-03-12 DIAGNOSIS — Z9884 Bariatric surgery status: Secondary | ICD-10-CM

## 2023-03-12 DIAGNOSIS — E669 Obesity, unspecified: Secondary | ICD-10-CM

## 2023-03-12 DIAGNOSIS — Z6836 Body mass index (BMI) 36.0-36.9, adult: Secondary | ICD-10-CM

## 2023-03-12 DIAGNOSIS — I428 Other cardiomyopathies: Secondary | ICD-10-CM

## 2023-03-12 DIAGNOSIS — M1712 Unilateral primary osteoarthritis, left knee: Secondary | ICD-10-CM

## 2023-03-12 NOTE — Patient Instructions (Addendum)
BEGIN TRACKING DAILY CALORIE INTAKE ON ONE OF THESE APS: MYFITNESSPAL LOSE IT MY NET DIARY  AIM FOR 1400 CAL / DAY THIS SHOULD INCLUDE ~90 G OF PROTEIN PER DAY  FOR MEAL PLAN IDEAS, YOU CAN USE THE CHAT GPT AP: - TYPE INTO SEARCH BOX 1400 CALORIE HIGH PROTEIN/ LOW CARB DIET  BE SURE TO GET IN LEAN PROTEIN WITH MEALS AND SNACKS: - THE ONLY BEAN EDAMAME - GREEK YOGURT WITH < 8 G OF SUGAR - SEEQ PROTEIN POWDER TO MIX WITH WATER - STRING CHEESE - MEAT STICKS (LIKE CHOMPS) - PRE- PACKED NUTS  ALLOW 2 SERVINGS OF FRESH OR FROZEN FRUIT DAILY  TRY TO GET IN 10 MIN OF WALKING MOST DAYS OF THE WEEK

## 2023-03-12 NOTE — Assessment & Plan Note (Signed)
Patient does note a history of prediabetes.  She is due for an A1c and fasting insulin level today.  She is very inactive at work, over consuming starches and sweets.  She is at risk for type 2 diabetes.  She has used metformin in the past without adverse side effects  Begin work on a reduced calorie low carb higher protein diet.  Read labels on food and drink for added sugar, avoiding products with over 8 g of added sugar per serving.  Slowly increase walking time.  Update labs today.

## 2023-03-12 NOTE — Assessment & Plan Note (Signed)
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 We discussed weight loss as a treatment of choice for MASLD.  Limit intake of alcohol and begin active plan for weight reduction.  Consider use of a GLP-1 receptor agonist for metabolic disease.

## 2023-03-12 NOTE — Assessment & Plan Note (Signed)
Left knee pain does continue status post left knee arthroplasty April 2022.  This has limited her walking time.  She agrees to starting with 10 minutes of walking indoor on a flat surface wearing proper sneakers 10 minutes a day which can be broken up into shorter time interval.  Consider referral to PT for use of an exercise bike to help build up muscles around her knee.

## 2023-03-12 NOTE — Assessment & Plan Note (Signed)
Managed by Dr Lloyd Huger, on carvedilol 6.25 mg twice daily, lisinopril 10 mg once daily, Lasix one half tab as needed leg edema. Echocardiogram reviewed from 02/22/2022 showing an LVEF of 60 to 55%  She denies dyspnea on exertion or chest pain.  Continue active plan for weight reduction.  Continue plan of care per cardiology.

## 2023-03-12 NOTE — Progress Notes (Signed)
At a Glance:  Vitals Temp: 97.8 F (36.6 C) BP: 108/72 Pulse Rate: 82 SpO2: 96 %   Anthropometric Measurements Height: 5' 4.5" (1.638 m) Weight: 217 lb (98.4 kg) BMI (Calculated): 36.69 Starting Weight: 217lb Peak Weight: 280lb   Body Composition  Body Fat %: 48.4 % Fat Mass (lbs): 105.2 lbs Muscle Mass (lbs): 106.6 lbs Total Body Water (lbs): 83.6 lbs Visceral Fat Rating : 15   Other Clinical Data RMR: 1613 Fasting: Yes Labs: Yes Today's Visit #: 1 Starting Date: 03/12/23    EKG: Normal sinus rhythm, rate 78.  Indirect Calorimeter completed today shows a VO2 of 234 and a REE of 1613.  Her calculated basal metabolic rate is 1610 thus her basal metabolic rate is better than expected.  Chief Complaint:  Obesity   Subjective:  Savannah [name] (MR# [id number]) is a 63 y.o. female who presents for evaluation and treatment of obesity and related comorbidities.   Savannah [name] is currently in the action stage of change and ready to dedicate time achieving and maintaining a healthier weight. Savannah [name] is interested in becoming our patient and working on intensive lifestyle modifications including (but not limited to) diet and exercise for weight loss.  Savannah [name] has been struggling with her weight. She has been unsuccessful in either losing weight, maintaining weight loss, or reaching her healthy weight goal.  Savannah [name]'s habits were reviewed today and are as follows: her desired weight loss is 60 lb, she started gaining weight with start of sedentary job after college. she has significant food cravings issues, and she frequently makes poor food choices.  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  She is married and her husband is supportive, cooks dinner.  She eats out for lunch daily.  Time is a limiting factor with meal planning and exercise.  Chronic L knee pain following arthroplasty April 2022 has limited her ability to add in much exercise.    She has gained more weight  since menopause.  She had VSG with [facility] Dec 2021.  She was 283 lb pre op and lost to 220 lb then started [facility] and lost to 187 lb.  She has regained weight off of Wegovy, off due to lack of insurance coverage.  High stress levels have contributed to poor eating habits.  Sleep is lacking.   Other Fatigue Savannah [name] admits to daytime somnolence and admits to waking up still tired. Patient has a history of symptoms of daytime fatigue. Savannah [name] generally gets 4 or 5 hours of sleep per night, and states that she has difficulty falling back asleep if awakened. Snoring is not present. Apneic episodes are not present. Epworth Sleepiness Score is 5.   Shortness of Breath Savannah [name] notes increasing shortness of breath with exercising and seems to be worsening over time with weight gain. She notes getting out of breath sooner with activity than she used to. This has gotten worse recently. Savannah [name] denies shortness of breath at rest or orthopnea.   Depression Screen Savannah [name]'s Food and Mood (modified PHQ-9) score was 10.     06/28/2022    2:10 PM  Depression screen PHQ 2/9  Decreased Interest 0  Down, Depressed, Hopeless 0  PHQ - 2 Score 0     Assessment and Plan:   Other Fatigue Savannah [name] does feel that her weight is causing her energy to be lower than it should be. Fatigue may be related to obesity, depression or many other causes. Labs will be ordered, and in the meanwhile,  Savannah [name] will focus on self care including making healthy food choices, increasing physical activity and focusing on stress reduction.  Shortness of Breath Savannah [name] does feel that she gets out of breath more easily that she used to when she exercises. Savannah [name]'s shortness of breath appears to be obesity related and exercise induced. She has agreed to work on weight loss and gradually increase exercise to treat her exercise induced shortness of breath. Will continue to monitor closely.  Savannah [name] had a positive depression screening.  Depression is commonly associated with obesity and often results in emotional eating behaviors. We will monitor this closely and work on CBT to help improve the non-hunger eating patterns. Referral to Psychology may be required if no improvement is seen as she continues in our clinic.    Problem List Items Addressed This Visit     Prediabetes   Patient does note a history of prediabetes.  She is due for an A1c and fasting insulin level today.  She is very inactive at work, over consuming starches and sweets.  She is at risk for type 2 diabetes.  She has used metformin in the past without adverse side effects  Begin work on a reduced calorie low carb higher protein diet.  Read labels on food and drink for added sugar, avoiding products with over 8 g of added sugar per serving.  Slowly increase walking time.  Update labs today.      Vitamin D deficiency   Relevant Orders   VITAMIN D 25 Hydroxy (Vit-D Deficiency, Fractures)   Metabolic dysfunction-associated steatotic liver disease (MASLD)   Patient has a history of metabolic dysfunction associated  steatotic liver disease with elevated liver enzymes. We discussed weight loss as a treatment of choice for MASLD.  Limit intake of alcohol and begin active plan for weight reduction.  Consider use of a GLP-1 receptor agonist for metabolic disease.      S/P laparoscopic sleeve gastrectomy   Primary osteoarthritis of left knee   Left knee pain does continue status post left knee arthroplasty April 2022.  This has limited her walking time.  She agrees to starting with 10 minutes of walking indoor on a flat surface wearing proper sneakers 10 minutes a day which can be broken up into shorter time interval.  Consider referral to PT for use of an exercise bike to help build up muscles around her knee.      Cardiomyopathy (HCC)   Managed by Dr Lloyd Huger, on carvedilol 6.25 mg twice daily, lisinopril 10 mg once daily, Lasix one half tab as needed leg  edema. Echocardiogram reviewed from 02/22/2022 showing an LVEF of 60 to 55%  She denies dyspnea on exertion or chest pain.  Continue active plan for weight reduction.  Continue plan of care per cardiology.      Other Visit Diagnoses       SOBOE (shortness of breath on exertion)    -  Primary     Other fatigue       Relevant Orders   EKG 12-Lead   Comprehensive metabolic panel   CBC   TSH Rfx on Abnormal to Free T4     Postgastric surgery syndrome       Relevant Orders   Vitamin B12   Folate   Prealbumin   Ferritin   Iron and TIBC   Vitamin B1     NICM (nonischemic cardiomyopathy) (HCC)       Relevant Orders   Lipid panel   Hemoglobin A1c  Insulin, random       Savannah [name] is currently in the action stage of change and her goal is to continue with weight loss efforts. I recommend Savannah [name] begin the structured treatment plan as follows:  She has agreed to dietary logging as reviewed on AVS with 1400 cal/ day and 90 g of protein intake daily  Exercise goals: All adults should avoid inactivity. Some activity is better than none, and adults who participate in any amount of physical activity, gain some health benefits. Begin 10 min of walking indoors daily, can break up  Behavioral modification strategies:increasing lean protein intake, decreasing simple carbohydrates, increasing vegetables, increase H2O intake, decrease liquid calories, decreasing eating out, no skipping meals, meal planning and cooking strategies, keeping healthy foods in the home, avoiding temptations, planning for success, and decrease junk food  She is a picky eater (hates protein shakes, protein bars, seafood)  Reviewed options for high protein snacks on AVS to try out  She was informed of the importance of frequent follow-up visits to maximize her success with intensive lifestyle modifications for her multiple health conditions. She was informed we would discuss her lab results at her next visit unless there  is a critical issue that needs to be addressed sooner. Savannah [name] agreed to keep her next visit at the agreed upon time to discuss these results.  Objective:  General: Cooperative, alert, well developed, in no acute distress. HEENT: Conjunctivae and lids unremarkable. Cardiovascular: Regular rhythm.  Lungs: Normal work of breathing. Neurologic: No focal deficits.   Lab Results  Component Value Date   CREATININE 0.89 05/24/2020   BUN 8 05/24/2020   NA 140 05/24/2020   K 3.8 05/24/2020   CL 106 05/24/2020   CO2 24 05/24/2020   Lab Results  Component Value Date   ALT 71 (H) 05/17/2020   AST 56 (H) 05/17/2020   ALKPHOS 129 (H) 05/17/2020   BILITOT 0.4 05/17/2020   Lab Results  Component Value Date   HGBA1C 5.6 07/21/2018   HGBA1C 5.8 (H) 02/24/2018   Lab Results  Component Value Date   INSULIN 19.3 07/21/2018   INSULIN 14.1 02/24/2018   Lab Results  Component Value Date   TSH 3.940 07/26/2021   Lab Results  Component Value Date   CHOL 170 07/26/2021   HDL 52 07/26/2021   LDLCALC 101 (H) 07/26/2021   TRIG 92 07/26/2021   CHOLHDL 3.3 07/26/2021   Lab Results  Component Value Date   WBC 13.4 (H) 05/24/2020   HGB 12.7 05/24/2020   HCT 39.4 05/24/2020   MCV 91.8 05/24/2020   PLT 238 05/24/2020   No results found for: "IRON", "TIBC", "FERRITIN"  Attestation Statements:  Reviewed by clinician on day of visit: allergies, medications, problem list, medical history, surgical history, family history, social history, and previous encounter notes.  Time spent on visit including pre-visit chart review and post-visit charting and care was 50 minutes.   Glennis Brink, DO

## 2023-03-17 LAB — COMPREHENSIVE METABOLIC PANEL
ALT: 17 [IU]/L (ref 0–32)
AST: 20 [IU]/L (ref 0–40)
Albumin: 4.1 g/dL (ref 3.9–4.9)
Alkaline Phosphatase: 162 [IU]/L — ABNORMAL HIGH (ref 44–121)
BUN/Creatinine Ratio: 15 (ref 12–28)
BUN: 15 mg/dL (ref 8–27)
Bilirubin Total: 0.5 mg/dL (ref 0.0–1.2)
CO2: 23 mmol/L (ref 20–29)
Calcium: 9.7 mg/dL (ref 8.7–10.3)
Chloride: 103 mmol/L (ref 96–106)
Creatinine, Ser: 0.97 mg/dL (ref 0.57–1.00)
Globulin, Total: 2.7 g/dL (ref 1.5–4.5)
Glucose: 86 mg/dL (ref 70–99)
Potassium: 4.4 mmol/L (ref 3.5–5.2)
Sodium: 140 mmol/L (ref 134–144)
Total Protein: 6.8 g/dL (ref 6.0–8.5)
eGFR: 66 mL/min/{1.73_m2} (ref >59)

## 2023-03-17 LAB — FOLATE: Folate: >20 ng/mL (ref >3.0)

## 2023-03-17 LAB — CBC
Hematocrit: 41.2 % (ref 34.0–46.6)
Hemoglobin: 13.8 g/dL (ref 11.1–15.9)
MCH: 31.2 pg (ref 26.6–33.0)
MCHC: 33.5 g/dL (ref 31.5–35.7)
MCV: 93 fL (ref 79–97)
Platelets: 315 10*3/uL (ref 150–450)
RBC: 4.42 x10E6/uL (ref 3.77–5.28)
RDW: 12.3 % (ref 11.7–15.4)
WBC: 7.4 10*3/uL (ref 3.4–10.8)

## 2023-03-17 LAB — LIPID PANEL
Chol/HDL Ratio: 3.2 {ratio} (ref 0.0–4.4)
Cholesterol, Total: 203 mg/dL — ABNORMAL HIGH (ref 100–199)
HDL: 63 mg/dL (ref >39)
LDL Chol Calc (NIH): 124 mg/dL — ABNORMAL HIGH (ref 0–99)
Triglycerides: 91 mg/dL (ref 0–149)
VLDL Cholesterol Cal: 16 mg/dL (ref 5–40)

## 2023-03-17 LAB — IRON AND TIBC
Iron Saturation: 32 % (ref 15–55)
Iron: 82 ug/dL (ref 27–139)
Total Iron Binding Capacity: 256 ug/dL (ref 250–450)
UIBC: 174 ug/dL (ref 118–369)

## 2023-03-17 LAB — VITAMIN B1: Thiamine: 166 nmol/L (ref 66.5–200.0)

## 2023-03-17 LAB — VITAMIN B12: Vitamin B-12: 733 pg/mL (ref 232–1245)

## 2023-03-17 LAB — VITAMIN D 25 HYDROXY (VIT D DEFICIENCY, FRACTURES): Vit D, 25-Hydroxy: 38.3 ng/mL (ref 30.0–100.0)

## 2023-03-17 LAB — TSH RFX ON ABNORMAL TO FREE T4: TSH: 3.22 u[IU]/mL (ref 0.450–4.500)

## 2023-03-17 LAB — FERRITIN: Ferritin: 198 ng/mL — ABNORMAL HIGH (ref 15–150)

## 2023-03-17 LAB — INSULIN, RANDOM: INSULIN: 11.4 u[IU]/mL (ref 2.6–24.9)

## 2023-03-17 LAB — HEMOGLOBIN A1C
Est. average glucose Bld gHb Est-mCnc: 114 mg/dL
Hgb A1c MFr Bld: 5.6 % (ref 4.8–5.6)

## 2023-03-17 LAB — PREALBUMIN: PREALBUMIN: 20 mg/dL (ref 10–36)

## 2023-03-28 ENCOUNTER — Encounter: Payer: Self-pay | Admitting: Family Medicine

## 2023-03-28 ENCOUNTER — Ambulatory Visit: Payer: 59 | Admitting: Family Medicine

## 2023-03-28 VITALS — BP 116/80 | HR 90 | Temp 98.0°F | Ht 64.5 in | Wt 217.0 lb

## 2023-03-28 DIAGNOSIS — K760 Fatty (change of) liver, not elsewhere classified: Secondary | ICD-10-CM

## 2023-03-28 DIAGNOSIS — E66812 Obesity, class 2: Secondary | ICD-10-CM

## 2023-03-28 DIAGNOSIS — M1712 Unilateral primary osteoarthritis, left knee: Secondary | ICD-10-CM

## 2023-03-28 DIAGNOSIS — Z9884 Bariatric surgery status: Secondary | ICD-10-CM

## 2023-03-28 DIAGNOSIS — Z6836 Body mass index (BMI) 36.0-36.9, adult: Secondary | ICD-10-CM

## 2023-03-28 DIAGNOSIS — E7800 Pure hypercholesterolemia, unspecified: Secondary | ICD-10-CM | POA: Diagnosis not present

## 2023-03-28 NOTE — Patient Instructions (Signed)
Continue to work on journaling Write down if you are satisfied or still hungry Stay off the high sugar foods and drinks Hydrate well with water outside of mealtime Track daily steps with a goal of > 5,000 steps Protein goal is 90 g / day  You can add in some beans, sweet potatoes, Quinoa, protein water [place], nut butter, pumpkin seeds

## 2023-03-28 NOTE — Assessment & Plan Note (Signed)
The 10-year ASCVD risk score (Arnett DK, et al., 2019) is: 3.5%   Values used to calculate the score:     Age: 63 years     Sex: Female     Is Non-Hispanic African American: No     Diabetic: No     Tobacco smoker: No     Systolic Blood Pressure: 116 mmHg     Is BP treated: No     HDL Cholesterol: 63 mg/dL     Total Cholesterol: 203 mg/dL

## 2023-03-28 NOTE — Progress Notes (Signed)
Office: [phone]  /  Fax: [phone]  WEIGHT SUMMARY AND BIOMETRICS  Starting Date: 03/12/23  Starting Weight: 217lb   Weight Lost Since Last Visit: 0lb   Vitals Temp: 98 F (36.7 C) BP: 116/80 Pulse Rate: 90 SpO2: 95 %   Body Composition  Body Fat %: 48.1 % Fat Mass (lbs): 104.4 lbs Muscle Mass (lbs): 106.8 lbs Total Body Water (lbs): 85.6 lbs Visceral Fat Rating : 14    HPI  Chief Complaint: OBESITY  Savannah [name] is here to discuss her progress with her obesity treatment plan. She is on the keeping a food journal and adhering to recommended goals of 1400 calories and 90 protein and states she is following her eating plan approximately 20 % of the time. She states she is exercising 30 minutes 5 times per week.   Interval History:  Since last office visit she is down 0 lb She is up 0.2 lb of muscle mass and down 0.8 lb of body fat in 2 weeks She has been writing down her food intake  She started to track her steps, getting 4,000 steps most days She realized she is getting too may carbs and not as much protein L knee DJD limits walking to 0.5 miles at a time She has cut back on sugar intake She has good support and plans to do a walking challenge for work She has some sugar cravings and hunger  Pharmacotherapy: none  PHYSICAL EXAM:  Blood pressure 116/80, pulse 90, temperature 98 F (36.7 C), height 5' 4.5" (1.638 m), weight 217 lb (98.4 kg), last menstrual period 10/14/2010, SpO2 95%. Body mass index is 36.67 kg/m.  General: She is overweight, cooperative, alert, well developed, and in no acute distress. PSYCH: Has normal mood, affect and thought process.   Lungs: Normal breathing effort, no conversational dyspnea.  ASSESSMENT AND PLAN  TREATMENT PLAN FOR OBESITY:  Recommended Dietary Goals  Savannah [name] is currently in the action stage of change. As such, her goal is to continue weight management plan. She has agreed to keeping a food journal and  adhering to recommended goals of 1200-1400 calories and 90 g of protein.  Behavioral Intervention  We discussed the following Behavioral Modification Strategies today: increasing lean protein intake to established goals, increasing vegetables, increasing lower glycemic fruits, increasing fiber rich foods, increasing water intake , work on meal planning and preparation, work on Counselling psychologist calories using tracking application, keeping healthy foods at home, avoiding temptations and identifying enticing environmental cues, continue to practice mindfulness when eating, and continue to work on maintaining a reduced calorie state, getting the recommended amount of protein, incorporating whole foods, making healthy choices, staying well hydrated and practicing mindfulness when eating..  Additional resources provided today: NA  Recommended Physical Activity Goals  Savannah [name] has been advised to work up to 150 minutes of moderate intensity aerobic activity a week and strengthening exercises 2-3 times per week for cardiovascular health, weight loss maintenance and preservation of muscle mass.   She has agreed to Start aerobic activity with a goal of 150 minutes a week at moderate intensity.  Increase daily step goal to 5000 or more daily  Pharmacotherapy changes for the treatment of obesity: None  ASSOCIATED CONDITIONS ADDRESSED TODAY  Pure hypercholesterolemia Reviewed lab results with patient LDL elevated at 124.  10-year risk score low at 3.5.  She is managed by Dr. Eden Emms for cardiomyopathy.  Currently on omega-3 fish oil supplement daily Assessment & Plan: The 10-year ASCVD risk score (  Arnett DK, et al., 2019) is: 3.5%   Values used to calculate the score:     Age: 3 years     Sex: Female     Is Non-Hispanic African American: No     Diabetic: No     Tobacco smoker: No     Systolic Blood Pressure: 116 mmHg     Is BP treated: No     HDL Cholesterol: 63 mg/dL     Total Cholesterol:  203 mg/dL   Look for cholesterol improvements with healthy lifestyle changes.  Follow-up with cardiology for further management  S/P laparoscopic sleeve gastrectomy She has volume restriction at mealtime from her sleeve gastrectomy.  She has made an effort to increase her intake of lean protein at mealtime.  She has cut back on excess snacking and high sugar foods and drinks.  she is currently on a bariatric multivitamin that has iron in it, calcium and vitamin D.  Reviewed annual bariatric labs drawn last visit including iron, B12, vitamin D and folic acid.  Continue current supplements  Primary osteoarthritis of left knee Left knee DJD pain continues to limit her ability to walk an extended period of time for exercise.  She is managed by orthopedics.  She has been able to do shorter walks throughout the day and has started tracking her steps with average of 4000 steps a day.  Continue short walks throughout the day with a goal of 5000 steps per day.  Consider referral to physical therapy to work on muscle strengthening around her left knee.  Metabolic dysfunction-associated steatotic liver disease (MASLD) Alkaline phosphatase was slightly elevated on her labs, reviewed with patient today.  AST and ALT were normal.  She has a known diagnosis of MASLD.  Begin active plan for weight reduction looking forward normalization of liver enzymes  Class 2 severe obesity due to excess calories with serious comorbidity and body mass index (BMI) of 36.0 to 36.9 in adult [facility]) Continue dietary logging.  She has room for improvement with hitting her target goal for daily calories and protein which should help alleviate some of her hunger and cravings.  Consider use of Qsymia or phentermine or Topamax separately as she has done well on this in the past.  She has also used [place] which is no longer covered by her insurance.     She was informed of the importance of frequent follow up visits to maximize her  success with intensive lifestyle modifications for her multiple health conditions.   ATTESTASTION STATEMENTS:  Reviewed by clinician on day of visit: allergies, medications, problem list, medical history, surgical history, family history, social history, and previous encounter notes pertinent to obesity diagnosis.   I have personally spent 30 minutes total time today in preparation, patient care, nutritional counseling and documentation for this visit, including the following: review of clinical lab tests; review of medical tests/procedures/services.      Glennis Brink, DO DABFM, DABOM [facility] [address] [place], [place] [address] [phone]

## 2023-03-31 ENCOUNTER — Other Ambulatory Visit: Payer: Self-pay | Admitting: Cardiovascular Disease

## 2023-04-01 NOTE — Progress Notes (Unsigned)
PATIENT: Savannah [name] DOB: 1960/10/22  REASON FOR VISIT: follow up HISTORY FROM: patient Primary Neurologist: Dr. Athena Masse   ASSESSMENT AND PLAN 63 y.o. year old female   1.  Chronic migraine headache  -Continue nortriptyline 10 mg at bedtime -Try taking tizanidine 2 mg daily for the next week or so to help with right sided neck/shoulder tension triggering migraines.  Getting massages once a week.  Then back tizanidine off as needed for migraines/muscle tension -Continue Relpax 40 mg as needed for acute migraine headache -Next steps: Higher dose nortriptyline, CGRP -Previously tried and failed: Effexor, Topamax -Encouraged to reach out via MyChart if needed, follow-up in 6 months virtually  Meds ordered this encounter  Medications   tiZANidine (ZANAFLEX) 2 MG tablet    Sig: Take 1 tablet (2 mg total) by mouth every 8 (eight) hours as needed for muscle spasms.    Dispense:  30 tablet    Refill:  3   nortriptyline (PAMELOR) 10 MG capsule    Sig: Take 1 capsule (10 mg total) by mouth at bedtime.    Dispense:  90 capsule    Refill:  1   eletriptan (RELPAX) 40 MG tablet    Sig: TAKE 1 TABLET TWICE A DAY  AS NEEDED FOR MIGRAINE OR  HEADACHE    Dispense:  6 tablet    Refill:  11     HISTORY OF PRESENT ILLNESS: Today 04/02/23  4 migraines in the last week, more than normal. Tension in her neck, right side. Gets massage once a week. Taking 10 mg at nortriptyline. It helps her get to sleep. Normally 1 migraine a month. Unclear why more migraines lately. Typically aura, then migraine in 30 minutes. Last week had aura, migraine never developed, but felt cognitively slow for the next day. Relpax works well, but like this week with frequent migraines, Relpax, rebound. Tizanidine works well for muscle tension but only gets gets tablets. Just found out her 33 yo Dad's health is declining.   Update 09/19/22 SS: Savannah [name] is here today for follow-up. Is back to work full time. Last few  weeks, more headaches. Thinks related to heat, work stress busy season. Takes Relpax with very good benefit. Not interested in another preventative. Has lost 12 lbs in 2 months with Wegovy. Has a cousin who has been diagnosed with hereditary hematochromatosis, she is having labs checked today. Another cousin diagnosed with malignant hyperthermia. Gets 6 Relpax tablets a month, doesn't use them all.   Update 09/01/20 SS: Savannah [name] here today to follow-up on migraine headaches, is doing excellent.  She had bariatric surgery in December 2021.  She stopped Effexor prior to bariatric surgery due to the pill being too large.  Fortunately, she has had very few headaches.  Last week, she did have a cluster of migraines, but Relpax worked excellent, otherwise no migraines.  She has done well to lose about 60 pounds.  In April 2022, she had a left knee replacement, had scar tissue development, had to have it removed recently.  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  She is not sure if her headaches will increase when she goes back full-time.  Here today for evaluation unaccompanied.  Update 09/01/2019 SS: Savannah [name] is a 63 year old female history of migraine headaches.  She is on Effexor and Relpax as needed.  She has previously failed Topamax.  Over the last several weeks, her headaches have increased, likely related to work-related stress.  On average now  having 2-3 migraines a week.  She takes Relpax with good benefit.  She is considering bariatric surgery.  She has chronic left knee pain, needs replacement.  Seeing chiropractor tomorrow.  Presents today for evaluation unaccompanied for  HISTORY 08/26/2018 SS: Savannah [name] is a 63 year old female with history of migraine headaches.  She has failed Topamax in the past.  She remains on Effexor 75 mg daily, Relpax as needed. She has had 2 headache in the last 6 weeks. This is a good control for her.  She will take Relpax and the headaches subside. She says  triggers are stress and weather. 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 She is still working from home. Her overall health has been good since last visit. She is in a weight reduction program. She is now down 25 lbs in the last 6 months. She is having surgery for trigger finger next week, planning left knee replacement if she loses more weight. On average she may take 2 relpax per month. She sees her women's health doctor for primary care.  She presents for follow-up unaccompanied.   REVIEW OF SYSTEMS: Out of a complete 14 system review of symptoms, the patient complains only of the following symptoms, and all other reviewed systems are negative.  See HPI  ALLERGIES: Allergies  Allergen Reactions   Dust Mite Extract Other (See Comments)    Environmental allergies   Nsaids Other (See Comments)    avoided due to bariatric surgery     HOME MEDICATIONS: Outpatient Medications Prior to Visit  Medication Sig Dispense Refill   Ca Phosphate-Cholecalciferol (CALCIUM 500 + D3) 250-500 MG-UNIT CHEW Chew 1 tablet by mouth in the morning, at noon, and at bedtime. Bariatric Calcium Citrate chewy     carvedilol (COREG) 6.25 MG tablet TAKE 1 TABLET TWICE DAILY  WITH MEALS 180 tablet 3   clobetasol cream (TEMOVATE) 0.05 % Apply 1 Application topically as needed (lichen sclerosus). Use when having a flare.  Do not use for more than 5 days. 60 g 1   eletriptan (RELPAX) 40 MG tablet TAKE 1 TABLET TWICE A DAY  AS NEEDED FOR MIGRAINE OR  HEADACHE 6 tablet 11   furosemide (LASIX) 20 MG tablet Take 0.5 tablets (10 mg total) by mouth daily as needed for edema. 45 tablet 3   hyoscyamine (LEVSIN) 0.125 MG tablet Take 0.125 mg by mouth every 4 (four) hours as needed (abdominal cramps/spasms).     lisinopril (ZESTRIL) 10 MG tablet TAKE 1 TABLET DAILY. 90 tablet 3   mometasone (ELOCON) 0.1 % ointment Use small topically twice weekly for maintenance. 45 g 3   Multiple Vitamins-Minerals (BARIATRIC MULTIVITAMINS/IRON  PO) Take 1 tablet by mouth in the morning. BariMelts Multivitamin W/Iron (chewable)     multivitamin-lutein (OCUVITE-LUTEIN) CAPS capsule Take 1 capsule by mouth in the morning.     nortriptyline (PAMELOR) 10 MG capsule Take 1 capsule (10 mg total) by mouth at bedtime. 30 capsule 5   Omega-3 1000 MG CAPS Take by mouth.     tiZANidine (ZANAFLEX) 2 MG tablet Take 1 tablet (2 mg total) by mouth every 8 (eight) hours as needed for muscle spasms. 20 tablet 1   No facility-administered medications prior to visit.    PAST MEDICAL HISTORY: Past Medical History:  Diagnosis Date   Arthritis    bilateral knees, recent LCL sprain-on left knee   CHF (congestive heart failure) (HCC)    Dysrhythmia    trigem   Environmental allergies  Family history of adverse reaction to anesthesia    slow to wake up   Fatty liver    Headache(784.0)    migraines-on meds for control, relpax, ketoprophen, vicoden, muscle relaxer   Hearing loss 2016   High frequency hearing - Bilateral    History of hiatal hernia    Hot flashes    Joint pain    Lichen sclerosus    Lipoma    Right Knee   Menorrhagia    Migraines    Necrobiosis lipoidica    Osteoarthritis    Overweight    Pelvic pain in female    PONV (postoperative nausea and vomiting)    ponv, slow to awake   Pre-diabetes    Seasonal allergies    Sinus problem    Sprain 10/25/2010   left knee   Trigeminal pulse    Trigger finger, right    Middle finger and right wrist    Vitamin D deficiency     PAST SURGICAL HISTORY: Past Surgical History:  Procedure Laterality Date   APPENDECTOMY     CHOLECYSTECTOMY     DIAGNOSTIC LAPAROSCOPY  08/2006   DILATION AND CURETTAGE OF UTERUS  2011   attempted ablation   FUNCTIONAL ENDOSCOPIC SINUS SURGERY     ganglion cyst removal     HYSTEROSCOPY     failed   KNEE ARTHROPLASTY Right 2018   meniscus   KNEE CLOSED REDUCTION Left 08/18/2020   Procedure: LEFT KNEE MANIPULATION UNDER ANESTHESIA;  Surgeon: Tarry Kos, MD;  Location: [facility];  Service: Orthopedics;  Laterality: Left;   LAPAROSCOPIC GASTRIC SLEEVE RESECTION  01/2020   LASIK     LIPOMA EXCISION Right 07/22/2014   Procedure: EXCISION RIGHT THIGH LIPOMA;  Surgeon: Harriette Bouillon, MD;  Location: [facility];  Service: General;  Laterality: Right;   ROBOTIC ASSISTED TOTAL HYSTERECTOMY  11/06/10   TLH/RSO   SALPINGOOPHORECTOMY  11/06/2010   Procedure: SALPINGO OOPHERECTOMY;  Surgeon: Lum Keas;  Location: [facility];  Service: Gynecology;  Laterality: Right;   TOTAL KNEE ARTHROPLASTY Left 05/23/2020   Procedure: LEFT TOTAL KNEE ARTHROPLASTY;  Surgeon: Tarry Kos, MD;  Location: [facility];  Service: Orthopedics;  Laterality: Left;   TRIGGER FINGER RELEASE     UPPER GI ENDOSCOPY N/A 02/02/2020   Procedure: UPPER GI ENDOSCOPY;  Surgeon: Gaynelle Adu, MD;  Location: [facility];  Service: General;  Laterality: N/A;    FAMILY HISTORY: Family History  Problem Relation Age of Onset   Hyperlipidemia Mother    Hypertension Mother    Skin cancer Mother    Migraines Mother    High blood pressure Mother    Hypertension Father    Hyperlipidemia Father    Heart disease Father    High blood pressure Father    High Cholesterol Father    Migraines Brother    Breast cancer Maternal Aunt 110   Breast cancer Paternal Aunt 90    SOCIAL HISTORY: Social History   Socioeconomic History   Marital status: Married    Spouse name: Renae Fickle Magee   Number of children: 0   Years of education: 18   Highest education level: Not on file  Occupational History   Occupation: Regulatory affairs officer  Tobacco Use   Smoking status: Never   Smokeless tobacco: Never  Vaping Use   Vaping status: Never Used  Substance and Sexual Activity   Alcohol use: No    Alcohol/week: 0.0 standard drinks of alcohol  Drug use: No   Sexual activity: Yes    Birth control/protection: Surgical    Comment: TLH/RSO  Other Topics Concern   Not on  file  Social History Narrative   Lives w/ husband, Renae Fickle   Caffeine use: Diet coke girl   Right handed    Social Drivers of Corporate investment banker Strain: Not on file  Food Insecurity: No Food Insecurity (10/23/2019)   Hunger Vital Sign    Worried About Running Out of Food in the Last Year: Never true    Ran Out of Food in the Last Year: Never true  Transportation Needs: Not on file  Physical Activity: Not on file  Stress: Not on file  Social Connections: Not on file  Intimate Partner Violence: Not on file   PHYSICAL EXAM  Vitals:   04/02/23 0737  BP: 110/72  Pulse: 92  Weight: 220 lb (99.8 kg)  Height: 5' 4.75" (1.645 m)   Body mass index is 36.89 kg/m.  Generalized: Well developed, in no acute distress   Neurological examination  Mentation: Alert oriented to time, place, history taking. Follows all commands speech and language fluent Cranial nerve II-XII: Pupils were equal round reactive to light. Extraocular movements were full, visual field were full on confrontational test. Facial sensation and strength were normal. Head turning and shoulder shrug  were normal and symmetric. Motor: The motor testing reveals 5 over 5 strength of all 4 extremities. Good symmetric motor tone is noted throughout.  Sensory: Sensory testing is intact to soft touch on all 4 extremities. No evidence of extinction is noted.  Coordination: Cerebellar testing reveals good finger-nose-finger and heel-to-shin bilaterally.  Gait and station: Gait is normal  DIAGNOSTIC DATA (LABS, IMAGING, TESTING) - I reviewed patient records, labs, notes, testing and imaging myself where available.  Lab Results  Component Value Date   WBC 7.4 03/12/2023   HGB 13.8 03/12/2023   HCT 41.2 03/12/2023   MCV 93 03/12/2023   PLT 315 03/12/2023      Component Value Date/Time   NA 140 03/12/2023 0925   K 4.4 03/12/2023 0925   CL 103 03/12/2023 0925   CO2 23 03/12/2023 0925   GLUCOSE 86 03/12/2023 0925    GLUCOSE 87 05/24/2020 0754   BUN 15 03/12/2023 0925   CREATININE 0.97 03/12/2023 0925   CREATININE 0.85 04/15/2015 1512   CALCIUM 9.7 03/12/2023 0925   PROT 6.8 03/12/2023 0925   ALBUMIN 4.1 03/12/2023 0925   AST 20 03/12/2023 0925   ALT 17 03/12/2023 0925   ALKPHOS 162 (H) 03/12/2023 0925   BILITOT 0.5 03/12/2023 0925   GFRNONAA >60 05/24/2020 0754   GFRAA >60 07/02/2019 1115   Lab Results  Component Value Date   CHOL 203 (H) 03/12/2023   HDL 63 03/12/2023   LDLCALC 124 (H) 03/12/2023   TRIG 91 03/12/2023   CHOLHDL 3.2 03/12/2023   Lab Results  Component Value Date   HGBA1C 5.6 03/12/2023   Lab Results  Component Value Date   VITAMINB12 733 03/12/2023   Lab Results  Component Value Date   TSH 3.220 03/12/2023    Margie Ege, AGNP-C, DNP 04/02/2023, 7:42 AM [facility] [address] [place], [place] [address] [phone]

## 2023-04-02 ENCOUNTER — Ambulatory Visit: Payer: 59 | Admitting: Neurology

## 2023-04-02 ENCOUNTER — Encounter: Payer: Self-pay | Admitting: Neurology

## 2023-04-02 VITALS — BP 110/72 | HR 92 | Ht 64.75 in | Wt 220.0 lb

## 2023-04-02 DIAGNOSIS — G43909 Migraine, unspecified, not intractable, without status migrainosus: Secondary | ICD-10-CM | POA: Diagnosis not present

## 2023-04-02 MED ORDER — TIZANIDINE HCL 2 MG PO TABS: 2.0000 mg | ORAL_TABLET | Freq: Three times a day (TID) | ORAL | 3 refills | Status: DC | PRN

## 2023-04-02 MED ORDER — NORTRIPTYLINE HCL 10 MG PO CAPS: 10.0000 mg | ORAL_CAPSULE | Freq: Every day | ORAL | 1 refills | Status: DC

## 2023-04-02 MED ORDER — ELETRIPTAN HYDROBROMIDE 40 MG PO TABS: ORAL_TABLET | ORAL | 11 refills | Status: DC

## 2023-04-02 NOTE — Patient Instructions (Signed)
Great to see you today.  Continue nortriptyline 10 mg at bedtime for migraine preventative.  Try taking tizanidine daily for the next week or so to help with muscle tension.  Then take tizanidine as needed for migraine, continue Relpax.  Reach out if migraines increase.  I will see you back in 6 months.  Thanks!!

## 2023-04-18 ENCOUNTER — Encounter: Payer: Self-pay | Admitting: Family Medicine

## 2023-04-18 ENCOUNTER — Ambulatory Visit: Payer: 59 | Admitting: Family Medicine

## 2023-04-18 VITALS — BP 117/77 | HR 102 | Temp 98.3°F | Ht 64.5 in | Wt 219.0 lb

## 2023-04-18 DIAGNOSIS — Z6837 Body mass index (BMI) 37.0-37.9, adult: Secondary | ICD-10-CM

## 2023-04-18 DIAGNOSIS — Z634 Disappearance and death of family member: Secondary | ICD-10-CM | POA: Diagnosis not present

## 2023-04-18 DIAGNOSIS — E66812 Obesity, class 2: Secondary | ICD-10-CM | POA: Diagnosis not present

## 2023-04-18 DIAGNOSIS — Z9884 Bariatric surgery status: Secondary | ICD-10-CM

## 2023-04-18 DIAGNOSIS — M1712 Unilateral primary osteoarthritis, left knee: Secondary | ICD-10-CM | POA: Diagnosis not present

## 2023-04-18 MED ORDER — PEN NEEDLES 32G X 5 MM MISC
0 refills | Status: DC
Start: 2023-04-18 — End: 2023-06-11

## 2023-04-18 MED ORDER — LIRAGLUTIDE -WEIGHT MANAGEMENT 18 MG/3ML ~~LOC~~ SOPN
0.6000 mg | PEN_INJECTOR | Freq: Every day | SUBCUTANEOUS | 0 refills | Status: DC
Start: 2023-04-18 — End: 2023-06-11

## 2023-04-18 NOTE — Progress Notes (Signed)
 Office: [phone]  /  Fax: [phone]  WEIGHT SUMMARY AND BIOMETRICS  Starting Date: 03/12/23  Starting Weight: 217lb   Weight Lost Since Last Visit: 0lb   Vitals Temp: 98.3 F (36.8 C) BP: 117/77 Pulse Rate: (!) 102 SpO2: 93 %   Body Composition  Body Fat %: 48.1 % Fat Mass (lbs): 105.4 lbs Muscle Mass (lbs): 107.8 lbs Total Body Water (lbs): 84.8 lbs Visceral Fat Rating : 15    HPI  Chief Complaint: OBESITY  Savannah [name] is here to discuss her progress with her obesity treatment plan. She is on the keeping a food journal and adhering to recommended goals of 1200-1400 calories and 90 protein and states she is following her eating plan approximately 15-20 % of the time. She states she is doing 5000 steps daily.    Interval History:  Since last office visit she is up 2 lb She has a net weight gain of 2 lb in 1 month She lost her dad after her last visit here in Feb. She has a good support system She had to travel and has not been tracking her intake Admits to poor food choices She is tracking steps with a goal of 5,000 steps/ day-- she reaches this 3-4 x a week L knee DJD is tolerating walking She is not sleeping well  and she is having more migraines She is getting a weekly massage and has a muscle relaxer to take She is s/p VSG Weight history: pre op VSG Dec 2021 283 lb--> 220 lb and then down to 187 lb with Wegovy and has regained to 216 lb off of Wegovy.  Stopped due to lack of insurance coverage.    Pharmacotherapy: none  PHYSICAL EXAM:  Blood pressure 117/77, pulse (!) 102, temperature 98.3 F (36.8 C), height 5' 4.5" (1.638 m), weight 219 lb (99.3 kg), last menstrual period 10/14/2010, SpO2 93%. Body mass index is 37.01 kg/m.  General: She is overweight, cooperative, alert, well developed, and in no acute distress. PSYCH: Has normal mood, affect and thought process.   Lungs: Normal breathing effort, no conversational dyspnea.   ASSESSMENT AND  PLAN  TREATMENT PLAN FOR OBESITY:  Recommended Dietary Goals  Rut is currently in the action stage of change. As such, her goal is to continue weight management plan. She has agreed to keeping a food journal and adhering to recommended goals of 1400 calories and 90+ g of  protein and following a lower carbohydrate, vegetable and lean protein rich diet plan.  Behavioral Intervention  We discussed the following Behavioral Modification Strategies today: increasing lean protein intake to established goals, increasing fiber rich foods, avoiding skipping meals, increasing water intake , work on meal planning and preparation, work on tracking and journaling calories using tracking application, keeping healthy foods at home, identifying sources and decreasing liquid calories, decreasing eating out or consumption of processed foods, and making healthy choices when eating convenient foods, practice mindfulness eating and understand the difference between hunger signals and cravings, work on managing stress, creating time for self-care and relaxation, avoiding temptations and identifying enticing environmental cues, and continue to work on maintaining a reduced calorie state, getting the recommended amount of protein, incorporating whole foods, making healthy choices, staying well hydrated and practicing mindfulness when eating..  Additional resources provided today: NA  Recommended Physical Activity Goals  Elisa has been advised to work up to 150 minutes of moderate intensity aerobic activity a week and strengthening exercises 2-3 times per week for cardiovascular health,  weight loss maintenance and preservation of muscle mass.   She has agreed to Start aerobic activity with a goal of 150 minutes a week at moderate intensity.  Increase daily step goal to 6,000 per day Increase walking on weekends  Pharmacotherapy changes for the treatment of obesity: begin Liraglutiude (generic, cash pay) 0.6 mg  daily Patient denies a personal or family history of pancreatitis, medullary thyroid carcinoma or multiple endocrine neoplasia type II. Recommend reviewing pen training video online.   ASSOCIATED CONDITIONS ADDRESSED TODAY  Primary osteoarthritis of left knee L knee pain is tolerating walking well without problem Continue active plan for weight loss  Class 2 obesity due to excess calories with body mass index (BMI) of 37.0 to 37.9 in adult, unspecified whether serious comorbidity present Reviewed MOA and potential adverse SE of Liraglutide Previously did well on Wegovy but neither this or Zepbound are covered by insurance Reviewed website for [employer] with patient to teach pen training information  -     Liraglutide -Weight Management; Inject 0.6 mg into the skin daily.  Dispense: 9 mL; Refill: 0 -     Pen Needles; Use once daily as directed  Dispense: 90 each; Refill: 0  S/P laparoscopic sleeve gastrectomy Taking a Bariatric MVI daily Wants to get back to nadir weight of 187 lb Has room for exercise and dietary logging adherence  Death of parent New Lost dad in Feb at age 70 without acute cause Has 2 brothers living and her husband is supportive Normal grief reaction and she seems ready to improve her overall health     She was informed of the importance of frequent follow up visits to maximize her success with intensive lifestyle modifications for her multiple health conditions.   ATTESTASTION STATEMENTS:  Reviewed by clinician on day of visit: allergies, medications, problem list, medical history, surgical history, family history, social history, and previous encounter notes pertinent to obesity diagnosis.   I have personally spent 30 minutes total time today in preparation, patient care, nutritional counseling and documentation for this visit, including the following: review of clinical lab tests; review of medical tests/procedures/services.      Glennis Brink, DO DABFM,  DABOM [facility] [address] [place], [place] [address] [phone]

## 2023-04-18 NOTE — Patient Instructions (Signed)
 1400 calories per day Aim for 90 g of protein daily  Aim for 6,000 steps/ day  Hydrate well with water outside of mealtime  Start on Liraglutide 0.6 mg daily injection  After one week, you can go up to 1.2 mg daily injection Call me if any problems or questions

## 2023-05-14 ENCOUNTER — Ambulatory Visit: Admitting: Family Medicine

## 2023-05-14 ENCOUNTER — Encounter: Payer: Self-pay | Admitting: Family Medicine

## 2023-05-14 VITALS — BP 110/74 | HR 87 | Temp 98.1°F | Ht 64.5 in | Wt 221.0 lb

## 2023-05-14 DIAGNOSIS — K760 Fatty (change of) liver, not elsewhere classified: Secondary | ICD-10-CM

## 2023-05-14 DIAGNOSIS — E66812 Obesity, class 2: Secondary | ICD-10-CM

## 2023-05-14 DIAGNOSIS — Z6837 Body mass index (BMI) 37.0-37.9, adult: Secondary | ICD-10-CM

## 2023-05-14 DIAGNOSIS — Z789 Other specified health status: Secondary | ICD-10-CM | POA: Insufficient documentation

## 2023-05-14 DIAGNOSIS — Z9884 Bariatric surgery status: Secondary | ICD-10-CM

## 2023-05-14 NOTE — Progress Notes (Signed)
 Office: [phone]  /  Fax: [phone]  WEIGHT SUMMARY AND BIOMETRICS  Starting Date: 03/12/23  Starting Weight: 217lb   Weight Lost Since Last Visit: 0lb   Vitals Temp: 98.1 F (36.7 C) BP: 110/74 Pulse Rate: 87 SpO2: 96 %   Body Composition  Body Fat %: 48.3 % Fat Mass (lbs): 106.8 lbs Muscle Mass (lbs): 108.6 lbs Total Body Water (lbs): 86.2 lbs Visceral Fat Rating : 15    HPI  Chief Complaint: OBESITY  Savannah [name] is here to discuss her progress with her obesity treatment plan. She is on the keeping a food journal and adhering to recommended goals of 1400 calories and 90 protein and states she is following her eating plan approximately 60-70 % of the time. She states she getting 5000-6000 steps in daily and working around the house.   Interval History:  Since last office visit she is up 2 lb She is doing Saxenda (gen) 1.2 mg daily She has volume control at meals s/p SG but has not seen any changes since starting Saxenda She feels like she is overeating carbs and undereating protein She is walking daily, getting close to 6,000 steps/ day She is working on improving sleep time- avg 5-6 hrs/ night She goes out for lunch daily while at work (sweet tea 3 days/ wk) She avoids salad dressing and eats 1/2- 1/3 of her meal She is not tracking her calories She had previously had success on Wegovy for post op regain s/p VSG Dec 2021  Pharmacotherapy: Saxenda (cash pay) 1.2 mg daily injection  PHYSICAL EXAM:  Blood pressure 110/74, pulse 87, temperature 98.1 F (36.7 C), height 5' 4.5" (1.638 m), weight 221 lb (100.2 kg), last menstrual period 10/14/2010, SpO2 96%. Body mass index is 37.35 kg/m.  General: She is overweight, cooperative, alert, well developed, and in no acute distress. PSYCH: Has normal mood, affect and thought process.   Lungs: Normal breathing effort, no conversational dyspnea.   ASSESSMENT AND PLAN  TREATMENT PLAN FOR OBESITY:  Recommended  Dietary Goals  Cleva is currently in the action stage of change. As such, her goal is to continue weight management plan. She has agreed to keeping a food journal and adhering to recommended goals of 1400 calories and 90 g of  protein and following a lower carbohydrate, vegetable and lean protein rich diet plan.  Behavioral Intervention  We discussed the following Behavioral Modification Strategies today: increasing lean protein intake to established goals, increasing fiber rich foods, increasing water intake , work on meal planning and preparation, work on Counselling psychologist calories using tracking application, keeping healthy foods at home, identifying sources and decreasing liquid calories, avoiding temptations and identifying enticing environmental cues, continue to work on implementation of reduced calorie nutritional plan, planning for success, and continue to work on maintaining a reduced calorie state, getting the recommended amount of protein, incorporating whole foods, making healthy choices, staying well hydrated and practicing mindfulness when eating..  Additional resources provided today: NA  Recommended Physical Activity Goals  Lasean has been advised to work up to 150 minutes of moderate intensity aerobic activity a week and strengthening exercises 2-3 times per week for cardiovascular health, weight loss maintenance and preservation of muscle mass.   She has agreed to Increase the intensity, frequency or duration of aerobic exercises    Pharmacotherapy changes for the treatment of obesity: increase Saxenda to 1.8 mg daily injection  ASSOCIATED CONDITIONS ADDRESSED TODAY  Difficulty managing dietary regimen [name] has struggled with  dietary recommendations since her VSG, consuming added sugar, starches, frequent meals out with insufficient intake of lean protein and veggies.  She is making a conscious effort to work on some of these changes.   Dicussed cutting out sweet  tea and choosing unsweet tea (bringing packets of Monkfruit in purse), improving snack choices and limiting portion sizes of high calorie density meals.  Class 2 obesity due to excess calories with body mass index (BMI) of 37.0 to 37.9 in adult, unspecified whether serious comorbidity present  S/P laparoscopic sleeve gastrectomy Annual post bariatric surgery labs are UTD She is taking a MVI daily as recommended She is working on eating on a schedule with smaller meals 3-4 x a day  Be sure to hydrate well 30 min outside of meal time with sugar free drinks/ water  Metabolic dysfunction-associated steatotic liver disease (MASLD) Continue active plan for weight loss for the treatment of MASLD Aim for a visceral fat rating < 10     She was informed of the importance of frequent follow up visits to maximize her success with intensive lifestyle modifications for her multiple health conditions.   ATTESTASTION STATEMENTS:  Reviewed by clinician on day of visit: allergies, medications, problem list, medical history, surgical history, family history, social history, and previous encounter notes pertinent to obesity diagnosis.   I have personally spent 30 minutes total time today in preparation, patient care, nutritional counseling and documentation for this visit, including the following: review of clinical lab tests; review of medical tests/procedures/services.      Glennis Brink, DO DABFM, DABOM [facility] [address] [place], [place] [address] [phone]

## 2023-05-14 NOTE — Patient Instructions (Addendum)
 Increase Saxenda to 1.8 mg daily injection daily  Let me know after one week how that is working for you  The next 2 dose increases of Saxenda are 2.4 mg and 3 mg  Try to log intake on the MyFitnessPal ap Aim for 1400 cal/ day with 90 g of protein daily  Try to swap out sweet tea for unsweet (OK to use Splenda or Monkfruit)

## 2023-06-04 ENCOUNTER — Other Ambulatory Visit (INDEPENDENT_AMBULATORY_CARE_PROVIDER_SITE_OTHER)

## 2023-06-04 ENCOUNTER — Ambulatory Visit: Admitting: Orthopaedic Surgery

## 2023-06-04 DIAGNOSIS — M2021 Hallux rigidus, right foot: Secondary | ICD-10-CM | POA: Diagnosis not present

## 2023-06-04 NOTE — Progress Notes (Signed)
 Office Visit Note   Patient: Savannah [name]           Date of Birth: [birth year]           MRN: [id number] Visit Date: 06/04/2023              Requested by: Jimmey Mould, MD [address] [place],  [place] [address] PCP: Jimmey Mould, MD   Assessment & Plan: Visit Diagnoses:  1. Hallux rigidus, right foot     Plan: Patient is a 63 year old female with right great toe hallux rigidus.  She is mainly symptomatic from the dorsal osteophyte.  No pain in the mid range or with grind test.  Treatment options discussed and she has elected to move forward with a dorsal cheilectomy.  Detailed surgical plan including risk benefits prognosis reviewed.  Stephenie Einstein will call her to confirm surgery date.  Follow-Up Instructions: No follow-ups on file.   Orders:  Orders Placed This Encounter  Procedures   XR Toe Great Right   No orders of the defined types were placed in this encounter.     Procedures: No procedures performed   Clinical Data: No additional findings.   Subjective: Chief Complaint  Patient presents with   Right Foot - Pain    Great toe    HPI Savannah [name] is a well-known patient of mine who comes in for evaluation of right great toe pain for at least 6 months.  She has been using rigid orthotics to help manage the pain but they are no longer useful.  She has pain at rest.  She is very tender over the dorsal aspect of the MTP joint. Review of Systems  Constitutional: Negative.   HENT: Negative.    Eyes: Negative.   Respiratory: Negative.    Cardiovascular: Negative.   Endocrine: Negative.   Musculoskeletal: Negative.   Neurological: Negative.   Hematological: Negative.   Psychiatric/Behavioral: Negative.    All other systems reviewed and are negative.    Objective: Vital Signs: LMP 10/14/2010   Physical Exam Vitals and nursing note reviewed.  Constitutional:      Appearance: She is well-developed.  HENT:     Head: Atraumatic.     Nose: Nose  normal.  Eyes:     Extraocular Movements: Extraocular movements intact.  Cardiovascular:     Pulses: Normal pulses.  Pulmonary:     Effort: Pulmonary effort is normal.  Abdominal:     Palpations: Abdomen is soft.  Musculoskeletal:     Cervical back: Neck supple.  Skin:    General: Skin is warm.     Capillary Refill: Capillary refill takes less than 2 seconds.  Neurological:     Mental Status: She is alert. Mental status is at baseline.  Psychiatric:        Behavior: Behavior normal.        Thought Content: Thought content normal.        Judgment: Judgment normal.     Ortho Exam Examination of the right great toe shows no deformities.  Normal EHL function.  She is very tender to the dorsal lateral aspect of the MTP joint with a palpable underlying osteophyte.  Normal capillary refill and sensation.  She has no pain with mid range of motion.  She has increased pain with terminal dorsiflexion and plantarflexion. Specialty Comments:  No specialty comments available.  Imaging: XR Toe Great Right Result Date: 06/04/2023 X-rays of the great toe show degenerative osteophytic spurring of the MTP  joint most prominently towards the lateral aspect of the joint.  Dorsal osteophyte is present.    PMFS History: Patient Active Problem List   Diagnosis Date Noted   Difficulty managing dietary regimen 06/05/23   Death of parent May 10, 2023   Pure hypercholesterolemia 03/28/2023   Chronic right shoulder pain 10/23/2022   Great toe pain, right 10/23/2022   Osteopenia 06/28/2022   Arthritis of right knee 06/30/2021   Cardiomyopathy (HCC) 03/10/2021   Stiffness of left knee 08/18/2020   Status post total left knee replacement 05/23/2020   Primary osteoarthritis of left knee 05/05/2020   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   Metabolic dysfunction-associated steatotic liver disease (MASLD) 04/02/2019   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   Trigger finger, right  middle finger 10/10/2018   Other insomnia 06/23/2018   Vitamin D  deficiency 03/26/2018   Prediabetes 03/11/2018   Obesity 02/26/2018   Migraines 10/01/2016   Decreased cardiac ejection fraction 10/01/2016   Severe obesity (HCC) 10/01/2016   Shingles 08/03/2016   Chronic pain of right knee 05/24/2016   Unilateral primary osteoarthritis, right knee 05/24/2016   Posterior tibial tendinitis, right leg 01/30/2016   Lichen sclerosus 12/23/2013   Past Medical History:  Diagnosis Date   Arthritis    bilateral knees, recent LCL sprain-on left knee   CHF (congestive heart failure) (HCC)    Dysrhythmia    trigem   Environmental allergies    Family history of adverse reaction to anesthesia    slow to wake up   Fatty liver    Headache(784.0)    migraines-on meds for control, relpax , ketoprophen, vicoden, muscle relaxer   Hearing loss 2016   High frequency hearing - Bilateral    History of hiatal hernia    Hot flashes    Joint pain    Lichen sclerosus    Lipoma    Right Knee   Menorrhagia    Migraines    Necrobiosis lipoidica    Osteoarthritis    Overweight    Pelvic pain in female    PONV (postoperative nausea and vomiting)    ponv, slow to awake   Pre-diabetes    Seasonal allergies    Sinus problem    Sprain 10/25/2010   left knee   Trigeminal pulse    Trigger finger, right    Middle finger and right wrist    Vitamin D  deficiency     Family History  Problem Relation Age of Onset   Hyperlipidemia Mother    Hypertension Mother    Skin cancer Mother    Migraines Mother    High blood pressure Mother    Hypertension Father    Hyperlipidemia Father    Heart disease Father    High blood pressure Father    High Cholesterol Father    Migraines Brother    Breast cancer Maternal Aunt 55   Breast cancer Paternal Aunt 90    Past Surgical History:  Procedure Laterality Date   APPENDECTOMY     CHOLECYSTECTOMY     DIAGNOSTIC LAPAROSCOPY  08/2006   DILATION AND CURETTAGE  OF UTERUS  2011   attempted ablation   FUNCTIONAL ENDOSCOPIC SINUS SURGERY     ganglion cyst removal     HYSTEROSCOPY     failed   KNEE ARTHROPLASTY Right 2018   meniscus   KNEE CLOSED REDUCTION Left 08/18/2020   Procedure: LEFT KNEE MANIPULATION UNDER ANESTHESIA;  Surgeon: Wes Hamman, MD;  Location: [facility];  Service:  Orthopedics;  Laterality: Left;   LAPAROSCOPIC GASTRIC SLEEVE RESECTION  01/2020   LASIK     LIPOMA EXCISION Right 07/22/2014   Procedure: EXCISION RIGHT THIGH LIPOMA;  Surgeon: Sim Dryer, MD;  Location: [facility];  Service: General;  Laterality: Right;   ROBOTIC ASSISTED TOTAL HYSTERECTOMY  11/06/10   TLH/RSO   SALPINGOOPHORECTOMY  11/06/2010   Procedure: SALPINGO OOPHERECTOMY;  Surgeon: Ammie Bale;  Location: [facility];  Service: Gynecology;  Laterality: Right;   TOTAL KNEE ARTHROPLASTY Left 05/23/2020   Procedure: LEFT TOTAL KNEE ARTHROPLASTY;  Surgeon: Wes Hamman, MD;  Location: [facility];  Service: Orthopedics;  Laterality: Left;   TRIGGER FINGER RELEASE     UPPER GI ENDOSCOPY N/A 02/02/2020   Procedure: UPPER GI ENDOSCOPY;  Surgeon: Aldean Hummingbird, MD;  Location: [facility];  Service: General;  Laterality: N/A;   Social History   Occupational History   Occupation: Regulatory affairs officer  Tobacco Use   Smoking status: Never   Smokeless tobacco: Never  Vaping Use   Vaping status: Never Used  Substance and Sexual Activity   Alcohol use: No    Alcohol/week: 0.0 standard drinks of alcohol   Drug use: No   Sexual activity: Yes    Birth control/protection: Surgical    Comment: TL[facility]/RSO

## 2023-06-11 ENCOUNTER — Encounter: Payer: Self-pay | Admitting: Family Medicine

## 2023-06-11 ENCOUNTER — Ambulatory Visit: Admitting: Family Medicine

## 2023-06-11 VITALS — BP 115/73 | HR 88 | Temp 97.9°F | Ht 64.5 in | Wt 221.0 lb

## 2023-06-11 DIAGNOSIS — E6609 Other obesity due to excess calories: Secondary | ICD-10-CM

## 2023-06-11 DIAGNOSIS — E66812 Obesity, class 2: Secondary | ICD-10-CM

## 2023-06-11 DIAGNOSIS — Z6837 Body mass index (BMI) 37.0-37.9, adult: Secondary | ICD-10-CM

## 2023-06-11 DIAGNOSIS — Z9884 Bariatric surgery status: Secondary | ICD-10-CM | POA: Diagnosis not present

## 2023-06-11 DIAGNOSIS — R7303 Prediabetes: Secondary | ICD-10-CM | POA: Diagnosis not present

## 2023-06-11 MED ORDER — WEGOVY 0.5 MG/0.5ML ~~LOC~~ SOAJ
0.5000 mg | SUBCUTANEOUS | 0 refills | Status: DC
Start: 2023-06-11 — End: 2023-07-15

## 2023-06-11 NOTE — Progress Notes (Signed)
 Office: [phone]  /  Fax: [phone]  WEIGHT SUMMARY AND BIOMETRICS  Starting Date: 03/12/23  Starting Weight: 217lb   Weight Lost Since Last Visit: 0lb   Vitals Temp: 97.9 F (36.6 C) BP: 115/73 Pulse Rate: 88 SpO2: 99 %   Body Composition  Body Fat %: 48.7 % Fat Mass (lbs): 107.6 lbs Muscle Mass (lbs): 107.6 lbs Total Body Water  (lbs): 87.8 lbs Visceral Fat Rating : 15   HPI  Chief Complaint: OBESITY  Savannah [name] is here to discuss her progress with her obesity treatment plan. She is on the keeping a food journal and adhering to recommended goals of 1400 calories and 90 protein and states she is following her eating plan approximately 65-70 % of the time. She states she is doing lots of yard work.   Interval History:  Since last office visit she is down 0 lb This gives her a net weight gain of 4 lb in 3 mos She is s/p VSG in Dec 2021 up from nadir weight of 187 (on Wegovy  post op) She will be stopping Victoza  after tomorrow due to an upcoming surgery (and running out) She did ramp up Victoza  to 1.8 mg daily with limited improvements in appetite She has continued to craves carbs some days more than others She has been walking more and doing outdoor activity She has R Hallux Rigidus that has been hurting, managed by Dr Christiane Cowing-- this will be her upcoming surgery She will celebrate her birthday this [birth year] She has been cutting out sweet tea  Pharmacotherapy: victoza  1.8 mg daily (cash pay)  PHYSICAL EXAM:  Blood pressure 115/73, pulse 88, temperature 97.9 F (36.6 C), height 5' 4.5" (1.638 m), weight 221 lb (100.2 kg), last menstrual period 10/14/2010, SpO2 99%. Body mass index is 37.35 kg/m.  General: She is overweight, cooperative, alert, well developed, and in no acute distress. PSYCH: Has normal mood, affect and thought process.   Lungs: Normal breathing effort, no conversational dyspnea.   ASSESSMENT AND PLAN  TREATMENT PLAN FOR OBESITY:  Recommended  Dietary Goals  Savannah [name] is currently in the action stage of change. As such, her goal is to continue weight management plan. She has agreed to keeping a food journal and adhering to recommended goals of 1400 calories and 90 g of  protein.  Behavioral Intervention  We discussed the following Behavioral Modification Strategies today: increasing lean protein intake to established goals, increasing fiber rich foods, increasing water  intake , work on meal planning and preparation, work on Counselling psychologist calories using tracking application, keeping healthy foods at home, practice mindfulness eating and understand the difference between hunger signals and cravings, work on managing stress, creating time for self-care and relaxation, avoiding temptations and identifying enticing environmental cues, planning for success, and continue to work on maintaining a reduced calorie state, getting the recommended amount of protein, incorporating whole foods, making healthy choices, staying well hydrated and practicing mindfulness when eating..  Additional resources provided today: NA  Recommended Physical Activity Goals  Savannah [name] has been advised to work up to 150 minutes of moderate intensity aerobic activity a week and strengthening exercises 2-3 times per week for cardiovascular health, weight loss maintenance and preservation of muscle mass.   She has agreed to Start aerobic activity with a goal of 150 minutes a week at moderate intensity.   Pharmacotherapy changes for the treatment of obesity: d/c Victoza  Begin Wegovy  0.5 mg weekly  ASSOCIATED CONDITIONS ADDRESSED TODAY  Prediabetes Lab Results  Component Value  Date   HGBA1C 5.6 03/12/2023   She has seen improvements in A1c and fasting insulin  She did have a brief experience on metformin  with GI upset She has started to reducing her intake of carbs and sugar with some success She has been more active but has room to ramp up to > 30 min  daily  Class 2 obesity due to excess calories with body mass index (BMI) of 37.0 to 37.9 in adult, unspecified whether serious comorbidity present -     Wegovy ; Inject 0.5 mg into the skin once a week.  Dispense: 2 mL; Refill: 0  S/P laparoscopic sleeve gastrectomy She has improvements in portion sizes at mealtime on Victoza  1.8 mg daily but has failed to see any weight loss.  Due to cash paying for this and not seeing results, she agrees to change over to Wegovy  which did work for her in the past as a cash pay.  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    Stay on a MVI daily.     She was informed of the importance of frequent follow up visits to maximize her success with intensive lifestyle modifications for her multiple health conditions.   ATTESTASTION STATEMENTS:  Reviewed by clinician on day of visit: allergies, medications, problem list, medical history, surgical history, family history, social history, and previous encounter notes pertinent to obesity diagnosis.   I have personally spent 30 minutes total time today in preparation, patient care, nutritional counseling and education,  and documentation for this visit, including the following: review of most recent clinical lab tests, prescribing medications/ refilling medications, reviewing medical assistant documentation, review and interpretation of bioimpedence results.     [name], D.O. DABFM, DABOM [facility] [address] [place], [place] [address] [phone]

## 2023-06-12 ENCOUNTER — Ambulatory Visit: Admitting: Family Medicine

## 2023-06-13 ENCOUNTER — Other Ambulatory Visit: Payer: Self-pay | Admitting: Physician Assistant

## 2023-06-13 MED ORDER — ONDANSETRON HCL 4 MG PO TABS: 4.0000 mg | ORAL_TABLET | Freq: Three times a day (TID) | ORAL | 0 refills | Status: AC | PRN

## 2023-06-13 MED ORDER — HYDROCODONE-ACETAMINOPHEN 5-325 MG PO TABS: 1.0000 | ORAL_TABLET | Freq: Three times a day (TID) | ORAL | 0 refills | Status: AC | PRN

## 2023-06-19 ENCOUNTER — Other Ambulatory Visit: Payer: Self-pay | Admitting: Physician Assistant

## 2023-06-20 DIAGNOSIS — M2021 Hallux rigidus, right foot: Secondary | ICD-10-CM | POA: Diagnosis not present

## 2023-06-21 ENCOUNTER — Telehealth: Payer: Self-pay | Admitting: Radiology

## 2023-06-21 NOTE — Telephone Encounter (Signed)
 Patient states that her right leg is still numb, with no feeling at all from the block yesterday due to surgery. She is concerned and asking for a call back [phone]

## 2023-06-21 NOTE — Telephone Encounter (Signed)
 Spoke with patient. Told her that it could last until sometime tomorrow possibly. I explained that she should be prepared that it will hurt once that block wears off.

## 2023-06-28 NOTE — Progress Notes (Signed)
 Telehealth Visit        Date:  07/05/2023   ID:  Savannah [name], DOB 1960/10/07, MRN [id number]  PCP:  Jimmey Mould, MD  Cardiologist:  Janelle Mediate, MD  Electrophysiologist:  None   Chief Complaint:  Follow up.   History of Present Illness:    Savannah [name] is a 63 y.o. female has a history of NICM, PVCs - asymptomatic, no CAD, migraines, obesity and prior shingles.   She has been on ACE and beta blocker. Losartan  changed to lisinopril  due to concerns for possible insomnia.   Had gastric sleeve and cholecystectomy done December 2021   Had left TKR 05/23/20 complicated by arthrofibrosis requiring manipulation under anesthesia Again 08/18/20 may need synovectomy   She works at [employer] in [place] [place] of retiring to [place]  in about 3 years. 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  She is leaning toward not having any more surgery Knee more stable but just not flexible   TTE done 02/22/22 with low normal EF 50-55% normal RV and no significant valve dx Had normal myovue 09/15/18    Past Medical History:  Diagnosis Date   Arthritis    bilateral knees, recent LCL sprain-on left knee   CHF (congestive heart failure) (HCC)    Dysrhythmia    trigem   Environmental allergies    Family history of adverse reaction to anesthesia    slow to wake up   Fatty liver    Headache(784.0)    migraines-on meds for control, relpax , ketoprophen, vicoden, muscle relaxer   Hearing loss 2016   High frequency hearing - Bilateral    History of hiatal hernia    Hot flashes    Joint pain    Lichen sclerosus    Lipoma    Right Knee   Menorrhagia    Migraines    Necrobiosis lipoidica    Osteoarthritis    Overweight    Pelvic pain in female    PONV (postoperative nausea and vomiting)    ponv, slow to awake   Pre-diabetes    Seasonal allergies    Sinus problem    Sprain 10/25/2010   left knee   Trigeminal pulse    Trigger finger, right     Middle finger and right wrist    Vitamin D  deficiency    Past Surgical History:  Procedure Laterality Date   APPENDECTOMY     CHOLECYSTECTOMY     DIAGNOSTIC LAPAROSCOPY  08/2006   DILATION AND CURETTAGE OF UTERUS  2011   attempted ablation   FUNCTIONAL ENDOSCOPIC SINUS SURGERY     ganglion cyst removal     HYSTEROSCOPY     failed   KNEE ARTHROPLASTY Right 2018   meniscus   KNEE CLOSED REDUCTION Left 08/18/2020   Procedure: LEFT KNEE MANIPULATION UNDER ANESTHESIA;  Surgeon: Wes Hamman, MD;  Location: [facility];  Service: Orthopedics;  Laterality: Left;   LAPAROSCOPIC GASTRIC SLEEVE RESECTION  01/2020   LASIK     LIPOMA EXCISION Right 07/22/2014   Procedure: EXCISION RIGHT THIGH LIPOMA;  Surgeon: Sim Dryer, MD;  Location:  SURGERY CENTER;  Service: General;  Laterality: Right;   ROBOTIC ASSISTED TOTAL HYSTERECTOMY  11/06/10   TLH/RSO   SALPINGOOPHORECTOMY  11/06/2010   Procedure: SALPINGO OOPHERECTOMY;  Surgeon: Ammie Bale;  Location: [facility];  Service: Gynecology;  Laterality: Right;   TOTAL KNEE ARTHROPLASTY Left 05/23/2020  Procedure: LEFT TOTAL KNEE ARTHROPLASTY;  Surgeon: Wes Hamman, MD;  [facility]: [facility];  Service: Orthopedics;  Laterality: Left;   TRIGGER FINGER RELEASE     UPPER GI ENDOSCOPY N/A 02/02/2020   Procedure: UPPER GI ENDOSCOPY;  Surgeon: Aldean Hummingbird, MD;  Location: [facility];  Service: General;  Laterality: N/A;     Current Meds  Medication Sig   augmented betamethasone dipropionate (DIPROLENE-AF) 0.05 % ointment Apply 1 Application topically 2 (two) times daily.   Ca Phosphate-Cholecalciferol (CALCIUM 500 + D3) 250-500 MG-UNIT CHEW Chew 1 tablet by mouth in the morning, at noon, and at bedtime. Bariatric Calcium Citrate chewy   calcium carbonate (SUPER CALCIUM) 1500 (600 Ca) MG TABS tablet Take 1,500 mg by mouth daily with breakfast.   carvedilol  (COREG ) 6.25 MG tablet TAKE 1 TABLET TWICE DAILY  WITH MEALS   clobetasol  cream  (TEMOVATE ) 0.05 % Apply 1 Application topically as needed (lichen sclerosus). Use when having a flare.  Do not use for more than 5 days.   eletriptan  (RELPAX ) 40 MG tablet TAKE 1 TABLET TWICE A DAY  AS NEEDED FOR MIGRAINE OR  HEADACHE   furosemide  (LASIX ) 20 MG tablet Take 0.5 tablets (10 mg total) by mouth daily as needed for edema.   Glucosamine 500 MG CAPS Take 1 capsule by mouth daily at 6 (six) AM.   HYDROcodone -acetaminophen  (NORCO/VICODIN) 5-325 MG tablet Take 1 tablet by mouth 3 (three) times daily as needed for moderate pain (pain score 4-6). To be taken after surgery   hydrOXYzine (ATARAX) 10 MG tablet Take 10 mg by mouth at bedtime as needed.   hyoscyamine  (LEVSIN) 0.125 MG tablet Take 0.125 mg by mouth every 4 (four) hours as needed (abdominal cramps/spasms).   lisinopril  (ZESTRIL ) 10 MG tablet TAKE 1 TABLET DAILY.   mometasone  (ELOCON ) 0.1 % ointment Use small topically twice weekly for maintenance.   Multiple Vitamins-Minerals (BARIATRIC MULTIVITAMINS/IRON PO) Take 1 tablet by mouth in the morning. BariMelts Multivitamin W/Iron (chewable)   multivitamin-lutein (OCUVITE-LUTEIN) CAPS capsule Take 1 capsule by mouth in the morning.   nortriptyline  (PAMELOR ) 10 MG capsule Take 1 capsule (10 mg total) by mouth at bedtime.   Omega-3 1000 MG CAPS Take by mouth.   ondansetron  (ZOFRAN ) 4 MG tablet Take 1 tablet (4 mg total) by mouth every 8 (eight) hours as needed for nausea or vomiting.   Semaglutide -Weight Management (WEGOVY ) 0.5 MG/0.5ML SOAJ Inject 0.5 mg into the skin once a week.   tiZANidine  (ZANAFLEX ) 2 MG tablet Take 1 tablet (2 mg total) by mouth every 8 (eight) hours as needed for muscle spasms.     Allergies:   Dust mite extract and Nsaids   Social History   Tobacco Use   Smoking status: Never   Smokeless tobacco: Never  Vaping Use   Vaping status: Never Used  Substance Use Topics   Alcohol use: No    Alcohol/week: 0.0 standard drinks of alcohol   Drug use: No      Family Hx: The patient's family history includes Breast cancer (age of onset: 34) in her maternal aunt and paternal aunt; Heart disease in her father; High Cholesterol in her father; High blood pressure in her father and mother; Hyperlipidemia in her father and mother; Hypertension in her father and mother; Migraines in her brother and mother; Skin cancer in her mother.  ROS:   Please see the history of present illness.   All other systems reviewed are negative.    Objective:  Vital Signs:  BP 102/74   Pulse 95   Ht 5' 4.5" (1.638 m)   Wt 224 lb (101.6 kg)   LMP 10/14/2010   SpO2 96%   BMI 37.86 kg/m    Wt Readings from Last 3 Encounters:  07/05/23 224 lb (101.6 kg)  06/11/23 221 lb (100.2 kg)  05/14/23 221 lb (100.2 kg)   Affect appropriate Healthy:  appears stated age HEENT: normal Neck supple with no adenopathy JVP normal no bruits no thyromegaly Lungs clear with no wheezing and good diaphragmatic motion Heart:  S1/S2 no murmur, no rub, gallop or click PMI normal post lap GB surgery and gastric sleeve Abdomen: benighn, BS positve, no tenderness, no AAA no bruit.  No HSM or HJR Distal pulses intact with no bruits No edema Neuro non-focal Post left TKR    ECG: 07/05/2023  SR rate 83 normal   Labs/Other Tests and Data Reviewed:    Lab Results  Component Value Date   WBC 7.4 03/12/2023   HGB 13.8 03/12/2023   HCT 41.2 03/12/2023   PLT 315 03/12/2023   GLUCOSE 86 03/12/2023   CHOL 203 (H) 03/12/2023   TRIG 91 03/12/2023   HDL 63 03/12/2023   LDLCALC 124 (H) 03/12/2023   ALT 17 03/12/2023   AST 20 03/12/2023   NA 140 03/12/2023   K 4.4 03/12/2023   CL 103 03/12/2023   CREATININE 0.97 03/12/2023   BUN 15 03/12/2023   CO2 23 03/12/2023   TSH 3.220 03/12/2023   INR 1.0 05/17/2020   HGBA1C 5.6 03/12/2023       BNP (last 3 results) No results for input(s): "BNP" in the last 8760 hours.  ProBNP (last 3 results) No results for input(s): "PROBNP" in  the last 8760 hours.    Prior CV studies:    The following studies were reviewed today:  ECHO IMPRESSIONS 09/2019   1. Left ventricular ejection fraction, by estimation, is 50%. The left  ventricle has mildly reduced systolic function. Mild global hypokinesis.  Left ventricular diastolic parameters are consistent with Grade I  diastolic dysfunction (impaired  relaxation). GLS -23.2%, normal.   2. Right ventricular systolic function is normal. The right ventricular  size is normal. Tricuspid regurgitation signal is inadequate for assessing  PA pressure.   3. The aortic valve is tricuspid. Aortic valve regurgitation is not  visualized. No aortic stenosis is present.   4. The mitral valve is normal in structure. No evidence of mitral valve  regurgitation. No evidence of mitral stenosis.   5. The inferior vena cava is normal in size with greater than 50%  respiratory variability, suggesting right atrial pressure of 3 mmHg.   Comparison(s): 09/10/17 EF 45-50%.   Echo 02/22/22   1. Left ventricular ejection fraction, by estimation, is 50 to 55%. Left  ventricular ejection fraction by 3D volume is 51 %. The left ventricle has  low normal function. The left ventricle has no regional wall motion  abnormalities. Left ventricular  diastolic parameters were normal. The average left ventricular global  longitudinal strain is -17.2 %. The global longitudinal strain is normal.   2. Right ventricular systolic function is normal. The right ventricular  size is normal. There is normal pulmonary artery systolic pressure. The  estimated right ventricular systolic pressure is 20.6 mmHg.   3. The mitral valve is normal in structure. Trivial mitral valve  regurgitation. No evidence of mitral stenosis.   4. The aortic valve is tricuspid. Aortic valve regurgitation is  not  visualized. Aortic valve sclerosis is present, with no evidence of aortic  valve stenosis.   5. Aortic dilatation noted. There is  mild dilatation of the ascending  aorta, measuring 38 mm.   6. The inferior vena cava is normal in size with greater than 50%  respiratory variability, suggesting right atrial pressure of 3 mmHg.    Myoview  Study Highlights 09/2018  EF is 51 %. Visually, the EF appears to be greater than 51% This is a low risk study. There is no evidence of ischemia or infarction . The study is normal    ASSESSMENT & PLAN:    1. NICM - last echo 02/22/22 EF 50-55% she is doing well. NYHA I/II - no changes made today. Remains on beta blocker, ACE and prn Lasix .   2. Prior chest pain - normal Myoview  in 2020 and calcium score of 0  resolved  Discussed further risk stratification with calcium score   3. Prior elevated LFTs - normal on labs 03/12/23   4. Obesity - now s/p gastric sleeve - actively losing weight.   5. Ortho:  post left TKR with revision PT/OT Has had dry needling May need synovectomy Discussed SBE prophylaxis She is leaning toward conservative Rx and not having any more surgery   6. Migraines:  f/u neurology exacerbated by work stress continue Relpax    Medication Adjustments/Labs and Tests Ordered: Current medicines are reviewed at length with the patient today.  Concerns regarding medicines are outlined above.   Tests Ordered: No orders of the defined types were placed in this encounter.  Calcium score   Medication Changes: No orders of the defined types were placed in this encounter.    Disposition:  FU with me in a year    Signed, Janelle Mediate, MD  07/05/2023 8:13 AM    [facility]

## 2023-07-03 ENCOUNTER — Ambulatory Visit (INDEPENDENT_AMBULATORY_CARE_PROVIDER_SITE_OTHER): Admitting: Physician Assistant

## 2023-07-03 ENCOUNTER — Encounter: Payer: Self-pay | Admitting: Physician Assistant

## 2023-07-03 DIAGNOSIS — M2021 Hallux rigidus, right foot: Secondary | ICD-10-CM

## 2023-07-03 NOTE — Progress Notes (Signed)
 Post-Op Visit Note   Patient: Savannah [name]           Date of Birth: 1960/11/28           MRN: [id number] Visit Date: 07/03/2023 PCP: Jimmey Mould, MD   Assessment & Plan:  Chief Complaint:  Chief Complaint  Patient presents with   Right Foot - Follow-up    Right great toe cheilectomy 06/20/2023   Visit Diagnoses:  1. Hallux rigidus, right foot     Plan: Patient is a pleasant 63 year old female who comes in today 13 days status post right great toe cheilectomy 06/20/2023.  She has been doing well.  No pain.  She has been compliant wearing a postop shoe.  Examination right foot: Wound is covered with Steri-Strips.  She is neurovascularly intact distally.  Today, her wound was covered with an Ace bandage.  I recommended wearing a carbon fiber plate in her shoe for the next 4 weeks for which she already has at home.  We have provided her with a work note to be able to wear tennis shoes for the next 4 weeks as she tells me this carbon fiber plate causes her foot to come out of her regular work shoes.  Follow-up in 4 weeks for recheck.  Call with concerns or questions. Follow-Up Instructions: Return in about 4 weeks (around 07/31/2023).   Orders:  No orders of the defined types were placed in this encounter.  No orders of the defined types were placed in this encounter.   Imaging: No new imaging  PMFS History: Patient Active Problem List   Diagnosis Date Noted   Difficulty managing dietary regimen 05-31-23   Death of parent 05-May-2023   Pure hypercholesterolemia 03/28/2023   Chronic right shoulder pain 10/23/2022   Great toe pain, right 10/23/2022   Osteopenia 06/28/2022   Arthritis of right knee 06/30/2021   Cardiomyopathy (HCC) 03/10/2021   Stiffness of left knee 08/18/2020   Status post total left knee replacement 05/23/2020   Primary osteoarthritis of left knee 05/05/2020   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   Metabolic dysfunction-associated steatotic  liver disease (MASLD) 04/02/2019   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   Trigger finger, right middle finger 10/10/2018   Other insomnia 06/23/2018   Vitamin D  deficiency 03/26/2018   Prediabetes 03/11/2018   Obesity 02/26/2018   Migraines 10/01/2016   Decreased cardiac ejection fraction 10/01/2016   Severe obesity (HCC) 10/01/2016   Shingles 08/03/2016   Chronic pain of right knee 05/24/2016   Unilateral primary osteoarthritis, right knee 05/24/2016   Posterior tibial tendinitis, right leg 01/30/2016   Lichen sclerosus 12/23/2013   Past Medical History:  Diagnosis Date   Arthritis    bilateral knees, recent LCL sprain-on left knee   CHF (congestive heart failure) (HCC)    Dysrhythmia    trigem   Environmental allergies    Family history of adverse reaction to anesthesia    slow to wake up   Fatty liver    Headache(784.0)    migraines-on meds for control, relpax , ketoprophen, vicoden, muscle relaxer   Hearing loss 2016   High frequency hearing - Bilateral    History of hiatal hernia    Hot flashes    Joint pain    Lichen sclerosus    Lipoma    Right Knee   Menorrhagia    Migraines    Necrobiosis lipoidica    Osteoarthritis    Overweight    Pelvic pain in  female    PONV (postoperative nausea and vomiting)    ponv, slow to awake   Pre-diabetes    Seasonal allergies    Sinus problem    Sprain 10/25/2010   left knee   Trigeminal pulse    Trigger finger, right    Middle finger and right wrist    Vitamin D  deficiency     Family History  Problem Relation Age of Onset   Hyperlipidemia Mother    Hypertension Mother    Skin cancer Mother    Migraines Mother    High blood pressure Mother    Hypertension Father    Hyperlipidemia Father    Heart disease Father    High blood pressure Father    High Cholesterol Father    Migraines Brother    Breast cancer Maternal Aunt 70   Breast cancer Paternal Aunt 34    Past Surgical History:  Procedure  Laterality Date   APPENDECTOMY     CHOLECYSTECTOMY     DIAGNOSTIC LAPAROSCOPY  08/2006   DILATION AND CURETTAGE OF UTERUS  2011   attempted ablation   FUNCTIONAL ENDOSCOPIC SINUS SURGERY     ganglion cyst removal     HYSTEROSCOPY     failed   KNEE ARTHROPLASTY Right 2018   meniscus   KNEE CLOSED REDUCTION Left 08/18/2020   Procedure: LEFT KNEE MANIPULATION UNDER ANESTHESIA;  Surgeon: Wes Hamman, MD;  Location: [facility];  Service: Orthopedics;  Laterality: Left;   LAPAROSCOPIC GASTRIC SLEEVE RESECTION  01/2020   LASIK     LIPOMA EXCISION Right 07/22/2014   Procedure: EXCISION RIGHT THIGH LIPOMA;  Surgeon: Sim Dryer, MD;  Location: [facility];  Service: General;  Laterality: Right;   ROBOTIC ASSISTED TOTAL HYSTERECTOMY  11/06/10   TLH/RSO   SALPINGOOPHORECTOMY  11/06/2010   Procedure: SALPINGO OOPHERECTOMY;  Surgeon: Ammie Bale;  Location: [facility];  Service: Gynecology;  Laterality: Right;   TOTAL KNEE ARTHROPLASTY Left 05/23/2020   Procedure: LEFT TOTAL KNEE ARTHROPLASTY;  Surgeon: Wes Hamman, MD;  Location: [facility];  Service: Orthopedics;  Laterality: Left;   TRIGGER FINGER RELEASE     UPPER GI ENDOSCOPY N/A 02/02/2020   Procedure: UPPER GI ENDOSCOPY;  Surgeon: Aldean Hummingbird, MD;  Location: [facility];  Service: General;  Laterality: N/A;   Social History   Occupational History   Occupation: Regulatory affairs officer  Tobacco Use   Smoking status: Never   Smokeless tobacco: Never  Vaping Use   Vaping status: Never Used  Substance and Sexual Activity   Alcohol use: No    Alcohol/week: 0.0 standard drinks of alcohol   Drug use: No   Sexual activity: Yes    Birth control/protection: Surgical    Comment: TLH/RSO

## 2023-07-05 ENCOUNTER — Ambulatory Visit: Payer: 59 | Attending: Cardiovascular Disease | Admitting: Cardiovascular Disease

## 2023-07-05 ENCOUNTER — Encounter: Payer: Self-pay | Admitting: Cardiovascular Disease

## 2023-07-05 VITALS — BP 102/74 | HR 95 | Ht 64.5 in | Wt 224.0 lb

## 2023-07-05 DIAGNOSIS — I428 Other cardiomyopathies: Secondary | ICD-10-CM

## 2023-07-05 NOTE — Patient Instructions (Signed)

## 2023-07-11 ENCOUNTER — Ambulatory Visit (HOSPITAL_BASED_OUTPATIENT_CLINIC_OR_DEPARTMENT_OTHER): Payer: BC Managed Care – PPO | Admitting: Obstetrics & Gynecology

## 2023-07-15 ENCOUNTER — Encounter: Payer: Self-pay | Admitting: Family Medicine

## 2023-07-15 ENCOUNTER — Ambulatory Visit: Admitting: Family Medicine

## 2023-07-15 VITALS — BP 129/82 | HR 92 | Temp 97.8°F | Ht 64.5 in | Wt 221.0 lb

## 2023-07-15 DIAGNOSIS — Z9884 Bariatric surgery status: Secondary | ICD-10-CM

## 2023-07-15 DIAGNOSIS — E66812 Obesity, class 2: Secondary | ICD-10-CM | POA: Diagnosis not present

## 2023-07-15 DIAGNOSIS — Z6837 Body mass index (BMI) 37.0-37.9, adult: Secondary | ICD-10-CM

## 2023-07-15 DIAGNOSIS — E6609 Other obesity due to excess calories: Secondary | ICD-10-CM

## 2023-07-15 DIAGNOSIS — M1712 Unilateral primary osteoarthritis, left knee: Secondary | ICD-10-CM | POA: Diagnosis not present

## 2023-07-15 DIAGNOSIS — M2021 Hallux rigidus, right foot: Secondary | ICD-10-CM | POA: Diagnosis not present

## 2023-07-15 MED ORDER — WEGOVY 1 MG/0.5ML ~~LOC~~ SOAJ
1.0000 mg | SUBCUTANEOUS | 0 refills | Status: DC
Start: 2023-07-15 — End: 2023-07-22

## 2023-07-15 NOTE — Progress Notes (Signed)
 Office: [phone]  /  Fax: [phone]  WEIGHT SUMMARY AND BIOMETRICS  Starting Date: 03/09/23  Starting Weight: 217lb   Weight Lost Since Last Visit: 0lb   Vitals Temp: 97.8 F (36.6 C) BP: 129/82 Pulse Rate: 92 SpO2: 99 %   Body Composition  Body Fat %: 49.1 % Fat Mass (lbs): 108.6 lbs Muscle Mass (lbs): 106.8 lbs Total Body Water  (lbs): 85.8 lbs Visceral Fat Rating : 15   HPI  Chief Complaint: OBESITY  Savannah [name] is here to discuss her progress with her obesity treatment plan. She is on the keeping a food journal and adhering to recommended goals of 1400 calories and 90 protein and states she is following her eating plan approximately 70-80 % of the time. She states she is walking 6000 steps a day.   Interval History:  Since last office visit she is down 0 lb This gives her a net weight gain of 4 lb in 4 mos She did wait until after foot surgery(R) to start on Wegovy  and has completed 4 injections of 0.5 mg dose Satiety did improve more than Victoza  She is not meal skipping She is focused on getting lean protein She is struggling to get in enough water , adding Propel Exercise has been limited since foot surgery, also hindered by chronic L knee stiffness post L TKR April 2022  Pharmacotherapy: Wegovy  0.5 mg weekly, cash pay  PHYSICAL EXAM:  Blood pressure 129/82, pulse 92, temperature 97.8 F (36.6 C), height 5' 4.5" (1.638 m), weight 221 lb (100.2 kg), last menstrual period 10/14/2010, SpO2 99%. Body mass index is 37.35 kg/m.  General: She is overweight, cooperative, alert, well developed, and in no acute distress. PSYCH: Has normal mood, affect and thought process.   Lungs: Normal breathing effort, no conversational dyspnea.   ASSESSMENT AND PLAN  TREATMENT PLAN FOR OBESITY:  Recommended Dietary Goals  Savannah [name] is currently in the action stage of change. As such, her goal is to continue weight management plan. She has agreed to keeping a food  journal and adhering to recommended goals of 1400 calories and 100 g of  protein.  Behavioral Intervention  We discussed the following Behavioral Modification Strategies today: increasing lean protein intake to established goals, increasing fiber rich foods, increasing water  intake , work on meal planning and preparation, keeping healthy foods at home, identifying sources and decreasing liquid calories, work on managing stress, creating time for self-care and relaxation, avoiding temptations and identifying enticing environmental cues, continue to work on implementation of reduced calorie nutritional plan, better snacking choices, and continue to work on maintaining a reduced calorie state, getting the recommended amount of protein, incorporating whole foods, making healthy choices, staying well hydrated and practicing mindfulness when eating.. Reviewed high protein snack options  Additional resources provided today: NA  Recommended Physical Activity Goals  Savannah [name] has been advised to work up to 150 minutes of moderate intensity aerobic activity a week and strengthening exercises 2-3 times per week for cardiovascular health, weight loss maintenance and preservation of muscle mass.   She has agreed to Work on scheduling and tracking physical activity.   Pharmacotherapy changes for the treatment of obesity: increase Wegovy  to 1 mg weekly  ASSOCIATED CONDITIONS ADDRESSED TODAY  Primary osteoarthritis of left knee Unchanged  She has the ability to flex L knee to 90 degrees She is able to do light walking She is not willing to do water  exercise this summer F/u with orthopedics  Class 2 obesity due to excess calories  with body mass index (BMI) of 37.0 to 37.9 in adult, unspecified whether serious comorbidity present -     Wegovy ; Inject 1 mg into the skin once a week.  Dispense: 2 mL; Refill: 0 Has room to ramp Wegovy , previously needing 2.4 mg for adequate results  S/P laparoscopic sleeve  gastrectomy Improved restriction with Wegovy  Working on lean protein intake  Has room for improvement with reducing intake of sugar and improving intake of water  Continue a MVI daily  Hallux rigidus of right foot Improving s/p surgery Has not been cleared by surgeon to resume exercise yet    She was informed of the importance of frequent follow up visits to maximize her success with intensive lifestyle modifications for her multiple health conditions.   ATTESTASTION STATEMENTS:  Reviewed by clinician on day of visit: allergies, medications, problem list, medical history, surgical history, family history, social history, and previous encounter notes pertinent to obesity diagnosis.   I have personally spent 30 minutes total time today in preparation, patient care, nutritional counseling and education,  and documentation for this visit, including the following: review of most recent clinical lab tests, prescribing medications/ refilling medications, reviewing medical assistant documentation, review and interpretation of bioimpedence results.     [name], D.O. DABFM, DABOM [facility] [address] [place], [place] [address] [phone]

## 2023-07-17 DIAGNOSIS — E66812 Obesity, class 2: Secondary | ICD-10-CM

## 2023-07-22 MED ORDER — WEGOVY 1 MG/0.5ML ~~LOC~~ SOAJ: 1.0000 mg | SUBCUTANEOUS | 0 refills | Status: DC

## 2023-07-31 ENCOUNTER — Other Ambulatory Visit (INDEPENDENT_AMBULATORY_CARE_PROVIDER_SITE_OTHER): Payer: Self-pay

## 2023-07-31 ENCOUNTER — Ambulatory Visit (INDEPENDENT_AMBULATORY_CARE_PROVIDER_SITE_OTHER): Admitting: Physician Assistant

## 2023-07-31 DIAGNOSIS — M79674 Pain in right toe(s): Secondary | ICD-10-CM

## 2023-07-31 NOTE — Progress Notes (Signed)
 Post-Op Visit Note   Patient: Savannah [name]           Date of Birth: 20-Mar-1960           MRN: [id number] Visit Date: 07/31/2023 PCP: Jimmey Mould, MD   Assessment & Plan:  Chief Complaint:  Chief Complaint  Patient presents with   Right Great Toe - Follow-up   Visit Diagnoses:  1. Great toe pain, right     Plan: Patient is a pleasant 63 year old female who comes in today approximately 6 weeks status post right great toe cheilectomy, date of surgery 06/20/2023.  She has been doing well.  She notes occasional pain with walking but nothing more.  She has been wearing a carbon fiber plate in her shoe which she thinks is helping.  She has also been walking barefoot at home without any issues.  Examination right foot reveals a fully healed surgical scar without complication.  She is neurovascularly intact distally.  At this point, she may remove her carbon fiber plate as symptoms allow.  Follow-up in 6 weeks for recheck.  Call with concerns or questions.  Follow-Up Instructions: Return in about 6 weeks (around 09/11/2023).   Orders:  Orders Placed This Encounter  Procedures   XR Toe Great Right   No orders of the defined types were placed in this encounter.   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   Metabolic dysfunction-associated steatotic liver disease (MASLD) 04/02/2019   Persistent  vertigo of central origin 02/10/2019   Trigger finger, right middle finger 10/10/2018   Other insomnia 06/23/2018   Vitamin D  deficiency 03/26/2018   Prediabetes 03/11/2018   Obesity 02/26/2018   Migraines 10/01/2016   Decreased cardiac ejection fraction 10/01/2016   Severe obesity (HCC) 10/01/2016   Shingles 08/03/2016   Chronic pain of right knee 05/24/2016   Unilateral primary osteoarthritis, right knee 05/24/2016   Posterior tibial tendinitis, right leg 01/30/2016   Lichen sclerosus 12/23/2013   Past Medical History:  Diagnosis Date   Arthritis    bilateral knees, recent LCL sprain-on left knee   CHF (congestive heart failure) (HCC)    Dysrhythmia    trigem   Environmental allergies    Family history of adverse reaction to anesthesia    slow to wake up   Fatty liver    Headache(784.0)    migraines-on meds for control, relpax , ketoprophen, vicoden, muscle relaxer   Hearing loss 2016   High frequency hearing - Bilateral    History of hiatal hernia    Hot flashes    Joint pain    Lichen sclerosus    Lipoma    Right Knee   Menorrhagia    Migraines    Necrobiosis lipoidica    Osteoarthritis    Overweight    Pelvic pain in female    PONV (postoperative nausea and vomiting)    ponv, slow to awake   Pre-diabetes  Seasonal allergies    Sinus problem    Sprain 10/25/2010   left knee   Trigeminal pulse    Trigger finger, right    Middle finger and right wrist    Vitamin D  deficiency     Family History  Problem Relation Age of Onset   Hyperlipidemia Mother    Hypertension Mother    Skin cancer Mother    Migraines Mother    High blood pressure Mother    Hypertension Father    Hyperlipidemia Father    Heart disease Father    High blood pressure Father    High Cholesterol Father    Migraines Brother    Breast cancer Maternal Aunt 56   Breast cancer Paternal Aunt 79    Past Surgical History:  Procedure Laterality Date   APPENDECTOMY     CHOLECYSTECTOMY      DIAGNOSTIC LAPAROSCOPY  08/2006   DILATION AND CURETTAGE OF UTERUS  2011   attempted ablation   FUNCTIONAL ENDOSCOPIC SINUS SURGERY     ganglion cyst removal     HYSTEROSCOPY     failed   KNEE ARTHROPLASTY Right 2018   meniscus   KNEE CLOSED REDUCTION Left 08/18/2020   Procedure: LEFT KNEE MANIPULATION UNDER ANESTHESIA;  Surgeon: Wes Hamman, MD;  Location: [facility];  Service: Orthopedics;  Laterality: Left;   LAPAROSCOPIC GASTRIC SLEEVE RESECTION  01/2020   LASIK     LIPOMA EXCISION Right 07/22/2014   Procedure: EXCISION RIGHT THIGH LIPOMA;  Surgeon: Sim Dryer, MD;  Location: [facility];  Service: General;  Laterality: Right;   ROBOTIC ASSISTED TOTAL HYSTERECTOMY  11/06/10   TLH/RSO   SALPINGOOPHORECTOMY  11/06/2010   Procedure: SALPINGO OOPHERECTOMY;  Surgeon: Ammie Bale;  Location: [facility];  Service: Gynecology;  Laterality: Right;   TOTAL KNEE ARTHROPLASTY Left 05/23/2020   Procedure: LEFT TOTAL KNEE ARTHROPLASTY;  Surgeon: Wes Hamman, MD;  Location: [facility];  Service: Orthopedics;  Laterality: Left;   TRIGGER FINGER RELEASE     UPPER GI ENDOSCOPY N/A 02/02/2020   Procedure: UPPER GI ENDOSCOPY;  Surgeon: Aldean Hummingbird, MD;  Location: [facility];  Service: General;  Laterality: N/A;   Social History   Occupational History   Occupation: Regulatory affairs officer  Tobacco Use   Smoking status: Never   Smokeless tobacco: Never  Vaping Use   Vaping status: Never Used  Substance and Sexual Activity   Alcohol use: No    Alcohol/week: 0.0 standard drinks of alcohol   Drug use: No   Sexual activity: Yes    Birth control/protection: Surgical    Comment: TLH/RSO

## 2023-08-08 ENCOUNTER — Telehealth: Admitting: Family Medicine

## 2023-08-08 DIAGNOSIS — M2021 Hallux rigidus, right foot: Secondary | ICD-10-CM

## 2023-08-08 DIAGNOSIS — E66812 Obesity, class 2: Secondary | ICD-10-CM

## 2023-08-08 DIAGNOSIS — Z9884 Bariatric surgery status: Secondary | ICD-10-CM

## 2023-08-08 DIAGNOSIS — R7303 Prediabetes: Secondary | ICD-10-CM

## 2023-08-08 DIAGNOSIS — Z6837 Body mass index (BMI) 37.0-37.9, adult: Secondary | ICD-10-CM | POA: Diagnosis not present

## 2023-08-08 MED ORDER — WEGOVY 1.7 MG/0.75ML ~~LOC~~ SOAJ: 1.7000 mg | SUBCUTANEOUS | 0 refills | Status: DC

## 2023-08-08 NOTE — Progress Notes (Signed)
 Office: [phone]  /  Fax: [phone]  WEIGHT SUMMARY AND BIOMETRICS  No data recorded No data recorded  No data recorded  No data recorded No data recorded  I connected with  Savannah [name] on 08/08/23 by a video and audio enabled telemedicine application and verified that I am speaking with the correct person using two identifiers.  Patient Location: Other:  at office, alone  Provider Location: Office/Clinic  I discussed the limitations of evaluation and management by telemedicine. The patient expressed understanding and agreed to proceed.  HPI  Chief Complaint: OBESITY  Savannah [name] is here to discuss her progress with her obesity treatment plan. She is on the keeping a food journal and adhering to recommended goals of 1400 calories and 90 g of  protein and states she is following her eating plan approximately 70 % of the time. She states she is exercising 0 minutes 0 times per week.  Interval History:  Since last office visit she is down 3 lb She is seen via video visit due to recent viral gastroenteritis She has been catching up on hydration Before getting sick, she was doing more yardwork and focusing more on lean protein intake She has a net weight gain of 1 lb in 5 mos She is tolerating higher dose of Wegovy  at 1 mg weekly x 2 injection Her R foot pain is improving post surgery and she is able to walk more Hunger and cravings well controlled  Pharmacotherapy: Wegovy  1 mg weekly  PHYSICAL EXAM:  Last menstrual period 10/14/2010. There is no height or weight on file to calculate BMI.  General: She is overweight, cooperative, alert, well developed, and in no acute distress. PSYCH: Has normal mood, affect and thought process.   Lungs: Normal breathing effort, no conversational dyspnea.   ASSESSMENT AND PLAN  TREATMENT PLAN FOR OBESITY:  Recommended Dietary Goals  Savannah [name] is currently in the action stage of change. As such, her goal is to continue weight  management plan. She has agreed to keeping a food journal and adhering to recommended goals of 1400 calories and 90 g of  protein and following a lower carbohydrate, vegetable and lean protein rich diet plan.  Behavioral Intervention  We discussed the following Behavioral Modification Strategies today: increasing lean protein intake to established goals, increasing fiber rich foods, increasing water  intake , work on meal planning and preparation, work on Counselling psychologist calories using tracking application, keeping healthy foods at home, practice mindfulness eating and understand the difference between hunger signals and cravings, continue to practice mindfulness when eating, and continue to work on maintaining a reduced calorie state, getting the recommended amount of protein, incorporating whole foods, making healthy choices, staying well hydrated and practicing mindfulness when eating..  Additional resources provided today: NA  Recommended Physical Activity Goals  [name] has been advised to work up to 150 minutes of moderate intensity aerobic activity a week and strengthening exercises 2-3 times per week for cardiovascular health, weight loss maintenance and preservation of muscle mass.   She has agreed to Think about enjoyable ways to increase daily physical activity and overcoming barriers to exercise and Increase physical activity in their day and reduce sedentary time (increase NEAT).  Pharmacotherapy changes for the treatment of obesity: increase Wegovy  to 1.7 mg weekly  ASSOCIATED CONDITIONS ADDRESSED TODAY  Prediabetes Lab Results  Component Value Date   HGBA1C 5.6 03/12/2023   Improved Doing well with resolution of prediabetes Continue to limit added sugar while working on  weight reduction and increase walking time  Class 2 obesity due to excess calories with body mass index (BMI) of 37.0 to 37.9 in adult, unspecified whether serious comorbidity present Tolerating  Wegovy  well without adverse SE or meal skipping Overall, struggling with weight loss post VSG in Dec 2021, down from 283 lb to 187 lb at nadir while on Wegovy  OK to increase next dose of Wegovy  to 1.7 mg weekly, keeping kcal intake at 1400/ day with 90+ g of protein daily  S/P laparoscopic sleeve gastrectomy Improved satiety with gastric sleeve on Wegovy  with a reduction of food noise On a MVI daily Continue to work on small meals, 90+ g of protein intake daily, drinking water  and sugar free electrolytes outside of mealtime  Hallux rigidus of right foot Improving Doing home PT exercises and has been able to walk more as she has healed from surgery Anticipate improved weight loss now that she is able to walk more   She was informed of the importance of frequent follow up visits to maximize her success with intensive lifestyle modifications for her multiple health conditions.   ATTESTASTION STATEMENTS:  Reviewed by clinician on day of visit: allergies, medications, problem list, medical history, surgical history, family history, social history, and previous encounter notes pertinent to obesity diagnosis.   I have personally spent 24 minutes total time today in preparation, patient care, nutritional counseling and education,  and documentation for this visit, including the following: review of most recent clinical lab tests, prescribing medications/ refilling medications, reviewing medical assistant documentation, review and interpretation of bioimpedence results.     Darice Haddock, D.O. DABFM, DABOM [facility] [address] [place], [place] [address] [phone]

## 2023-08-13 ENCOUNTER — Other Ambulatory Visit: Payer: Self-pay | Admitting: Family Medicine

## 2023-08-13 DIAGNOSIS — E6609 Other obesity due to excess calories: Secondary | ICD-10-CM

## 2023-09-10 ENCOUNTER — Encounter: Payer: Self-pay | Admitting: Family Medicine

## 2023-09-10 ENCOUNTER — Ambulatory Visit: Admitting: Family Medicine

## 2023-09-10 VITALS — BP 109/75 | HR 114 | Temp 97.6°F | Ht 64.5 in | Wt 214.0 lb

## 2023-09-10 DIAGNOSIS — M2021 Hallux rigidus, right foot: Secondary | ICD-10-CM | POA: Diagnosis not present

## 2023-09-10 DIAGNOSIS — Z6836 Body mass index (BMI) 36.0-36.9, adult: Secondary | ICD-10-CM

## 2023-09-10 DIAGNOSIS — R197 Diarrhea, unspecified: Secondary | ICD-10-CM | POA: Diagnosis not present

## 2023-09-10 DIAGNOSIS — Z9884 Bariatric surgery status: Secondary | ICD-10-CM

## 2023-09-10 DIAGNOSIS — E6609 Other obesity due to excess calories: Secondary | ICD-10-CM

## 2023-09-10 DIAGNOSIS — E66812 Obesity, class 2: Secondary | ICD-10-CM

## 2023-09-10 MED ORDER — WEGOVY 2.4 MG/0.75ML ~~LOC~~ SOAJ: 2.4000 mg | SUBCUTANEOUS | 0 refills | Status: DC

## 2023-09-10 MED ORDER — CHOLESTYRAMINE 4 G PO PACK: 4.0000 g | PACK | Freq: Every day | ORAL | 2 refills | Status: AC

## 2023-09-10 NOTE — Progress Notes (Signed)
 Office: [phone]  /  Fax: [phone]  WEIGHT SUMMARY AND BIOMETRICS  Starting Date: 03/09/23  Starting Weight: 217lb   Weight Lost Since Last Visit: 7lb   Vitals Temp: 97.6 F (36.4 C) BP: 109/75 Pulse Rate: (!) 114 SpO2: 96 %   Body Composition  Body Fat %: 47.3 % Fat Mass (lbs): 101.6 lbs Muscle Mass (lbs): 107.4 lbs Total Body Water  (lbs): 83.2 lbs Visceral Fat Rating : 14    HPI  Chief Complaint: OBESITY  Savannah [name] is here to discuss her progress with her obesity treatment plan. She is on the keeping a food journal and adhering to recommended goals of 1400 calories and 90 protein and states she is following her eating plan approximately 70 % of the time. She states she is doing yard work and walking laps in her house.   Interval History:  Since last office visit she is down 7 lb She is up 0.6 lb of muscle mass and down 7 lb of body fat since last visit This gives her a net weight loss of 3 lb in 6 mos She has felt improvements in hunger and cravings on Wegovy  1.7 mg weekly Denies adverse SE She is working on hydrating with water  and Propel She is eating less at meals without meal skipping She is getting in lean protein, fruits and veggies She has very few cravings for sweets or salty foods She is back on Questran  for diarrhea (post GB surgery) Walking is still limited due to R foot pain- seeing [facility] tomorrow - post op  Pharmacotherapy: Wegovy  1.7 mg weekly  PHYSICAL EXAM:  Blood pressure 109/75, pulse (!) 114, temperature 97.6 F (36.4 C), height 5' 4.5 (1.638 m), weight 214 lb (97.1 kg), last menstrual period 10/14/2010, SpO2 96%. Body mass index is 36.17 kg/m.  General: She is overweight, cooperative, alert, well developed, and in no acute distress. PSYCH: Has normal mood, affect and thought process.   Lungs: Normal breathing effort, no conversational dyspnea.  ASSESSMENT AND PLAN  TREATMENT PLAN FOR OBESITY:  Recommended Dietary  Goals  Savannah [name] is currently in the action stage of change. As such, her goal is to continue weight management plan. She has agreed to keeping a food journal and adhering to recommended goals of 1400 calories and 90-100 g of  protein and following a lower carbohydrate, vegetable and lean protein rich diet plan. - cut out the sweet tea!  Behavioral Intervention  We discussed the following Behavioral Modification Strategies today: increasing lean protein intake to established goals, increasing fiber rich foods, increasing water  intake , work on meal planning and preparation, keeping healthy foods at home, identifying sources and decreasing liquid calories, work on managing stress, creating time for self-care and relaxation, planning for success, and continue to work on maintaining a reduced calorie state, getting the recommended amount of protein, incorporating whole foods, making healthy choices, staying well hydrated and practicing mindfulness when eating..  Additional resources provided today: NA  Recommended Physical Activity Goals  Savannah [name] has been advised to work up to 150 minutes of moderate intensity aerobic activity a week and strengthening exercises 2-3 times per week for cardiovascular health, weight loss maintenance and preservation of muscle mass.   She has agreed to Think about enjoyable ways to increase daily physical activity and overcoming barriers to exercise and Increase physical activity in their day and reduce sedentary time (increase NEAT).  Pharmacotherapy changes for the treatment of obesity: increase Wegovy  to 2.4 mg weekly injection  ASSOCIATED CONDITIONS  ADDRESSED TODAY  Diarrhea, unspecified type C/o loose stools post cholecystectomy She has some dietary non adherence post VSG and on Wegovy  which is likely worsening diarrhea Currently on Questran  4 mg once daily, reporting constipation if taking more than once daily  Plan: refill Questran  and work on dietary  improvements, hydration with water  and Propel -     Cholestyramine ; Take 1 packet (4 g total) by mouth daily.  Dispense: 60 each;   Class 2 obesity due to excess calories with body mass index (BMI) of 36.0 to 36.9 in adult, unspecified whether serious comorbidity present -     Wegovy ; Inject 2.4 mg into the skin once a week.  Dispense: 3 mL; Refill: 0  S/P laparoscopic sleeve gastrectomy Has improved volume control at mealtime Making an effort to get in lean protein at meals Hydrating well outside of mealtime though is getting in some SSBs Compliant with taking a bariatric MVI daily  Hallux rigidus of right foot R foot pain improving post op but has had some localized edema Has ortho follow up tomorrow for eval and treatment Recommend ice and elevation for 10 min after exercise   She was informed of the importance of frequent follow up visits to maximize her success with intensive lifestyle modifications for her multiple health conditions.   ATTESTASTION STATEMENTS:  Reviewed by clinician on day of visit: allergies, medications, problem list, medical history, surgical history, family history, social history, and previous encounter notes pertinent to obesity diagnosis.   I have personally spent 30 minutes total time today in preparation, patient care, nutritional counseling and education,  and documentation for this visit, including the following: review of most recent clinical lab tests, prescribing medications/ refilling medications, reviewing medical assistant documentation, review and interpretation of bioimpedence results.     Savannah [name], D.O. DABFM, DABOM [facility] [address] [place], [place] [address] [phone]

## 2023-09-11 ENCOUNTER — Encounter: Payer: Self-pay | Admitting: Physician Assistant

## 2023-09-11 ENCOUNTER — Ambulatory Visit (INDEPENDENT_AMBULATORY_CARE_PROVIDER_SITE_OTHER): Admitting: Physician Assistant

## 2023-09-11 DIAGNOSIS — M79674 Pain in right toe(s): Secondary | ICD-10-CM

## 2023-09-11 NOTE — Progress Notes (Signed)
 Post-Op Visit Note   Patient: Savannah [name]           Date of Birth: [birth year]           MRN: [id number] Visit Date: 09/11/2023 PCP: Okey Carlin Redbird, MD   Assessment & Plan:  Chief Complaint:  Chief Complaint  Patient presents with   Right Foot - Follow-up    status post right great toe cheilectomy, date of surgery 06/20/2023   Visit Diagnoses:  1. Great toe pain, right     Plan: Patient is a pleasant 63 year old female who comes in today nearly 3 months status post right great toe cheilectomy.  She has been doing well.  She notes some swelling in the evenings but no complaints of pain or any other issues.  Examination of her right toe reveals no swelling.  She is neurovascularly intact distally.  At this point, she may advance with activity as tolerated.  Follow-up as needed.  Follow-Up Instructions: Return if symptoms worsen or fail to improve.   Orders:  No orders of the defined types were placed in this encounter.  No orders of the defined types were placed in this encounter.   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   Metabolic dysfunction-associated steatotic liver disease (MASLD) 04/02/2019   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   Trigger finger, right middle finger 10/10/2018   Other insomnia 06/23/2018   Vitamin D  deficiency 03/26/2018   Prediabetes 03/11/2018   Obesity 02/26/2018   Migraines 10/01/2016   Decreased  cardiac ejection fraction 10/01/2016   Severe obesity (HCC) 10/01/2016   Shingles 08/03/2016   Chronic pain of right knee 05/24/2016   Unilateral primary osteoarthritis, right knee 05/24/2016   Posterior tibial tendinitis, right leg 01/30/2016   Lichen sclerosus 12/23/2013   Past Medical History:  Diagnosis Date   Arthritis    bilateral knees, recent LCL sprain-on left knee   CHF (congestive heart failure) (HCC)    Dysrhythmia    trigem   Environmental allergies    Family history of adverse reaction to anesthesia    slow to wake up   Fatty liver    Headache(784.0)    migraines-on meds for control, relpax , ketoprophen, vicoden, muscle relaxer   Hearing loss 2016   High frequency hearing - Bilateral    History of hiatal hernia    Hot flashes    Joint pain    Lichen sclerosus    Lipoma    Right Knee   Menorrhagia    Migraines    Necrobiosis lipoidica    Osteoarthritis    Overweight    Pelvic pain in female    PONV (postoperative nausea and vomiting)    ponv, slow to awake   Pre-diabetes    Seasonal allergies    Sinus problem    Sprain 10/25/2010   left knee   Trigeminal pulse    Trigger finger, right    Middle finger and right wrist  Vitamin D  deficiency     Family History  Problem Relation Age of Onset   Hyperlipidemia Mother    Hypertension Mother    Skin cancer Mother    Migraines Mother    High blood pressure Mother    Hypertension Father    Hyperlipidemia Father    Heart disease Father    High blood pressure Father    High Cholesterol Father    Migraines Brother    Breast cancer Maternal Aunt 44   Breast cancer Paternal Aunt 38    Past Surgical History:  Procedure Laterality Date   APPENDECTOMY     CHOLECYSTECTOMY     DIAGNOSTIC LAPAROSCOPY  08/2006   DILATION AND CURETTAGE OF UTERUS  2011   attempted ablation   FUNCTIONAL ENDOSCOPIC SINUS SURGERY     ganglion cyst removal     HYSTEROSCOPY     failed   KNEE ARTHROPLASTY Right 2018    meniscus   KNEE CLOSED REDUCTION Left 08/18/2020   Procedure: LEFT KNEE MANIPULATION UNDER ANESTHESIA;  Surgeon: Jerri Kay HERO, MD;  Location: [facility];  Service: Orthopedics;  Laterality: Left;   LAPAROSCOPIC GASTRIC SLEEVE RESECTION  01/2020   LASIK     LIPOMA EXCISION Right 07/22/2014   Procedure: EXCISION RIGHT THIGH LIPOMA;  Surgeon: Debby Shipper, MD;  Location: [facility];  Service: General;  Laterality: Right;   ROBOTIC ASSISTED TOTAL HYSTERECTOMY  11/06/10   TLH/RSO   SALPINGOOPHORECTOMY  11/06/2010   Procedure: SALPINGO OOPHERECTOMY;  Surgeon: HERO Elvie Pinal;  Location: [facility];  Service: Gynecology;  Laterality: Right;   TOTAL KNEE ARTHROPLASTY Left 05/23/2020   Procedure: LEFT TOTAL KNEE ARTHROPLASTY;  Surgeon: Jerri Kay HERO, MD;  Location: [facility];  Service: Orthopedics;  Laterality: Left;   TRIGGER FINGER RELEASE     UPPER GI ENDOSCOPY N/A 02/02/2020   Procedure: UPPER GI ENDOSCOPY;  Surgeon: Tanda Locus, MD;  Location: [facility];  Service: General;  Laterality: N/A;   Social History   Occupational History   Occupation: Regulatory affairs officer  Tobacco Use   Smoking status: Never   Smokeless tobacco: Never  Vaping Use   Vaping status: Never Used  Substance and Sexual Activity   Alcohol use: No    Alcohol/week: 0.0 standard drinks of alcohol   Drug use: No   Sexual activity: Yes    Birth control/protection: Surgical    Comment: TLH/RSO

## 2023-09-22 ENCOUNTER — Other Ambulatory Visit: Payer: Self-pay | Admitting: Cardiovascular Disease

## 2023-09-26 ENCOUNTER — Other Ambulatory Visit: Payer: Self-pay | Admitting: Family Medicine

## 2023-09-26 DIAGNOSIS — E6609 Other obesity due to excess calories: Secondary | ICD-10-CM

## 2023-10-08 NOTE — Progress Notes (Unsigned)
 PATIENT: Savannah [name] DOB: 12/24/1960  REASON FOR VISIT: follow up HISTORY FROM: patient Primary Neurologist: Dr. Chrissie   Virtual Visit via Video Note  I connected with Savannah [name] on 10/09/23 at  4:00 PM EDT by a video enabled telemedicine application and verified that I am speaking with the correct person using two identifiers.  Location: Patient: at work  Provider: in the office    I discussed the limitations of evaluation and management by telemedicine and the availability of in person appointments. The patient expressed understanding and agreed to proceed.  ASSESSMENT AND PLAN 63 y.o. year old female   1.  Chronic migraine headache  - Increase in migraines, a lot of work stress - Increase nortriptyline  20 mg at bedtime for migraine prevention (will try with her current rx, reach out if working well, then I can send in a new script) - Continue Relpax  40 mg as needed for acute migraine headache, continue tizanidine  as needed for acute headache - Next steps:  CGRP - Previously tried and failed: Effexor , Topamax  - Encouraged to reach out via MyChart if needed, follow-up in 1 year  Meds ordered this encounter  Medications   tiZANidine  (ZANAFLEX ) 2 MG tablet    Sig: Take 1 tablet (2 mg total) by mouth every 8 (eight) hours as needed for muscle spasms.    Dispense:  30 tablet    Refill:  3   HISTORY OF PRESENT ILLNESS: Today 10/09/23  10/09/23 SS: More migraines, from work stress. In the last week has had 3 migraines. During summer doing well 1-2 migraines a month. Has not been taking nortriptyline  regularly. It works well if she takes tizanidine  with it. Taking Relpax  PRN, rarely has to take a 2nd. Her father passed away in 03-26-23. 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   04/02/23 SS: 4 migraines in the last week, more than normal. Tension in her neck, right side. Gets massage once a week. Taking 10 mg at nortriptyline . It helps her get to  sleep. Normally 1 migraine a month. Unclear why more migraines lately. Typically aura, then migraine in 30 minutes. Last week had aura, migraine never developed, but felt cognitively slow for the next day. Relpax  works well, but like this week with frequent migraines, Relpax , rebound. Tizanidine  works well for muscle tension but only gets gets tablets. Just found out her 57 yo Dad's health is declining.   Update 09/19/22 SS: Savannah [name] is here today for follow-up. Is back to work full time. Last few weeks, more headaches. Thinks related to heat, work stress busy season. Takes Relpax  with very good benefit. Not interested in another preventative. Has lost 12 lbs in 2 months with Wegovy . Has a cousin who has been diagnosed with hereditary hematochromatosis, she is having labs checked today. Another cousin diagnosed with malignant hyperthermia. Gets 6 Relpax  tablets a month, doesn't use them all.   Update 09/01/20 SS: Savannah [name] here today to follow-up on migraine headaches, is doing excellent.  She had bariatric surgery in December 2021.  She stopped Effexor  prior to bariatric surgery due to the pill being too large.  Fortunately, she has had very few headaches.  Last week, she did have a cluster of migraines, but Relpax  worked excellent, otherwise no migraines.  She has done well to lose about 60 pounds.  In April 2022, she had a left knee replacement, had scar tissue development, had to have it removed recently.  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  She is not sure if her headaches will increase when she goes back full-time.  Here today for evaluation unaccompanied.  Update 09/01/2019 SS: Savannah [name] is a 63 year old female history of migraine headaches.  She is on Effexor  and Relpax  as needed.  She has previously failed Topamax .  Over the last several weeks, her headaches have increased, likely related to work-related stress.  On average now having 2-3 migraines a week.  She takes Relpax  with good benefit.   She is considering bariatric surgery.  She has chronic left knee pain, needs replacement.  Seeing chiropractor tomorrow.  Presents today for evaluation unaccompanied for  HISTORY 08/26/2018 SS: Savannah [name] is a 63 year old female with history of migraine headaches.  She has failed Topamax  in the past.  She remains on Effexor  75 mg daily, Relpax  as needed. She has had 2 headache in the last 6 weeks. This is a good control for her.  She will take Relpax  and the headaches subside. She says triggers are stress and weather. 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 She is still working from home. Her overall health has been good since last visit. She is in a weight reduction program. She is now down 25 lbs in the last 6 months. She is having surgery for trigger finger next week, planning left knee replacement if she loses more weight. On average she may take 2 relpax  per month. She sees her women's health doctor for primary care.  She presents for follow-up unaccompanied.   REVIEW OF SYSTEMS: Out of a complete 14 system review of symptoms, the patient complains only of the following symptoms, and all other reviewed systems are negative.  See HPI  ALLERGIES: Allergies  Allergen Reactions   Dust Mite Extract Other (See Comments)    Environmental allergies   Nsaids Other (See Comments)    avoided due to bariatric surgery     HOME MEDICATIONS: Outpatient Medications Prior to Visit  Medication Sig Dispense Refill   augmented betamethasone dipropionate (DIPROLENE-AF) 0.05 % ointment Apply 1 Application topically 2 (two) times daily.     Ca Phosphate-Cholecalciferol (CALCIUM 500 + D3) 250-500 MG-UNIT CHEW Chew 1 tablet by mouth in the morning, at noon, and at bedtime. Bariatric Calcium Citrate chewy     calcium carbonate (SUPER CALCIUM) 1500 (600 Ca) MG TABS tablet Take 1,500 mg by mouth daily with breakfast.     carvedilol  (COREG ) 6.25 MG tablet TAKE 1 TABLET TWICE DAILY  WITH MEALS 180 tablet 2    cholestyramine  (QUESTRAN ) 4 g packet Take 1 packet (4 g total) by mouth daily. 60 each 2   clobetasol  cream (TEMOVATE ) 0.05 % Apply 1 Application topically as needed (lichen sclerosus). Use when having a flare.  Do not use for more than 5 days. 60 g 1   eletriptan  (RELPAX ) 40 MG tablet TAKE 1 TABLET TWICE A DAY  AS NEEDED FOR MIGRAINE OR  HEADACHE 6 tablet 11   furosemide  (LASIX ) 20 MG tablet Take 0.5 tablets (10 mg total) by mouth daily as needed for edema. 45 tablet 3   Glucosamine 500 MG CAPS Take 1 capsule by mouth daily at 6 (six) AM.     HYDROcodone -acetaminophen  (NORCO/VICODIN) 5-325 MG tablet Take 1 tablet by mouth 3 (three) times daily as needed for moderate pain (pain score 4-6). To be taken after surgery 21 tablet 0   hydrOXYzine (ATARAX) 10 MG tablet Take 10 mg by mouth at bedtime as needed.  hyoscyamine  (LEVSIN ) 0.125 MG tablet Take 0.125 mg by mouth every 4 (four) hours as needed (abdominal cramps/spasms).     lisinopril  (ZESTRIL ) 10 MG tablet TAKE 1 TABLET DAILY. 90 tablet 2   mometasone  (ELOCON ) 0.1 % ointment Use small topically twice weekly for maintenance. 45 g 3   Multiple Vitamins-Minerals (BARIATRIC MULTIVITAMINS/IRON PO) Take 1 tablet by mouth in the morning. BariMelts Multivitamin W/Iron (chewable)     multivitamin-lutein (OCUVITE-LUTEIN) CAPS capsule Take 1 capsule by mouth in the morning.     nortriptyline  (PAMELOR ) 10 MG capsule Take 1 capsule (10 mg total) by mouth at bedtime. 90 capsule 1   Omega-3 1000 MG CAPS Take by mouth.     ondansetron  (ZOFRAN ) 4 MG tablet Take 1 tablet (4 mg total) by mouth every 8 (eight) hours as needed for nausea or vomiting. 40 tablet 0   Semaglutide -Weight Management (WEGOVY ) 2.4 MG/0.75ML SOAJ Inject 2.4 mg into the skin once a week. 3 mL 0   tiZANidine  (ZANAFLEX ) 2 MG tablet Take 1 tablet (2 mg total) by mouth every 8 (eight) hours as needed for muscle spasms. 30 tablet 3   No facility-administered medications prior to visit.    PAST  MEDICAL HISTORY: Past Medical History:  Diagnosis Date   Arthritis    bilateral knees, recent LCL sprain-on left knee   CHF (congestive heart failure) (HCC)    Dysrhythmia    trigem   Environmental allergies    Family history of adverse reaction to anesthesia    slow to wake up   Fatty liver    Headache(784.0)    migraines-on meds for control, relpax , ketoprophen, vicoden, muscle relaxer   Hearing loss 2016   High frequency hearing - Bilateral    History of hiatal hernia    Hot flashes    Joint pain    Lichen sclerosus    Lipoma    Right Knee   Menorrhagia    Migraines    Necrobiosis lipoidica    Osteoarthritis    Overweight    Pelvic pain in female    PONV (postoperative nausea and vomiting)    ponv, slow to awake   Pre-diabetes    Seasonal allergies    Sinus problem    Sprain 10/25/2010   left knee   Trigeminal pulse    Trigger finger, right    Middle finger and right wrist    Vitamin D  deficiency     PAST SURGICAL HISTORY: Past Surgical History:  Procedure Laterality Date   APPENDECTOMY     CHOLECYSTECTOMY     DIAGNOSTIC LAPAROSCOPY  08/2006   DILATION AND CURETTAGE OF UTERUS  2011   attempted ablation   FUNCTIONAL ENDOSCOPIC SINUS SURGERY     ganglion cyst removal     HYSTEROSCOPY     failed   KNEE ARTHROPLASTY Right 2018   meniscus   KNEE CLOSED REDUCTION Left 08/18/2020   Procedure: LEFT KNEE MANIPULATION UNDER ANESTHESIA;  Surgeon: Jerri Kay HERO, MD;  Location: [facility];  Service: Orthopedics;  Laterality: Left;   LAPAROSCOPIC GASTRIC SLEEVE RESECTION  01/2020   LASIK     LIPOMA EXCISION Right 07/22/2014   Procedure: EXCISION RIGHT THIGH LIPOMA;  Surgeon: Debby Shipper, MD;  Location: [facility];  Service: General;  Laterality: Right;   ROBOTIC ASSISTED TOTAL HYSTERECTOMY  11/06/10   TLH/RSO   SALPINGOOPHORECTOMY  11/06/2010   Procedure: SALPINGO OOPHERECTOMY;  Surgeon: HERO Elvie Pinal;  Location: [facility];  Service:  Gynecology;  Laterality: Right;   TOTAL KNEE ARTHROPLASTY Left 05/23/2020   Procedure: LEFT TOTAL KNEE ARTHROPLASTY;  Surgeon: Jerri Kay HERO, MD;  Location: [facility];  Service: Orthopedics;  Laterality: Left;   TRIGGER FINGER RELEASE     UPPER GI ENDOSCOPY N/A 02/02/2020   Procedure: UPPER GI ENDOSCOPY;  Surgeon: Tanda Locus, MD;  Location: [facility];  Service: General;  Laterality: N/A;    FAMILY HISTORY: Family History  Problem Relation Age of Onset   Hyperlipidemia Mother    Hypertension Mother    Skin cancer Mother    Migraines Mother    High blood pressure Mother    Hypertension Father    Hyperlipidemia Father    Heart disease Father    High blood pressure Father    High Cholesterol Father    Migraines Brother    Breast cancer Maternal Aunt 47   Breast cancer Paternal Aunt 2    SOCIAL HISTORY: Social History   Socioeconomic History   Marital status: Married    Spouse name: Deward Tworek   Number of children: 0   Years of education: 18   Highest education level: Not on file  Occupational History   Occupation: Regulatory affairs officer  Tobacco Use   Smoking status: Never   Smokeless tobacco: Never  Vaping Use   Vaping status: Never Used  Substance and Sexual Activity   Alcohol use: No    Alcohol/week: 0.0 standard drinks of alcohol   Drug use: No   Sexual activity: Yes    Birth control/protection: Surgical    Comment: TLH/RSO  Other Topics Concern   Not on file  Social History Narrative   Lives w/ husband, Deward   Caffeine use: Diet coke girl   Right handed    Social Drivers of Corporate investment banker Strain: Not on file  Food Insecurity: No Food Insecurity (10/23/2019)   Hunger Vital Sign    Worried About Running Out of Food in the Last Year: Never true    Ran Out of Food in the Last Year: Never true  Transportation Needs: Not on file  Physical Activity: Not on file  Stress: Not on file  Social Connections: Not on file  Intimate Partner Violence: Not on  file   PHYSICAL EXAM  There were no vitals filed for this visit.  There is no height or weight on file to calculate BMI.  Generalized: Well developed, in no acute distress  Via video visit, is alert and oriented, speech is clear and concise, facial symmetry noted, moves about freely  DIAGNOSTIC DATA (LABS, IMAGING, TESTING) - I reviewed patient records, labs, notes, testing and imaging myself where available.  Lab Results  Component Value Date   WBC 7.4 03/12/2023   HGB 13.8 03/12/2023   HCT 41.2 03/12/2023   MCV 93 03/12/2023   PLT 315 03/12/2023      Component Value Date/Time   NA 140 03/12/2023 0925   K 4.4 03/12/2023 0925   CL 103 03/12/2023 0925   CO2 23 03/12/2023 0925   GLUCOSE 86 03/12/2023 0925   GLUCOSE 87 05/24/2020 0754   BUN 15 03/12/2023 0925   CREATININE 0.97 03/12/2023 0925   CREATININE 0.85 04/15/2015 1512   CALCIUM 9.7 03/12/2023 0925   PROT 6.8 03/12/2023 0925   ALBUMIN 4.1 03/12/2023 0925   AST 20 03/12/2023 0925   ALT 17 03/12/2023 0925   ALKPHOS 162 (H) 03/12/2023 0925   BILITOT 0.5 03/12/2023 0925   GFRNONAA >60 05/24/2020 9245  GFRAA >60 07/02/2019 1115   Lab Results  Component Value Date   CHOL 203 (H) 03/12/2023   HDL 63 03/12/2023   LDLCALC 124 (H) 03/12/2023   TRIG 91 03/12/2023   CHOLHDL 3.2 03/12/2023   Lab Results  Component Value Date   HGBA1C 5.6 03/12/2023   Lab Results  Component Value Date   VITAMINB12 733 03/12/2023   Lab Results  Component Value Date   TSH 3.220 03/12/2023    Lauraine Born, AGNP-C, DNP 10/09/2023, 4:19 PM [facility] [address] [place], [place] [address] [phone]

## 2023-10-09 ENCOUNTER — Telehealth (INDEPENDENT_AMBULATORY_CARE_PROVIDER_SITE_OTHER): Payer: 59 | Admitting: Neurology

## 2023-10-09 DIAGNOSIS — G43909 Migraine, unspecified, not intractable, without status migrainosus: Secondary | ICD-10-CM

## 2023-10-09 MED ORDER — TIZANIDINE HCL 2 MG PO TABS: 2.0000 mg | ORAL_TABLET | Freq: Three times a day (TID) | ORAL | 3 refills | Status: AC | PRN

## 2023-10-09 NOTE — Patient Instructions (Signed)
 Great to see you today.  Increase nortriptyline  20 mg at bedtime for migraine prevention, can also help with sleep, stress.  Continue Relpax , tizanidine  for acute headache treatment.  Reach out in a few weeks to let me know how you are doing.  Follow-up in 1 year.  Thanks!!

## 2023-10-10 ENCOUNTER — Ambulatory Visit (INDEPENDENT_AMBULATORY_CARE_PROVIDER_SITE_OTHER): Admitting: Family Medicine

## 2023-10-10 ENCOUNTER — Encounter: Payer: Self-pay | Admitting: Family Medicine

## 2023-10-10 VITALS — BP 114/78 | HR 77 | Temp 97.6°F | Ht 64.5 in | Wt 213.0 lb

## 2023-10-10 DIAGNOSIS — R7303 Prediabetes: Secondary | ICD-10-CM

## 2023-10-10 DIAGNOSIS — Z6836 Body mass index (BMI) 36.0-36.9, adult: Secondary | ICD-10-CM

## 2023-10-10 DIAGNOSIS — M2021 Hallux rigidus, right foot: Secondary | ICD-10-CM

## 2023-10-10 DIAGNOSIS — E66812 Obesity, class 2: Secondary | ICD-10-CM | POA: Diagnosis not present

## 2023-10-10 DIAGNOSIS — Z9884 Bariatric surgery status: Secondary | ICD-10-CM

## 2023-10-10 MED ORDER — WEGOVY 2.4 MG/0.75ML ~~LOC~~ SOAJ
2.4000 mg | SUBCUTANEOUS | 1 refills | Status: DC
Start: 2023-10-10 — End: 2023-11-27

## 2023-10-10 NOTE — Progress Notes (Signed)
 Office: [phone]  /  Fax: [phone]  WEIGHT SUMMARY AND BIOMETRICS  Starting Date: 03/09/23  Starting Weight: 217lb   Weight Lost Since Last Visit: 1lb   Vitals Temp: 97.6 F (36.4 C) BP: 114/78 Pulse Rate: 77 SpO2: 98 %   Body Composition  Body Fat %: 48.9 % Fat Mass (lbs): 104.2 lbs Muscle Mass (lbs): 103.2 lbs Total Body Water  (lbs): 84.8 lbs Visceral Fat Rating : 15     HPI  Chief Complaint: OBESITY  Savannah [name] is here to discuss her progress with her obesity treatment plan. She is on the keeping a food journal and adhering to recommended goals of 1400 calories and 90 protein and states she is following her eating plan approximately 0 % of the time. She states she is exercising 20-25 minutes 5 times per week.   Interval History:  Since last office visit she is down 1 lb This gives her a net weight loss of 4 lb in 7 mos of medically supervised weight management  and treatment of weight regain post VSG This is a 1.8% TBW loss Pre op weight from VSG Dec 2021 283--> 220 and then to 187 on Wegovy  in past She is doing well on Wegovy  2.4 mg weekly She has been eating bacon or sausage or an egg and oatmeals in the morning She is snacking less She is eating some fruits and veggies Portion sizes have reduced on Wegovy  Her husband is also going to be working on weight loss R foot pain is improving and she is walking 20 min a a day She did cut out sweet tea She struggles with water  intake and sleep at night  Pharmacotherapy: Wegovy  2.4 mg weekly  PHYSICAL EXAM:  Blood pressure 114/78, pulse 77, temperature 97.6 F (36.4 C), height 5' 4.5 (1.638 m), weight 213 lb (96.6 kg), last menstrual period 10/14/2010, SpO2 98%. Body mass index is 36 kg/m.  General: She is overweight, cooperative, alert, well developed, and in no acute distress. PSYCH: Has normal mood, affect and thought process.   Lungs: Normal breathing effort, no conversational  dyspnea.   ASSESSMENT AND PLAN  TREATMENT PLAN FOR OBESITY:  Recommended Dietary Goals  Thai is currently in the action stage of change. As such, her goal is to continue weight management plan. She has agreed to keeping a food journal and adhering to recommended goals of 1400 calories and 90 g of  protein and following a lower carbohydrate, vegetable and lean protein rich diet plan.  Behavioral Intervention  We discussed the following Behavioral Modification Strategies today: increasing lean protein intake to established goals, increasing fiber rich foods, increasing water  intake , work on meal planning and preparation, work on tracking and journaling calories using tracking application, keeping healthy foods at home, work on managing stress, creating time for self-care and relaxation, avoiding temptations and identifying enticing environmental cues, continue to work on maintaining a reduced calorie state, getting the recommended amount of protein, incorporating whole foods, making healthy choices, staying well hydrated and practicing mindfulness when eating., and increase protein intake, fibrous foods (25 grams per day for women, 30 grams for men) and water  to improve satiety and decrease hunger signals. .  Additional resources provided today: NA  Recommended Physical Activity Goals  Beckey has been advised to work up to 150 minutes of moderate intensity aerobic activity a week and strengthening exercises 2-3 times per week for cardiovascular health, weight loss maintenance and preservation of muscle mass.   She has agreed to  Increase physical activity in their day and reduce sedentary time (increase NEAT). and Start strengthening exercises with a goal of 2-3 sessions a week   Pharmacotherapy changes for the treatment of obesity: none  ASSOCIATED CONDITIONS ADDRESSED TODAY  Hallux rigidus of right foot Improving She has been able to add in 20 min of walking most days of the week  w/o pain She met back with Ronal Grave, PA-C at [facility] 7/30 following R great toe cheilectomy 5/8 and has been cleared for exercise  Class 2 obesity due to excess calories with body mass index (BMI) of 36.0 to 36.9 in adult, unspecified whether serious comorbidity present -     Wegovy ; Inject 2.4 mg into the skin once a week.  Dispense: 3 mL; Refill: 1 She feels improved satiety w/o meal skipping or GI SE on Wegovy  2.4 mg weekly With recent loss of lean body mass, she agrees to increasing lean protein in the morning and adding in resistance training exercises from home  S/P laparoscopic sleeve gastrectomy Improved portion control at mealtime with dietary changes and use of Wegovy  She reports better compliance taking MVI daily and did quit drinking sweet tea since last visit  Prediabetes Lab Results  Component Value Date   HGBA1C 5.6 03/12/2023   Improved.  Will monitor A1c given her hx of prediabetes.     She was informed of the importance of frequent follow up visits to maximize her success with intensive lifestyle modifications for her multiple health conditions.   ATTESTASTION STATEMENTS:  Reviewed by clinician on day of visit: allergies, medications, problem list, medical history, surgical history, family history, social history, and previous encounter notes pertinent to obesity diagnosis.   I have personally spent 31 minutes total time today in preparation, patient care, nutritional counseling and education,  and documentation for this visit, including the following: review of most recent clinical lab tests, prescribing medications/ refilling medications, reviewing medical assistant documentation, review and interpretation of bioimpedence results.     Darice Haddock, D.O. DABFM, DABOM [facility] [address] [place], [place] [address] [phone]

## 2023-10-10 NOTE — Patient Instructions (Signed)
 Add in a protein bar between breakfast and lunch You can have a fresh fruit between lunch and dinner: Apple slices + peanut butter 1 peach + 1 serving of cottage cheese 1 banana  Great job with water  intake!  Aim for 20-30 min of walking daily Think about adding in weight training from home-- bands, push ups, squats, etc

## 2023-11-20 ENCOUNTER — Encounter: Payer: Self-pay | Admitting: Neurology

## 2023-11-21 ENCOUNTER — Other Ambulatory Visit: Payer: Self-pay | Admitting: Obstetrics & Gynecology

## 2023-11-21 DIAGNOSIS — Z1231 Encounter for screening mammogram for malignant neoplasm of breast: Secondary | ICD-10-CM

## 2023-11-21 MED ORDER — NORTRIPTYLINE HCL 10 MG PO CAPS: 20.0000 mg | ORAL_CAPSULE | Freq: Every day | ORAL | 3 refills | Status: AC

## 2023-11-25 ENCOUNTER — Ambulatory Visit: Admitting: Family Medicine

## 2023-11-27 ENCOUNTER — Telehealth (INDEPENDENT_AMBULATORY_CARE_PROVIDER_SITE_OTHER): Admitting: Family Medicine

## 2023-11-27 VITALS — Ht 64.5 in | Wt 206.0 lb

## 2023-11-27 DIAGNOSIS — K760 Fatty (change of) liver, not elsewhere classified: Secondary | ICD-10-CM | POA: Diagnosis not present

## 2023-11-27 DIAGNOSIS — Z9884 Bariatric surgery status: Secondary | ICD-10-CM

## 2023-11-27 DIAGNOSIS — E6609 Other obesity due to excess calories: Secondary | ICD-10-CM

## 2023-11-27 DIAGNOSIS — E66811 Obesity, class 1: Secondary | ICD-10-CM

## 2023-11-27 DIAGNOSIS — Z6834 Body mass index (BMI) 34.0-34.9, adult: Secondary | ICD-10-CM

## 2023-11-27 MED ORDER — WEGOVY 2.4 MG/0.75ML ~~LOC~~ SOAJ: 2.4000 mg | SUBCUTANEOUS | 0 refills | Status: DC

## 2023-11-27 NOTE — Progress Notes (Signed)
 Office: [phone]  /  Fax: [phone]  WEIGHT SUMMARY AND BIOMETRICS  No data recorded No data recorded  No data recorded  No data recorded No data recorded  I connected with  Savannah [name] on 11/27/23 by a video and audio enabled telemedicine application and verified that I am speaking with the correct person using two identifiers.  Patient Location: Other:  in office at work, alone  Provider Location: Office/Clinic  I discussed the limitations of evaluation and management by telemedicine. The patient expressed understanding and agreed to proceed.  HPI  Chief Complaint: OBESITY  Savannah [name] is here to discuss her progress with her obesity treatment plan. She is on the keeping a food journal and adhering to recommended goals of 1400 calories and 90+ g of protein and practicing portion control and making smarter food choices, such as increasing vegetables and decreasing simple carbohydrates and states she is following her eating plan approximately 70 % of the time. She states she is exercising 15 minutes 3 times per week.   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 She has ramped up Wegovy  currently at 2.4 mg once weekly injection, enjoying added satiety without meal skipping or GI side effects She is currently dealing with a head cold She has been averaging 4000 steps a day, limited by chronic right foot pain and chronic left knee pain She is managed by Dr. Everrett for nonischemic cardiomyopathy She has been working longer hours at work  Pharmacotherapy: Wegovy  2.4 mg once weekly injection  PHYSICAL EXAM:  Height 5' 4.5 (1.638 m), weight 206  lb (93.4 kg), last menstrual period 10/14/2010. Body mass index is 34.81 kg/m.  General: She is overweight, cooperative, alert, well developed, and in no acute distress. PSYCH: Has normal mood, affect and thought process.   Lungs: Normal breathing effort, no conversational dyspnea.   ASSESSMENT AND PLAN  TREATMENT PLAN FOR OBESITY:  Recommended Dietary Goals  Savannah [name] is currently in the action stage of change. As such, her goal is to continue weight management plan. She has agreed to keeping a food journal and adhering to recommended goals of 1400 calories and 90+ grams of protein.  Behavioral Intervention  We discussed the following Behavioral Modification Strategies today: increasing lean protein intake to established goals, increasing fiber rich foods, increasing water  intake , work on meal planning and preparation, work on tracking and journaling calories using tracking application, keeping healthy foods at home, avoiding temptations and identifying enticing environmental cues, planning for success, and staying on track while traveling and vacationing.  Additional resources provided today: NA  Recommended Physical Activity Goals  Delores has been advised to work up to 150 minutes of moderate intensity aerobic activity a week and strengthening exercises 2-3 times per week for cardiovascular health, weight loss maintenance and preservation of muscle mass.   She has agreed to Increase the intensity, frequency or duration of aerobic exercises    Pharmacotherapy changes for the treatment of obesity: None  ASSOCIATED CONDITIONS ADDRESSED TODAY  Metabolic dysfunction-associated steatotic liver disease (MASLD)  Fibrosis 4 Score = .99 (Low risk)        Interpretation  for patients with NAFLD          <1.30       -  F0-F1 (Low risk)          1.30-2.67 -  Indeterminate           >2.67      -  F3-F4 (High risk)     Validated for ages 84-65 Fib 4 score low risk without need for  elastography at this point in time Continue to work on weight reduction and insulin  sensitivity      Score is based on outdated labs. ALT, AST, and platelets should all be measured within the last 6 months for an accurate FIB-4 Score   Obesity, Class I, BMI 30-34.9 -     Wegovy ; Inject 2.4 mg into the skin once a week.  Dispense: 3 mL; Refill: 0 Doing well with weight reduction on Wegovy  2.4 mg once weekly injection, more adherent to a reduced calorie high-protein low carbohydrate low sugar diet.  She has room for improvement with regular exercise.  BMI 34.0-34.9,adult  S/P laparoscopic sleeve gastrectomy Continue a bariatric multivitamin and calcium and vitamin D  daily as directed.  Continue to work on small portion sizes, eating on a schedule, prioritizing lean protein intake with meals and snacks, hydrating well between meals.  Her goal is to get back to her previous nadir weight of 187 pounds  Class 2 obesity due to excess calories with body mass index (BMI) of 36.0 to 36.9 in adult, unspecified whether serious comorbidity present Improving with the help of Wegovy      She was informed of the importance of frequent follow up visits to maximize her success with intensive lifestyle modifications for her multiple health conditions.   ATTESTASTION STATEMENTS:  Reviewed by clinician on day of visit: allergies, medications, problem list, medical history, surgical history, family history, social history, and previous encounter notes pertinent to obesity diagnosis.      Darice Haddock, D.O. DABFM, DABOM [facility] [address] [place], [place] [address] [phone]

## 2023-11-28 ENCOUNTER — Ambulatory Visit
Admission: RE | Admit: 2023-11-28 | Discharge: 2023-11-28 | Disposition: A | Discharge status: Home / Self Care | Source: Ambulatory Visit

## 2023-11-28 DIAGNOSIS — Z1231 Encounter for screening mammogram for malignant neoplasm of breast: Secondary | ICD-10-CM

## 2023-11-28 NOTE — Addendum Note (Signed)
 Addended by: WAYLAN PAO E on: 11/28/2023 11:36 AM   Modules accepted: Level of Service

## 2023-12-16 ENCOUNTER — Encounter: Payer: Self-pay | Admitting: Radiology

## 2023-12-30 ENCOUNTER — Encounter: Payer: Self-pay | Admitting: Family Medicine

## 2023-12-30 ENCOUNTER — Ambulatory Visit: Admitting: Family Medicine

## 2023-12-30 VITALS — BP 131/82 | HR 84 | Temp 98.2°F | Ht 64.5 in | Wt 205.0 lb

## 2023-12-30 DIAGNOSIS — E66811 Obesity, class 1: Secondary | ICD-10-CM | POA: Diagnosis not present

## 2023-12-30 DIAGNOSIS — K760 Fatty (change of) liver, not elsewhere classified: Secondary | ICD-10-CM | POA: Diagnosis not present

## 2023-12-30 DIAGNOSIS — Z6834 Body mass index (BMI) 34.0-34.9, adult: Secondary | ICD-10-CM

## 2023-12-30 DIAGNOSIS — Z9884 Bariatric surgery status: Secondary | ICD-10-CM | POA: Diagnosis not present

## 2023-12-30 MED ORDER — WEGOVY 2.4 MG/0.75ML ~~LOC~~ SOAJ: 2.4000 mg | SUBCUTANEOUS | 1 refills | Status: DC

## 2023-12-30 NOTE — Progress Notes (Signed)
 Office: [phone]  /  Fax: [phone]  WEIGHT SUMMARY AND BIOMETRICS  Starting Date: 03/09/23  Starting Weight: 217lb   Weight Lost Since Last Visit: 8lb   Vitals Temp: 98.2 F (36.8 C) BP: 131/82 Pulse Rate: 84 SpO2: 96 %   Body Composition  Body Fat %: 47.8 % Fat Mass (lbs): 98 lbs Muscle Mass (lbs): 101.6 lbs Total Body Water  (lbs): 84 lbs Visceral Fat Rating : 14    HPI  Chief Complaint: OBESITY  Savannah [name] is here to discuss her progress with her obesity treatment plan. She is on the keeping a food journal and adhering to recommended goals of 1400 calories and 90 protein and states she is following her eating plan approximately 70 % of the time. She states she is doing lots of yard work.    Interval History:  Since last office visit she is down 1 lb This gives her a net weight loss 12 lb in 10 mos of medically supervised weight management This is a 5.5% TBW loss She initially gained weight on Victoza  She eats out for lunch on work days (no dressing, veggies and chicken) Her husband cooks dinner and is supportive She seems to digest meat fine She seems to struggle digesting eggs in the morning She has been doing more yardwork and doing home projects to stay active She plans to add in some yoga video She is feeling some improvements with Wegovy  2.4 mg weekly  Pharmacotherapy: Wegovy  2.4 mg weekly  PHYSICAL EXAM:  Blood pressure 131/82, pulse 84, temperature 98.2 F (36.8 C), height 5' 4.5 (1.638 m), weight 205 lb (93 kg), last menstrual period 10/14/2010, SpO2 96%. Body mass index is 34.64 kg/m.  General: She is overweight, cooperative, alert, well developed, and in no acute distress. PSYCH: Has normal mood, affect and thought process.   Lungs: Normal breathing effort, no conversational dyspnea.  ASSESSMENT AND PLAN  TREATMENT PLAN FOR OBESITY:  Recommended Dietary Goals  Savannah [name] is currently in the action stage of change. As such, her goal  is to continue weight management plan. She has agreed to keeping a food journal and adhering to recommended goals of 1400 calories and 80 g of  protein and following a lower carbohydrate, vegetable and lean protein rich diet plan.  Behavioral Intervention  We discussed the following Behavioral Modification Strategies today: increasing lean protein intake to established goals, increasing fiber rich foods, increasing water  intake , work on tracking and journaling calories using tracking application, keeping healthy foods at home, avoiding temptations and identifying enticing environmental cues, and continue to practice mindfulness when eating.  Additional resources provided today: NA  Recommended Physical Activity Goals  Savannah [name] has been advised to work up to 150 minutes of moderate intensity aerobic activity a week and strengthening exercises 2-3 times per week for cardiovascular health, weight loss maintenance and preservation of muscle mass.   She has agreed to [employer] strengthening exercises with a goal of 2-3 sessions a week  Reviewed exercise change goals on AVS  Pharmacotherapy changes for the treatment of obesity: none  ASSOCIATED CONDITIONS ADDRESSED TODAY  Metabolic dysfunction-associated steatotic liver disease (MASLD)  Fibrosis 4 Score = .99 (Low risk)        Interpretation for patients with NAFLD          <1.30       -  F0-F1 (Low risk)          1.30-2.67 -  Indeterminate           >  2.67      -  F3-F4 (High risk)     Validated for ages 61-65 Continue active plan for weight loss Avoid ETOH intake Continue Wegovy       Score is based on outdated labs. ALT, AST, and platelets should all be measured within the last 6 months for an accurate FIB-4 Score   Obesity, Class I, BMI 30-34.9 -     Wegovy ; Inject 2.4 mg into the skin once a week.  Dispense: 3 mL; Refill: 1 Patient was counseled on the importance of maintaining healthy lifestyle habits, including balanced nutrition,  regular physical activity, and behavioral modifications, while taking antiobesity medication.  Patient verbalized understanding that medication is an adjunct to, not a replacement for, lifestyle changes and that the long-term success and weight maintenance depend on continued adherence to these strategies.  BMI 34.0-34.9,adult  S/P laparoscopic sleeve gastrectomy Stable We reviewed eating/ drinking schedule, avoiding high sugar items, adding in more fresh or frozen fruits and veggies, prioritizing lean protein intake and drinking more water  outside of mealtime.  Continue current supplements.  Labs due end of Jan.     She was informed of the importance of frequent follow up visits to maximize her success with intensive lifestyle modifications for her multiple health conditions.   ATTESTASTION STATEMENTS:  Reviewed by clinician on day of visit: allergies, medications, problem list, medical history, surgical history, family history, social history, and previous encounter notes pertinent to obesity diagnosis.   I have personally spent 30 minutes total time today in preparation, patient care, nutritional counseling and education,  and documentation for this visit, including the following: review of most recent clinical lab tests, prescribing medications/ refilling medications, reviewing medical assistant documentation, review and interpretation of bioimpedence results.     Darice Haddock, D.O. DABFM, DABOM [facility] [address] [place], [place] [address] [phone]

## 2023-12-30 NOTE — Patient Instructions (Signed)
 Aim for 1400 cal/ day This should include 80-110 g of protein daily Avoid high sugar foods and drinks Hydrate well with water  Improve sleep to 8 hrs at night Stay active!  Check out You Tube home exercises  Keep up the good work!

## 2024-01-27 NOTE — Telephone Encounter (Signed)
 Wegovy  sent 11/17 for 4 pens has one refill on it.

## 2024-01-30 ENCOUNTER — Other Ambulatory Visit: Payer: Self-pay | Admitting: *Deleted

## 2024-01-30 MED ORDER — ELETRIPTAN HYDROBROMIDE 40 MG PO TABS: ORAL_TABLET | ORAL | 8 refills | Status: AC

## 2024-01-30 NOTE — Telephone Encounter (Signed)
 Last seen on 10/09/23 Follow up scheduled on 10/08/24

## 2024-02-12 ENCOUNTER — Encounter: Payer: Self-pay | Admitting: Family Medicine

## 2024-02-12 ENCOUNTER — Ambulatory Visit: Admitting: Family Medicine

## 2024-02-12 VITALS — BP 122/83 | HR 119 | Ht 64.5 in | Wt 196.0 lb

## 2024-02-12 DIAGNOSIS — Z9884 Bariatric surgery status: Secondary | ICD-10-CM | POA: Diagnosis not present

## 2024-02-12 DIAGNOSIS — E66811 Obesity, class 1: Secondary | ICD-10-CM | POA: Diagnosis not present

## 2024-02-12 DIAGNOSIS — Z6833 Body mass index (BMI) 33.0-33.9, adult: Secondary | ICD-10-CM | POA: Diagnosis not present

## 2024-02-12 DIAGNOSIS — K760 Fatty (change of) liver, not elsewhere classified: Secondary | ICD-10-CM | POA: Diagnosis not present

## 2024-02-12 MED ORDER — WEGOVY 2.4 MG/0.75ML ~~LOC~~ SOAJ: 2.4000 mg | SUBCUTANEOUS | 1 refills | Status: AC

## 2024-02-12 NOTE — Progress Notes (Signed)
 "  Office: [phone]  /  Fax: [phone]  WEIGHT SUMMARY AND BIOMETRICS  Starting Date: 03/09/23  Starting Weight: 217lb   Weight Lost Since Last Visit: 9lb   Vitals BP: 122/83 Pulse Rate: (!) 119 SpO2: 97 %   Body Composition  Body Fat %: 45.1 % Fat Mass (lbs): 88.8 lbs Muscle Mass (lbs): 102.6 lbs Total Body Water  (lbs): 74.2 lbs Visceral Fat Rating : 13     HPI  Chief Complaint: OBESITY  Savannah [name] is here to discuss her progress with her obesity treatment plan. She is on the keeping a food journal and adhering to recommended goals of 1400 calories and 90 protein and states she is following her eating plan approximately 70-75 % of the time. She states she is exercising 25 minutes 4 times per week.  Interval History:  Since last office visit she is down 9 lb She is up 1 lb of muscle mass and down 9.2 lb of body fat since last visit This gives her a net weight loss of 21 lb in the past 11 mos of medically supervised weight management This is a 10% TBW loss She had adequate satiety with Wegovy  2.4 mg without meal skipping or GI upset She is getting in exercise 4 days/ wk, limited some by knee stiffness s/p L TKR  Pharmacotherapy: Wegovy  2.4 mg weekly  PHYSICAL EXAM:  Blood pressure 122/83, pulse (!) 119, height 5' 4.5 (1.638 m), weight 196 lb (88.9 kg), last menstrual period 10/14/2010, SpO2 97%. Body mass index is 33.12 kg/m.  General: She is overweight, cooperative, alert, well developed, and in no acute distress. PSYCH: Has normal mood, affect and thought process.   Lungs: Normal breathing effort, no conversational dyspnea.   ASSESSMENT AND PLAN  TREATMENT PLAN FOR OBESITY:  Recommended Dietary Goals  Savannah [name] is currently in the action stage of change. As such, her goal is to continue weight management plan. She has agreed to keeping a food journal and adhering to recommended goals of 1400 calories and 85 g of  protein and following a lower  carbohydrate, vegetable and lean protein rich diet plan.  Behavioral Intervention  We discussed the following Behavioral Modification Strategies today: increasing lean protein intake to established goals, increasing fiber rich foods, increasing water  intake , work on meal planning and preparation, work on counselling psychologist calories using tracking application, keeping healthy foods at home, work on managing stress, creating time for self-care and relaxation, continue to practice mindfulness when eating, and celebration eating strategies.  Additional resources provided today: NA  Recommended Physical Activity Goals  Savannah [name] has been advised to work up to 150 minutes of moderate intensity aerobic activity a week and strengthening exercises 2-3 times per week for cardiovascular health, weight loss maintenance and preservation of muscle mass.   She has agreed to [employer] strengthening exercises with a goal of 2-3 sessions a week  and Increase the intensity, frequency or duration of aerobic exercises    Pharmacotherapy changes for the treatment of obesity: none  ASSOCIATED CONDITIONS ADDRESSED TODAY  Obesity, Class I, BMI 30-34.9 Improving Maintaining lean body mass while losing body fat on a reduced kcal HPLC diet Ramp up weight training to 2 x a week Patient was counseled on the importance of maintaining healthy lifestyle habits, including balanced nutrition, regular physical activity, and behavioral modifications, while taking antiobesity medication.  Patient verbalized understanding that medication is an adjunct to, not a replacement for, lifestyle changes and that the long-term success and weight maintenance  depend on continued adherence to these strategies.  -     Wegovy ; Inject 2.4 mg into the skin once a week.  Dispense: 3 mL; Refill: 1  BMI 33.0-33.9,adult  S/P laparoscopic sleeve gastrectomy Doing well and approaching previous nadir weight of 187 lb Limiting portion sizes and  getting in lean protein with all meals Taking bariatric MVI, calcium and vitamin D  daily as directed Repeat annual bariatric labs next visit  Metabolic dysfunction-associated steatotic liver disease (MASLD)  Fibrosis 4 Score = .99 (Low risk)        Interpretation for patients with NAFLD          <1.30       -  F0-F1 (Low risk)          1.30-2.67 -  Indeterminate           >2.67      -  F3-F4 (High risk)     Validated for ages 32-65      Score is based on outdated labs. ALT, AST, and platelets should all be measured within the last 6 months for an accurate FIB-4 Score      She was informed of the importance of frequent follow up visits to maximize her success with intensive lifestyle modifications for her multiple health conditions.   ATTESTASTION STATEMENTS:  Reviewed by clinician on day of visit: allergies, medications, problem list, medical history, surgical history, family history, social history, and previous encounter notes pertinent to obesity diagnosis.      Darice Haddock, D.O. DABFM, DABOM [facility] [address] [place], [place] [address] [phone] "

## 2024-03-09 ENCOUNTER — Encounter: Payer: Self-pay | Admitting: Neurology

## 2024-03-24 ENCOUNTER — Ambulatory Visit: Admitting: Family Medicine

## 2024-10-08 ENCOUNTER — Telehealth: Admitting: Neurology
